# Patient Record
Sex: Male | Born: 1965 | Race: Black or African American | Hispanic: No | Marital: Married | Smoking: Former smoker
Health system: Southern US, Community
[De-identification: ages and names within clinical notes are randomized; demographics above are authoritative.]

## PROBLEM LIST (undated history)

## (undated) DIAGNOSIS — E785 Hyperlipidemia, unspecified: Secondary | ICD-10-CM

## (undated) DIAGNOSIS — I480 Paroxysmal atrial fibrillation: Secondary | ICD-10-CM

## (undated) DIAGNOSIS — I10 Essential (primary) hypertension: Secondary | ICD-10-CM

## (undated) DIAGNOSIS — G4733 Obstructive sleep apnea (adult) (pediatric): Secondary | ICD-10-CM

## (undated) DIAGNOSIS — E669 Obesity, unspecified: Secondary | ICD-10-CM

## (undated) DIAGNOSIS — N529 Male erectile dysfunction, unspecified: Secondary | ICD-10-CM

## (undated) DIAGNOSIS — E119 Type 2 diabetes mellitus without complications: Secondary | ICD-10-CM

## (undated) HISTORY — DX: Essential (primary) hypertension: I10

## (undated) HISTORY — DX: Type 2 diabetes mellitus without complications: E11.9

## (undated) HISTORY — PX: CARDIAC CATHETERIZATION: SHX172

## (undated) HISTORY — DX: Hyperlipidemia, unspecified: E78.5

## (undated) HISTORY — DX: Obstructive sleep apnea (adult) (pediatric): G47.33

## (undated) HISTORY — DX: Male erectile dysfunction, unspecified: N52.9

## (undated) HISTORY — PX: FINGER SURGERY: SHX640

## (undated) HISTORY — DX: Obesity, unspecified: E66.9

## (undated) HISTORY — DX: Paroxysmal atrial fibrillation: I48.0

---

## 1998-08-17 ENCOUNTER — Encounter: Admission: RE | Admit: 1998-08-17 | Discharge: 1998-11-15 | Payer: Self-pay

## 1999-06-20 ENCOUNTER — Inpatient Hospital Stay (HOSPITAL_COMMUNITY): Admission: EM | Admit: 1999-06-20 | Discharge: 1999-06-20 | Payer: Self-pay | Admitting: Emergency Medicine

## 1999-06-20 ENCOUNTER — Encounter: Payer: Self-pay | Admitting: Emergency Medicine

## 2004-08-13 ENCOUNTER — Ambulatory Visit: Payer: Self-pay | Admitting: Cardiology

## 2004-08-20 ENCOUNTER — Ambulatory Visit: Payer: Self-pay | Admitting: Cardiology

## 2004-09-02 ENCOUNTER — Ambulatory Visit: Payer: Self-pay | Admitting: Cardiology

## 2004-12-09 ENCOUNTER — Ambulatory Visit: Payer: Self-pay | Admitting: Cardiology

## 2005-04-15 ENCOUNTER — Ambulatory Visit: Payer: Self-pay | Admitting: Cardiology

## 2005-10-08 ENCOUNTER — Ambulatory Visit: Payer: Self-pay | Admitting: Cardiology

## 2005-10-15 ENCOUNTER — Ambulatory Visit: Payer: Self-pay | Admitting: Cardiology

## 2005-12-03 ENCOUNTER — Ambulatory Visit: Payer: Self-pay | Admitting: Cardiology

## 2006-06-01 ENCOUNTER — Ambulatory Visit: Payer: Self-pay | Admitting: Cardiology

## 2007-01-11 ENCOUNTER — Ambulatory Visit: Payer: Self-pay | Admitting: Cardiology

## 2007-01-12 ENCOUNTER — Ambulatory Visit: Payer: Self-pay | Admitting: Cardiology

## 2007-01-12 LAB — CONVERTED CEMR LAB
BUN: 19 mg/dL (ref 6–23)
CO2: 29 meq/L (ref 19–32)
Calcium: 9.1 mg/dL (ref 8.4–10.5)
Chloride: 109 meq/L (ref 96–112)
Creatinine, Ser: 0.8 mg/dL (ref 0.4–1.5)
Digitoxin Lvl: 0.3 ng/mL — ABNORMAL LOW (ref 0.8–2.0)
GFR calc Af Amer: 136 mL/min
GFR calc non Af Amer: 112 mL/min
Glucose, Bld: 104 mg/dL — ABNORMAL HIGH (ref 70–99)
Potassium: 4.1 meq/L (ref 3.5–5.1)
Sodium: 143 meq/L (ref 135–145)

## 2007-06-28 ENCOUNTER — Ambulatory Visit: Payer: Self-pay | Admitting: Cardiology

## 2008-02-23 ENCOUNTER — Ambulatory Visit: Payer: Self-pay | Admitting: Cardiology

## 2008-08-29 DIAGNOSIS — I4891 Unspecified atrial fibrillation: Secondary | ICD-10-CM

## 2008-08-29 DIAGNOSIS — E119 Type 2 diabetes mellitus without complications: Secondary | ICD-10-CM | POA: Insufficient documentation

## 2008-08-29 DIAGNOSIS — G4730 Sleep apnea, unspecified: Secondary | ICD-10-CM

## 2008-08-29 DIAGNOSIS — I10 Essential (primary) hypertension: Secondary | ICD-10-CM | POA: Insufficient documentation

## 2008-08-29 DIAGNOSIS — E785 Hyperlipidemia, unspecified: Secondary | ICD-10-CM

## 2008-08-29 DIAGNOSIS — I480 Paroxysmal atrial fibrillation: Secondary | ICD-10-CM | POA: Insufficient documentation

## 2008-08-29 DIAGNOSIS — E1169 Type 2 diabetes mellitus with other specified complication: Secondary | ICD-10-CM | POA: Insufficient documentation

## 2008-08-30 ENCOUNTER — Ambulatory Visit: Payer: Self-pay | Admitting: Cardiology

## 2008-08-30 ENCOUNTER — Encounter: Payer: Self-pay | Admitting: Cardiology

## 2009-03-21 ENCOUNTER — Ambulatory Visit: Payer: Self-pay | Admitting: Cardiology

## 2009-05-17 ENCOUNTER — Encounter (INDEPENDENT_AMBULATORY_CARE_PROVIDER_SITE_OTHER): Payer: Self-pay | Admitting: *Deleted

## 2009-07-30 ENCOUNTER — Ambulatory Visit: Payer: Self-pay | Admitting: Cardiovascular Disease

## 2009-07-30 ENCOUNTER — Telehealth: Payer: Self-pay | Admitting: Cardiology

## 2009-08-03 ENCOUNTER — Ambulatory Visit: Payer: Self-pay | Admitting: Cardiovascular Disease

## 2009-08-03 ENCOUNTER — Encounter: Payer: Self-pay | Admitting: Cardiology

## 2009-08-03 ENCOUNTER — Inpatient Hospital Stay (HOSPITAL_COMMUNITY): Admission: AD | Admit: 2009-08-03 | Discharge: 2009-08-09 | Payer: Self-pay | Admitting: Cardiology

## 2009-08-03 ENCOUNTER — Ambulatory Visit: Payer: Self-pay | Admitting: Cardiology

## 2009-08-06 ENCOUNTER — Encounter: Payer: Self-pay | Admitting: Cardiology

## 2009-08-10 ENCOUNTER — Ambulatory Visit: Payer: Self-pay | Admitting: Cardiovascular Disease

## 2009-08-10 LAB — CONVERTED CEMR LAB: POC INR: 3.2

## 2009-08-15 ENCOUNTER — Ambulatory Visit: Payer: Self-pay | Admitting: Internal Medicine

## 2009-08-15 LAB — CONVERTED CEMR LAB: POC INR: 3

## 2009-08-22 ENCOUNTER — Ambulatory Visit: Payer: Self-pay | Admitting: Cardiovascular Disease

## 2009-08-22 LAB — CONVERTED CEMR LAB: POC INR: 2.1

## 2009-09-05 ENCOUNTER — Ambulatory Visit: Payer: Self-pay | Admitting: Internal Medicine

## 2009-09-05 LAB — CONVERTED CEMR LAB: POC INR: 2.3

## 2009-09-13 ENCOUNTER — Ambulatory Visit: Payer: Self-pay | Admitting: Cardiology

## 2009-09-17 LAB — CONVERTED CEMR LAB
BUN: 25 mg/dL — ABNORMAL HIGH (ref 6–23)
CO2: 27 meq/L (ref 19–32)
Calcium: 9.4 mg/dL (ref 8.4–10.5)
Chloride: 105 meq/L (ref 96–112)
Creatinine, Ser: 1.1 mg/dL (ref 0.4–1.5)
GFR calc non Af Amer: 92.65 mL/min (ref >60)
Glucose, Bld: 98 mg/dL (ref 70–99)
Potassium: 4.2 meq/L (ref 3.5–5.1)
Sodium: 139 meq/L (ref 135–145)

## 2009-09-26 ENCOUNTER — Ambulatory Visit: Payer: Self-pay | Admitting: Cardiology

## 2009-09-26 LAB — CONVERTED CEMR LAB: POC INR: 2.3

## 2009-10-17 ENCOUNTER — Ambulatory Visit: Payer: Self-pay | Admitting: Internal Medicine

## 2009-10-17 DIAGNOSIS — R0602 Shortness of breath: Secondary | ICD-10-CM | POA: Insufficient documentation

## 2009-10-23 ENCOUNTER — Encounter: Payer: Self-pay | Admitting: Cardiovascular Disease

## 2009-10-25 ENCOUNTER — Telehealth (INDEPENDENT_AMBULATORY_CARE_PROVIDER_SITE_OTHER): Payer: Self-pay | Admitting: *Deleted

## 2009-10-29 ENCOUNTER — Ambulatory Visit: Payer: Self-pay | Admitting: Cardiology

## 2009-10-29 ENCOUNTER — Encounter (HOSPITAL_COMMUNITY): Admission: RE | Admit: 2009-10-29 | Discharge: 2009-12-25 | Payer: Self-pay | Admitting: Internal Medicine

## 2009-10-29 ENCOUNTER — Encounter: Payer: Self-pay | Admitting: Internal Medicine

## 2009-10-29 ENCOUNTER — Ambulatory Visit: Payer: Self-pay

## 2009-10-29 ENCOUNTER — Ambulatory Visit: Payer: Self-pay | Admitting: Internal Medicine

## 2009-10-29 LAB — CONVERTED CEMR LAB: POC INR: 2

## 2009-12-03 ENCOUNTER — Ambulatory Visit: Payer: Self-pay | Admitting: Internal Medicine

## 2009-12-03 LAB — CONVERTED CEMR LAB: POC INR: 1.6

## 2009-12-24 ENCOUNTER — Ambulatory Visit: Payer: Self-pay | Admitting: Cardiovascular Disease

## 2009-12-24 LAB — CONVERTED CEMR LAB: POC INR: 2.1

## 2010-01-11 ENCOUNTER — Ambulatory Visit: Payer: Self-pay | Admitting: Cardiology

## 2010-01-21 ENCOUNTER — Ambulatory Visit: Payer: Self-pay | Admitting: Cardiology

## 2010-01-21 LAB — CONVERTED CEMR LAB: POC INR: 1.4

## 2010-01-30 ENCOUNTER — Ambulatory Visit: Payer: Self-pay

## 2010-01-30 ENCOUNTER — Ambulatory Visit: Payer: Self-pay | Admitting: Internal Medicine

## 2010-02-04 ENCOUNTER — Ambulatory Visit: Payer: Self-pay | Admitting: Internal Medicine

## 2010-02-04 LAB — CONVERTED CEMR LAB: POC INR: 3.6

## 2010-02-25 ENCOUNTER — Ambulatory Visit: Payer: Self-pay | Admitting: Internal Medicine

## 2010-02-25 LAB — CONVERTED CEMR LAB: POC INR: 2.9

## 2010-03-25 ENCOUNTER — Ambulatory Visit: Payer: Self-pay | Admitting: Cardiology

## 2010-03-25 LAB — CONVERTED CEMR LAB: POC INR: 2.5

## 2010-03-26 ENCOUNTER — Telehealth (INDEPENDENT_AMBULATORY_CARE_PROVIDER_SITE_OTHER): Payer: Self-pay | Admitting: *Deleted

## 2010-03-27 ENCOUNTER — Encounter: Payer: Self-pay | Admitting: Cardiology

## 2010-04-09 ENCOUNTER — Encounter: Payer: Self-pay | Admitting: Internal Medicine

## 2010-04-09 ENCOUNTER — Ambulatory Visit: Payer: Self-pay | Admitting: Cardiology

## 2010-04-09 DIAGNOSIS — Z87448 Personal history of other diseases of urinary system: Secondary | ICD-10-CM | POA: Insufficient documentation

## 2010-04-22 ENCOUNTER — Ambulatory Visit: Payer: Self-pay | Admitting: Cardiology

## 2010-04-22 LAB — CONVERTED CEMR LAB
INR: 2.3
POC INR: 2.3

## 2010-05-03 ENCOUNTER — Ambulatory Visit: Payer: Self-pay | Admitting: Internal Medicine

## 2010-05-21 ENCOUNTER — Ambulatory Visit: Payer: Self-pay | Admitting: Cardiology

## 2010-05-22 LAB — CONVERTED CEMR LAB: POC INR: 1.5

## 2010-06-13 ENCOUNTER — Ambulatory Visit: Admission: RE | Admit: 2010-06-13 | Discharge: 2010-06-13 | Payer: Self-pay | Source: Home / Self Care

## 2010-06-13 LAB — CONVERTED CEMR LAB: POC INR: 3

## 2010-06-25 NOTE — Medication Information (Signed)
Summary: rov/tm  Anticoagulant Therapy  Managed by: Weston Brass, PharmD Referring MD: J.Allred/T.Riley Kill PCP: [facility] Supervising MD: Antoine Poche MD, Fayrene Fearing Indication 1: Atrial Fibrillation Lab Used: [facility] [place] Site: [address] INR POC 2.0 INR RANGE 2.0-3.0  Dietary changes: no    Health status changes: no    Bleeding/hemorrhagic complications: no    Recent/future hospitalizations: no    Any changes in medication regimen? no    Recent/future dental: no  Any missed doses?: no       Is patient compliant with meds? yes       Allergies: No Known Drug Allergies  Anticoagulation Management History:      The patient is taking warfarin and comes in today for a routine follow up visit.  Positive risk factors for bleeding include presence of serious comorbidities.  Negative risk factors for bleeding include an age less than 50 years old.  The bleeding index is 'intermediate risk'.  Positive CHADS2 values include History of HTN and History of Diabetes.  Negative CHADS2 values include Age > 31 years old.  Anticoagulation responsible provider: Antoine Poche MD, Fayrene Fearing.  INR POC: 2.0.  Cuvette Lot#: [id number].  Exp: 12/2010.    Anticoagulation Management Assessment/Plan:      The patient's current anticoagulation dose is Warfarin sodium 10 mg tabs: Take as directed by coumadin clinic..  The target INR is 2.0-3.0.  The next INR is due 11/28/2009.  Anticoagulation instructions were given to patient.  Results were reviewed/authorized by Weston Brass, PharmD.  He was notified by Weston Brass PharmD.         Prior Anticoagulation Instructions: INR 2.3 Continue 10mg  everyday except 7.5mg  on Wednesdays. Recheck in 4 weeks.l   Current Anticoagulation Instructions: INR 2.0  Continue same dose of 10mg  every day except 7.5mg  on Wednesday

## 2010-06-25 NOTE — Medication Information (Signed)
Summary: rov/ewj  Anticoagulant Therapy  Managed by: Geoffry Paradise, PharmD Referring MD: Hillis Range, MD PCP: [facility] Supervising MD: Riley Kill MD, Maisie Fus Indication 1: Atrial Fibrillation Lab Used: [facility] [place] Site: [address] INR POC 2.3 INR RANGE 2.0-3.0  Dietary changes: no    Health status changes: no    Bleeding/hemorrhagic complications: no    Recent/future hospitalizations: no    Any changes in medication regimen? no    Recent/future dental: no  Any missed doses?: no       Is patient compliant with meds? yes       Allergies: No Known Drug Allergies  Anticoagulation Management History:      Positive risk factors for bleeding include presence of serious comorbidities.  Negative risk factors for bleeding include an age less than 81 years old.  The bleeding index is 'intermediate risk'.  Positive CHADS2 values include History of HTN and History of Diabetes.  Negative CHADS2 values include Age > 31 years old.  Today's INR is 2.3.  Anticoagulation responsible provider: Riley Kill MD, Maisie Fus.  INR POC: 2.3.  Cuvette Lot#: [id number].  Exp: 04/2011.    Anticoagulation Management Assessment/Plan:      The patient's current anticoagulation dose is Warfarin sodium 10 mg tabs: Take as directed by coumadin clinic..  The target INR is 2.0-3.0.  The next INR is due 05/21/2010.  Anticoagulation instructions were given to patient.  Results were reviewed/authorized by Geoffry Paradise, PharmD.         Prior Anticoagulation Instructions: INR 2.5  Continue on same dosage 10mg  daily.  Recheck in 4 weeks.   Current Anticoagulation Instructions: INR: 2.3  Your INR is within goal today.  Please continue to take 10 mg (1 tablet) every day and return for another INR check in 4 weeks.

## 2010-06-25 NOTE — Medication Information (Signed)
Summary: rov/tm  Anticoagulant Therapy  Managed by: Bethena Midget, RN, BSN Referring MD: Hillis Range, MD PCP: [facility] Supervising MD: Tenny Craw MD, Gunnar Fusi Indication 1: Atrial Fibrillation Lab Used: [facility] [place] Site: [address] INR POC 3.6 INR RANGE 2.0-3.0  Dietary changes: no    Health status changes: no    Bleeding/hemorrhagic complications: no    Recent/future hospitalizations: no    Any changes in medication regimen? no    Recent/future dental: no  Any missed doses?: no       Is patient compliant with meds? yes      Comments: Pt did admitted that prior to  last visit he did miss some doses.  Allergies: No Known Drug Allergies  Anticoagulation Management History:      The patient is taking warfarin and comes in today for a routine follow up visit.  Positive risk factors for bleeding include presence of serious comorbidities.  Negative risk factors for bleeding include an age less than 35 years old.  The bleeding index is 'intermediate risk'.  Positive CHADS2 values include History of HTN and History of Diabetes.  Negative CHADS2 values include Age > 69 years old.  Anticoagulation responsible provider: Tenny Craw MD, Gunnar Fusi.  INR POC: 3.6.  Cuvette Lot#: [id number].  Exp: 03/2011.    Anticoagulation Management Assessment/Plan:      The patient's current anticoagulation dose is Warfarin sodium 10 mg tabs: Take as directed by coumadin clinic..  The target INR is 2.0-3.0.  The next INR is due 02/25/2010.  Anticoagulation instructions were given to patient.  Results were reviewed/authorized by Bethena Midget, RN, BSN.  He was notified by Harrel Carina, PharmD candidate.         Prior Anticoagulation Instructions: INR 1.4 Today take 1.5 pills then Change dose to 1pill everyday except 1.5 pills on Wednesdays. Recheck in 2 weeks.   Current Anticoagulation Instructions: INR 3.6  Today take 1/2 pill then change dose to 1 pill everyday. Recheck in 3 weeks.

## 2010-06-25 NOTE — Medication Information (Signed)
Summary: rov/ewj  Anticoagulant Therapy  Managed by: Cloyde Reams, RN, BSN Referring MD: Hillis Range, MD PCP: [facility] Supervising MD: Eden Emms MD, Theron Arista Indication 1: Atrial Fibrillation Lab Used: [facility] [place] Site: [address] INR POC 2.1 INR RANGE 2.0-3.0   Health status changes: no    Bleeding/hemorrhagic complications: no    Recent/future hospitalizations: no    Any changes in medication regimen? no    Recent/future dental: no  Any missed doses?: no       Is patient compliant with meds? yes       Allergies: No Known Drug Allergies  Anticoagulation Management History:      The patient is taking warfarin and comes in today for a routine follow up visit.  Positive risk factors for bleeding include presence of serious comorbidities.  Negative risk factors for bleeding include an age less than 51 years old.  The bleeding index is 'intermediate risk'.  Positive CHADS2 values include History of HTN and History of Diabetes.  Negative CHADS2 values include Age > 71 years old.  Anticoagulation responsible provider: Eden Emms MD, Theron Arista.  INR POC: 2.1.  Cuvette Lot#: [id number].  Exp: 02/2011.    Anticoagulation Management Assessment/Plan:      The patient's current anticoagulation dose is Warfarin sodium 10 mg tabs: Take as directed by coumadin clinic..  The target INR is 2.0-3.0.  The next INR is due 01/21/2010.  Anticoagulation instructions were given to patient.  Results were reviewed/authorized by Cloyde Reams, RN, BSN.  He was notified by Cloyde Reams RN.         Prior Anticoagulation Instructions: INR 2.0  Continue same dose of 10mg  every day except 7.5mg  on Wednesday  Current Anticoagulation Instructions: INR 2.1  Continue on same dosage 10mg  daily.  Recheck in 4 weeks.

## 2010-06-25 NOTE — Letter (Signed)
Summary: [employer] Certification   [employer] Certification   Imported By: Roderic Ovens 12/07/2009 12:41:37  _____________________________________________________________________  External Attachment:    Type:   Image     Comment:   External Document

## 2010-06-25 NOTE — Progress Notes (Signed)
Summary: Hematuria  Phone Note Call from Patient Call back at [facility] Phone [phone]   Summary of Call: Returned call concerning blood in urine.  Today pt has experience dark urine initially but now with what appears to be blood.  Pt has already taken his coumadin for the evening.  Denies any SOB, dizziness, or DOE.  Also denies any pain with urination.  Asked pt to call coumadin clinic in am for rechech INR/CBC, even though yesterday INR was therapuetic.  Pt agrees.  If patient begins to display any syptomas as listed above he will report to ER.   Initial call taken by: Robbi Garter NP-PA,  March 26, 2010 10:18 PM     Appended Document: Hematuria Called patient to check up on status.  He went to [facility] and had a UA done.  This revealed a UTI.  He got treated and it has resolved.  I recommended he get a followup UA in next few days with his primary to make sure there is not microcopic change.  Patient voiced understanding of principles. TS

## 2010-06-25 NOTE — Assessment & Plan Note (Signed)
Summary: PER CHECK OUT/SF   Visit Type:  Follow-up Referring Provider:  Dr Riley Kill Primary Provider:  [facility]   History of Present Illness: The patient presents today for routine electrophysiology followup. He reports doing very well since last being seen in our clinic. The patient denies symptoms of palpitations, chest pain, shortness of breath, orthopnea, PND, lower extremity edema, dizziness, presyncope, syncope, or neurologic sequela.  He feels that his exercise tolerance has improved.  The patient is tolerating medications without difficulties and is otherwise without complaint today.   Current Medications (verified): 1)  Multivitamins  Tabs (Multiple Vitamin) .... Take 1 Tablet By Mouth Once A Day 2)  Simvastatin 40 Mg Tabs (Simvastatin) .... Take One Tablet By Mouth Daily At Bedtime 3)  Aspirin Ec 325 Mg Tbec (Aspirin) .... Take One Tablet By Mouth Daily 4)  Metformin Hcl 500 Mg Xr24h-Tab (Metformin Hcl) .... Take Two Tablets Once Daily 5)  Metoprolol Succinate 100 Mg Xr24h-Tab (Metoprolol Succinate) .... Take One Tablet By Mouth Daily 6)  Flonase 50 Mcg/act Susp (Fluticasone Propionate) .... As Needed 7)  Viagra 50 Mg Tabs (Sildenafil Citrate) .... Take As Directed 8)  Digoxin 0.125 Mg Tabs (Digoxin) .... Take 1 Tablet By Mouth Once A Day 9)  Amlodipine Besylate 10 Mg Tabs (Amlodipine Besylate) .... Take 1 Tablet By Mouth Once A Day 10)  Warfarin Sodium 10 Mg Tabs (Warfarin Sodium) .... Take As Directed By Coumadin Clinic. 11)  Hydrochlorothiazide 12.5 Mg Caps (Hydrochlorothiazide) .... Take 1 Tablet By Mouth Once A Day 12)  Benazepril Hcl 20 Mg Tabs (Benazepril Hcl) .... Take 1 Tablet By Mouth Once A Day 13)  Flecainide Acetate 100 Mg Tabs (Flecainide Acetate) .... One By Mouth Bid  Allergies (verified): No Known Drug Allergies  Past History:  Past Medical History: HYPERCHOLESTEROLEMIA (ICD-272.0) SLEEP APNEA (ICD-780.57) HYPERTENSION, MODERATE (ICD-401.9) AODM  (ICD-250.00) ATRIAL FIBRILLATION, PAROXYSMAL  OBESITY, MORBID (ICD-278.01) Normal cath 2001 erectile dysfunction  Past Surgical History: Reviewed history from 08/29/2008 and no changes required. Left 1st. finger - 1989  Social History: Reviewed history from 10/17/2009 and no changes required. 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 Mouth:  Teeth, gums and palate normal. Oral mucosa normal. Neck:  Neck supple, no JVD. No masses, thyromegaly or abnormal cervical nodes. Lungs:  Clear bilaterally to auscultation and percussion. Heart:  Non-displaced PMI, chest non-tender; regular rate and rhythm, S1, S2 without murmurs, rubs or gallops. Carotid upstroke normal, no bruit. Normal abdominal aortic size, no bruits. Femorals normal pulses, no bruits. Pedals normal pulses. No edema, no varicosities. Abdomen:  Bowel sounds positive; abdomen soft and non-tender without masses, organomegaly, or hernias noted. No hepatosplenomegaly. Msk:  Back normal, normal gait. Muscle strength and tone normal. Pulses:  pulses normal in all 4 extremities Extremities:  No clubbing or cyanosis. Neurologic:  Alert and oriented x 3. Skin:  Intact without lesions or rashes. Cervical Nodes:  no significant adenopathy Psych:  Normal  affect.   EKG  Procedure date:  12/03/2009  Findings:      sinus rhythm 60 bpm, PR 186, QRS 94, Qt 410, otherwise normal ekg  Impression & Recommendations:  Problem # 1:  ATRIAL FIBRILLATION, PAROXYSMAL, CHRONIC (ICD-427.31) The patient has converted to  sinus rhythm with flecainide.  He is tolerating this medicine without difficulty.  His CHADS2 score  is 1.  We will continue coumadin at this time. We will obtain a GXT to evaluate for exercise induced arrhythmias.  Problem # 2:  HYPERTENSION, MODERATE (ICD-401.9) stable no changes today  Problem # 3:  OBESITY, MORBID (ICD-278.01) weight loss advised  Problem # 4:  SLEEP APNEA (ICD-780.57) compliance with CPAP advised  Other Orders: Treadmill (Treadmill)  Patient Instructions: 1)  Your physician recommends that you schedule a follow-up appointment in: treadmill with Dr Johney Frame in 6-8 weeks

## 2010-06-25 NOTE — Miscellaneous (Signed)
Summary: update med  Clinical Lists Changes  Medications: Added new medication of VIAGRA 50 MG TABS (SILDENAFIL CITRATE) Take as directed

## 2010-06-25 NOTE — Assessment & Plan Note (Signed)
Summary: afib/lwb   Visit Type:  Initial Consult Referring Provider:  Dr Riley Kill Primary Provider:  [facility]   History of Present Illness: Mr Daniel Grant is a pleasant 45 yo AAM with a h/o persistent atrial fibrillation and obesity who presents today for EP consultation.  He reports initially being diagnosed with atrial fibrillation1997 after presenting with palpitations and CP to [facility].  He was initiated on cardizem and digoxin and did well without further symptoms of afib until recently.  He reports that in early March 2011, he reports progressive symptoms of fatigue, palpitations, SOB, and decreased exercise tolerance.  He was admitted to [facility] and cardioverted to sinus rhythm.  He was not placed on an antiarrhythmic medicine.  Unfortunately, he returned to atrial fibrillation several weeks later.  He now presents for EP consultation regarding further therapies.  He is unaware of triggers/ precipitants for his afib.  He has sleep apnea and reports reasonable compliance with CPAP.  Current Medications (verified): 1)  Multivitamins  Tabs (Multiple Vitamin) .... Take 1 Tablet By Mouth Once A Day 2)  Simvastatin 40 Mg Tabs (Simvastatin) .... Take One Tablet By Mouth Daily At Bedtime 3)  Aspirin Ec 325 Mg Tbec (Aspirin) .... Take One Tablet By Mouth Daily 4)  Metformin Hcl 500 Mg Xr24h-Tab (Metformin Hcl) .... Take Two Tablets Once Daily 5)  Metoprolol Succinate 100 Mg Xr24h-Tab (Metoprolol Succinate) .... Take One Tablet By Mouth Daily 6)  Flonase 50 Mcg/act Susp (Fluticasone Propionate) .... As Needed 7)  Viagra 50 Mg Tabs (Sildenafil Citrate) .... Take As Directed 8)  Digoxin 0.125 Mg Tabs (Digoxin) .... Take 1 Tablet By Mouth Once A Day 9)  Amlodipine Besylate 10 Mg Tabs (Amlodipine Besylate) .... Take 1 Tablet By Mouth Once A Day 10)  Warfarin Sodium 10 Mg Tabs (Warfarin Sodium) .... Take As Directed By Coumadin Clinic. 11)  Hydrochlorothiazide 12.5 Mg Caps  (Hydrochlorothiazide) .... Take 1 Tablet By Mouth Once A Day 12)  Benazepril Hcl 20 Mg Tabs (Benazepril Hcl) .... Take 1 Tablet By Mouth Once A Day  Allergies (verified): No Known Drug Allergies  Past History:  Past Medical History: HYPERCHOLESTEROLEMIA (ICD-272.0) SLEEP APNEA (ICD-780.57) HYPERTENSION, MODERATE (ICD-401.9) AODM (ICD-250.00) ATRIAL FIBRILLATION, PAROXYSMAL, CHRONIC (ICD-427.31) OBESITY, MORBID (ICD-278.01) Normal cath 2001 erectile dysfunction  Past Surgical History: Reviewed history from 08/29/2008 and no changes required. Left 1st. finger - 1989  Family History: Reviewed history from 08/29/2008 and no changes required. His family history is remarkable for a grandmother who died with a myocardial infarctation and an uncle who died with an MI at age 15. His mother died at age 16 of a shotgun wound.. He does not have brothers and sisters.  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   Vital Signs:  Patient profile:   45 year old male Height:      70 inches Weight:      269 pounds BMI:     38.74 Pulse rate:   103 / minute BP sitting:   116 / 78  (left arm)  Vitals Entered By: Laurance Flatten CMA (Oct 17, 2009 2:57 PM)  Physical Exam  General:  obese, NAD Head:  normocephalic and atraumatic Eyes:  PERRLA/EOM intact; conjunctiva and lids normal. Nose:  no deformity, discharge, inflammation, or  lesions Mouth:  Teeth, gums and palate normal. Oral mucosa normal. Neck:  Neck supple, no JVD. No masses, thyromegaly or abnormal cervical nodes. Lungs:  Clear bilaterally to auscultation and percussion. Heart:  iRRR, no m/r/g Abdomen:  Bowel sounds positive; abdomen soft and non-tender without masses, organomegaly, or hernias noted. No hepatosplenomegaly. Msk:  Back normal, normal gait. Muscle strength and  tone normal. Pulses:  pulses normal in all 4 extremities Extremities:  No clubbing or cyanosis. Neurologic:  Alert and oriented x 3. Skin:  Intact without lesions or rashes. Cervical Nodes:  no significant adenopathy Psych:  Normal affect.   Echocardiogram  Procedure date:  08/06/2009  Findings:       Study Conclusions  Left ventricle: The cavity size was normal. There was mild  concentric hypertrophy. Systolic function was mildly reduced. The  estimated ejection fraction was 50%. - Aortic valve: There was no stenosis.  - Aorta: Normal caliber, minimal plaque.  - Mitral valve: Mild regurgitation.  - Left atrium: The atrium was mildly dilated. No evidence of    thrombus in the atrial cavity or appendage.  - Right ventricle: The cavity size was normal. Systolic function was  normal.  - Right atrium: The atrium was mildly dilated.   Impressions: May proceed with cardioversion.  Transesophageal echocardiography. 2D and color Doppler. Patient  status: Inpatient. Location: Endoscopy. Left ventricle: The cavity size was normal. There was mild  concentric hypertrophy. Systolic function was mildly reduced. The  estimated ejection fraction was 50%.  Aortic valve: Trileaflet. Doppler: There was no stenosis. No  regurgitation.  Aorta: Normal caliber, minimal plaque. Mitral valve:  Doppler: Mild regurgitation.  Left atrium: The atrium was mildly dilated. No evidence of thrombus  in the atrial cavity or appendage.  Right ventricle: The cavity size was normal. Systolic function was  normal.  Tricuspid valve:  Doppler: Mild regurgitation.  Right atrium: The atrium was mildly dilated.   Post procedure conclusions   Ascending Aorta:    - Normal caliber, minimal plaque.     Prepared and Electronically Authenticated by    Marca Ancona, MD        CXR  Procedure date:  08/03/2009  Findings:        Clinical Data: Atrial fibrillation, tachycardia and shortness of   breath.    CHEST - 2 VIEW     Comparison: None    Findings: Cardiomegaly and pulmonary vascular congestion   identified.   There is no evidence of focal airspace disease, pulmonary edema,   pleural effusion, or pneumothorax.   No acute bony abnormalities are identified.    IMPRESSION:   Cardiomegaly with pulmonary vascular congestion.    Read By:  Rosendo Gros,  M.D.   Released By:  Rosendo Gros,  M.D.     EKG  Procedure date:  10/17/2009  Findings:      afib,  V rate 103, nonspecific ST/T changes  Impression & Recommendations:  Problem # 1:  ATRIAL FIBRILLATION, PAROXYSMAL, CHRONIC (ICD-427.31) The patient has symptomatic persistent atrial fibrillation.  He has not tried an antiarrhythmic medicine.  Therapeutic strategies for afib including medicine and ablation were discussed in detail with the patient today. Presently, catheter ablation is considered to be a second line strategy for patients who have failed medical therpapy with antiarrhythmic medicines.  His INR has been consistently therapeutic.  We will therefore consider initiation of an antiarrhythmic medicine.  Given SOB and CAD risk factors, we will obtain a Lexiscan myoview.  If this is normal, we will start flecainide 100mg  two times a day.  If abnormal, we will consider hospitalization for initiation for sotalol.  We discussed pradaxa as an alternative to coumadin.  As he has done well with coumadin, we will not make the change at this point.  We will continue to consider pradaxa in the future.  Problem # 2:  SLEEP APNEA (ICD-780.57) CPAP encouraged  Problem # 3:  HYPERTENSION, MODERATE (ICD-401.9)  stable no changes  His updated medication list for this problem includes:    Aspirin Ec 325 Mg Tbec (Aspirin) .Marland Kitchen... Take one tablet by mouth daily    Metoprolol Succinate 100 Mg Xr24h-tab (Metoprolol succinate) .Marland Kitchen... Take one tablet by mouth daily    Amlodipine Besylate 10 Mg Tabs (Amlodipine besylate) .Marland Kitchen... Take 1 tablet by mouth once a day     Hydrochlorothiazide 12.5 Mg Caps (Hydrochlorothiazide) .Marland Kitchen... Take 1 tablet by mouth once a day    Benazepril Hcl 20 Mg Tabs (Benazepril hcl) .Marland Kitchen... Take 1 tablet by mouth once a day  Problem # 4:  OBESITY, MORBID (ICD-278.01) weight loss encouraged  Other Orders: Nuclear Stress Test (Nuc Stress Test)  Patient Instructions: 1)  Your physician recommends that you schedule a follow-up appointment in: 6 weeks with Dr Johney Frame 2)  Your physician has requested that you have an lexiscan myoview.  For further information please visit [email or web address]  Please follow instruction sheet, as given.  Prevention & Chronic Care Immunizations   Influenza vaccine: Not documented    Tetanus booster: Not documented    Pneumococcal vaccine: Not documented  Other Screening   PSA: Not documented   Smoking status: Not documented  Diabetes Mellitus   HgbA1C: Not documented    Eye exam: Not documented    Foot exam: Not documented   High risk foot: Not documented   Foot care education: Not documented    Urine microalbumin/creatinine ratio: Not documented  Lipids   Total Cholesterol: Not documented   LDL: Not documented   LDL Direct: Not documented   HDL: Not documented   Triglycerides: Not documented    SGOT (AST): Not documented   SGPT (ALT): Not documented   Alkaline phosphatase: Not documented   Total bilirubin: Not documented  Hypertension   Last Blood Pressure: 116 / 78  (10/17/2009)   Serum creatinine: 1.1  (09/13/2009)   Serum potassium 4.2  (09/13/2009)  Self-Management Support :    Diabetes self-management support: Not documented    Hypertension self-management support: Not documented    Lipid self-management support: Not documented

## 2010-06-25 NOTE — Medication Information (Signed)
Summary: ccn/ gd  Anticoagulant Therapy  Managed by: Cloyde Reams, RN, BSN Referring MD: Dr Riley Kill PCP: [facility] Supervising MD: Clifton James MD, Cristal Deer Indication 1: Atrial Fibrillation Lab Used: [facility] Site: [address] INR POC 3.2 INR RANGE 2.0-3.0  Dietary changes: no    Health status changes: no    Bleeding/hemorrhagic complications: no    Recent/future hospitalizations: no    Any changes in medication regimen? no    Recent/future dental: no  Any missed doses?: no       Is patient compliant with meds? yes      Comments: New patient education done.  Patient was educated on importance of medication compliance and consistancy of vitamin K in the diet.  Pt educated on bleeding risks assoc with coumadin and advised to monitor for s+s of bleeding.  Educated on drug interactions while on coumadin therapy, pt aware to call with all medication changes or new meds started.    Current Medications (verified): 1)  Multivitamins  Tabs (Multiple Vitamin) .... Take 1 Tablet By Mouth Once A Day 2)  Simvastatin 40 Mg Tabs (Simvastatin) .... Take One Tablet By Mouth Daily At Bedtime 3)  Aspirin Ec 325 Mg Tbec (Aspirin) .... Take One Tablet By Mouth Daily 4)  Metformin Hcl 500 Mg Xr24h-Tab (Metformin Hcl) .... Take Two Tablets Once Daily 5)  Metoprolol Succinate 100 Mg Xr24h-Tab (Metoprolol Succinate) .... Take One Tablet By Mouth Daily 6)  Flonase 50 Mcg/act Susp (Fluticasone Propionate) .... As Needed 7)  Viagra 50 Mg Tabs (Sildenafil Citrate) .... Take As Directed 8)  Digoxin 0.125 Mg Tabs (Digoxin) .... Take 1 Tablet By Mouth Once A Day 9)  Amlodipine Besylate 10 Mg Tabs (Amlodipine Besylate) .... Take 1 Tablet By Mouth Once A Day 10)  Warfarin Sodium 10 Mg Tabs (Warfarin Sodium) .... Take As Directed By Coumadin Clinic. 11)  Hydrochlorothiazide 12.5 Mg Caps (Hydrochlorothiazide) .... Take 1 Tablet By Mouth Once A Day 12)  Benazepril Hcl 20 Mg  Tabs (Benazepril Hcl) .... Take 1 Tablet By Mouth Once A Day  Allergies (verified): No Known Drug Allergies  Anticoagulation Management History:      The patient is taking warfarin and comes in today for a routine follow up visit.  Positive risk factors for bleeding include presence of serious comorbidities.  Negative risk factors for bleeding include an age less than 87 years old.  The bleeding index is 'intermediate risk'.  Positive CHADS2 values include History of HTN and History of Diabetes.  Negative CHADS2 values include Age > 2 years old.  Anticoagulation responsible provider: Clifton James MD, Cristal Deer.  INR POC: 3.2.  Cuvette Lot#: [id number].  Exp: 09/2010.    Anticoagulation Management Assessment/Plan:      The patient's current anticoagulation dose is Warfarin sodium 10 mg tabs: Take as directed by coumadin clinic..  The target INR is 2.0-3.0.  The next INR is due 08/15/2009.  Results were reviewed/authorized by Cloyde Reams, RN, BSN.  He was notified by Cloyde Reams RN.         Current Anticoagulation Instructions: INR 3.2  Take 5mg  (1/2 tablet) then resume same dosage 10mg  daily.  Recheck on Wednesday.

## 2010-06-25 NOTE — Assessment & Plan Note (Signed)
Summary: f13m   Visit Type:  3 months follow up Referring Provider:  Dr Riley Kill Primary Provider:  [facility]  CC:  Blood in urine about 2 weeks ago.  History of Present Illness: Daniel Grant is very pleasent 45 year old man with PMH of CAD, PAfib on coumadin and Flecainamide, HTN, NIDDM and sleep apnea is here today for a regular follow up.  He was seen by Dr Johney Frame and got a stress test to look for induced arrythmias on Flecinamide whoch was normal. He is in sinus rhythm today and has been compliant with his meds.  His BP is well controlled today.  His obesity: Has gained 7 pounds in last 2 months and has been actively trying to loose weight.  NIDDM is well controlled on metformin. Due for Hba1c today.  He is compliant with his CPAP and has been working day shifts these days and has been feeling much better recently.  Had a UTI recently and we will follow up with a urine analysis and check for hematuria.  Compalins of tingling sensation on the right side of his thigh.  Problems Prior to Update: 1)  Hematuria, Hx of  (ICD-V13.09) 2)  Shortness of Breath  (ICD-786.05) 3)  Hypercholesterolemia  (ICD-272.0) 4)  Sleep Apnea  (ICD-780.57) 5)  Hypertension, Moderate  (ICD-401.9) 6)  Aodm  (ICD-250.00) 7)  Atrial Fibrillation, Paroxysmal, Chronic  (ICD-427.31) 8)  Obesity, Morbid  (ICD-278.01)  Current Medications (verified): 1)  Multivitamins  Tabs (Multiple Vitamin) .... Take 1 Tablet By Mouth Once A Day 2)  Simvastatin 20 Mg Tabs (Simvastatin) .... Take One Tablet By Mouth Daily At Bedtime 3)  Aspirin 81 Mg Tbec (Aspirin) .... Take One Tablet By Mouth Daily 4)  Metformin Hcl 500 Mg Xr24h-Tab (Metformin Hcl) .... Take Two Tablets Once Daily 5)  Metoprolol Succinate 100 Mg Xr24h-Tab (Metoprolol Succinate) .... Take One Tablet By Mouth Daily 6)  Flonase 50 Mcg/act Susp (Fluticasone Propionate) .... As Needed 7)  Viagra 50 Mg Tabs (Sildenafil Citrate) .... Take As Directed 8)   Digoxin 0.125 Mg Tabs (Digoxin) .... Take 1 Tablet By Mouth Once A Day 9)  Amlodipine Besylate 10 Mg Tabs (Amlodipine Besylate) .... Take 1 Tablet By Mouth Once A Day 10)  Warfarin Sodium 10 Mg Tabs (Warfarin Sodium) .... Take As Directed By Coumadin Clinic. 11)  Hydrochlorothiazide 12.5 Mg Caps (Hydrochlorothiazide) .... Take 1 Tablet By Mouth Once A Day 12)  Benazepril Hcl 20 Mg Tabs (Benazepril Hcl) .... Take 1 Tablet By Mouth Once A Day 13)  Flecainide Acetate 100 Mg Tabs (Flecainide Acetate) .... One By Mouth Bid  Allergies (verified): No Known Drug Allergies  Family History: Reviewed history from 08/29/2008 and no changes required. His family history is remarkable for a grandmother who died with a myocardial infarctation and an uncle who died with an MI at age 22. His mother died at age 57 of a shotgun wound.. He does not have brothers and sisters.  Social History: Reviewed history from 10/17/2009 and no changes required. 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 [place] Alcohol Use - no Drug Use - no Tob- none  Review of Systems      See HPI  Vital Signs:  Patient profile:   45 year old male Height:      70 inches Weight:      273.50 pounds BMI:     39.39 Pulse rate:   56 / minute Pulse rhythm:   regular Resp:  18 per minute BP sitting:   120 / 82  (right arm) Cuff size:   large  Vitals Entered By: [name] (April 09, 2010 4:19 PM)  Physical Exam  Additional Exam:  Gen: AOx3, in no acute distress Eyes: PERRL, EOMI ENT:MMM, No erythema noted in posterior pharynx Neck: No JVD, No LAP Chest: CTAB with  good respiratory effort CVS: regular rhythmic rate, NO M/R/G, S1 S2 normal Abdo: soft,ND, BS+x4, Non tender and No hepatosplenomegaly EXT: No odema noted Neuro: Non focal, gait is normal Skin: no rashes noted.    EKG  Procedure date:  04/09/2010  Findings:      NSR, 56/min unchanged from last EKG Changes of early repolarization. No change  from tracing of August 19  Impression & Recommendations:  Problem # 1:  HYPERTENSION, MODERATE (ICD-401.9) Assessment Unchanged  At goal.  His updated medication list for this problem includes:    Aspirin 81 Mg Tbec (Aspirin) .Marland Kitchen... Take one tablet by mouth daily    Metoprolol Succinate 100 Mg Xr24h-tab (Metoprolol succinate) .Marland Kitchen... Take one tablet by mouth daily    Amlodipine Besylate 10 Mg Tabs (Amlodipine besylate) .Marland Kitchen... Take 1 tablet by mouth once a day    Hydrochlorothiazide 12.5 Mg Caps (Hydrochlorothiazide) .Marland Kitchen... Take 1 tablet by mouth once a day    Benazepril Hcl 20 Mg Tabs (Benazepril hcl) .Marland Kitchen... Take 1 tablet by mouth once a day  His updated medication list for this problem includes:    Aspirin 81 Mg Tbec (Aspirin) .Marland Kitchen... Take one tablet by mouth daily    Metoprolol Succinate 100 Mg Xr24h-tab (Metoprolol succinate) .Marland Kitchen... Take one tablet by mouth daily    Amlodipine Besylate 10 Mg Tabs (Amlodipine besylate) .Marland Kitchen... Take 1 tablet by mouth once a day    Hydrochlorothiazide 12.5 Mg Caps (Hydrochlorothiazide) .Marland Kitchen... Take 1 tablet by mouth once a day    Benazepril Hcl 20 Mg Tabs (Benazepril hcl) .Marland Kitchen... Take 1 tablet by mouth once a day  Problem # 2:  HEMATURIA, HX OF (ICD-V13.09) Assessment: Improved Treated for UTI at urgent care center. Will cehck repeat UA for microscopic hematuria and UTI.  Problem # 3:  AODM (ICD-250.00) Assessment: Unchanged  Will check HBa1c.  His updated medication list for this problem includes:    Aspirin 81 Mg Tbec (Aspirin) .Marland Kitchen... Take one tablet by mouth daily    Metformin Hcl 500 Mg Xr24h-tab (Metformin hcl) .Marland Kitchen... Take two tablets once daily    Benazepril Hcl 20 Mg Tabs (Benazepril hcl) .Marland Kitchen... Take 1 tablet by mouth once a day  His updated medication list for this problem includes:    Aspirin 81 Mg Tbec (Aspirin) .Marland Kitchen... Take one tablet by mouth daily    Metformin Hcl 500 Mg Xr24h-tab (Metformin hcl) .Marland Kitchen... Take two tablets once daily    Benazepril Hcl 20  Mg Tabs (Benazepril hcl) .Marland Kitchen... Take 1 tablet by mouth once a day  Problem # 4:  OBESITY, MORBID (ICD-278.01) Assessment: Deteriorated Encouraged to work on his weight.  Problem # 5:  ATRIAL FIBRILLATION, PAROXYSMAL, CHRONIC (ICD-427.31) Continue current meds and coumadin. rate controlled.  His updated medication list for this problem includes:    Aspirin 81 Mg Tbec (Aspirin) .Marland Kitchen... Take one tablet by mouth daily    Metoprolol Succinate 100 Mg Xr24h-tab (Metoprolol succinate) .Marland Kitchen... Take one tablet by mouth daily    Digoxin 0.125 Mg Tabs (Digoxin) .Marland Kitchen... Take 1 tablet by mouth once a day    Warfarin Sodium 10 Mg Tabs (Warfarin sodium) .Marland Kitchen... Take  as directed by coumadin clinic.    Flecainide Acetate 100 Mg Tabs (Flecainide acetate) ..... One by mouth bid  Patient Instructions: 1)  Your physician recommends that you schedule a follow-up appointment in: 6 months 2)  Your physician recommends that you return for lab work in:--go to [facility] for hemoglobinA1c--urinalysis for c and s Prescriptions: FLECAINIDE ACETATE 100 MG TABS (FLECAINIDE ACETATE) one by mouth bid  #60 Each x 10   Entered by:   Ledon Snare, RN   Authorized by:   Ronaldo Miyamoto, MD, [facility]   Signed by:   Ledon Snare, RN on 04/09/2010   Method used:   Electronically to        [facility] (retail)       [address]       [place], [place]  [address]       Ph: [phone]       Fax: [phone]   RxID:   [id number] BENAZEPRIL HCL 20 MG TABS (BENAZEPRIL HCL) Take 1 tablet by mouth once a day  #90 x 3   Entered by:   Ledon Snare, RN   Authorized by:   Ronaldo Miyamoto, MD, [facility]   Signed by:   Ledon Snare, RN on 04/09/2010   Method used:   Electronically to        [facility] (retail)       [address]       [place], [place]  [address]       Ph: [phone]       Fax: [phone]   RxID:   [id number] HYDROCHLOROTHIAZIDE 12.5 MG  CAPS (HYDROCHLOROTHIAZIDE) Take 1 tablet by mouth once a day  #90 x 3   Entered by:   Ledon Snare, RN   Authorized by:   Ronaldo Miyamoto, MD, [facility]   Signed by:   Ledon Snare, RN on 04/09/2010   Method used:   Electronically to        [facility] (retail)       [address]       [place], [place]  [address]       Ph: [phone]       Fax: [phone]   RxID:   [id number] AMLODIPINE BESYLATE 10 MG TABS (AMLODIPINE BESYLATE) Take 1 tablet by mouth once a day  #90 x 3   Entered by:   Ledon Snare, RN   Authorized by:   Ronaldo Miyamoto, MD, [facility]   Signed by:   Ledon Snare, RN on 04/09/2010   Method used:   Electronically to        [facility] (retail)       [address]       [place], [place]  [address]       Ph: [phone]       Fax: [phone]   RxID:   [id number] DIGOXIN 0.125 MG TABS (DIGOXIN) Take 1 tablet by mouth once a day  #90 x 3   Entered by:   Ledon Snare, RN   Authorized by:   Ronaldo Miyamoto, MD, [facility]   Signed by:   Ledon Snare, RN on 04/09/2010   Method used:   Electronically to        [facility] (retail)       [address]       [place], [place]  [address]       Ph: [phone]       Fax: [phone]   RxID:   [id number] METOPROLOL SUCCINATE 100 MG XR24H-TAB (METOPROLOL SUCCINATE) Take one tablet by mouth daily  #90 x 3   Entered by:   Ledon Snare, RN   Authorized by:   Ronaldo Miyamoto, MD, [facility]   Signed by:   Ledon Snare, RN on 04/09/2010   Method used:   Electronically to        [facility] (retail)       [address]       [place], [place]  [address]       Ph: [phone]       Fax: [phone]   RxID:   [id number] SIMVASTATIN 20 MG TABS (SIMVASTATIN) Take one tablet by mouth daily at bedtime  #90 x 3   Entered by:   Ledon Snare, RN   Authorized by:   Ronaldo Miyamoto, MD, [facility]   Signed by:   Ledon Snare, RN on 04/09/2010   Method used:   Electronically to        [facility] (retail)       [address]       [place], [place]  [address]       Ph: [phone]       Fax: [phone]   RxID:   [id number]  The pt went to [facility] after appt with Dr Riley Kill and had a Urinalysis rechecked.  No comment about bacteria.  The pt will call [facility] to see if he has bacteria in his 04/09/10 urine specimen.  03/27/10 HgbA1c 6.2%. [name], RN, BSN  April 11, 2010 4:32 PM

## 2010-06-25 NOTE — Assessment & Plan Note (Signed)
Summary: f/u afib   Visit Type:  Follow-up Referring Provider:  Dr Riley Kill Primary Provider:  [facility]  CC:  atrial fib.  History of Present Illness: Patient has been using sleep apnea machine, and his symptoms of fatigue are improved, and BP control better.  Still in atrial fibrillation, and saw Dr. Johney Frame and discussed possible options for atrial fib.  First line therapy is medical. Cath was normal in 2001.  Current Medications (verified): 1)  Multivitamins  Tabs (Multiple Vitamin) .... Take 1 Tablet By Mouth Once A Day 2)  Simvastatin 40 Mg Tabs (Simvastatin) .... Take One Tablet By Mouth Daily At Bedtime 3)  Aspirin Ec 325 Mg Tbec (Aspirin) .... Take One Tablet By Mouth Daily 4)  Metformin Hcl 500 Mg Xr24h-Tab (Metformin Hcl) .... Take Two Tablets Once Daily 5)  Metoprolol Succinate 100 Mg Xr24h-Tab (Metoprolol Succinate) .... Take One Tablet By Mouth Daily 6)  Flonase 50 Mcg/act Susp (Fluticasone Propionate) .... As Needed 7)  Viagra 50 Mg Tabs (Sildenafil Citrate) .... Take As Directed 8)  Digoxin 0.125 Mg Tabs (Digoxin) .... Take 1 Tablet By Mouth Once A Day 9)  Amlodipine Besylate 10 Mg Tabs (Amlodipine Besylate) .... Take 1 Tablet By Mouth Once A Day 10)  Warfarin Sodium 10 Mg Tabs (Warfarin Sodium) .... Take As Directed By Coumadin Clinic. 11)  Hydrochlorothiazide 12.5 Mg Caps (Hydrochlorothiazide) .... Take 1 Tablet By Mouth Once A Day 12)  Benazepril Hcl 20 Mg Tabs (Benazepril Hcl) .... Take 1 Tablet By Mouth Once A Day  Allergies (verified): No Known Drug Allergies  Vital Signs:  Patient profile:   45 year old male Height:      70 inches Weight:      269.75 pounds BMI:     38.85 Pulse rate:   93 / minute Pulse rhythm:   irregular Resp:     18 per minute BP sitting:   108 / 70  (left arm) Cuff size:   large  Vitals Entered By: [name] (October 29, 2009 3:20 PM)  Physical Exam  General:  Well developed, well nourished, in no acute distress. Head:   normocephalic and atraumatic Eyes:  PERRLA/EOM intact; conjunctiva and lids normal. Lungs:  Clear bilaterally to auscultation and percussion. Heart:  Non-displaced PMI, chest non-tender; regular rate and rhythm, S1, S2 without murmurs, rubs or gallops. Carotid upstroke normal, no bruit. Normal abdominal aortic size, no bruits.  Extremities:  No clubbing or cyanosis. Neurologic:  Alert and oriented x 3.   Cardiac Cath  Procedure date:  10/29/1999  Findings:      FINDINGS: 1. Left main trunk:  This is a large caliber vessel which is angiographically    normal.  2. Left anterior descending artery:  This is large vessel that provides two medium    caliber diagonal branches in the proximal mid section and a smaller third    marginal branch distally.  The LAD is angiographically normal.  3. Left circumflex artery:  This is a medium caliber vessel that contains two    marginal branches.  The left circumflex system is angiographically normal.  4. Right coronary artery:  The right coronary artery is dominant and is a large  caliber vessel that provides a posterior descending artery as well as two    posteroventricular branches.  The right coronary system is angiographically    normal.  5. Left ventricle:  Normal end-systolic and end-diastolic dimensions.  Overall    left ventricular function is well  reserved.  Ejection fraction is greater than    55%.  No mitral regurgitation.  LV pressure is 151/10, aortic 151/86, LV EDP 23.   Cardiac Cath  Procedure date:  10/29/2009  Findings:      afib, with controlled ventricular response.  Nonspecific T flattening.   Impression & Recommendations:  Problem # 1:  ATRIAL FIBRILLATION, PAROXYSMAL, CHRONIC (ICD-427.31) Currently on warfarin with CHADS 2 score.  Has failed cardioversion, and getting nuc study to assess for drug treatment.  Awaiting this.  See cath report from 2001.  See Dr. Johney Frame notes.  His updated medication list for this  problem includes:    Aspirin Ec 325 Mg Tbec (Aspirin) .Marland Kitchen... Take one tablet by mouth daily    Metoprolol Succinate 100 Mg Xr24h-tab (Metoprolol succinate) .Marland Kitchen... Take one tablet by mouth daily    Digoxin 0.125 Mg Tabs (Digoxin) .Marland Kitchen... Take 1 tablet by mouth once a day    Warfarin Sodium 10 Mg Tabs (Warfarin sodium) .Marland Kitchen... Take as directed by coumadin clinic.  Orders: EKG w/ Interpretation (93000)  Problem # 2:  SLEEP APNEA (ICD-780.57) CPAP has helped alot, and he is using regularly.  Also is getting more rest by not doing as much work.    Problem # 3:  HYPERTENSION, MODERATE (ICD-401.9) BP has been better controlled since starting on CPAP.   His updated medication list for this problem includes:    Aspirin Ec 325 Mg Tbec (Aspirin) .Marland Kitchen... Take one tablet by mouth daily    Metoprolol Succinate 100 Mg Xr24h-tab (Metoprolol succinate) .Marland Kitchen... Take one tablet by mouth daily    Amlodipine Besylate 10 Mg Tabs (Amlodipine besylate) .Marland Kitchen... Take 1 tablet by mouth once a day    Hydrochlorothiazide 12.5 Mg Caps (Hydrochlorothiazide) .Marland Kitchen... Take 1 tablet by mouth once a day    Benazepril Hcl 20 Mg Tabs (Benazepril hcl) .Marland Kitchen... Take 1 tablet by mouth once a day  Patient Instructions: 1)  Your physician recommends that you schedule a follow-up appointment in: 2 MONTHS 2)  Your physician recommends that you continue on your current medications as directed. Please refer to the Current Medication list given to you today.

## 2010-06-25 NOTE — Assessment & Plan Note (Signed)
Summary: sob and palpitations   Visit Type:  Follow-up  CC:  Sob and palpitations.  History of Present Illness: 45 year old male with Type II DM, HTN, and PAF called in because of fatigue, exertional dyspnea, and palpitations. He has been feeling bad for the past week. Denies chest pain, edema, orthopnea, or PND. He has a difficult lifestyle, works multiple jobs, and doesn't follow a prudent diet, also drinks lots of caffeine.  Current Medications (verified): 1)  Multivitamins  Tabs (Multiple Vitamin) .... Take 1 Tablet By Mouth Once A Day 2)  Benazepril Hcl 20 Mg Tabs (Benazepril Hcl) .... Take 1 Tablet By Mouth Once A Day 3)  Simvastatin 40 Mg Tabs (Simvastatin) .... Take One Tablet By Mouth Daily At Bedtime 4)  Aspirin Ec 325 Mg Tbec (Aspirin) .... Take One Tablet By Mouth Daily 5)  Metformin Hcl 500 Mg Xr24h-Tab (Metformin Hcl) .... Take Two Tablets Once Daily 6)  Metoprolol Succinate 50 Mg Xr24h-Tab (Metoprolol Succinate) .... Take One Tablet By Mouth Daily 7)  Flonase 50 Mcg/act Susp (Fluticasone Propionate) .... As Needed 8)  Viagra 50 Mg Tabs (Sildenafil Citrate) .... Take As Directed  Allergies (verified): No Known Drug Allergies  Past History:  Past medical history reviewed for relevance to current acute and chronic problems.  Past Medical History: Reviewed history from 08/29/2008 and no changes required. HYPERCHOLESTEROLEMIA (ICD-272.0) SLEEP APNEA (ICD-780.57) HYPERTENSION, MODERATE (ICD-401.9) AODM (ICD-250.00) ATRIAL FIBRILLATION, PAROXYSMAL, CHRONIC (ICD-427.31) OBESITY, MORBID (ICD-278.01)  Review of Systems       Negative except as per HPI   Vital Signs:  Patient profile:   45 year old male Height:      70 inches Weight:      289.50 pounds BMI:     41.69 Pulse rate:   140 / minute Pulse rhythm:   irregular Resp:     20 per minute BP sitting:   134 / 98  (left arm) Cuff size:   large  Vitals Entered By: [name] (July 30, 2009 4:06  PM)  Physical Exam  General:  Pt is alert and oriented, very pleasant, obese male, in no acute distress. HR down to 100 on my exam. HEENT: normal Neck: normal carotid upstrokes without bruits, JVP normal Lungs: CTA CV: tachy, irregularly irregular without murmur or gallop Abd: soft, NT, positive BS, no bruit, no organomegaly Ext: no clubbing, cyanosis, or edema. peripheral pulses 2+ and equal Skin: warm and dry without rash    EKG  Procedure date:  07/30/2009  Findings:      AF with RVR, nonspecific T wave abnormality, HR 140 bpm  Impression & Recommendations:  Problem # 1:  ATRIAL FIBRILLATION, PAROXYSMAL, CHRONIC (ICD-427.31) Will increase his Toprol to 100 mg daily and resume digoxin. Continue ASA for now, but might consider coumadin in future with diabetes and hypertension. I'm not sure about pt's level of compliance and whether he is a good coumadin candidate. Will defer to Dr Riley Kill.  Will bring the pt back this Friday for a nurse visit and EKG to be done by Lauren.  His updated medication list for this problem includes:    Aspirin Ec 325 Mg Tbec (Aspirin) .Marland Kitchen... Take one tablet by mouth daily    Metoprolol Succinate 100 Mg Xr24h-tab (Metoprolol succinate) .Marland Kitchen... Take one tablet by mouth daily    Digoxin 0.125 Mg Tabs (Digoxin) .Marland Kitchen... Take a half  tablet by mouth daily  Orders: EKG w/ Interpretation (93000)  Problem # 2:  HYPERTENSION, MODERATE (ICD-401.9)  As above, increase Metoprolol succ. for better rate-control.  His updated medication list for this problem includes:    Benazepril Hcl 20 Mg Tabs (Benazepril hcl) .Marland Kitchen... Take 1 tablet by mouth once a day    Aspirin Ec 325 Mg Tbec (Aspirin) .Marland Kitchen... Take one tablet by mouth daily    Metoprolol Succinate 100 Mg Xr24h-tab (Metoprolol succinate) .Marland Kitchen... Take one tablet by mouth daily  Orders: EKG w/ Interpretation (93000)  Patient Instructions: 1)  Your physician recommends that you schedule a follow-up appointment on  08/03/09 at 9:00 for an EKG with Lauren. 2)  Your physician has recommended you make the following change in your medication: INCREASE Metoprolol Succinate to 100mg  once a day, RESTART Digoxin 0.125mg  one-half tablet by mouth daily  Appended Document: sob and palpitations Reviewed.  Discussed.  TS

## 2010-06-25 NOTE — Medication Information (Signed)
Summary: Daniel Grant  Anticoagulant Therapy  Managed by: Bethena Midget, RN, BSN Referring MD: Hillis Range, MD PCP: [facility] Supervising MD: Riley Kill MD, Maisie Fus Indication 1: Atrial Fibrillation Lab Used: [facility] [place] Site: [address] INR POC 1.4 INR RANGE 2.0-3.0  Dietary changes: yes       Details: Ate less green leafy veggies  Health status changes: no    Bleeding/hemorrhagic complications: no    Recent/future hospitalizations: no    Any changes in medication regimen? yes       Details: Simvastatin decresed  to 20mg s and ASA to 81mg s at last OV on 01/11/10  Recent/future dental: no  Any missed doses?: no       Is patient compliant with meds? yes       Allergies: No Known Drug Allergies  Anticoagulation Management History:      The patient is taking warfarin and comes in today for a routine follow up visit.  Positive risk factors for bleeding include presence of serious comorbidities.  Negative risk factors for bleeding include an age less than 69 years old.  The bleeding index is 'intermediate risk'.  Positive CHADS2 values include History of HTN and History of Diabetes.  Negative CHADS2 values include Age > 50 years old.  Anticoagulation responsible provider: Riley Kill MD, Maisie Fus.  INR POC: 1.4.  Cuvette Lot#: [id number].  Exp: 02/2011.    Anticoagulation Management Assessment/Plan:      The patient's current anticoagulation dose is Warfarin sodium 10 mg tabs: Take as directed by coumadin clinic..  The target INR is 2.0-3.0.  The next INR is due 02/04/2010.  Anticoagulation instructions were given to patient.  Results were reviewed/authorized by Bethena Midget, RN, BSN.  He was notified by Bethena Midget, RN, BSN.         Prior Anticoagulation Instructions: INR 2.1  Continue on same dosage 10mg  daily.  Recheck in 4 weeks.    Current Anticoagulation Instructions: INR 1.4 Today take 1.5 pills then Change dose to 1pill everyday except 1.5 pills on  Wednesdays. Recheck in 2 weeks.

## 2010-06-25 NOTE — Medication Information (Signed)
Summary: rov/sp  Anticoagulant Therapy  Managed by: Cloyde Reams, RN, BSN Referring MD: Hillis Range, MD PCP: [facility] Supervising MD: Tenny Craw MD, Gunnar Fusi Indication 1: Atrial Fibrillation Lab Used: [facility] [place] Site: [address] INR POC 1.6 INR RANGE 2.0-3.0  Dietary changes: no    Health status changes: no    Bleeding/hemorrhagic complications: no    Recent/future hospitalizations: no    Any changes in medication regimen? no    Recent/future dental: no  Any missed doses?: no       Is patient compliant with meds? yes       Allergies: No Known Drug Allergies  Anticoagulation Management History:      The patient is taking warfarin and comes in today for a routine follow up visit.  Positive risk factors for bleeding include presence of serious comorbidities.  Negative risk factors for bleeding include an age less than 5 years old.  The bleeding index is 'intermediate risk'.  Positive CHADS2 values include History of HTN and History of Diabetes.  Negative CHADS2 values include Age > 19 years old.  Anticoagulation responsible provider: Tenny Craw MD, Gunnar Fusi.  INR POC: 1.6.  Cuvette Lot#: [id number].  Exp: 02/2011.    Anticoagulation Management Assessment/Plan:      The patient's current anticoagulation dose is Warfarin sodium 10 mg tabs: Take as directed by coumadin clinic..  The target INR is 2.0-3.0.  The next INR is due 12/24/2009.  Anticoagulation instructions were given to patient.  Results were reviewed/authorized by Cloyde Reams, RN, BSN.  He was notified by Cloyde Reams RN.         Prior Anticoagulation Instructions: INR 2.0  Continue same dose of 10mg  every day except 7.5mg  on Wednesday  Current Anticoagulation Instructions: INR 1.6  Take 1.5 tablets today, then start taking 1 tablet daily.  Recheck in 3 weeks.

## 2010-06-25 NOTE — Medication Information (Signed)
Summary: rov/ewj  Anticoagulant Therapy  Managed by: Bethena Midget, RN, BSN Referring MD: Dr Riley Kill PCP: [facility] Supervising MD: Jens Som MD, Arlys John Indication 1: Atrial Fibrillation Lab Used: [facility] [place] Site: [address] INR POC 2.3 INR RANGE 2.0-3.0  Dietary changes: no    Health status changes: no    Bleeding/hemorrhagic complications: no    Recent/future hospitalizations: no    Any changes in medication regimen? no    Recent/future dental: no  Any missed doses?: no       Is patient compliant with meds? yes       Allergies: No Known Drug Allergies  Anticoagulation Management History:      The patient is taking warfarin and comes in today for a routine follow up visit.  Positive risk factors for bleeding include presence of serious comorbidities.  Negative risk factors for bleeding include an age less than 67 years old.  The bleeding index is 'intermediate risk'.  Positive CHADS2 values include History of HTN and History of Diabetes.  Negative CHADS2 values include Age > 34 years old.  Anticoagulation responsible provider: Jens Som MD, Arlys John.  INR POC: 2.3.  Cuvette Lot#: [id number].  Exp: 10/2010.    Anticoagulation Management Assessment/Plan:      The patient's current anticoagulation dose is Warfarin sodium 10 mg tabs: Take as directed by coumadin clinic..  The target INR is 2.0-3.0.  The next INR is due 10/29/2009.  Anticoagulation instructions were given to patient.  Results were reviewed/authorized by Bethena Midget, RN, BSN.  He was notified by Bethena Midget, RN, BSN.         Prior Anticoagulation Instructions: INR 2.3  Continue on same dosage 10mg  daily except 7.5mg  on Wednesdays.  Recheck in 3 weeks.    Current Anticoagulation Instructions: INR 2.3 Continue 10mg  everyday except 7.5mg  on Wednesdays. Recheck in 4 weeks.l

## 2010-06-25 NOTE — Assessment & Plan Note (Signed)
Summary: eph   Visit Type:  Post-hospital Primary Provider:  [facility]  CC:  No complains since pt. was d/c from hospital.  History of Present Illness: Overall doing well.  Did not know he was back in atrial fibrillation.  He is tolerating, and his coumadin has stabilized.  Denies any chest pain.  Now wearing his mask at present.   Current Medications (verified): 1)  Multivitamins  Tabs (Multiple Vitamin) .... Take 1 Tablet By Mouth Once A Day 2)  Simvastatin 40 Mg Tabs (Simvastatin) .... Take One Tablet By Mouth Daily At Bedtime 3)  Aspirin Ec 325 Mg Tbec (Aspirin) .... Take One Tablet By Mouth Daily 4)  Metformin Hcl 500 Mg Xr24h-Tab (Metformin Hcl) .... Take Two Tablets Once Daily 5)  Metoprolol Succinate 100 Mg Xr24h-Tab (Metoprolol Succinate) .... Take One Tablet By Mouth Daily 6)  Flonase 50 Mcg/act Susp (Fluticasone Propionate) .... As Needed 7)  Viagra 50 Mg Tabs (Sildenafil Citrate) .... Take As Directed 8)  Digoxin 0.125 Mg Tabs (Digoxin) .... Take 1 Tablet By Mouth Once A Day 9)  Amlodipine Besylate 10 Mg Tabs (Amlodipine Besylate) .... Take 1 Tablet By Mouth Once A Day 10)  Warfarin Sodium 10 Mg Tabs (Warfarin Sodium) .... Take As Directed By Coumadin Clinic. 11)  Hydrochlorothiazide 12.5 Mg Caps (Hydrochlorothiazide) .... Take 1 Tablet By Mouth Once A Day 12)  Benazepril Hcl 20 Mg Tabs (Benazepril Hcl) .... Take 1 Tablet By Mouth Once A Day  Allergies (verified): No Known Drug Allergies  Vital Signs:  Patient profile:   45 year old male Height:      70 inches Weight:      273.75 pounds BMI:     39.42 Pulse rate:   98 / minute Pulse rhythm:   irregular Resp:     18 per minute BP sitting:   120 / 76  (left arm) Cuff size:   large  Vitals Entered By: [name] (September 13, 2009 11:26 AM)  Physical Exam  General:  Well developed, well nourished, in no acute distress. Head:  normocephalic and atraumatic Eyes:  PERRLA/EOM intact; conjunctiva and lids  normal. Lungs:  Clear bilaterally to auscultation and percussion. Heart:  irregularly, irregular rhythm.  Msk:  Back normal, normal gait. Muscle strength and tone normal. Extremities:  No clubbing or cyanosis. Neurologic:  Alert and oriented x 3.   TE Echocardiogram  Procedure date:  08/06/2009  Findings:       Study Conclusions    - Left ventricle: The cavity size was normal. There was mild     concentric hypertrophy. Systolic function was mildly reduced. The     estimated ejection fraction was 50%.   - Aortic valve: There was no stenosis.   - Aorta: Normal caliber, minimal plaque.   - Mitral valve: Mild regurgitation.   - Left atrium: The atrium was mildly dilated. No evidence of     thrombus in the atrial cavity or appendage.   - Right ventricle: The cavity size was normal. Systolic function was     normal.   - Right atrium: The atrium was mildly dilated.   Impressions:    - May proceed with cardioversion.  CXR  Procedure date:  08/03/2009  Findings:       Comparison: None    Findings: Cardiomegaly and pulmonary vascular congestion   identified.   There is no evidence of focal airspace disease, pulmonary edema,   pleural effusion, or pneumothorax.   No acute bony  abnormalities are identified.    IMPRESSION:   Cardiomegaly with pulmonary vascular congestion.   EKG  Procedure date:  09/13/2009  Findings:      atrial fib, moderate control of ventriuclar rate.   Impression & Recommendations:  Problem # 1:  ATRIAL FIBRILLATION, PAROXYSMAL, CHRONIC (ICD-427.31)  back in atrial fibrillation after cardioversion.  Guidelines would call for medical therpay, but he is young, with long duration of need given CHADS of 2.  Rate is under control, if not ideal.  Will continue current medications, and see back after seen by Dr. Johney Frame for management suggestions.   Has no ischemic symptoms, but no ischemic workup to date.  His updated medication list for this problem  includes:    Aspirin Ec 325 Mg Tbec (Aspirin) .Marland Kitchen... Take one tablet by mouth daily    Metoprolol Succinate 100 Mg Xr24h-tab (Metoprolol succinate) .Marland Kitchen... Take one tablet by mouth daily    Digoxin 0.125 Mg Tabs (Digoxin) .Marland Kitchen... Take 1 tablet by mouth once a day    Warfarin Sodium 10 Mg Tabs (Warfarin sodium) .Marland Kitchen... Take as directed by coumadin clinic.  Orders: EKG w/ Interpretation (93000) EP Referral (Cardiology EP Ref ) TLB-BMP (Basic Metabolic Panel-BMET) (80048-METABOL)  Problem # 2:  SLEEP APNEA (ICD-780.57) now wearing mask.  Importance emphasized .  Problem # 3:  HYPERTENSION, MODERATE (ICD-401.9)  better control at present.  His updated medication list for this problem includes:    Aspirin Ec 325 Mg Tbec (Aspirin) .Marland Kitchen... Take one tablet by mouth daily    Metoprolol Succinate 100 Mg Xr24h-tab (Metoprolol succinate) .Marland Kitchen... Take one tablet by mouth daily    Amlodipine Besylate 10 Mg Tabs (Amlodipine besylate) .Marland Kitchen... Take 1 tablet by mouth once a day    Hydrochlorothiazide 12.5 Mg Caps (Hydrochlorothiazide) .Marland Kitchen... Take 1 tablet by mouth once a day    Benazepril Hcl 20 Mg Tabs (Benazepril hcl) .Marland Kitchen... Take 1 tablet by mouth once a day  His updated medication list for this problem includes:    Aspirin Ec 325 Mg Tbec (Aspirin) .Marland Kitchen... Take one tablet by mouth daily    Metoprolol Succinate 100 Mg Xr24h-tab (Metoprolol succinate) .Marland Kitchen... Take one tablet by mouth daily    Amlodipine Besylate 10 Mg Tabs (Amlodipine besylate) .Marland Kitchen... Take 1 tablet by mouth once a day    Hydrochlorothiazide 12.5 Mg Caps (Hydrochlorothiazide) .Marland Kitchen... Take 1 tablet by mouth once a day    Benazepril Hcl 20 Mg Tabs (Benazepril hcl) .Marland Kitchen... Take 1 tablet by mouth once a day  Orders: EKG w/ Interpretation (93000) TLB-BMP (Basic Metabolic Panel-BMET) (80048-METABOL)  Patient Instructions: 1)  Your physician recommends that you schedule a follow-up appointment in: 6 WEEEKS 2)  Your physician recommends that you have lab work  today: BMP 3)  Your physician recommends that you continue on your current medications as directed. Please refer to the Current Medication list given to you today. 4)  You have been referred to Dr Johney Frame for Atrial Fibrillation.

## 2010-06-25 NOTE — Assessment & Plan Note (Signed)
Summary: afib with RVR   Visit Type:  Follow-up  CC:  SOB and symptoms have not changed since Monday.  History of Present Illness: 45 year old male with Type II DM, HTN, and PAF who was seen Monday, March 7th for atrial fib with RVR. His AV nodal blocking agents were increasede and he presented today for folow-up evaluation. He continues to complain of exertional dyspnea and palpitations. Feels ok at rest. Denies chest pain, edema, orthopnea, or PND. Has no other complaints.  Current Medications (verified): 1)  Multivitamins  Tabs (Multiple Vitamin) .... Take 1 Tablet By Mouth Once A Day 2)  Simvastatin 40 Mg Tabs (Simvastatin) .... Take One Tablet By Mouth Daily At Bedtime 3)  Aspirin Ec 325 Mg Tbec (Aspirin) .... Take One Tablet By Mouth Daily 4)  Metformin Hcl 500 Mg Xr24h-Tab (Metformin Hcl) .... Take Two Tablets Once Daily 5)  Metoprolol Succinate 100 Mg Xr24h-Tab (Metoprolol Succinate) .... Take One Tablet By Mouth Daily 6)  Flonase 50 Mcg/act Susp (Fluticasone Propionate) .... As Needed 7)  Viagra 50 Mg Tabs (Sildenafil Citrate) .... Take As Directed 8)  Digoxin 0.25 Mg Tabs (Digoxin) .... Take One-Half Tablet By Mouth Daily  Allergies: No Known Drug Allergies  Past History:  Past medical, surgical, family and social histories (including risk factors) reviewed, and no changes noted (except as noted below).  Past Medical History: Reviewed history from 08/29/2008 and no changes required. HYPERCHOLESTEROLEMIA (ICD-272.0) SLEEP APNEA (ICD-780.57) HYPERTENSION, MODERATE (ICD-401.9) AODM (ICD-250.00) ATRIAL FIBRILLATION, PAROXYSMAL, CHRONIC (ICD-427.31) OBESITY, MORBID (ICD-278.01)  Past Surgical History: Reviewed history from 08/29/2008 and no changes required. Left 1st. finger - 1989  Family History: Reviewed history from 08/29/2008 and no changes required. His family history is remarkable for a grandmother who died with a myocardial infarctation and an uncle who died  with an MI at age 52. His mother died at age 64 of a shotgun wound.. He does not have brothers and sisters.  Social History: Reviewed history from 08/29/2008 and no changes required. Pt. drives a truck at night Married  Alcohol Use - no Drug Use - no  Review of Systems       Negative except as per HPI   Vital Signs:  Patient profile:   45 year old male Weight:      289 pounds Pulse rate:   145 / minute Resp:     16 per minute BP sitting:   164 / 90  (left arm) Cuff size:   large  Vitals Entered By: [name], RN, BSN (August 03, 2009 9:54 AM)  Physical Exam  General:  Pt is alert and oriented, obese, African-American male, in no acute distress. HEENT: normal Neck: normal carotid upstrokes without bruits, JVP normal Lungs: CTA CV: tachy, irregularly irregular without murmur or gallop Abd: soft, NT, positive BS, no bruit, no organomegaly Ext: no clubbing, cyanosis, or edema. peripheral pulses 2+ and equal Skin: warm and dry without rash Neuro: CNII-XII intact, strength intact =    EKG  Procedure date:  08/03/2009  Findings:      Atrial fib with RVR, nonspecific ST-T changes, HR 145 bpm  Impression & Recommendations:  Problem # 1:  ATRIAL FIBRILLATION, PAROXYSMAL, CHRONIC (ICD-427.31) Pt continues to have a rapid ventricular rate, despite doubling of his beta-blocker and addition of digoxin. I think he will be very difficult to treat as an outpatient. I have recommended hospital admission for escalation of AV nodal blockade and planned TEE/Cardioversion on Monday after he is anticoagulated  with Heparin and coumadin over the weekend. His CHADs score is 2 and he should begin coumadin for long-term use.  His updated medication list for this problem includes:    Aspirin Ec 325 Mg Tbec (Aspirin) .Marland Kitchen... Take one tablet by mouth daily    Metoprolol Succinate 100 Mg Xr24h-tab (Metoprolol succinate) .Marland Kitchen... Take one tablet by mouth daily    Digoxin 0.25 Mg Tabs (Digoxin)  .Marland Kitchen... Take one-half tablet by mouth daily  Problem # 2:  HYPERTENSION, MODERATE (ICD-401.9) He has not been taking Benazepril, will resume. Will double Toprol to 100 mg two times a day, and continue digoxin.  His updated medication list for this problem includes:    Aspirin Ec 325 Mg Tbec (Aspirin) .Marland Kitchen... Take one tablet by mouth daily    Metoprolol Succinate 100 Mg Xr24h-tab (Metoprolol succinate) .Marland Kitchen... Take one tablet by mouth daily  Problem # 3:  SLEEP APNEA (ICD-780.57) He should take his CPAP with him to the hospital and continue same settings.

## 2010-06-25 NOTE — Medication Information (Signed)
Summary: rov/tm  Anticoagulant Therapy  Managed by: Cloyde Reams, RN, BSN Referring MD: Dr Riley Kill PCP: [facility] Supervising MD: Johney Frame MD, Fayrene Fearing Indication 1: Atrial Fibrillation Lab Used: [facility] [place] Site: [address] INR POC 2.3 INR RANGE 2.0-3.0  Dietary changes: no    Health status changes: no    Bleeding/hemorrhagic complications: no    Recent/future hospitalizations: no    Any changes in medication regimen? no    Recent/future dental: no  Any missed doses?: no       Is patient compliant with meds? yes       Allergies: No Known Drug Allergies  Anticoagulation Management History:      The patient is taking warfarin and comes in today for a routine follow up visit.  Positive risk factors for bleeding include presence of serious comorbidities.  Negative risk factors for bleeding include an age less than 65 years old.  The bleeding index is 'intermediate risk'.  Positive CHADS2 values include History of HTN and History of Diabetes.  Negative CHADS2 values include Age > 32 years old.  Anticoagulation responsible provider: Jlyn Bracamonte MD, Fayrene Fearing.  INR POC: 2.3.  Cuvette Lot#: [id number].  Exp: 09/2010.    Anticoagulation Management Assessment/Plan:      The patient's current anticoagulation dose is Warfarin sodium 10 mg tabs: Take as directed by coumadin clinic..  The target INR is 2.0-3.0.  The next INR is due 09/26/2009.  Anticoagulation instructions were given to patient.  Results were reviewed/authorized by Cloyde Reams, RN, BSN.  He was notified by Cloyde Reams RN.         Prior Anticoagulation Instructions: INR 2.1 Change dose to 3/4 pill on Wednesday and all other days take 1 pill. Recheck in 2 weeks.   Current Anticoagulation Instructions: INR 2.3  Continue on same dosage 10mg  daily except 7.5mg  on Wednesdays.  Recheck in 3 weeks.

## 2010-06-25 NOTE — Medication Information (Signed)
Summary: rov/tm  Anticoagulant Therapy  Managed by: Cloyde Reams, RN, BSN Referring MD: Hillis Range, MD PCP: [facility] Supervising MD: Shirlee Latch MD, Anallely Rosell Indication 1: Atrial Fibrillation Lab Used: [facility] [place] Site: [address] INR POC 2.5 INR RANGE 2.0-3.0  Dietary changes: no    Health status changes: no    Bleeding/hemorrhagic complications: no    Recent/future hospitalizations: no    Any changes in medication regimen? no    Recent/future dental: no  Any missed doses?: no       Is patient compliant with meds? yes       Allergies: No Known Drug Allergies  Anticoagulation Management History:      The patient is taking warfarin and comes in today for a routine follow up visit.  Positive risk factors for bleeding include presence of serious comorbidities.  Negative risk factors for bleeding include an age less than 48 years old.  The bleeding index is 'intermediate risk'.  Positive CHADS2 values include History of HTN and History of Diabetes.  Negative CHADS2 values include Age > 42 years old.  Anticoagulation responsible provider: Shirlee Latch MD, Nakiyah Beverley.  INR POC: 2.5.  Cuvette Lot#: [id number].  Exp: 04/2011.    Anticoagulation Management Assessment/Plan:      The patient's current anticoagulation dose is Warfarin sodium 10 mg tabs: Take as directed by coumadin clinic..  The target INR is 2.0-3.0.  The next INR is due 04/22/2010.  Anticoagulation instructions were given to patient.  Results were reviewed/authorized by Cloyde Reams, RN, BSN.  He was notified by Cloyde Reams RN.         Prior Anticoagulation Instructions: INR 2.9 Continue 10mg s everyday.  Recheck in 4 weeks.   Current Anticoagulation Instructions: INR 2.5  Continue on same dosage 10mg  daily.  Recheck in 4 weeks.

## 2010-06-25 NOTE — Assessment & Plan Note (Signed)
Summary: AFIB  Nurse Visit   Vital Signs:  Patient profile:   45 year old male Weight:      289 pounds Pulse rate:   145 / minute BP sitting:   164 / 90  (left arm) Cuff size:   large  Vitals Entered By: [name], RN, BSN (August 03, 2009 9:10 AM)  Impression & Recommendations:  Problem # 1:  ATRIAL FIBRILLATION, PAROXYSMAL, CHRONIC (ICD-427.31) EKG obtained---pt in AFIB with RVR, rate 145.  The pt c/o SOB.  I spoke with Dr Riley Kill by phone and he recommended that the pt be admitted.  I will have the pt see Dr Excell Seltzer in the office.   I reviewed the pt's medication list with him and he has not been taking Benazepril 20mg  daily. The pt also had Digoxin tablets at home and he has been taking one-half tablet daily.     CC:  shortness of breath and no change in symptoms since Monday.    Current Medications (verified): 1)  Multivitamins  Tabs (Multiple Vitamin) .... Take 1 Tablet By Mouth Once A Day 2)  Simvastatin 40 Mg Tabs (Simvastatin) .... Take One Tablet By Mouth Daily At Bedtime 3)  Aspirin Ec 325 Mg Tbec (Aspirin) .... Take One Tablet By Mouth Daily 4)  Metformin Hcl 500 Mg Xr24h-Tab (Metformin Hcl) .... Take Two Tablets Once Daily 5)  Metoprolol Succinate 100 Mg Xr24h-Tab (Metoprolol Succinate) .... Take One Tablet By Mouth Daily 6)  Flonase 50 Mcg/act Susp (Fluticasone Propionate) .... As Needed 7)  Viagra 50 Mg Tabs (Sildenafil Citrate) .... Take As Directed 8)  Digoxin 0.25 Mg Tabs (Digoxin) .... Take One-Half Tablet By Mouth Daily  Allergies: No Known Drug Allergies

## 2010-06-25 NOTE — Medication Information (Signed)
Summary: rov/tm  Anticoagulant Therapy  Managed by: Bethena Midget, RN, BSN Referring MD: Hillis Range, MD PCP: [facility] Supervising MD: Gala Romney MD, Klay Sobotka Indication 1: Atrial Fibrillation Lab Used: [facility] [place] Site: [address] INR POC 2.9 INR RANGE 2.0-3.0  Dietary changes: no    Health status changes: no    Bleeding/hemorrhagic complications: no    Recent/future hospitalizations: no    Any changes in medication regimen? no    Recent/future dental: no  Any missed doses?: no       Is patient compliant with meds? yes       Allergies: No Known Drug Allergies  Anticoagulation Management History:      The patient is taking warfarin and comes in today for a routine follow up visit.  Positive risk factors for bleeding include presence of serious comorbidities.  Negative risk factors for bleeding include an age less than 74 years old.  The bleeding index is 'intermediate risk'.  Positive CHADS2 values include History of HTN and History of Diabetes.  Negative CHADS2 values include Age > 54 years old.  Anticoagulation responsible provider: Hubbard Seldon MD, Reuel Boom.  INR POC: 2.9.  Cuvette Lot#: [id number].  Exp: 03/2011.    Anticoagulation Management Assessment/Plan:      The patient's current anticoagulation dose is Warfarin sodium 10 mg tabs: Take as directed by coumadin clinic..  The target INR is 2.0-3.0.  The next INR is due 03/25/2010.  Anticoagulation instructions were given to patient.  Results were reviewed/authorized by Bethena Midget, RN, BSN.  He was notified by Bethena Midget, RN, BSN.         Prior Anticoagulation Instructions: INR 3.6  Today take 1/2 pill then change dose to 1 pill everyday. Recheck in 3 weeks.   Current Anticoagulation Instructions: INR 2.9 Continue 10mg s everyday.  Recheck in 4 weeks.

## 2010-06-25 NOTE — Progress Notes (Signed)
Summary: Nuclear Pre-Procedure  Phone Note Outgoing Call   Call placed by: Milana Na, EMT-P,  October 25, 2009 2:45 PM Summary of Call: Reviewed information on [employer] Information Sheet (see scanned document for further details).  Spoke with patient's daughter.     Nuclear Med Background Indications for Stress Test: Evaluation for Ischemia  Indications Comments: To initiate antiarrhythmic Rx  History: Cardioversion, Echo, Heart Catheterization  History Comments: '01 Heart Cath Nl. 03/11 Cardioversion 03/11 ECHO EF 50% mild MR Chronic AFIB  Symptoms: Fatigue, Palpitations, SOB    Nuclear Pre-Procedure Cardiac Risk Factors: Hypertension, NIDDM Height (in): 70  Nuclear Med Study Referring MD:  J.Allred/T.Riley Kill

## 2010-06-25 NOTE — Medication Information (Signed)
Summary: rov/ewj  Anticoagulant Therapy  Managed by: Cloyde Reams, RN, BSN Referring MD: Dr Riley Kill PCP: [facility] Supervising MD: Johney Frame MD, Fayrene Fearing Indication 1: Atrial Fibrillation Lab Used: [facility] [place] Site: [address] INR POC 3.0 INR RANGE 2.0-3.0  Dietary changes: no    Health status changes: no    Bleeding/hemorrhagic complications: no    Recent/future hospitalizations: no    Any changes in medication regimen? no    Recent/future dental: no  Any missed doses?: no       Is patient compliant with meds? yes       Allergies (verified): No Known Drug Allergies  Anticoagulation Management History:      The patient is taking warfarin and comes in today for a routine follow up visit.  Positive risk factors for bleeding include presence of serious comorbidities.  Negative risk factors for bleeding include an age less than 73 years old.  The bleeding index is 'intermediate risk'.  Positive CHADS2 values include History of HTN and History of Diabetes.  Negative CHADS2 values include Age > 3 years old.  Anticoagulation responsible provider: Leaman Abe MD, Fayrene Fearing.  INR POC: 3.0.  Cuvette Lot#: [id number].  Exp: 09/2010.    Anticoagulation Management Assessment/Plan:      The patient's current anticoagulation dose is Warfarin sodium 10 mg tabs: Take as directed by coumadin clinic..  The target INR is 2.0-3.0.  The next INR is due 08/22/2009.  Results were reviewed/authorized by Cloyde Reams, RN, BSN.  He was notified by Cloyde Reams RN.         Prior Anticoagulation Instructions: INR 3.2  Take 5mg  (1/2 tablet) then resume same dosage 10mg  daily.  Recheck on Wednesday.    Current Anticoagulation Instructions: INR 3.0  Start taking 10mg  daily except 5mg  on Wednesdays.  Recheck in 1 week.

## 2010-06-25 NOTE — Assessment & Plan Note (Signed)
Summary: Daniel Grant  Medications Added BENAZEPRIL HCL 20 MG TABS (BENAZEPRIL HCL) Take 1 tablet by mouth once a day METOPROLOL SUCCINATE 50 MG XR24H-TAB (METOPROLOL SUCCINATE) Take one tablet by mouth daily METOPROLOL TARTRATE 25 MG TABS (METOPROLOL TARTRATE) Take 1 tablet by mouth once a day      Allergies Added: NKDA   Visit Type:  6 months follow up  CC:  No cardiac complains.  History of Present Illness: His BP has been high and his wife has been giving him more medications. Has been on a sleep apnea machine, but has not been using the machine.  Cannot get used to it.   Checks his sugars, but they have not been bad.    Anticoagulation Management History:      Positive risk factors for bleeding include presence of serious comorbidities.  Negative risk factors for bleeding include an age less than 73 years old.  The bleeding index is 'intermediate risk'.  Positive CHADS2 values include History of HTN and History of Diabetes.  Negative CHADS2 values include Age > 15 years old.    Current Medications (verified): 1)  Multivitamins  Tabs (Multiple Vitamin) .... Take 1 Tablet By Mouth Once A Day 2)  Benazepril Hcl 20 Mg Tabs (Benazepril Hcl) .... Take 1 Tablet By Mouth Once A Day 3)  Digoxin 0.125 Mg Tabs (Digoxin) .... Take A Half  Tablet By Mouth Daily 4)  Simvastatin 40 Mg Tabs (Simvastatin) .... Take One Tablet By Mouth Daily At Bedtime 5)  Aspirin Ec 325 Mg Tbec (Aspirin) .... Take One Tablet By Mouth Daily 6)  Metformin Hcl 500 Mg Xr24h-Tab (Metformin Hcl) .... Take Two Tablets Once Daily 7)  Metoprolol Tartrate 25 Mg Tabs (Metoprolol Tartrate) .... Take 1 Tablet By Mouth Once A Day 8)  Flonase 50 Mcg/act Susp (Fluticasone Propionate) .... As Needed  Allergies (verified): No Known Drug Allergies  Vital Signs:  Patient profile:   45 year old male Height:      70 inches Weight:      274.50 pounds BMI:     39.53 Pulse rate:   79 / minute Pulse rhythm:   regular Resp:     18 per  minute BP sitting:   144 / 98  (left arm) Cuff size:   large  Vitals Entered By: [name] (March 21, 2009 3:31 PM)  Physical Exam  General:  Well developed, well nourished, in no acute distress.  Obese Head:  normocephalic and atraumatic Neck:  Large. Lungs:  Clear bilaterally to auscultation and percussion. Heart:  PMI hard to feel.  Normal S1 and S2.  No murmur, rub.  S4 gallop. Extremities:  No clubbing or cyanosis.  No edema.   EKG  Procedure date:  03/21/2009  Findings:      NSR.  Nonspecific ST abnormality.  Impression & Recommendations:  Problem # 1:  HYPERTENSION, MODERATE (ICD-401.9) Not very good control, complicated by issues with sleep apnea, obesity, difficult sleep habits.  Increase metoprolol His updated medication list for this problem includes:    Benazepril Hcl 20 Mg Tabs (Benazepril hcl) .Marland Kitchen... Take 1 tablet by mouth once a day    Aspirin Ec 325 Mg Tbec (Aspirin) .Marland Kitchen... Take one tablet by mouth daily    Metoprolol Succinate 50 Mg Xr24h-tab (Metoprolol succinate) .Marland Kitchen... Take one tablet by mouth daily  Orders: EKG w/ Interpretation (93000)  Problem # 2:  ATRIAL FIBRILLATION, PAROXYSMAL, CHRONIC (ICD-427.31) Metoprolol increased for hypertension, will stop digoxin as likely not necessary for  rate control, and no impact on recurrence. The following medications were removed from the medication list:    Digoxin 0.125 Mg Tabs (Digoxin) .Marland Kitchen... Take a half  tablet by mouth daily His updated medication list for this problem includes:    Aspirin Ec 325 Mg Tbec (Aspirin) .Marland Kitchen... Take one tablet by mouth daily    Metoprolol Succinate 50 Mg Xr24h-tab (Metoprolol succinate) .Marland Kitchen... Take one tablet by mouth daily  Orders: EKG w/ Interpretation ([address])  Problem # 3:  SLEEP APNEA (ICD-780.57) reviewed with patient, and explained issues to him.  Importance of weight for overall health and other problems.    Anticoagulation Management Assessment/Plan:             Patient Instructions: 1)  Your physician wants you to follow-up in:  6 MONTHS.  You will receive a reminder letter in the mail two months in advance. If you don't receive a letter, please call our office to schedule the follow-up appointment. 2)  Your physician has recommended you make the following change in your medication: STOP Digoxin, STOP Metoprolol Tartrate, START Metoprolol Succinate 50mg  once a day  Prescriptions: METOPROLOL SUCCINATE 50 MG XR24H-TAB (METOPROLOL SUCCINATE) Take one tablet by mouth daily  #90 x 4   Entered by:   [name], RN, BSN   Authorized by:   Ronaldo Miyamoto, MD, [facility]   Signed by:   [name], RN, BSN on 03/21/2009   Method used:   Electronically to        [facility] (retail)       [address]       [place], [place]  [address]       Ph: [phone]       Fax: [phone]   RxID:   [id number] SIMVASTATIN 40 MG TABS (SIMVASTATIN) Take one tablet by mouth daily at bedtime  #90 x 4   Entered by:   [name], RN, BSN   Authorized by:   Ronaldo Miyamoto, MD, [facility]   Signed by:   [name], RN, BSN on 03/21/2009   Method used:   Electronically to        [facility] (retail)       [address]       [place], [place]  [address]       Ph: [phone]       Fax: [phone]   RxID:   [id number] BENAZEPRIL HCL 20 MG TABS (BENAZEPRIL HCL) Take 1 tablet by mouth once a day  #90 x 4   Entered by:   [name], RN, BSN   Authorized by:   Ronaldo Miyamoto, MD, [facility]   Signed by:   [name], RN, BSN on 03/21/2009   Method used:   Electronically to        [facility] (retail)       [address]       [place], [place]  [address]       Ph: [phone]       Fax: [phone]   RxID:   [id number]

## 2010-06-25 NOTE — Assessment & Plan Note (Signed)
Summary: f9m   Visit Type:  2 months follow up Referring Provider:  Dr Riley Kill Primary Provider:  [facility]  CC:  no complains.  History of Present Illness: Mr Daniel Grant is very pleasent 45 year old man with PMH of CAD, PAfib on coumadin and Flecainamide, HTN, NIDDM and sleep apnea is here today for a regular follow up.  He was recently seen by Dr Johney Frame and scheduled to get a stress test to look for induced arrythmias on Flecinamide. He is in sinus rhythm today and has been compliant with his meds.  His BP is well controlled today.  His obesity: Has lost 5 pounds in last 2 months and has been actively trying to loose weight.  NIDDM is well controlled on metformin. This is being followed by his PCP.  H eis compliant with his CPAP and has been working day shifts these days and has been feeling much better recently.  Problems Prior to Update: 1)  Shortness of Breath  (ICD-786.05) 2)  Hypercholesterolemia  (ICD-272.0) 3)  Sleep Apnea  (ICD-780.57) 4)  Hypertension, Moderate  (ICD-401.9) 5)  Aodm  (ICD-250.00) 6)  Atrial Fibrillation, Paroxysmal, Chronic  (ICD-427.31) 7)  Obesity, Morbid  (ICD-278.01)  Medications Prior to Update: 1)  Multivitamins  Tabs (Multiple Vitamin) .... Take 1 Tablet By Mouth Once A Day 2)  Simvastatin 40 Mg Tabs (Simvastatin) .... Take One Tablet By Mouth Daily At Bedtime 3)  Aspirin Ec 325 Mg Tbec (Aspirin) .... Take One Tablet By Mouth Daily 4)  Metformin Hcl 500 Mg Xr24h-Tab (Metformin Hcl) .... Take Two Tablets Once Daily 5)  Metoprolol Succinate 100 Mg Xr24h-Tab (Metoprolol Succinate) .... Take One Tablet By Mouth Daily 6)  Flonase 50 Mcg/act Susp (Fluticasone Propionate) .... As Needed 7)  Viagra 50 Mg Tabs (Sildenafil Citrate) .... Take As Directed 8)  Digoxin 0.125 Mg Tabs (Digoxin) .... Take 1 Tablet By Mouth Once A Day 9)  Amlodipine Besylate 10 Mg Tabs (Amlodipine Besylate) .... Take 1 Tablet By Mouth Once A Day 10)  Warfarin Sodium 10 Mg  Tabs (Warfarin Sodium) .... Take As Directed By Coumadin Clinic. 11)  Hydrochlorothiazide 12.5 Mg Caps (Hydrochlorothiazide) .... Take 1 Tablet By Mouth Once A Day 12)  Benazepril Hcl 20 Mg Tabs (Benazepril Hcl) .... Take 1 Tablet By Mouth Once A Day 13)  Flecainide Acetate 100 Mg Tabs (Flecainide Acetate) .... One By Mouth Bid  Current Medications (verified): 1)  Multivitamins  Tabs (Multiple Vitamin) .... Take 1 Tablet By Mouth Once A Day 2)  Simvastatin 40 Mg Tabs (Simvastatin) .... Take One Tablet By Mouth Daily At Bedtime 3)  Aspirin Ec 325 Mg Tbec (Aspirin) .... Take One Tablet By Mouth Daily 4)  Metformin Hcl 500 Mg Xr24h-Tab (Metformin Hcl) .... Take Two Tablets Once Daily 5)  Metoprolol Succinate 100 Mg Xr24h-Tab (Metoprolol Succinate) .... Take One Tablet By Mouth Daily 6)  Flonase 50 Mcg/act Susp (Fluticasone Propionate) .... As Needed 7)  Viagra 50 Mg Tabs (Sildenafil Citrate) .... Take As Directed 8)  Digoxin 0.125 Mg Tabs (Digoxin) .... Take 1 Tablet By Mouth Once A Day 9)  Amlodipine Besylate 10 Mg Tabs (Amlodipine Besylate) .... Take 1 Tablet By Mouth Once A Day 10)  Warfarin Sodium 10 Mg Tabs (Warfarin Sodium) .... Take As Directed By Coumadin Clinic. 11)  Hydrochlorothiazide 12.5 Mg Caps (Hydrochlorothiazide) .... Take 1 Tablet By Mouth Once A Day 12)  Benazepril Hcl 20 Mg Tabs (Benazepril Hcl) .... Take 1 Tablet By Mouth Once  A Day 13)  Flecainide Acetate 100 Mg Tabs (Flecainide Acetate) .... One By Mouth Bid  Allergies (verified): No Known Drug Allergies  Past History:  Past Medical History: Last updated: 12/03/2009 HYPERCHOLESTEROLEMIA (ICD-272.0) SLEEP APNEA (ICD-780.57) HYPERTENSION, MODERATE (ICD-401.9) AODM (ICD-250.00) ATRIAL FIBRILLATION, PAROXYSMAL  OBESITY, MORBID (ICD-278.01) Normal cath 2001 erectile dysfunction  Past Surgical History: Last updated: 08/29/2008 Left 1st. finger - 1989  Family History: Last updated: 08/29/2008 His family history  is remarkable for a grandmother who died with a myocardial infarctation and an uncle who died with an MI at age 49. His mother died at age 49 of a shotgun wound.. He does not have brothers and sisters.  Social History: Last updated: 10/17/2009 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 [place] Alcohol Use - no Drug Use - no Tob- none  Family History: Reviewed history from 08/29/2008 and no changes required. His family history is remarkable for a grandmother who died with a myocardial infarctation and an uncle who died with an MI at age 59. His mother died at age 26 of a shotgun wound.. He does not have brothers and sisters.  Social History: Reviewed history from 10/17/2009 and no changes required. 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 [place] Alcohol Use - no Drug Use - no Tob- none  Review of Systems      See HPI  Vital Signs:  Patient profile:   45 year old male Height:      70 inches Weight:      266.75 pounds BMI:     38.41 Pulse rate:   59 / minute Pulse rhythm:   regular Resp:     18 per minute BP sitting:   116 / 84  (left arm) Cuff size:   large  Vitals Entered By: [name] (January 11, 2010 4:14 PM)  Physical Exam  Additional Exam:  Gen: AOx3, in no acute distress Eyes: PERRL, EOMI ENT:MMM, No erythema noted in posterior pharynx Neck: No JVD, No LAP Chest: CTAB with  good respiratory effort CVS: regular rhythmic rate, NO M/R/G, S1 S2 normal Abdo: soft,ND, BS+x4, Non tender and No hepatosplenomegaly EXT: No odema noted Neuro: Non focal, gait is normal Skin: no rashes noted.    EKG  Procedure date:  01/11/2010  Findings:      Normal sinus rhythm with rate of 59/min.  Impression & Recommendations:  Problem # 1:  ATRIAL FIBRILLATION, PAROXYSMAL, CHRONIC (ICD-427.31) Assessment Improved  CHADS2 of 2 which gives a 4% risk of stroke per year. Already on Coumadin. rhythm control on Flecainamide. Follow up with Dr Johney Frame. is updated  medication list for this problem includes:    Aspirin Ec 325 Mg Tbec (Aspirin) .Marland Kitchen... Take one tablet by mouth daily    Metoprolol Succinate 100 Mg Xr24h-tab (Metoprolol succinate) .Marland Kitchen... Take one tablet by mouth daily    Digoxin 0.125 Mg Tabs (Digoxin) .Marland Kitchen... Take 1 tablet by mouth once a day    Warfarin Sodium 10 Mg Tabs (Warfarin sodium) .Marland Kitchen... Take as directed by coumadin clinic.    Flecainide Acetate 100 Mg Tabs (Flecainide acetate) ..... One by mouth bid  His updated medication list for this problem includes:    Aspirin 81 Mg Tbec (Aspirin) .Marland Kitchen... Take one tablet by mouth daily    Metoprolol Succinate 100 Mg Xr24h-tab (Metoprolol succinate) .Marland Kitchen... Take one tablet by mouth daily    Digoxin 0.125 Mg Tabs (Digoxin) .Marland Kitchen... Take 1 tablet by mouth once a day    Warfarin Sodium 10  Mg Tabs (Warfarin sodium) .Marland Kitchen... Take as directed by coumadin clinic.    Flecainide Acetate 100 Mg Tabs (Flecainide acetate) ..... One by mouth bid  Orders: EKG w/ Interpretation ([address])  Problem # 2:  HYPERTENSION, MODERATE (ICD-401.9) Assessment: Improved Well controlled. His updated medication list for this problem includes:    Aspirin 81 Mg Tbec (Aspirin) .Marland Kitchen... Take one tablet by mouth daily    Metoprolol Succinate 100 Mg Xr24h-tab (Metoprolol succinate) .Marland Kitchen... Take one tablet by mouth daily    Amlodipine Besylate 10 Mg Tabs (Amlodipine besylate) .Marland Kitchen... Take 1 tablet by mouth once a day    Hydrochlorothiazide 12.5 Mg Caps (Hydrochlorothiazide) .Marland Kitchen... Take 1 tablet by mouth once a day    Benazepril Hcl 20 Mg Tabs (Benazepril hcl) .Marland Kitchen... Take 1 tablet by mouth once a day  BP today: 116/84 Prior BP: 124/80 (12/03/2009)  Labs Reviewed: K+: 4.2 (09/13/2009) Creat: : 1.1 (09/13/2009)     Problem # 3:  OBESITY, MORBID (ICD-278.01) Assessment: Improved Lost 5 pounds since last seen. We discussed 20 mins about the ways to loose weight.  Problem # 4:  SLEEP APNEA (ICD-780.57) Assessment: Unchanged Compliant with his  CPAP machine at night.  Problem # 5:  AODM (ICD-250.00) Assessment: Comment Only manged by PCP. His updated medication list for this problem includes:    Aspirin 81 Mg Tbec (Aspirin) .Marland Kitchen... Take one tablet by mouth daily    Metformin Hcl 500 Mg Xr24h-tab (Metformin hcl) .Marland Kitchen... Take two tablets once daily    Benazepril Hcl 20 Mg Tabs (Benazepril hcl) .Marland Kitchen... Take 1 tablet by mouth once a day  His updated medication list for this problem includes:    Aspirin Ec 325 Mg Tbec (Aspirin) .Marland Kitchen... Take one tablet by mouth daily    Metformin Hcl 500 Mg Xr24h-tab (Metformin hcl) .Marland Kitchen... Take two tablets once daily    Benazepril Hcl 20 Mg Tabs (Benazepril hcl) .Marland Kitchen... Take 1 tablet by mouth once a day  Patient Instructions: 1)  Your physician recommends that you schedule a follow-up appointment in: 3 MONTHS 2)  Your physician has recommended you make the following change in your medication: DECREASE Aspirin 81mg  once a day, DECREASE Simvastatin 20mg  once a day Prescriptions: AMLODIPINE BESYLATE 10 MG TABS (AMLODIPINE BESYLATE) Take 1 tablet by mouth once a day  #90 x 3   Entered by:   [name], RN, BSN   Authorized by:   Ronaldo Miyamoto, MD, [facility]   Signed by:   [name], RN, BSN on 01/11/2010   Method used:   Electronically to        [facility] (retail)       [address]       [place], [place]  [address]       Ph: [phone]       Fax: [phone]   RxID:   [id number] SIMVASTATIN 20 MG TABS (SIMVASTATIN) Take one tablet by mouth daily at bedtime  #90 x 3   Entered by:   [name], RN, BSN   Authorized by:   Ronaldo Miyamoto, MD, [facility]   Signed by:   [name], RN, BSN on 01/11/2010   Method used:   Electronically to        [facility] (retail)       [address]       [place], [place]  [address]  Ph: [phone]       Fax: [phone]   RxID:   [id number] BENAZEPRIL HCL 20 MG TABS (BENAZEPRIL  HCL) Take 1 tablet by mouth once a day  #90 x 3   Entered by:   [name], RN, BSN   Authorized by:   Ronaldo Miyamoto, MD, [facility]   Signed by:   [name], RN, BSN on 01/11/2010   Method used:   Electronically to        [facility] (retail)       [address]       [place], [place]  [address]       Ph: [phone]       Fax: [phone]   RxID:   [id number] HYDROCHLOROTHIAZIDE 12.5 MG CAPS (HYDROCHLOROTHIAZIDE) Take 1 tablet by mouth once a day  #90 x 3   Entered by:   [name], RN, BSN   Authorized by:   Ronaldo Miyamoto, MD, [facility]   Signed by:   [name], RN, BSN on 01/11/2010   Method used:   Electronically to        [facility] (retail)       [address]       [place], [place]  [address]       Ph: [phone]       Fax: [phone]   RxID:   [id number] DIGOXIN 0.125 MG TABS (DIGOXIN) Take 1 tablet by mouth once a day  #90 x 3   Entered by:   [name], RN, BSN   Authorized by:   Ronaldo Miyamoto, MD, [facility]   Signed by:   [name], RN, BSN on 01/11/2010   Method used:   Electronically to        [facility] (retail)       [address]       [place], [place]  [address]       Ph: [phone]       Fax: [phone]   RxID:   [id number] METOPROLOL SUCCINATE 100 MG XR24H-TAB (METOPROLOL SUCCINATE) Take one tablet by mouth daily  #90 x 3   Entered by:   [name], RN, BSN   Authorized by:   Ronaldo Miyamoto, MD, [facility]   Signed by:   [name], RN, BSN on 01/11/2010   Method used:   Electronically to        [facility] (retail)       [address]       [place], [place]  [address]       Ph: [phone]       Fax: [phone]   RxID:   [id number]   Appended Document: f55m ECG:  SB, minor nonspecific ST/T abnormality.  No acute changes.   Patient was seen with Dr. Eben Burow and myself.  TS

## 2010-06-25 NOTE — Medication Information (Signed)
Summary: rov/ewj  Anticoagulant Therapy  Managed by: Bethena Midget, RN, BSN Referring MD: Dr Riley Kill PCP: [facility] Supervising MD: Eden Emms MD, Theron Arista Indication 1: Atrial Fibrillation Lab Used: [facility] [place] Site: [address] INR POC 2.1 INR RANGE 2.0-3.0  Dietary changes: no    Health status changes: no    Bleeding/hemorrhagic complications: no    Recent/future hospitalizations: no    Any changes in medication regimen? no    Recent/future dental: no  Any missed doses?: no       Is patient compliant with meds? yes      Comments: Pt. pulse is irregular during palpatation. He denies any compliants of SOB, chest pain, or dizziness. Sees Dr. Riley Kill on 09/13/09 will call if he experiences any problems.   Allergies: No Known Drug Allergies  Anticoagulation Management History:      The patient is taking warfarin and comes in today for a routine follow up visit.  Positive risk factors for bleeding include presence of serious comorbidities.  Negative risk factors for bleeding include an age less than 57 years old.  The bleeding index is 'intermediate risk'.  Positive CHADS2 values include History of HTN and History of Diabetes.  Negative CHADS2 values include Age > 36 years old.  Anticoagulation responsible provider: Eden Emms MD, Theron Arista.  INR POC: 2.1.  Cuvette Lot#: [id number].  Exp: 09/2010.    Anticoagulation Management Assessment/Plan:      The patient's current anticoagulation dose is Warfarin sodium 10 mg tabs: Take as directed by coumadin clinic..  The target INR is 2.0-3.0.  The next INR is due 09/05/2009.  Anticoagulation instructions were given to patient.  Results were reviewed/authorized by Bethena Midget, RN, BSN.  He was notified by Bethena Midget, RN, BSN.         Prior Anticoagulation Instructions: INR 3.0  Start taking 10mg  daily except 5mg  on Wednesdays.  Recheck in 1 week.    Current Anticoagulation Instructions: INR 2.1 Change dose to 3/4  pill on Wednesday and all other days take 1 pill. Recheck in 2 weeks.

## 2010-06-25 NOTE — Assessment & Plan Note (Signed)
Summary: Cardiology Nuclear Study  Nuclear Med Background Indications for Stress Test: Evaluation for Ischemia  Indications Comments: To initiate antiarrhythmic Rx  History: Asthma, Cardioversion, Echo, GXT, Heart Catheterization  History Comments:  ~15 yrs ago GXT:OK per patient: '01Cath:normal: 3/11Cardioversion; 3/11 Echo:EF=50%, mild MR; h/o chronic AFIB, OSA  Symptoms: Chest Tightness, DOE, Fatigue, Palpitations  Symptoms Comments: Last episode of CP:6 months   Nuclear Pre-Procedure Cardiac Risk Factors: History of Smoking, Hypertension, Lipids, NIDDM Caffeine/Decaff Intake: None NPO After: 8:00 PM Lungs: Minimal expiratory wheeze.  Albuterol inhaler used prior to infusion.  O2 Sat 96% on RA. IV 0.9% NS with Angio Cath: 20g     IV Site: (R) Hand IV Started by: Irean Hong RN Chest Size (in) 52     Height (in): 70 Weight (lb): 266 BMI: 38.30 Tech Comments: All a.m. medications taken.  Nuclear Med Study 1 or 2 day study:  1 day     Stress Test Type:  Eugenie Birks Reading MD:  Dietrich Pates, MD     Referring MD:  Hillis Range, MD Resting Radionuclide:  Technetium 69m Tetrofosmin     Resting Radionuclide Dose:  10.9 mCi  Stress Radionuclide:  Technetium 82m Tetrofosmin     Stress Radionuclide Dose:  33 mCi   Stress Protocol   Lexiscan: 0.4 mg   Stress Test Technologist:  Rea [employer] CMA-N     Nuclear Technologist:  Domenic Polite CNMT  Rest Procedure  Myocardial perfusion imaging was performed at rest 45 minutes following the intravenous administration of Myoview Technetium 64m Tetrofosmin.  Stress Procedure  The patient received IV Lexiscan 0.4 mg over 15-seconds.  Myoview injected at 30-seconds.  There were no significant changes with infusion.  Quantitative spect images were obtained after a 45 minute delay.  QPS Raw Data Images:  Soft tissue (diaphragm) underlies heart. Stress Images:  Thinning with decreased counts in the inferior wall (base, mid), inferoseptal  wall (distal) and apex.  Otherwise normal perfsuion. Rest Images:  No significant change from the stress images. Subtraction (SDS):  No evidence of ischemia. Transient Ischemic Dilatation:  1.13  (Normal <1.22)  Lung/Heart Ratio:  .29  (Normal <0.45)  Quantitative Gated Spect Images QGS cine images:  non-gated study   Overall Impression  Exercise Capacity: Lexiscan protocol. BP Response: Normal blood pressure response. Clinical Symptoms: No chest pain ECG Impression: No significant ST segment change suggestive of ischemia. Overall Impression Comments: Inferior, inferoseptal and apical thinning consistent with possible scar and/or soft tissue attenuation.  No evidence for ischemia.  Images were not gated due to HR irregularity.  Recommend echo to evaluate regional wall motion.  Appended Document: Cardiology Nuclear Study I have reviewed above and also 3/11 echo which reveals no evidence of inferior scar.  This is most likely diaphragmatic attenuation.  This is a low risk nuclear study.  Start flecainide 100mg  two times a day.  Have pt return to see me in 4 weeks (early July as previously scheduled)  Appended Document: Cardiology Nuclear Study spoke with daughter  aware test looks good will have pt call me back regarding medication

## 2010-06-25 NOTE — Assessment & Plan Note (Signed)
Summary: PER CHECK OUT/SAF   Visit Type:  Follow-up Referring Provider:  Dr Riley Kill Primary Provider:  [facility]   History of Present Illness: The patient presents today for routine electrophysiology followup. He reports doing very well since last being seen in our clinic. The patient denies symptoms of palpitations, chest pain, shortness of breath, orthopnea, PND, lower extremity edema, dizziness, presyncope, syncope, or neurologic sequela. The patient is tolerating medications without difficulties and is otherwise without complaint today.   Current Medications (verified): 1)  Multivitamins  Tabs (Multiple Vitamin) .... Take 1 Tablet By Mouth Once A Day 2)  Simvastatin 20 Mg Tabs (Simvastatin) .... Take One Tablet By Mouth Daily At Bedtime 3)  Aspirin 81 Mg Tbec (Aspirin) .... Take One Tablet By Mouth Daily 4)  Metformin Hcl 500 Mg Xr24h-Tab (Metformin Hcl) .... Take Two Tablets Once Daily 5)  Metoprolol Succinate 100 Mg Xr24h-Tab (Metoprolol Succinate) .... Take One Tablet By Mouth Daily 6)  Flonase 50 Mcg/act Susp (Fluticasone Propionate) .... As Needed 7)  Viagra 50 Mg Tabs (Sildenafil Citrate) .... Take As Directed 8)  Digoxin 0.125 Mg Tabs (Digoxin) .... Take 1 Tablet By Mouth Once A Day 9)  Amlodipine Besylate 10 Mg Tabs (Amlodipine Besylate) .... Take 1 Tablet By Mouth Once A Day 10)  Warfarin Sodium 10 Mg Tabs (Warfarin Sodium) .... Take As Directed By Coumadin Clinic. 11)  Hydrochlorothiazide 12.5 Mg Caps (Hydrochlorothiazide) .... Take 1 Tablet By Mouth Once A Day 12)  Benazepril Hcl 20 Mg Tabs (Benazepril Hcl) .... Take 1 Tablet By Mouth Once A Day 13)  Flecainide Acetate 100 Mg Tabs (Flecainide Acetate) .... One By Mouth Bid  Allergies (verified): No Known Drug Allergies  Past History:  Past Medical History: Reviewed history from 12/03/2009 and no changes required. HYPERCHOLESTEROLEMIA (ICD-272.0) SLEEP APNEA (ICD-780.57) HYPERTENSION, MODERATE (ICD-401.9) AODM  (ICD-250.00) ATRIAL FIBRILLATION, PAROXYSMAL  OBESITY, MORBID (ICD-278.01) Normal cath 2001 erectile dysfunction  Past Surgical History: Reviewed history from 08/29/2008 and no changes required. Left 1st. finger - 1989  Vital Signs:  Patient profile:   45 year old male Height:      70 inches Weight:      276 pounds BMI:     39.75 Pulse rate:   64 / minute BP sitting:   122 / 88  (left arm)  Vitals Entered By: Laurance Flatten CMA (May 03, 2010 3:59 PM)  Physical Exam  General:  overweight Head:  normocephalic and atraumatic Eyes:  PERRLA/EOM intact; conjunctiva and lids normal. Mouth:  Teeth, gums and palate normal. Oral mucosa normal. Neck:  supple Lungs:  Clear bilaterally to auscultation and percussion. Heart:  RRR, no m/r/g Abdomen:  Bowel sounds positive; abdomen soft and non-tender without masses, organomegaly, or hernias noted. No hepatosplenomegaly. Msk:  Back normal, normal gait. Muscle strength and tone normal. Extremities:  No clubbing or cyanosis. Neurologic:  Alert and oriented x 3.   Impression & Recommendations:  Problem # 1:  ATRIAL FIBRILLATION, PAROXYSMAL, CHRONIC (ICD-427.31) well controlled no changes today continue flecainide and coumadin  Problem # 2:  SLEEP APNEA (ICD-780.57) doing very well with CPAP  Problem # 3:  OBESITY, MORBID (ICD-278.01) regular exercise and weight loss reinforced today  Patient Instructions: 1)  Your physician recommends that you schedule a follow-up appointment with Dr Johney Frame as needed

## 2010-06-25 NOTE — Progress Notes (Signed)
Summary: sob, fluttering  Phone Note Call from Patient Call back at Home Phone [phone] Call back at [phone]   Caller: Patient Reason for Call: Talk to Nurse Complaint: Breathing Problems Details for Reason: Per pt calling. c/o sob, fluttering. pt has appt on 5/3, wants a sooner appt.  Initial call taken by: Lorne Skeens,  July 30, 2009 2:20 PM  Follow-up for Phone Call        I spoke with the pt and since Thursday the pt has noticed when he exerts himself (walking up steps) his heart rate gets fast and he is SOB.  The pt is having a fluttering in his chest.  The pt denies CP.  I scheduled this pt to see Dr Excell Seltzer today in the office.  Follow-up by: [name], RN, BSN,  July 30, 2009 2:40 PM

## 2010-06-27 NOTE — Medication Information (Signed)
Summary: rov/sp  Anticoagulant Therapy  Managed by: Weston Brass, PharmD Referring MD: Hillis Range, MD PCP: [facility] Supervising MD: Ladona Ridgel MD, Sharlot Gowda Indication 1: Atrial Fibrillation Lab Used: [facility] [place] Site: [address] INR POC 3.0 INR RANGE 2.0-3.0  Dietary changes: yes       Details: Eating more greens than normal   Health status changes: no    Bleeding/hemorrhagic complications: no    Recent/future hospitalizations: no    Any changes in medication regimen? no    Recent/future dental: no  Any missed doses?: no       Is patient compliant with meds? yes       Allergies: No Known Drug Allergies  Anticoagulation Management History:      The patient is taking warfarin and comes in today for a routine follow up visit.  Positive risk factors for bleeding include presence of serious comorbidities.  Negative risk factors for bleeding include an age less than 64 years old.  The bleeding index is 'intermediate risk'.  Positive CHADS2 values include History of HTN and History of Diabetes.  Negative CHADS2 values include Age > 58 years old.  His last INR was 2.3.  Anticoagulation responsible provider: Ladona Ridgel MD, Sharlot Gowda.  INR POC: 3.0.  Cuvette Lot#: [id number].  Exp: 05/2011.    Anticoagulation Management Assessment/Plan:      The patient's current anticoagulation dose is Warfarin sodium 10 mg tabs: Take as directed by coumadin clinic..  The target INR is 2.0-3.0.  The next INR is due 06/12/2010.  Anticoagulation instructions were given to patient.  Results were reviewed/authorized by Weston Brass, PharmD.  He was notified by Stephannie Peters, PharmD Candidate .         Prior Anticoagulation Instructions: INR 1.5  Take 1 1/2 tablets today and tomorrow then resume same dose of 1 tablet every day.  Recheck INR 3 weeks.   Current Anticoagulation Instructions: INR 3.0  Coumadin 10 mg tablets - Continue 1 tablet every day

## 2010-06-27 NOTE — Medication Information (Signed)
Summary: ROV  Anticoagulant Therapy  Managed by: Weston Brass, PharmD Referring MD: Hillis Range, MD PCP: [facility] Supervising MD: Riley Kill MD, Maisie Fus Indication 1: Atrial Fibrillation Lab Used: [facility] [place] Site: [address] INR POC 1.5 INR RANGE 2.0-3.0  Dietary changes: no    Health status changes: no    Bleeding/hemorrhagic complications: no    Recent/future hospitalizations: no    Any changes in medication regimen? no    Recent/future dental: no  Any missed doses?: yes     Details: missed 2 doses last week   Is patient compliant with meds? yes       Allergies: No Known Drug Allergies  Anticoagulation Management History:      The patient is taking warfarin and comes in today for a routine follow up visit.  Positive risk factors for bleeding include presence of serious comorbidities.  Negative risk factors for bleeding include an age less than 73 years old.  The bleeding index is 'intermediate risk'.  Positive CHADS2 values include History of HTN and History of Diabetes.  Negative CHADS2 values include Age > 72 years old.  His last INR was 2.3.  Anticoagulation responsible provider: Riley Kill MD, Maisie Fus.  INR POC: 1.5.  Cuvette Lot#: [id number].  Exp: 05/2011.    Anticoagulation Management Assessment/Plan:      The patient's current anticoagulation dose is Warfarin sodium 10 mg tabs: Take as directed by coumadin clinic..  The target INR is 2.0-3.0.  The next INR is due 06/12/2010.  Anticoagulation instructions were given to patient.  Results were reviewed/authorized by Weston Brass, PharmD.  He was notified by Weston Brass PharmD.         Prior Anticoagulation Instructions: INR: 2.3  Your INR is within goal today.  Please continue to take 10 mg (1 tablet) every day and return for another INR check in 4 weeks.   Current Anticoagulation Instructions: INR 1.5  Take 1 1/2 tablets today and tomorrow then resume same dose of 1 tablet every day.   Recheck INR 3 weeks.

## 2010-07-15 DIAGNOSIS — I4891 Unspecified atrial fibrillation: Secondary | ICD-10-CM

## 2010-07-17 ENCOUNTER — Encounter: Payer: Self-pay | Admitting: Cardiology

## 2010-07-17 ENCOUNTER — Encounter (INDEPENDENT_AMBULATORY_CARE_PROVIDER_SITE_OTHER): Payer: BC Managed Care – PPO

## 2010-07-17 ENCOUNTER — Ambulatory Visit (INDEPENDENT_AMBULATORY_CARE_PROVIDER_SITE_OTHER): Payer: BC Managed Care – PPO | Admitting: Cardiology

## 2010-07-17 DIAGNOSIS — I4891 Unspecified atrial fibrillation: Secondary | ICD-10-CM

## 2010-07-17 DIAGNOSIS — Z7901 Long term (current) use of anticoagulants: Secondary | ICD-10-CM

## 2010-07-17 DIAGNOSIS — I10 Essential (primary) hypertension: Secondary | ICD-10-CM

## 2010-07-17 LAB — CONVERTED CEMR LAB: POC INR: 2.8

## 2010-07-23 NOTE — Medication Information (Signed)
Summary: ccr/tmj  Anticoagulant Therapy  Managed by: Georgina Pillion, PharmD Referring MD: Hillis Range, MD PCP: [facility] Supervising MD: Shirlee Latch MD, Tianna Baus Indication 1: Atrial Fibrillation Lab Used: [facility] [place] Site: [address] INR POC 2.8 INR RANGE 2.0-3.0  Dietary changes: no    Health status changes: no    Bleeding/hemorrhagic complications: no    Recent/future hospitalizations: no    Any changes in medication regimen? no    Recent/future dental: no  Any missed doses?: no       Is patient compliant with meds? yes       Allergies: No Known Drug Allergies  Anticoagulation Management History:      Positive risk factors for bleeding include presence of serious comorbidities.  Negative risk factors for bleeding include an age less than 52 years old.  The bleeding index is 'intermediate risk'.  Positive CHADS2 values include History of HTN and History of Diabetes.  Negative CHADS2 values include Age > 57 years old.  His last INR was 2.3.  Anticoagulation responsible provider: Shirlee Latch MD, Niveah Boerner.  INR POC: 2.8.  Cuvette Lot#: [id number].  Exp: 05/2011.    Anticoagulation Management Assessment/Plan:      The patient's current anticoagulation dose is Warfarin sodium 10 mg tabs: Take as directed by coumadin clinic..  The target INR is 2.0-3.0.  The next INR is due 08/14/2010.  Anticoagulation instructions were given to patient.  Results were reviewed/authorized by Georgina Pillion, PharmD.  He was notified by Georgina Pillion PharmD.         Prior Anticoagulation Instructions: INR 3.0  Coumadin 10 mg tablets - Continue 1 tablet every day    Current Anticoagulation Instructions: Continue taking 1 tablet (10 mg) daily.  INR 2.8

## 2010-07-24 ENCOUNTER — Other Ambulatory Visit: Payer: Self-pay | Admitting: Cardiology

## 2010-07-24 ENCOUNTER — Other Ambulatory Visit (INDEPENDENT_AMBULATORY_CARE_PROVIDER_SITE_OTHER): Payer: BC Managed Care – PPO

## 2010-07-24 ENCOUNTER — Encounter: Payer: Self-pay | Admitting: Cardiology

## 2010-07-24 DIAGNOSIS — I10 Essential (primary) hypertension: Secondary | ICD-10-CM

## 2010-07-24 DIAGNOSIS — I4891 Unspecified atrial fibrillation: Secondary | ICD-10-CM

## 2010-07-24 LAB — BASIC METABOLIC PANEL
BUN: 20 mg/dL (ref 6–23)
CO2: 27 mEq/L (ref 19–32)
Calcium: 9.2 mg/dL (ref 8.4–10.5)
Chloride: 103 mEq/L (ref 96–112)
Creatinine, Ser: 0.8 mg/dL (ref 0.4–1.5)
GFR: 131.4 mL/min (ref >60.00)
Glucose, Bld: 104 mg/dL — ABNORMAL HIGH (ref 70–99)
Potassium: 4.3 mEq/L (ref 3.5–5.1)
Sodium: 139 mEq/L (ref 135–145)

## 2010-07-24 LAB — HEPATIC FUNCTION PANEL
ALT: 34 U/L (ref 0–53)
AST: 21 U/L (ref 0–37)
Albumin: 3.9 g/dL (ref 3.5–5.2)
Alkaline Phosphatase: 90 U/L (ref 39–117)
Bilirubin, Direct: 0.2 mg/dL (ref 0.0–0.3)
Total Bilirubin: 1 mg/dL (ref 0.3–1.2)
Total Protein: 6.7 g/dL (ref 6.0–8.3)

## 2010-07-24 LAB — LIPID PANEL
Cholesterol: 124 mg/dL (ref 0–200)
HDL: 30.7 mg/dL — ABNORMAL LOW (ref >39.00)
LDL Cholesterol: 71 mg/dL (ref 0–99)
Total CHOL/HDL Ratio: 4
Triglycerides: 111 mg/dL (ref 0.0–149.0)
VLDL: 22.2 mg/dL (ref 0.0–40.0)

## 2010-07-25 LAB — CONVERTED CEMR LAB: Digitoxin Lvl: 0.5 ng/mL — ABNORMAL LOW (ref 0.8–2.0)

## 2010-08-02 ENCOUNTER — Telehealth: Payer: Self-pay | Admitting: Cardiology

## 2010-08-06 NOTE — Assessment & Plan Note (Signed)
Summary: 2 month rov   Visit Type:  66mo follow up Referring Provider:  Dr Riley Kill Primary Provider:  [facility]  CC:  edema in ankles.  History of Present Illness: Daniel Grant is doing ok.  We continue to discuss his overall situation, and he shared with me some of his past life concerns as it relates to his kids.  He is doing well, with perhaps a small amount of lower extremity edema.  No chest pain at present.  He is on flecainide with anticoagulation due to higher CHADS score.  He is stable.  Current Medications (verified): 1)  Multivitamins  Tabs (Multiple Vitamin) .... Take 1 Tablet By Mouth Once A Day 2)  Simvastatin 20 Mg Tabs (Simvastatin) .... Take One Tablet By Mouth Daily At Bedtime 3)  Aspirin 81 Mg Tbec (Aspirin) .... Take One Tablet By Mouth Daily 4)  Metformin Hcl 500 Mg Xr24h-Tab (Metformin Hcl) .... Take Two Tablets Once Daily 5)  Metoprolol Succinate 100 Mg Xr24h-Tab (Metoprolol Succinate) .... Take One Tablet By Mouth Daily 6)  Flonase 50 Mcg/act Susp (Fluticasone Propionate) .... As Needed 7)  Viagra 50 Mg Tabs (Sildenafil Citrate) .... Take As Directed 8)  Digoxin 0.125 Mg Tabs (Digoxin) .... Take 1 Tablet By Mouth Once A Day 9)  Amlodipine Besylate 10 Mg Tabs (Amlodipine Besylate) .... Take 1 Tablet By Mouth Once A Day 10)  Warfarin Sodium 10 Mg Tabs (Warfarin Sodium) .... Take As Directed By Coumadin Clinic. 11)  Hydrochlorothiazide 12.5 Mg Caps (Hydrochlorothiazide) .... Take 1 Tablet By Mouth Once A Day 12)  Benazepril Hcl 20 Mg Tabs (Benazepril Hcl) .... Take 1 Tablet By Mouth Once A Day 13)  Flecainide Acetate 100 Mg Tabs (Flecainide Acetate) .... One By Mouth Bid  Allergies (verified): No Known Drug Allergies  Past History:  Past Medical History: Last updated: 12/03/2009 HYPERCHOLESTEROLEMIA (ICD-272.0) SLEEP APNEA (ICD-780.57) HYPERTENSION, MODERATE (ICD-401.9) AODM (ICD-250.00) ATRIAL FIBRILLATION, PAROXYSMAL  OBESITY, MORBID (ICD-278.01) Normal  cath 2001 erectile dysfunction  Vital Signs:  Patient profile:   45 year old male Height:      70 inches Weight:      281.50 pounds BMI:     40.54 Pulse rate:   62 / minute Resp:     18 per minute BP sitting:   126 / 80  (left arm) Cuff size:   large  Vitals Entered By: Celestia Khat, CMA (July 17, 2010 3:53 PM)  Physical Exam  General:  obese gentleman in no distress. Head:  normocephalic and atraumatic Neck:  Thick neck.  No carotid bruits. Lungs:  Clear bilaterally to auscultation and percussion. Heart:  PMI non displaced with normal S1 and S2.  No murmur or gallop.  Abdomen:  Bowel sounds positive; abdomen soft and non-tender without masses, organomegaly, or hernias noted. No hepatosplenomegaly. Pulses:  pulses normal in all 4 extremities Extremities:  No clubbing or cyanosis.  Trace edema Neurologic:  Alert and oriented x 3.   EKG  Procedure date:  07/17/2010  Findings:      NSR.  WNL.    Impression & Recommendations:  Problem # 1:  ATRIAL FIBRILLATION, PAROXYSMAL, CHRONIC (ICD-427.31) Remains on combo therapies with rate control, rhythm control, and anticoagulation for higher CHADS score  (HTN, DM) His updated medication list for this problem includes:    Aspirin 81 Mg Tbec (Aspirin) .Marland Kitchen... Take one tablet by mouth daily    Metoprolol Succinate 100 Mg Xr24h-tab (Metoprolol succinate) .Marland Kitchen... Take one tablet by mouth daily  Digoxin 0.125 Mg Tabs (Digoxin) .Marland Kitchen... Take 1 tablet by mouth once a day    Warfarin Sodium 10 Mg Tabs (Warfarin sodium) .Marland Kitchen... Take as directed by coumadin clinic.    Flecainide Acetate 100 Mg Tabs (Flecainide acetate) ..... One by mouth bid  Orders: EKG w/ Interpretation (93000)  Problem # 2:  HYPERTENSION, MODERATE (ICD-401.9) Controlled.  LE edema likely secondary to amlodipine.   His updated medication list for this problem includes:    Aspirin 81 Mg Tbec (Aspirin) .Marland Kitchen... Take one tablet by mouth daily    Metoprolol Succinate 100 Mg  Xr24h-tab (Metoprolol succinate) .Marland Kitchen... Take one tablet by mouth daily    Amlodipine Besylate 10 Mg Tabs (Amlodipine besylate) .Marland Kitchen... Take 1 tablet by mouth once a day    Hydrochlorothiazide 12.5 Mg Caps (Hydrochlorothiazide) .Marland Kitchen... Take 1 tablet by mouth once a day    Benazepril Hcl 20 Mg Tabs (Benazepril hcl) .Marland Kitchen... Take 1 tablet by mouth once a day  Orders: EKG w/ Interpretation (93000)  Problem # 3:  HYPERCHOLESTEROLEMIA (ICD-272.0) On low dose, but given other drugs will switch to pravastatin 40mg  and recheck in six weeks.   His updated medication list for this problem includes:    Simvastatin 20 Mg Tabs (Simvastatin) .Marland Kitchen... Take one tablet by mouth daily at bedtime  Orders: EKG w/ Interpretation (93000)  Problem # 4:  SLEEP APNEA (ICD-780.57) Wieght loss emphasized.  Importance of causative factors all across the board reviewed.    Patient Instructions: 1)  Your physician recommends that you schedule a follow-up appointment in: 4 MONTHS 2)  Your physician recommends that you return for a FASTING LIPID, LIVER, BMP and Digoxin---Nothing to eat or drink after midnight (427.31, 401.9)--do not take digoxin the morning of labwork 3)  Your physician recommends that you continue on your current medications as directed. Please refer to the Current Medication list given to you today. Prescriptions: VIAGRA 50 MG TABS (SILDENAFIL CITRATE) Take as directed  #10 Each x 5   Entered by:   [name], RN, BSN   Authorized by:   Ronaldo Miyamoto, MD, [facility]   Signed by:   [name], RN, BSN on 07/17/2010   Method used:   Electronically to        [facility] (retail)       [address]       [place], [place]  [address]       Ph: [phone]       Fax: [phone]   RxID:   [id number]

## 2010-08-13 NOTE — Progress Notes (Signed)
Summary: Lab results  Phone Note Outgoing Call   Summary of Call: Lauren  --can you call him and have him change at end of this prescription from simva 20 to prava 40.  Thx.  TS  Follow-up for Phone Call        Left message for pt to call back. [name], RN, BSN  August 05, 2010 4:00 PM  Pt cell [phone]).  Left message on cell phone for pt to call back about lab results (lipid, liver, bmp, digoxin)  and changing cholesterol medication.  The pt will need a repeat lipid and liver in 6 WEEKS. [name], RN, BSN  August 07, 2010 3:49 PM  Additional Follow-up for Phone Call Additional follow up Details #1::        Pt aware of lab results.  The pt will stop Simvastatin and start Pravastatin 40mg  at bedtime.  The pt will be due for repeat bloodwork on 09/24/2010.  Information mailed to the pt per his request.  Additional Follow-up by: [name], RN, BSN,  August 07, 2010 4:31 PM    New/Updated Medications: PRAVASTATIN SODIUM 40 MG TABS (PRAVASTATIN SODIUM) Take one tablet by mouth daily at bedtime Prescriptions: PRAVASTATIN SODIUM 40 MG TABS (PRAVASTATIN SODIUM) Take one tablet by mouth daily at bedtime  #30 x 11   Entered by:   [name], RN, BSN   Authorized by:   Ronaldo Miyamoto, MD, [facility]   Signed by:   [name], RN, BSN on 08/07/2010   Method used:   Print then Give to Patient   RxID:   [id number]

## 2010-08-14 ENCOUNTER — Ambulatory Visit (INDEPENDENT_AMBULATORY_CARE_PROVIDER_SITE_OTHER): Payer: 59 | Admitting: *Deleted

## 2010-08-14 DIAGNOSIS — I4891 Unspecified atrial fibrillation: Secondary | ICD-10-CM

## 2010-08-14 DIAGNOSIS — Z7901 Long term (current) use of anticoagulants: Secondary | ICD-10-CM

## 2010-08-14 LAB — POCT INR: INR: 1.2

## 2010-08-14 NOTE — Patient Instructions (Signed)
INR 1.2 Take 1 1/2 tablets today (Wednesday) and tomorrow (Thursday).  Then, resume taking 1 tablet every day. Recheck in 2 weeks.

## 2010-08-19 LAB — HEPARIN LEVEL (UNFRACTIONATED)
Heparin Unfractionated: 0.22 IU/mL — ABNORMAL LOW (ref 0.30–0.70)
Heparin Unfractionated: 0.26 IU/mL — ABNORMAL LOW (ref 0.30–0.70)
Heparin Unfractionated: 0.42 IU/mL (ref 0.30–0.70)
Heparin Unfractionated: 0.45 IU/mL (ref 0.30–0.70)
Heparin Unfractionated: 0.48 IU/mL (ref 0.30–0.70)
Heparin Unfractionated: 0.48 IU/mL (ref 0.30–0.70)
Heparin Unfractionated: 0.52 IU/mL (ref 0.30–0.70)

## 2010-08-19 LAB — CBC
HCT: 38.6 % — ABNORMAL LOW (ref 39.0–52.0)
HCT: 40 % (ref 39.0–52.0)
HCT: 40.1 % (ref 39.0–52.0)
HCT: 41.1 % (ref 39.0–52.0)
HCT: 41.6 % (ref 39.0–52.0)
HCT: 41.8 % (ref 39.0–52.0)
HCT: 44 % (ref 39.0–52.0)
Hemoglobin: 13.3 g/dL (ref 13.0–17.0)
Hemoglobin: 13.4 g/dL (ref 13.0–17.0)
Hemoglobin: 13.6 g/dL (ref 13.0–17.0)
Hemoglobin: 13.7 g/dL (ref 13.0–17.0)
Hemoglobin: 13.9 g/dL (ref 13.0–17.0)
Hemoglobin: 14 g/dL (ref 13.0–17.0)
Hemoglobin: 14.9 g/dL (ref 13.0–17.0)
MCHC: 33.2 g/dL (ref 30.0–36.0)
MCHC: 33.4 g/dL (ref 30.0–36.0)
MCHC: 33.5 g/dL (ref 30.0–36.0)
MCHC: 33.6 g/dL (ref 30.0–36.0)
MCHC: 33.9 g/dL (ref 30.0–36.0)
MCHC: 33.9 g/dL (ref 30.0–36.0)
MCHC: 34.4 g/dL (ref 30.0–36.0)
MCV: 86.2 fL (ref 78.0–100.0)
MCV: 86.2 fL (ref 78.0–100.0)
MCV: 86.6 fL (ref 78.0–100.0)
MCV: 86.8 fL (ref 78.0–100.0)
MCV: 87 fL (ref 78.0–100.0)
MCV: 87 fL (ref 78.0–100.0)
MCV: 87.2 fL (ref 78.0–100.0)
Platelets: 165 10*3/uL (ref 150–400)
Platelets: 168 10*3/uL (ref 150–400)
Platelets: 177 10*3/uL (ref 150–400)
Platelets: 181 10*3/uL (ref 150–400)
Platelets: 181 10*3/uL (ref 150–400)
Platelets: 187 10*3/uL (ref 150–400)
Platelets: 195 10*3/uL (ref 150–400)
RBC: 4.48 MIL/uL (ref 4.22–5.81)
RBC: 4.59 MIL/uL (ref 4.22–5.81)
RBC: 4.61 MIL/uL (ref 4.22–5.81)
RBC: 4.72 MIL/uL (ref 4.22–5.81)
RBC: 4.79 MIL/uL (ref 4.22–5.81)
RBC: 4.82 MIL/uL (ref 4.22–5.81)
RBC: 5.11 MIL/uL (ref 4.22–5.81)
RDW: 13.5 % (ref 11.5–15.5)
RDW: 13.5 % (ref 11.5–15.5)
RDW: 13.6 % (ref 11.5–15.5)
RDW: 13.6 % (ref 11.5–15.5)
RDW: 13.7 % (ref 11.5–15.5)
RDW: 14 % (ref 11.5–15.5)
RDW: 14 % (ref 11.5–15.5)
WBC: 10.2 10*3/uL (ref 4.0–10.5)
WBC: 10.4 10*3/uL (ref 4.0–10.5)
WBC: 10.5 10*3/uL (ref 4.0–10.5)
WBC: 10.9 10*3/uL — ABNORMAL HIGH (ref 4.0–10.5)
WBC: 9.3 10*3/uL (ref 4.0–10.5)
WBC: 9.4 10*3/uL (ref 4.0–10.5)
WBC: 9.5 10*3/uL (ref 4.0–10.5)

## 2010-08-19 LAB — COMPREHENSIVE METABOLIC PANEL
ALT: 55 U/L — ABNORMAL HIGH (ref 0–53)
AST: 36 U/L (ref 0–37)
Albumin: 3.6 g/dL (ref 3.5–5.2)
Alkaline Phosphatase: 85 U/L (ref 39–117)
BUN: 22 mg/dL (ref 6–23)
CO2: 23 mEq/L (ref 19–32)
Calcium: 8.7 mg/dL (ref 8.4–10.5)
Chloride: 108 mEq/L (ref 96–112)
Creatinine, Ser: 0.87 mg/dL (ref 0.4–1.5)
GFR calc Af Amer: >60 mL/min (ref >60)
GFR calc non Af Amer: >60 mL/min (ref >60)
Glucose, Bld: 98 mg/dL (ref 70–99)
Potassium: 4.3 mEq/L (ref 3.5–5.1)
Sodium: 136 mEq/L (ref 135–145)
Total Bilirubin: 0.7 mg/dL (ref 0.3–1.2)
Total Protein: 6.2 g/dL (ref 6.0–8.3)

## 2010-08-19 LAB — GLUCOSE, CAPILLARY
Glucose-Capillary: 100 mg/dL — ABNORMAL HIGH (ref 70–99)
Glucose-Capillary: 105 mg/dL — ABNORMAL HIGH (ref 70–99)
Glucose-Capillary: 112 mg/dL — ABNORMAL HIGH (ref 70–99)
Glucose-Capillary: 113 mg/dL — ABNORMAL HIGH (ref 70–99)
Glucose-Capillary: 113 mg/dL — ABNORMAL HIGH (ref 70–99)
Glucose-Capillary: 115 mg/dL — ABNORMAL HIGH (ref 70–99)
Glucose-Capillary: 115 mg/dL — ABNORMAL HIGH (ref 70–99)
Glucose-Capillary: 117 mg/dL — ABNORMAL HIGH (ref 70–99)
Glucose-Capillary: 121 mg/dL — ABNORMAL HIGH (ref 70–99)
Glucose-Capillary: 121 mg/dL — ABNORMAL HIGH (ref 70–99)
Glucose-Capillary: 122 mg/dL — ABNORMAL HIGH (ref 70–99)
Glucose-Capillary: 122 mg/dL — ABNORMAL HIGH (ref 70–99)
Glucose-Capillary: 123 mg/dL — ABNORMAL HIGH (ref 70–99)
Glucose-Capillary: 128 mg/dL — ABNORMAL HIGH (ref 70–99)
Glucose-Capillary: 131 mg/dL — ABNORMAL HIGH (ref 70–99)
Glucose-Capillary: 132 mg/dL — ABNORMAL HIGH (ref 70–99)
Glucose-Capillary: 136 mg/dL — ABNORMAL HIGH (ref 70–99)
Glucose-Capillary: 145 mg/dL — ABNORMAL HIGH (ref 70–99)
Glucose-Capillary: 145 mg/dL — ABNORMAL HIGH (ref 70–99)
Glucose-Capillary: 172 mg/dL — ABNORMAL HIGH (ref 70–99)
Glucose-Capillary: 175 mg/dL — ABNORMAL HIGH (ref 70–99)
Glucose-Capillary: 179 mg/dL — ABNORMAL HIGH (ref 70–99)
Glucose-Capillary: 97 mg/dL (ref 70–99)

## 2010-08-19 LAB — BASIC METABOLIC PANEL
BUN: 12 mg/dL (ref 6–23)
CO2: 27 mEq/L (ref 19–32)
Calcium: 9.2 mg/dL (ref 8.4–10.5)
Chloride: 105 mEq/L (ref 96–112)
Creatinine, Ser: 0.85 mg/dL (ref 0.4–1.5)
GFR calc Af Amer: >60 mL/min (ref >60)
GFR calc non Af Amer: >60 mL/min (ref >60)
Glucose, Bld: 105 mg/dL — ABNORMAL HIGH (ref 70–99)
Potassium: 3.9 mEq/L (ref 3.5–5.1)
Sodium: 138 mEq/L (ref 135–145)

## 2010-08-19 LAB — PROTIME-INR
INR: 1.01 (ref 0.00–1.49)
INR: 1.05 (ref 0.00–1.49)
INR: 1.11 (ref 0.00–1.49)
INR: 1.44 (ref 0.00–1.49)
INR: 1.89 — ABNORMAL HIGH (ref 0.00–1.49)
INR: 1.93 — ABNORMAL HIGH (ref 0.00–1.49)
INR: 1.96 — ABNORMAL HIGH (ref 0.00–1.49)
INR: 2.19 — ABNORMAL HIGH (ref 0.00–1.49)
Prothrombin Time: 13.2 seconds (ref 11.6–15.2)
Prothrombin Time: 13.6 seconds (ref 11.6–15.2)
Prothrombin Time: 14.2 seconds (ref 11.6–15.2)
Prothrombin Time: 17.4 seconds — ABNORMAL HIGH (ref 11.6–15.2)
Prothrombin Time: 21.5 seconds — ABNORMAL HIGH (ref 11.6–15.2)
Prothrombin Time: 21.9 seconds — ABNORMAL HIGH (ref 11.6–15.2)
Prothrombin Time: 22.2 seconds — ABNORMAL HIGH (ref 11.6–15.2)
Prothrombin Time: 24.2 seconds — ABNORMAL HIGH (ref 11.6–15.2)

## 2010-08-19 LAB — APTT: aPTT: 28 seconds (ref 24–37)

## 2010-08-19 LAB — TSH: TSH: 0.654 u[IU]/mL (ref 0.350–4.500)

## 2010-08-28 ENCOUNTER — Ambulatory Visit (INDEPENDENT_AMBULATORY_CARE_PROVIDER_SITE_OTHER): Payer: 59 | Admitting: *Deleted

## 2010-08-28 DIAGNOSIS — I4891 Unspecified atrial fibrillation: Secondary | ICD-10-CM

## 2010-08-28 DIAGNOSIS — Z7901 Long term (current) use of anticoagulants: Secondary | ICD-10-CM

## 2010-08-28 LAB — POCT INR: INR: 2.1

## 2010-09-24 ENCOUNTER — Other Ambulatory Visit (INDEPENDENT_AMBULATORY_CARE_PROVIDER_SITE_OTHER): Payer: 59 | Admitting: *Deleted

## 2010-09-24 ENCOUNTER — Ambulatory Visit (INDEPENDENT_AMBULATORY_CARE_PROVIDER_SITE_OTHER): Payer: 59 | Admitting: *Deleted

## 2010-09-24 DIAGNOSIS — I4891 Unspecified atrial fibrillation: Secondary | ICD-10-CM

## 2010-09-24 DIAGNOSIS — E7800 Pure hypercholesterolemia, unspecified: Secondary | ICD-10-CM

## 2010-09-24 LAB — HEPATIC FUNCTION PANEL
ALT: 26 U/L (ref 0–53)
AST: 20 U/L (ref 0–37)
Albumin: 3.7 g/dL (ref 3.5–5.2)
Alkaline Phosphatase: 75 U/L (ref 39–117)
Bilirubin, Direct: 0.1 mg/dL (ref 0.0–0.3)
Total Bilirubin: 0.8 mg/dL (ref 0.3–1.2)
Total Protein: 6.6 g/dL (ref 6.0–8.3)

## 2010-09-24 LAB — LIPID PANEL
Cholesterol: 124 mg/dL (ref 0–200)
HDL: 29 mg/dL — ABNORMAL LOW (ref >39.00)
LDL Cholesterol: 75 mg/dL (ref 0–99)
Total CHOL/HDL Ratio: 4
Triglycerides: 98 mg/dL (ref 0.0–149.0)
VLDL: 19.6 mg/dL (ref 0.0–40.0)

## 2010-09-24 LAB — POCT INR: INR: 1.5

## 2010-09-25 ENCOUNTER — Encounter: Payer: 59 | Admitting: *Deleted

## 2010-10-08 NOTE — Assessment & Plan Note (Signed)
[facility]                            CARDIOLOGY OFFICE NOTE   MATSON, WELCH                       MRN:          [id number]  DATE:06/28/2007                            DOB:          1965/09/17    HISTORY OF PRESENT ILLNESS:  Mr. Kelemen was in for follow-up.  In  general, he is relatively well.  He stopped smoking in 1986.  He has not  been working quite as hard because he is not in the summer months.  But  otherwise, he has gotten along reasonably well.  He does have difficulty  controlling his appetite.   MEDICATIONS:  1. Enteric-coated aspirin 81 mg daily.  2. Multivitamin daily.  3. Benazepril 20 mg daily.  4. Digoxin 0.25 mg daily.  5. Toprol XL 25 mg daily.  6. Actos 30 mg daily.  7. Simvastatin 40 mg daily.   PHYSICAL EXAMINATION:  GENERAL:  He is alert and oriented.  VITAL SIGNS:  Weight is 289 pounds which is up approximately 11 pounds,  blood pressure is 140/78, the pulse 76.  LUNG:  Fields are clear.  The PMI is nondisplaced.  CARDIAC:  Rhythm is regular.  The extremities reveal no edema.   PLAN:  EKG reveals normal sinus rhythm is entirely within normal limits.   IMPRESSION:  1. Hypertension, under moderate control.  2. Non-insulin dependent diabetes mellitus currently on TZD.  3. Morbid obesity.  4. History of paroxysmal atrial fibrillation on low-dose Lanoxin,      Toprol, and on aspirin.   PLAN:  1. Return to clinic in one year.  2. Continue to encourage weight loss.  3. Increase enteric coated aspirin 325 mg daily.  4. Once again, we reviewed metabolic syndrome implications.  5. Lipid and liver profile.     Arturo Morton. Riley Kill, MD, [facility]  Electronically Signed    TDS/MedQ  DD: 06/29/2007  DT: 06/29/2007  Job #: [id number]

## 2010-10-08 NOTE — Assessment & Plan Note (Signed)
[facility]                            CARDIOLOGY OFFICE NOTE   Daniel Grant, Daniel Grant                       MRN:          [id number]  DATE:01/11/2007                            DOB:          [birth year]    Daniel Grant is in for followup.  In general he continues to work a little  too hard.  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  His weight has continued to climb  intermittently.  He is tired today.   His weight is 278 pounds, blood pressure __________ is 160/96 and blood  pressure when checked by me was 140/90, the pulse is 74.  LUNGS:  Fields are clear.  CARDIAC:  Rhythm is regular.  EXTREMITIES:  Reveal no lower extremity edema.   IMPRESSION:  1. Hypertension.  2. Obesity.  3. Non-insulin dependent diabetes mellitus.  4. History of probable paroxysmal atrial fibrillation, controlled by      Lanoxin and low dose Toprol.  5. Hyperlipidemia.   PLAN:  1. Check dig level, we would like it to be subtherapeutic.  2. Encouraged exercise, activity and weight loss for better glucose      and blood pressure control.  3. Return to clinic in 6 months.   ADDENDUM:  EKG is normal, intervals are normal.     Arturo Morton. Riley Kill, MD, [facility]  Electronically Signed    TDS/MedQ  DD: 01/11/2007  DT: 01/12/2007  Job #: [id number]

## 2010-10-08 NOTE — Assessment & Plan Note (Signed)
[facility]                            CARDIOLOGY OFFICE NOTE   STORY, VANVRANKEN                       MRN:          [id number]  DATE:02/23/2008                            DOB:          September 02, 1965    Daniel Grant is in for followup.  In general, he has been stable.  He has not  been having any chest pain or shortness of breath.  He continues to work  just an incredible schedule.  He has not had any sleep today whatsoever.  He is going to go to bed and then go to work tonight.  After that, he  will do many yards tomorrow and on the weekend.  He is getting some  pressure from his wife to start to spend more time at home.  He has not  had any arrhythmia.  His blood pressure has been reasonably controlled.   CURRENT MEDICATIONS:  1. Multivitamin daily.  2. Benazepril 20 mg daily.  3. Digoxin 0.25 mg daily.  4. Simvastatin 40 mg daily.  5. Aspirin 325 mg daily.  6. Metformin 500 mg 2 tablets b.i.d.  7. Metoprolol tartrate 25 mg one-half tablet b.i.d.   PHYSICAL EXAMINATION:  GENERAL:  He is alert and oriented in no acute  distress.  VITAL SIGNS:  Blood pressure is 146/98, the pulse is 64.  LUNGS:  The lung fields are clear.  CARDIAC:  Rhythm is regular.  EXTREMITIES:  Reveal no edema.   His electrocardiogram demonstrates normal sinus rhythm essentially  within normal limits.  The PR interval is normal at 150 milliseconds.   IMPRESSION:  1. History of paroxysmal atrial fibrillation without recurrence.  2. Non-insulin-dependent diabetes mellitus.  3. Hypertension, moderate control.  4. Sleep apnea.  5. Hypercholesterolemia.   RECOMMENDATIONS:  1. Continue current medical regimen.  2. Return to clinic in 6 months' time.  3. Continued followup for General Medicine at [facility].    Arturo Morton. Riley Kill, MD, [facility]  Electronically Signed   TDS/MedQ  DD: 02/23/2008  DT: 02/24/2008  Job #: [id number]

## 2010-10-09 ENCOUNTER — Ambulatory Visit (INDEPENDENT_AMBULATORY_CARE_PROVIDER_SITE_OTHER): Payer: 59 | Admitting: Emergency Medicine

## 2010-10-09 DIAGNOSIS — I4891 Unspecified atrial fibrillation: Secondary | ICD-10-CM

## 2010-10-09 LAB — POCT INR: INR: 1.7

## 2010-10-11 NOTE — Assessment & Plan Note (Signed)
[facility]                            CARDIOLOGY OFFICE NOTE   Daniel, Grant                       MRN:          [id number]  DATE:03/07/2008                            DOB:          09-06-1965    Daniel Grant presents for followup.  He really has not had any sleep today.  He continues to have somewhat crazy hours.  He works nights and then he  works all day long.  He will sleeps some before he goes to work.  Nonetheless, he does understand that this is not an optimal lifestyle.  He does have some erectile dysfunction and he has asked about the use of  Viagra, I thought this would be a reasonable option for him.  However,  we did talk about a number of things including metabolic abnormalities.   MEDICATIONS:  1. Multivitamin daily.  2. Benazepril 20 mg daily.  3. Digoxin 0.25 mg daily.  4. Actos 30 mg daily.  5. Simvastatin 40 mg daily.  6. Aspirin 325 mg daily.  7. Metformin.  8. Hydrochloride 500 mg 2 tablets b.i.d.  9. Metoprolol 25 mg one-half tablet b.i.d.   PHYSICAL EXAMINATION:  GENERAL:  He is alert and oriented.  VITAL SIGNS:  Blood pressure is 146/98, pulse is 64.  LUNG:  Fields are clear to auscultation and percussion.  CARDIAC:  Rhythm is regular.   His weight continues to remain elevated.  He is down about 12 pounds and  I would encourage him to remain on medical therapy and to try to reduce  his weight.  Weight reduction would be the optimal situation for him in  the long run.     Arturo Morton. Riley Kill, MD, [facility]  Electronically Signed    TDS/MedQ  DD: 03/07/2008  DT: 03/07/2008  Job #: [id number]

## 2010-10-11 NOTE — Discharge Summary (Signed)
[place]  Patient:    Daniel Grant                        MRN: [id number] Adm. Date:  [id number] Disc. Date: [id number] Attending:  Alric Quan Dictator:   Leonides Cave, P.A. CC:         Arturo Morton. Riley Kill, M.D. [facility]             Dr. Earlene Plater, [facility]                           Discharge Summary  DATE OF BIRTH:  [birth year]  PRIMARY CARE PHYSICIAN:  Dr. Earlene Plater.  DISCHARGE DIAGNOSIS:  Noncardiac chest pain.  BRIEF HISTORY OF PRESENT ILLNESS:  The patient is a very pleasant 45 year old black male with history of paroxysmal atrial fibrillation with no recurrent arrhythmias 1997 since being treated with Cardizem and Lanoxin. Also has a history of hypertension and diabetes which was recently diagnosed. The patient reports to he emergency room on June 20, 1999 with a two-day history of "sharp, stabbing chest pain". The patient says the pain is associated with shortness of breath and generally happens shortly after eating. He denies radiation of pain, nausea, vomiting, or diaphoresis. The patient states the pain last 30 seconds and then completely resolves. The patient went to see his primary care physician Dr. Earlene Plater today, and it was felt that the patient should be further worked up in the emergency department. At the time he was seen in the emergency department, he was pain free; and EKG revealed normal sinus rhythm with nonspecific ST-T wave abnormalities. There were no acute changes seen.  HOSPITAL COURSE:  The patient was taken to the catheterization lab on the same ay of admission by Veneda Melter, M.D. Results of the cardiac catheterization is as follows:  left main normal, LAD with two diagonals, left circumflex with two obtuse marginal branches, both main arteries were normal, RCA was dominant and normal.  There was a PDA and two PP branches coming off the RCA. All vessels are free of  coronary disease and patients  ejection fraction was greater than 55%. The patient was sent home later on in the evening on June 20, 1999 on the same medications he came in on.  DISCHARGE MEDICATIONS: 1. Avandia 4 mg q.d. 2. Coated aspirin q.d. 3. Tiazac 360 mg q.d. 4. Lanoxin 0.125 mg q.d. 5. He was also given a prescription for Protonix 40 mg p.o. q.d. for probable GI    causes of chest pain.  INSTRUCTIONS:  The patient is instructed to undergo no heavy lifting, driving, r sexual activity for two days. He is told to adhere to a strict diabetic diet. He was counseled on the benefits of an exercise program and weight loss as well. He will not need to follow up with [facility] cardiology unless he has problems in the future. He can follow up with his primary care physician Dr. Earlene Plater. DD:  06/20/99 TD:  06/20/99 Job: [id number]

## 2010-10-11 NOTE — Cardiovascular Report (Signed)
[place]  Patient:    Daniel Grant                        MRN: [id number] Proc. Date: 06/20/99 Adm. Date:  [id number] Disc. Date: [id number] Attending:  Alric Quan CC:         Veneda Melter, M.D. [facility]             Meliton Rattan, M.D.             Arturo Morton. Riley Kill, M.D. [facility]                        Cardiac Catheterization  PROCEDURES PERFORMED: 1. Left heart catheterization. 2. Left ventriculogram. 3. Perclose suture closure, right femoral artery.  DIAGNOSES: 1. Normal coronary arteries by angiogram. 2. Normal left ventricular systolic function.  HISTORY:  [name] is a 45 year old black male with multiple cardiac risk factors, who presents with sharp substernal chest discomfort.  This pain was relieved with nitroglycerin.  The patient presents now for left heart catheterization.  TECHNIQUE:  After informed consent was obtained, the patient was brought to the  cardiac catheterization laboratory where both groins were sterilely prepped and  draped.  One percent Lidocaine was used to infiltrate the right groin and a 6-French sheath was placed in the right femoral artery using the modified Seldinger technique.  Six [place] JL-4 and JR-4 catheters were used to engage the left and  right coronary arteries.  Selective angiography was performed in the various projections using manual injections of contrast.  A 6-French pigtail catheter was then advanced into the left ventricle and a left ventriculogram was performed using power injections of contrast.  At the termination of the case, a Perclose suture closure device was deployed to the right femoral artery until adequate hemostasis was achieved.  The patient tolerated the procedure well and was transferred to he holding area in stable condition.  FINDINGS: 1. Left main trunk:  This is a large caliber vessel which is angiographically    normal.  2. Left anterior descending artery:   This is large vessel that provides two medium    caliber diagonal branches in the proximal mid section and a smaller third    marginal branch distally.  The LAD is angiographically normal.  3. Left circumflex artery:  This is a medium caliber vessel that contains two    marginal branches.  The left circumflex system is angiographically normal.  4. Right coronary artery:  The right coronary artery is dominant and is a large  caliber vessel that provides a posterior descending artery as well as two    posteroventricular branches.  The right coronary system is angiographically    normal.  5. Left ventricle:  Normal end-systolic and end-diastolic dimensions.  Overall    left ventricular function is well reserved.  Ejection fraction is greater than    55%.  No mitral regurgitation.  LV pressure is 151/10, aortic 151/86, LV EDP 23.  ASSESSMENT AND PLAN:  [name] is a 45 year old gentleman with noncardiac chest pain at this point.  Other causes of his pain will be investigated. DD:  06/20/99 TD:  06/22/99 Job: [id number]

## 2010-10-11 NOTE — Assessment & Plan Note (Signed)
[facility]                            CARDIOLOGY OFFICE NOTE   DERON, POOLE                       MRN:          [id number]  DATE:06/01/2006                            DOB:          09-Feb-1966    Mr. Yasui is in for followup.  From a clinical standpoint, he is stable.  He has not been having any ongoing chest pain.  He unfortunately  continues to work nights and his weight continues to rise.  He says he  eats at night to stay awake.   On his exam today, the weight is 282 pounds, blood pressure 141/80,  pulse is 69.  The lung fields are clear and the cardiac rhythm is  regular.  No significant murmur is appreciated.   The patient has hypertension and non-insulin-dependent diabetes.  His  symptoms have resolved.  We have discussed previously whether or not he  should wear an event monitor and he, in fact, did do this but did not  have any evidence of atrial fibrillation.  His paroxysmal atrial  fibrillation has not at least clinically ever recurred.  His biggest  issues at the present time are his current lifestyle of nighttime work,  as well as work throughout the day.  This incredible work schedule has  resulted in poor dietary habits which have resulted in a substantial  weight gain, and probably increasing problems with regard to glucose  intolerance.  His EKG currently is normal without evidence of underlying  atrial fibrillation.   At the present, he will remain on the current medical regimen.  We will  likely get a lipid and liver profile and add a digoxin level to his  laboratory studies.  I will see him back in followup in about 6 months.     Arturo Morton. Riley Kill, MD, [facility]  Electronically Signed    TDS/MedQ  DD: 06/01/2006  DT: 06/01/2006  Job #: [id number]

## 2010-10-11 NOTE — Assessment & Plan Note (Signed)
[facility]                              CARDIOLOGY OFFICE NOTE   JIHAD, BROWNLOW                       MRN:          [id number]  DATE:12/03/2005                            DOB:          09/01/1965    [name] is  in for a followup visit.  He continues to work a fairly  vigorous schedule.  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  He gets to bed around 4 or 5 in the  afternoon and is able to sleep until he goes to work at 11.  He has only had  one episode of brief palpitations.  He had been drinking a lot of soft  drinks at the time.  He has had no other symptoms what so ever.   PHYSICAL EXAMINATION:  VITAL SIGNS:  On examination today, blood pressure is  138/80, pulse 74.  CHEST:  The lung fields are clear.  HEART:  The cardiac rhythm is regular.   IMPRESSION:  1.  Hypertension.  2.  Non-insulin-dependent type 2 diabetes mellitus.  3.  Question paroxysmal atrial fibrillation.   DISPOSITION:  We have had an extensive discussion.  I suggested that he  consider perhaps not working quite so hard and getting a little bit more  rest.  The patient clearly has diabetes as well as hypertension.  He has  also recently been told that he has sleep apnea.  This would fit with his  weight.  With regard to potential options, at the present time we do not  have definitive evidence of recurrent atrial fibrillation.  If the symptoms  would continue, I have encouraged him to consider getting an event monitor.  If we would document this, then given the risk factors, he might be a  candidate for warfarin anticoagulation.                              Arturo Morton. Riley Kill, MD, [facility]    TDS/MedQ  DD:  12/03/2005  DT:  12/03/2005  Job #:  [id number]   cc:   Gabriel Earing, MD

## 2010-10-20 ENCOUNTER — Other Ambulatory Visit: Payer: Self-pay | Admitting: Cardiology

## 2010-10-23 ENCOUNTER — Ambulatory Visit (INDEPENDENT_AMBULATORY_CARE_PROVIDER_SITE_OTHER): Payer: 59 | Admitting: Emergency Medicine

## 2010-10-23 DIAGNOSIS — I4891 Unspecified atrial fibrillation: Secondary | ICD-10-CM

## 2010-10-23 LAB — POCT INR: INR: 1.9

## 2010-11-13 ENCOUNTER — Encounter: Payer: Self-pay | Admitting: Cardiology

## 2010-11-13 ENCOUNTER — Ambulatory Visit (INDEPENDENT_AMBULATORY_CARE_PROVIDER_SITE_OTHER): Payer: 59 | Admitting: *Deleted

## 2010-11-13 ENCOUNTER — Ambulatory Visit (INDEPENDENT_AMBULATORY_CARE_PROVIDER_SITE_OTHER): Payer: 59 | Admitting: Cardiology

## 2010-11-13 DIAGNOSIS — I10 Essential (primary) hypertension: Secondary | ICD-10-CM

## 2010-11-13 DIAGNOSIS — I4891 Unspecified atrial fibrillation: Secondary | ICD-10-CM

## 2010-11-13 DIAGNOSIS — E7800 Pure hypercholesterolemia, unspecified: Secondary | ICD-10-CM

## 2010-11-13 LAB — POCT INR: INR: 2.4

## 2010-11-13 NOTE — Patient Instructions (Signed)
Your physician wants you to follow-up in: 6 months with Dr. Riley Kill. You will receive a reminder letter in the mail two months in advance. If you don't receive a letter, please call our office to schedule the follow-up appointment. Your physician recommends that you return for lab work in: September --week of February 10, 2011.

## 2010-11-20 ENCOUNTER — Encounter: Payer: 59 | Admitting: Emergency Medicine

## 2010-11-27 NOTE — Progress Notes (Signed)
HPI:  He is doing ok.  We reviewed his situation.  He is not really been out of rhythm, and has been tolerating his medications.  Still working hard.   Weight issues again discussed.   Current Outpatient Prescriptions  Medication Sig Dispense Refill  . amLODipine (NORVASC) 10 MG tablet Take 10 mg by mouth daily.        Marland Kitchen aspirin 81 MG EC tablet Take 81 mg by mouth daily.        . benazepril (LOTENSIN) 20 MG tablet Take 20 mg by mouth daily.        . digoxin (LANOXIN) 0.125 MG tablet Take 125 mcg by mouth daily.        . flecainide (TAMBOCOR) 100 MG tablet Take 100 mg by mouth 2 (two) times daily.        . fluticasone (FLONASE) 50 MCG/ACT nasal spray 2 sprays by Nasal route daily.        . hydrochlorothiazide (HYDRODIURIL) 12.5 MG tablet Take 12.5 mg by mouth daily.        . metFORMIN (GLUMETZA) 500 MG (MOD) 24 hr tablet Take 1,000 mg by mouth daily with breakfast.        . metoprolol (TOPROL-XL) 100 MG 24 hr tablet Take 100 mg by mouth daily.        . Multiple Vitamin (MULTIVITAMIN) tablet Take 1 tablet by mouth daily.        . pravastatin (PRAVACHOL) 40 MG tablet Take 40 mg by mouth daily.        . sildenafil (VIAGRA) 50 MG tablet Take 50 mg by mouth daily as needed.        . warfarin (COUMADIN) 10 MG tablet TAKE AS DIRECTED BY  COUMADIN  CLINIC  40 tablet  3    No Known Allergies  Past Medical History  Diagnosis Date  . Pure hypercholesterolemia   . Unspecified sleep apnea   . Hypertension   . Type II or unspecified type diabetes mellitus without mention of complication, not stated as uncontrolled   . Paroxysmal atrial fibrillation   . Obesity   . Erectile dysfunction     Past Surgical History  Procedure Date  . Cardiac catheterization   . Finger surgery     left 1st finger    Family History  Problem Relation Age of Onset  . Heart attack Paternal Uncle   . Heart attack Maternal Grandmother     History   Social History  . Marital Status: Married    Spouse Name: N/A      Number of Children: N/A  . Years of Education: N/A   Occupational History  . Not on file.   Social History Main Topics  . Smoking status: Former Games developer  . Smokeless tobacco: Never Used   Comment: quit in 1995  . Alcohol Use: No  . Drug Use: No  . Sexually Active: Not on file   Other Topics Concern  . Not on file   Social History Narrative  . No narrative on file    ROS: Please see the HPI.  All other systems reviewed and negative.  PHYSICAL EXAM:  BP 108/72  Pulse 61  Ht 5\' 10"  (1.778 m)  Wt 270 lb (122.471 kg)  BMI 38.74 kg/m2  General: Well developed, well nourished, in no acute distress. Head:  Normocephalic and atraumatic. Neck: no JVD Lungs: Clear to auscultation and percussion. Heart: Normal S1 and S2.  No murmur, rubs or gallops.  Abdomen:  Normal bowel sounds; soft; non tender; no organomegaly Pulses: Pulses normal in all 4 extremities. Extremities: No clubbing or cyanosis. No edema. Neurologic: Alert and oriented x 3.  EKG:  NSR. Early repolarization.  ASSESSMENT AND PLAN:

## 2010-11-27 NOTE — Assessment & Plan Note (Signed)
Taking Flecainide at the present time.  Seems to be doing a good job of keeping him in rhythm.

## 2010-11-27 NOTE — Assessment & Plan Note (Signed)
Seems controlled at present.

## 2010-11-27 NOTE — Assessment & Plan Note (Signed)
Near target with low dose pravachol.

## 2010-12-04 ENCOUNTER — Ambulatory Visit (INDEPENDENT_AMBULATORY_CARE_PROVIDER_SITE_OTHER): Payer: 59 | Admitting: Emergency Medicine

## 2010-12-04 DIAGNOSIS — I4891 Unspecified atrial fibrillation: Secondary | ICD-10-CM

## 2010-12-04 LAB — POCT INR: INR: 2.7

## 2011-01-01 ENCOUNTER — Ambulatory Visit (INDEPENDENT_AMBULATORY_CARE_PROVIDER_SITE_OTHER): Payer: 59 | Admitting: Emergency Medicine

## 2011-01-01 DIAGNOSIS — I4891 Unspecified atrial fibrillation: Secondary | ICD-10-CM

## 2011-01-01 LAB — POCT INR: INR: 2.5

## 2011-01-01 MED ORDER — WARFARIN SODIUM 10 MG PO TABS: ORAL_TABLET | ORAL | Status: DC

## 2011-01-14 ENCOUNTER — Encounter: Payer: Self-pay | Admitting: Cardiology

## 2011-01-29 ENCOUNTER — Ambulatory Visit (INDEPENDENT_AMBULATORY_CARE_PROVIDER_SITE_OTHER): Payer: 59 | Admitting: Emergency Medicine

## 2011-01-29 DIAGNOSIS — I4891 Unspecified atrial fibrillation: Secondary | ICD-10-CM

## 2011-01-29 LAB — POCT INR: INR: 5.6

## 2011-02-12 ENCOUNTER — Ambulatory Visit (INDEPENDENT_AMBULATORY_CARE_PROVIDER_SITE_OTHER): Payer: 59 | Admitting: Emergency Medicine

## 2011-02-12 DIAGNOSIS — I4891 Unspecified atrial fibrillation: Secondary | ICD-10-CM

## 2011-02-12 LAB — POCT INR: INR: 2.8

## 2011-02-13 ENCOUNTER — Other Ambulatory Visit: Payer: 59 | Admitting: *Deleted

## 2011-03-12 ENCOUNTER — Ambulatory Visit (INDEPENDENT_AMBULATORY_CARE_PROVIDER_SITE_OTHER): Payer: 59 | Admitting: Emergency Medicine

## 2011-03-12 DIAGNOSIS — I4891 Unspecified atrial fibrillation: Secondary | ICD-10-CM

## 2011-03-12 LAB — POCT INR: INR: 2.7

## 2011-03-26 ENCOUNTER — Ambulatory Visit (INDEPENDENT_AMBULATORY_CARE_PROVIDER_SITE_OTHER): Payer: 59 | Admitting: Emergency Medicine

## 2011-03-26 DIAGNOSIS — I4891 Unspecified atrial fibrillation: Secondary | ICD-10-CM

## 2011-03-26 LAB — POCT INR: INR: 3.9

## 2011-04-09 ENCOUNTER — Ambulatory Visit (INDEPENDENT_AMBULATORY_CARE_PROVIDER_SITE_OTHER): Payer: 59 | Admitting: Emergency Medicine

## 2011-04-09 DIAGNOSIS — I4891 Unspecified atrial fibrillation: Secondary | ICD-10-CM

## 2011-04-09 LAB — POCT INR: INR: 3.7

## 2011-04-23 ENCOUNTER — Ambulatory Visit (INDEPENDENT_AMBULATORY_CARE_PROVIDER_SITE_OTHER): Payer: 59 | Admitting: Emergency Medicine

## 2011-04-23 DIAGNOSIS — I4891 Unspecified atrial fibrillation: Secondary | ICD-10-CM

## 2011-04-23 LAB — POCT INR: INR: 2.5

## 2011-04-25 ENCOUNTER — Other Ambulatory Visit: Payer: Self-pay | Admitting: Cardiology

## 2011-04-25 NOTE — Telephone Encounter (Signed)
.   Requested Prescriptions   Pending Prescriptions Disp Refills  . amLODipine (NORVASC) 10 MG tablet [Pharmacy Med Name: AMLODIPINE 10MG      TAB] 90 tablet 4    Sig: TAKE ONE TABLET BY MOUTH EVERY DAY

## 2011-05-01 ENCOUNTER — Encounter: Payer: Self-pay | Admitting: Cardiology

## 2011-05-01 ENCOUNTER — Ambulatory Visit (INDEPENDENT_AMBULATORY_CARE_PROVIDER_SITE_OTHER): Payer: 59 | Admitting: Cardiology

## 2011-05-01 VITALS — BP 118/66 | HR 66 | Ht 70.0 in | Wt 279.0 lb

## 2011-05-01 DIAGNOSIS — I4891 Unspecified atrial fibrillation: Secondary | ICD-10-CM

## 2011-05-01 DIAGNOSIS — I10 Essential (primary) hypertension: Secondary | ICD-10-CM

## 2011-05-01 DIAGNOSIS — E7800 Pure hypercholesterolemia, unspecified: Secondary | ICD-10-CM

## 2011-05-01 DIAGNOSIS — G4730 Sleep apnea, unspecified: Secondary | ICD-10-CM

## 2011-05-01 MED ORDER — AMLODIPINE BESYLATE 10 MG PO TABS: 10.0000 mg | ORAL_TABLET | Freq: Every day | ORAL | Status: DC

## 2011-05-01 MED ORDER — DIGOXIN 125 MCG PO TABS: 125.0000 ug | ORAL_TABLET | Freq: Every day | ORAL | Status: DC

## 2011-05-01 MED ORDER — PRAVASTATIN SODIUM 40 MG PO TABS: 40.0000 mg | ORAL_TABLET | Freq: Every day | ORAL | Status: DC

## 2011-05-01 MED ORDER — BENAZEPRIL HCL 20 MG PO TABS: 20.0000 mg | ORAL_TABLET | Freq: Every day | ORAL | Status: DC

## 2011-05-01 MED ORDER — HYDROCHLOROTHIAZIDE 12.5 MG PO TABS: 12.5000 mg | ORAL_TABLET | Freq: Every day | ORAL | Status: DC

## 2011-05-01 MED ORDER — METOPROLOL SUCCINATE ER 100 MG PO TB24: 100.0000 mg | ORAL_TABLET | Freq: Every day | ORAL | Status: DC

## 2011-05-01 MED ORDER — FLECAINIDE ACETATE 100 MG PO TABS: 100.0000 mg | ORAL_TABLET | Freq: Two times a day (BID) | ORAL | Status: DC

## 2011-05-01 NOTE — Patient Instructions (Signed)
Your physician wants you to follow-up in: 6 MONTHS.  You will receive a reminder letter in the mail two months in advance. If you don't receive a letter, please call our office to schedule the follow-up appointment.  Your physician recommends that you continue on your current medications as directed. Please refer to the Current Medication list given to you today.  

## 2011-05-02 NOTE — Progress Notes (Deleted)
Patient ID: Daniel L Kroeger, male   DOB: 12/04/1965, 45 y.o.   MRN: [id number]  

## 2011-05-02 NOTE — Progress Notes (Signed)
Patient ID: Daniel Grant, male   DOB: May 18, 1966, 45 y.o.   MRN: [id number]

## 2011-05-04 NOTE — Progress Notes (Signed)
HPI:  Daniel Grant is in for a follow up visit.  He is doing well.  We had a good discussion today about his risk factors.  He is tolerating CPAP, and his daytime fatigue has largely resolved.  He denies any ongoing chest pain and is tolerating his meds well.  I gave him an assignment today, namely to review the word BMI.  We talked about the implications of his BMI, and I gave him a corner of the progress note with the specific BMI on it.  He promised he would review it, and stay focused on it.    Current Outpatient Prescriptions  Medication Sig Dispense Refill  . amLODipine (NORVASC) 10 MG tablet Take 1 tablet (10 mg total) by mouth daily.  90 tablet  4  . aspirin 81 MG EC tablet Take 81 mg by mouth daily.        . benazepril (LOTENSIN) 20 MG tablet Take 1 tablet (20 mg total) by mouth daily.  90 tablet  3  . digoxin (LANOXIN) 0.125 MG tablet Take 1 tablet (125 mcg total) by mouth daily.  90 tablet  4  . flecainide (TAMBOCOR) 100 MG tablet Take 1 tablet (100 mg total) by mouth 2 (two) times daily.  180 tablet  3  . fluticasone (FLONASE) 50 MCG/ACT nasal spray 2 sprays by Nasal route daily.        . hydrochlorothiazide (HYDRODIURIL) 12.5 MG tablet Take 1 tablet (12.5 mg total) by mouth daily.  90 tablet  3  . metFORMIN (GLUMETZA) 500 MG (MOD) 24 hr tablet Take 1,000 mg by mouth daily with breakfast.        . metoprolol (TOPROL-XL) 100 MG 24 hr tablet Take 1 tablet (100 mg total) by mouth daily.  90 tablet  3  . Multiple Vitamin (MULTIVITAMIN) tablet Take 1 tablet by mouth daily.        . pravastatin (PRAVACHOL) 40 MG tablet Take 1 tablet (40 mg total) by mouth daily.  90 tablet  4  . sildenafil (VIAGRA) 50 MG tablet Take 50 mg by mouth daily as needed.        . warfarin (COUMADIN) 10 MG tablet Take as directed by Anticoagulation clinic   120 tablet  1    No Known Allergies  Past Medical History  Diagnosis Date  . Pure hypercholesterolemia   . Unspecified sleep apnea   . Hypertension   .  Type II or unspecified type diabetes mellitus without mention of complication, not stated as uncontrolled   . Paroxysmal atrial fibrillation   . Obesity   . Erectile dysfunction     Past Surgical History  Procedure Date  . Cardiac catheterization   . Finger surgery     left 1st finger    Family History  Problem Relation Age of Onset  . Heart attack Paternal Uncle   . Heart attack Maternal Grandmother     History   Social History  . Marital Status: Married    Spouse Name: N/A    Number of Children: N/A  . Years of Education: N/A   Occupational History  . Not on file.   Social History Main Topics  . Smoking status: Former Games developer  . Smokeless tobacco: Never Used   Comment: quit in 1995  . Alcohol Use: No  . Drug Use: No  . Sexually Active: Not on file   Other Topics Concern  . Not on file   Social History Narrative  . No narrative  on file    ROS: Please see the HPI.  All other systems reviewed and negative.  PHYSICAL EXAM:  BP 118/66  Pulse 66  Ht 5\' 10"  (1.778 m)  Wt 126.554 kg (279 lb)  BMI 40.03 kg/m2  General: Well developed, but obese gentleman, in no acute distress. Head:  Normocephalic and atraumatic. Neck: no JVD Lungs: Clear to auscultation and percussion.   Heart: Normal S1 and S2.  No murmur, rubs or gallops.  HS are distant Abdomen:  Normal bowel sounds; soft; non tender; no organomegaly Pulses: Pulses normal in all 4 extremities. Extremities: No clubbing or cyanosis. No edema. Neurologic: Alert and oriented x 3.  EKG:  NSR.  Repole changes.  Reviewed and compared to prior tracings.  QTc within normal.   ASSESSMENT AND PLAN:

## 2011-05-04 NOTE — Assessment & Plan Note (Signed)
Currently getting CPAP, and seems improved from a symptomatic standpoint.

## 2011-05-04 NOTE — Assessment & Plan Note (Addendum)
On medical therapy.  May have improved with CPAP.  Tolerates medications.   Continue for now. Will have him get dig level, BMET, and CBC.

## 2011-05-04 NOTE — Assessment & Plan Note (Signed)
Reasonable control.  This may be due to better control of his CPAP.  Hopefully, this will stay in the normal range.

## 2011-05-04 NOTE — Assessment & Plan Note (Signed)
We discussed his BMI.

## 2011-05-04 NOTE — Assessment & Plan Note (Signed)
LDL near target.  HDL is somewhat low.  Tolerating pravastatin.  Will recheck in 6 months follow up visit--one year.

## 2011-05-05 ENCOUNTER — Telehealth: Payer: Self-pay

## 2011-05-05 NOTE — Telephone Encounter (Signed)
Lauren Can you have him come in for a CBC, BMET, and dig level. Thanks. The dig level should be done in the afternoon, 8 hours after dose.

## 2011-05-07 ENCOUNTER — Telehealth: Payer: Self-pay | Admitting: *Deleted

## 2011-05-07 DIAGNOSIS — I4891 Unspecified atrial fibrillation: Secondary | ICD-10-CM

## 2011-05-07 DIAGNOSIS — R0602 Shortness of breath: Secondary | ICD-10-CM

## 2011-05-07 DIAGNOSIS — I10 Essential (primary) hypertension: Secondary | ICD-10-CM

## 2011-05-07 NOTE — Telephone Encounter (Signed)
Pt coming for labs 05-14-11

## 2011-05-07 NOTE — Telephone Encounter (Signed)
Spoke with pt wife, aware dr Riley Kill would like the pt to come to the office for blood work

## 2011-05-14 ENCOUNTER — Ambulatory Visit (INDEPENDENT_AMBULATORY_CARE_PROVIDER_SITE_OTHER): Payer: 59 | Admitting: *Deleted

## 2011-05-14 ENCOUNTER — Other Ambulatory Visit (INDEPENDENT_AMBULATORY_CARE_PROVIDER_SITE_OTHER): Payer: 59 | Admitting: *Deleted

## 2011-05-14 DIAGNOSIS — R0602 Shortness of breath: Secondary | ICD-10-CM

## 2011-05-14 DIAGNOSIS — I4891 Unspecified atrial fibrillation: Secondary | ICD-10-CM

## 2011-05-14 DIAGNOSIS — I10 Essential (primary) hypertension: Secondary | ICD-10-CM

## 2011-05-14 LAB — CBC WITH DIFFERENTIAL/PLATELET
Basophils Absolute: 0 10*3/uL (ref 0.0–0.1)
Basophils Relative: 0.3 % (ref 0.0–3.0)
Eosinophils Absolute: 0.4 10*3/uL (ref 0.0–0.7)
Eosinophils Relative: 2.9 % (ref 0.0–5.0)
HCT: 42.1 % (ref 39.0–52.0)
Hemoglobin: 14.6 g/dL (ref 13.0–17.0)
Lymphocytes Relative: 20.6 % (ref 12.0–46.0)
Lymphs Abs: 2.5 10*3/uL (ref 0.7–4.0)
MCHC: 34.6 g/dL (ref 30.0–36.0)
MCV: 84.8 fl (ref 78.0–100.0)
Monocytes Absolute: 0.9 10*3/uL (ref 0.1–1.0)
Monocytes Relative: 7.5 % (ref 3.0–12.0)
Neutro Abs: 8.3 10*3/uL — ABNORMAL HIGH (ref 1.4–7.7)
Neutrophils Relative %: 68.7 % (ref 43.0–77.0)
Platelets: 255 10*3/uL (ref 150.0–400.0)
RBC: 4.97 Mil/uL (ref 4.22–5.81)
RDW: 13.4 % (ref 11.5–14.6)
WBC: 12 10*3/uL — ABNORMAL HIGH (ref 4.5–10.5)

## 2011-05-14 LAB — BASIC METABOLIC PANEL
BUN: 20 mg/dL (ref 6–23)
CO2: 27 mEq/L (ref 19–32)
Calcium: 9.2 mg/dL (ref 8.4–10.5)
Chloride: 107 mEq/L (ref 96–112)
Creatinine, Ser: 1 mg/dL (ref 0.4–1.5)
GFR: 100.36 mL/min (ref >60.00)
Glucose, Bld: 144 mg/dL — ABNORMAL HIGH (ref 70–99)
Potassium: 4.1 mEq/L (ref 3.5–5.1)
Sodium: 142 mEq/L (ref 135–145)

## 2011-05-14 LAB — POCT INR: INR: 1.7

## 2011-05-15 LAB — DIGOXIN LEVEL: Digoxin Level: 0.4 ng/mL — ABNORMAL LOW (ref 0.8–2.0)

## 2011-05-21 ENCOUNTER — Encounter: Payer: 59 | Admitting: *Deleted

## 2011-06-04 ENCOUNTER — Ambulatory Visit (INDEPENDENT_AMBULATORY_CARE_PROVIDER_SITE_OTHER): Payer: 59 | Admitting: Emergency Medicine

## 2011-06-04 DIAGNOSIS — I4891 Unspecified atrial fibrillation: Secondary | ICD-10-CM

## 2011-06-04 LAB — POCT INR: INR: 2

## 2011-06-23 IMAGING — CR DG CHEST 2V
2 series · 2 of 2 positions shown · non-contrast
Comparison: None

CLINICAL DATA: Atrial fibrillation, tachycardia and shortness of
breath.

CHEST - 2 VIEW

[w chest pa]
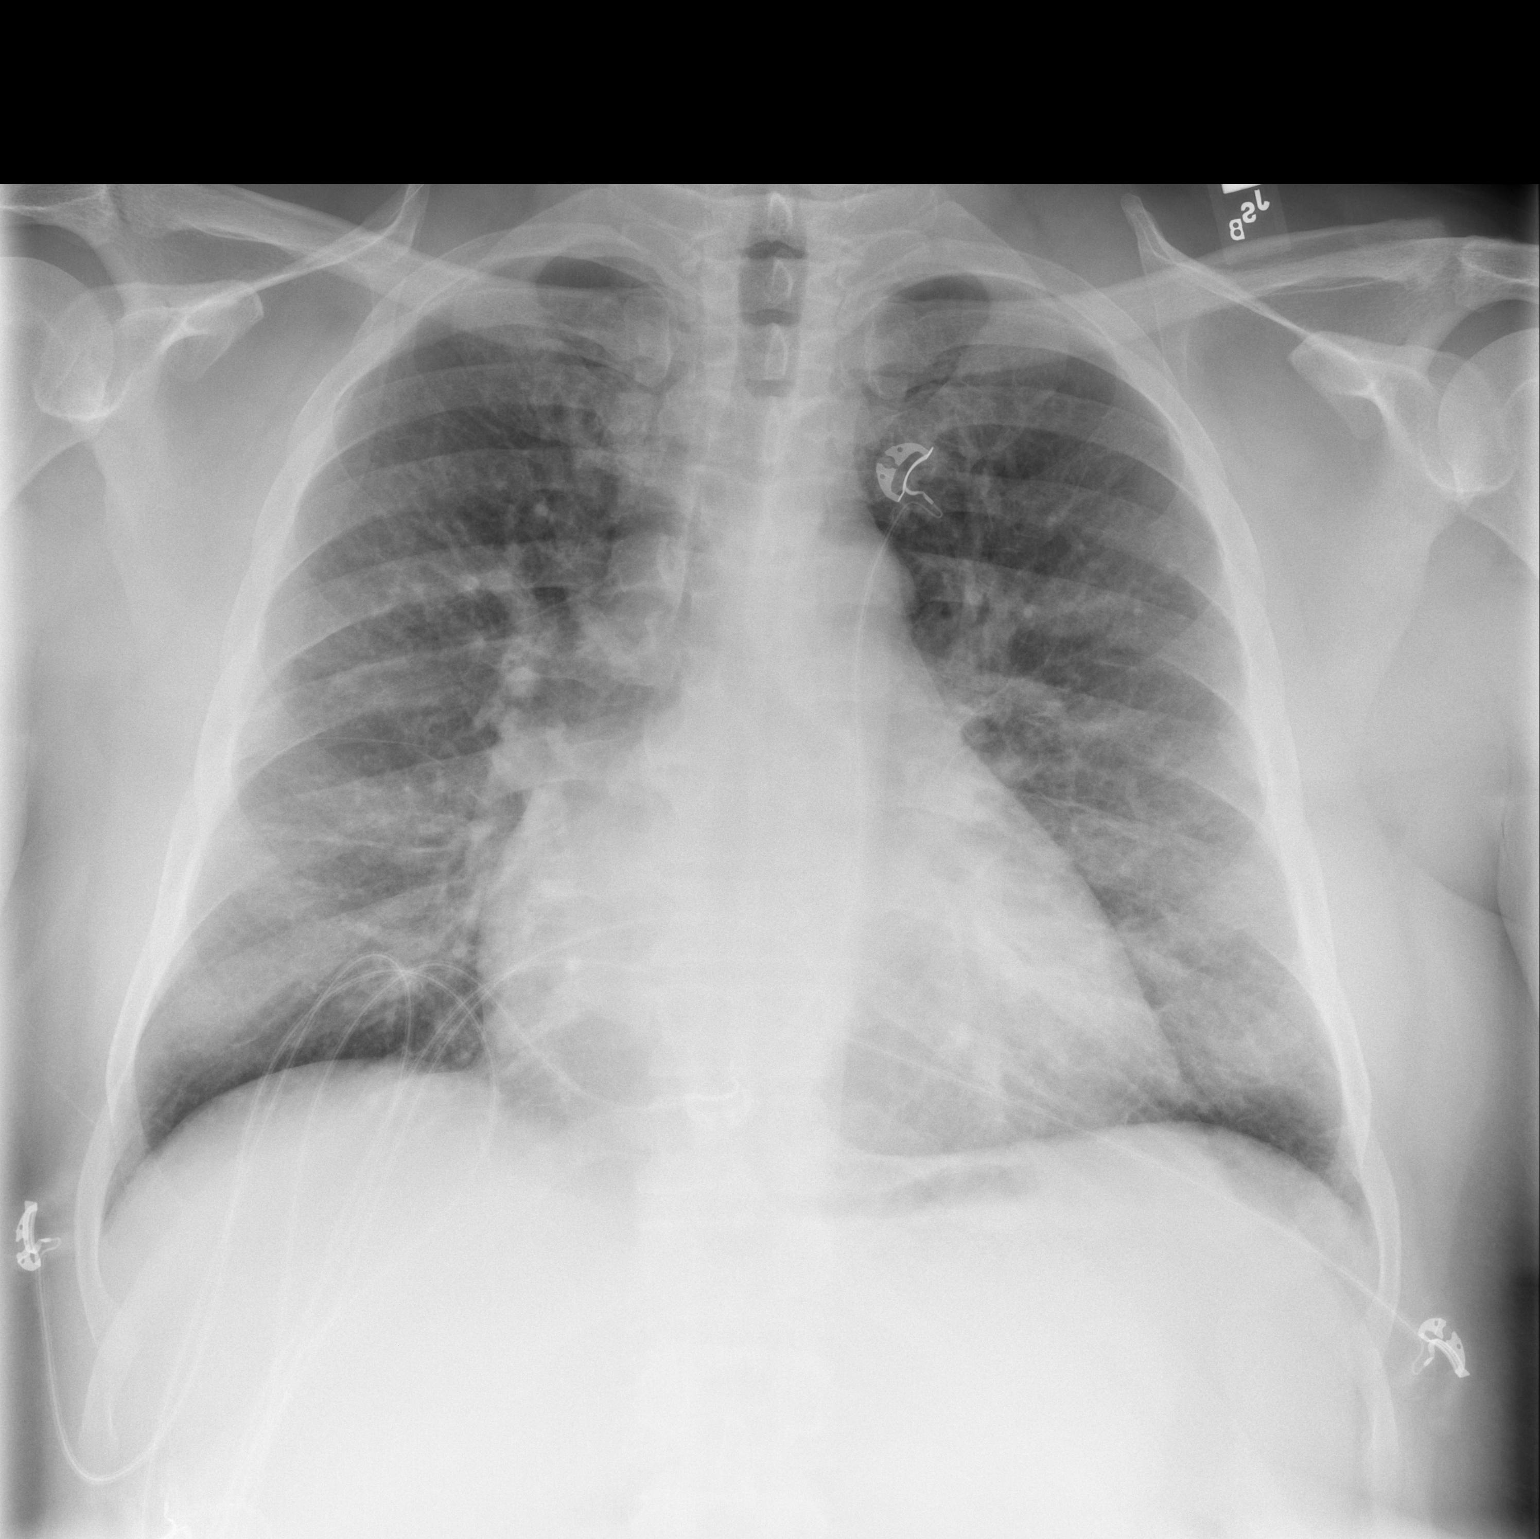

[w chest lat]
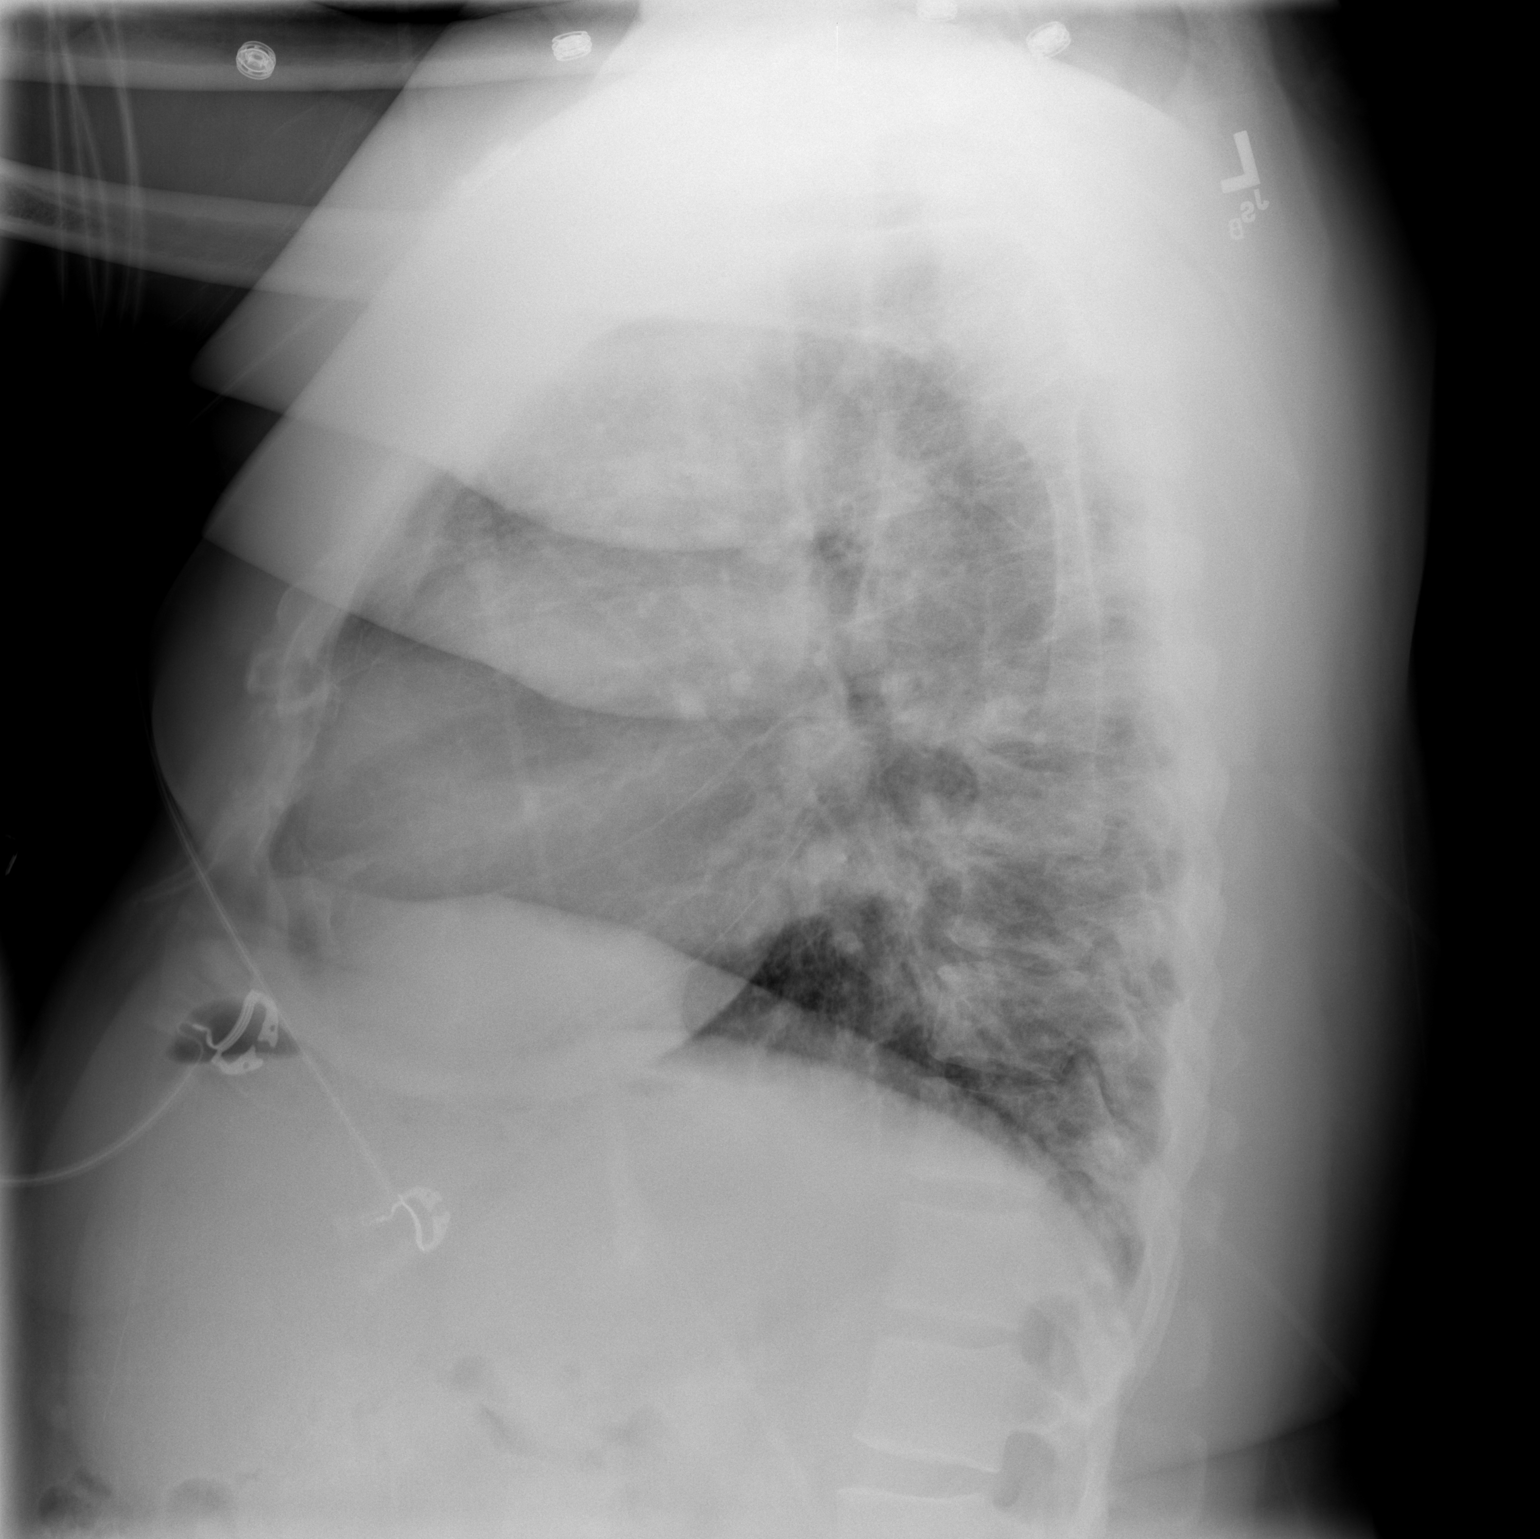

[2 of 2 positions shown; findings below may reference images not displayed]

FINDINGS: Cardiomegaly and pulmonary vascular congestion
identified.
There is no evidence of focal airspace disease, pulmonary edema,
pleural effusion, or pneumothorax.
No acute bony abnormalities are identified.
IMPRESSION: Cardiomegaly with pulmonary vascular congestion.

## 2011-06-30 ENCOUNTER — Ambulatory Visit (INDEPENDENT_AMBULATORY_CARE_PROVIDER_SITE_OTHER): Payer: 59 | Admitting: Internal Medicine

## 2011-06-30 ENCOUNTER — Encounter: Payer: Self-pay | Admitting: Internal Medicine

## 2011-06-30 VITALS — BP 128/80 | HR 66 | Temp 98.4°F | Ht 70.0 in | Wt 286.0 lb

## 2011-06-30 DIAGNOSIS — I10 Essential (primary) hypertension: Secondary | ICD-10-CM

## 2011-06-30 DIAGNOSIS — E7800 Pure hypercholesterolemia, unspecified: Secondary | ICD-10-CM

## 2011-06-30 DIAGNOSIS — I4891 Unspecified atrial fibrillation: Secondary | ICD-10-CM

## 2011-06-30 DIAGNOSIS — E119 Type 2 diabetes mellitus without complications: Secondary | ICD-10-CM

## 2011-06-30 DIAGNOSIS — Z7901 Long term (current) use of anticoagulants: Secondary | ICD-10-CM

## 2011-06-30 LAB — POCT INR: INR: 2.2

## 2011-06-30 NOTE — Assessment & Plan Note (Addendum)
Previously  followup by [facility]  Good medication compliance and apparently good control. Labs

## 2011-06-30 NOTE — Assessment & Plan Note (Signed)
Has his INR checked usually in [place].  Next inr due  in 2 days, request to check it today. Will do and forward results to the Coumadin clinic.

## 2011-06-30 NOTE — Patient Instructions (Addendum)
Same coumadin until you hear from the  coumadin clinic

## 2011-06-30 NOTE — Assessment & Plan Note (Signed)
Well controlled, last BMP normal. No change 

## 2011-06-30 NOTE — Assessment & Plan Note (Signed)
On Pravachol, last FLP last year, LDL less than 100. Recheck a cholesterol panel on next office visit

## 2011-06-30 NOTE — Progress Notes (Signed)
  Subjective:    Patient ID: Daniel Grant, male    DOB: [birth year], 46 y.o.   MRN: [id number]  HPI New patient , needs to get established. Previous PCP [facility] Atrial  fibrillation, closely followup by cardiology for many years. Hypertension, ambulatory BPs within normal, good medication compliance. 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   Cardiovascular: Negative for chest pain and leg swelling.  Gastrointestinal: Negative for abdominal pain and blood in stool.            Objective:   Physical Exam  Constitutional: He is oriented to person, place, and time. He appears well-developed. No distress.  HENT:  Head: Normocephalic and atraumatic.  Cardiovascular: Normal rate, regular rhythm and normal heart sounds.   No murmur heard. Pulmonary/Chest: Effort normal and breath sounds normal. No respiratory distress. He has no wheezes. He has no rales.  Musculoskeletal: He exhibits no edema.  Neurological: He is alert and oriented to person, place, and time.  Skin: Skin is warm and dry. He is not diaphoretic.          Assessment & Plan:  Pt has > 100 pages of old records from the last 10 years, I reviewed them  And will be sent to a paper chart for future reference. Some important information : I don't see a colonoscopy report or immunization  records Microalbumin has been consistently normal 03/27/2010 PSA 0.4 10/26/2010 creatinine 0.8, potassium 4.3, LFTs normal. Total cholesterol 122, triglycerides 121, HDL 37, LDL 61

## 2011-07-01 ENCOUNTER — Ambulatory Visit (INDEPENDENT_AMBULATORY_CARE_PROVIDER_SITE_OTHER): Payer: Self-pay | Admitting: Cardiology

## 2011-07-01 DIAGNOSIS — I4891 Unspecified atrial fibrillation: Secondary | ICD-10-CM

## 2011-07-01 DIAGNOSIS — R0989 Other specified symptoms and signs involving the circulatory and respiratory systems: Secondary | ICD-10-CM

## 2011-07-01 LAB — HEMOGLOBIN A1C: Hgb A1c MFr Bld: 6.9 % — ABNORMAL HIGH (ref 4.6–6.5)

## 2011-07-02 ENCOUNTER — Encounter: Payer: 59 | Admitting: Emergency Medicine

## 2011-07-02 MED ORDER — METFORMIN HCL ER (MOD) 500 MG PO TB24: 1000.0000 mg | ORAL_TABLET | Freq: Every day | ORAL | Status: DC

## 2011-07-03 ENCOUNTER — Encounter: Payer: Self-pay | Admitting: *Deleted

## 2011-07-30 ENCOUNTER — Ambulatory Visit (INDEPENDENT_AMBULATORY_CARE_PROVIDER_SITE_OTHER): Payer: 59

## 2011-07-30 DIAGNOSIS — I4891 Unspecified atrial fibrillation: Secondary | ICD-10-CM

## 2011-07-30 LAB — POCT INR: INR: 1.9

## 2011-08-02 ENCOUNTER — Other Ambulatory Visit: Payer: Self-pay | Admitting: Cardiology

## 2011-08-27 ENCOUNTER — Ambulatory Visit (INDEPENDENT_AMBULATORY_CARE_PROVIDER_SITE_OTHER): Payer: 59

## 2011-08-27 DIAGNOSIS — I4891 Unspecified atrial fibrillation: Secondary | ICD-10-CM

## 2011-08-27 LAB — POCT INR: INR: 1.6

## 2011-08-29 ENCOUNTER — Telehealth: Payer: Self-pay | Admitting: Internal Medicine

## 2011-08-29 NOTE — Telephone Encounter (Signed)
Yes Dr. Drue Novel will do his DOT cpe. We do not read sleep apnea machines. You can call pt & schedule a CPE. Thanks!

## 2011-08-29 NOTE — Telephone Encounter (Signed)
Patient has several questions 1-Can Dr. Drue Novel preform his DOT physical?  2-Can anyone here read the hours on his Sleep Apnea Machine?  3-If we do none of the above, can he get a Release form to have his DOT Physical & have his Sleep Apnea machine read?  Patients ph# [phone] FYI His DOT card expires 4.13, so he will need all of this done by mid to late next week.

## 2011-09-01 NOTE — Telephone Encounter (Signed)
He will need cardiology clearence for his DOT

## 2011-09-02 NOTE — Telephone Encounter (Signed)
Patient has CPE for 5.13. Wt paz Called patient back & advised on sleep apnea machine-smc

## 2011-09-03 ENCOUNTER — Ambulatory Visit (INDEPENDENT_AMBULATORY_CARE_PROVIDER_SITE_OTHER): Payer: 59 | Admitting: Internal Medicine

## 2011-09-03 VITALS — BP 130/82 | HR 67 | Temp 98.3°F | Wt 284.0 lb

## 2011-09-03 DIAGNOSIS — Z025 Encounter for examination for participation in sport: Secondary | ICD-10-CM

## 2011-09-03 DIAGNOSIS — Z0289 Encounter for other administrative examinations: Secondary | ICD-10-CM

## 2011-09-03 NOTE — Progress Notes (Signed)
  Subjective:    Patient ID: Daniel Grant, male    DOB: 08/27/1965, 46 y.o.   MRN: [id number]  HPI  Here for a DOT, did not bring a current form, will come back another day with appropiate paper workReview of Systems     Objective:   Physical Exam        Assessment & Plan:

## 2011-09-08 ENCOUNTER — Ambulatory Visit (INDEPENDENT_AMBULATORY_CARE_PROVIDER_SITE_OTHER): Payer: 59 | Admitting: Internal Medicine

## 2011-09-08 VITALS — BP 130/78 | HR 64 | Temp 97.8°F | Ht 70.0 in | Wt 283.0 lb

## 2011-09-08 DIAGNOSIS — I4891 Unspecified atrial fibrillation: Secondary | ICD-10-CM

## 2011-09-08 DIAGNOSIS — Z0000 Encounter for general adult medical examination without abnormal findings: Secondary | ICD-10-CM

## 2011-09-08 DIAGNOSIS — Z23 Encounter for immunization: Secondary | ICD-10-CM

## 2011-09-08 LAB — POCT URINALYSIS DIPSTICK
Bilirubin, UA: NEGATIVE
Blood, UA: NEGATIVE
Glucose, UA: NEGATIVE
Ketones, UA: NEGATIVE
Leukocytes, UA: NEGATIVE
Nitrite, UA: NEGATIVE
Protein, UA: NEGATIVE
Spec Grav, UA: 1.025
Urobilinogen, UA: 0.2
pH, UA: 6

## 2011-09-08 LAB — POCT INR: INR: 1.8

## 2011-09-08 MED ORDER — METFORMIN HCL ER (MOD) 500 MG PO TB24: 1000.0000 mg | ORAL_TABLET | Freq: Every day | ORAL | Status: DC

## 2011-09-08 NOTE — Assessment & Plan Note (Addendum)
lastTd ? Gave one today Never had a cscope  Diet-exercise discussed  Labs  DOT forms completed

## 2011-09-08 NOTE — Patient Instructions (Signed)
Come back fasting FLP, CBC, AST, ALT--- dx V70 Hemoglobin A1c--- dx  Diabetes Please be sure you have an appointment to see me in 3 months for a routine checkup

## 2011-09-08 NOTE — Progress Notes (Signed)
  Subjective:    Patient ID: Daniel Grant, male    DOB: [birth year], 46 y.o.   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   Cardiovascular: Negative for chest pain. Leg swelling: some LE edema with inactivity   Genitourinary: Negative for dysuria, hematuria and difficulty urinating.  Psychiatric/Behavioral:       No depression or anxiety       Objective:   Physical Exam General -- alert, well-developed, and overweight appearing.  Neck --no thyromegaly , normal carotid pulse Lungs -- normal respiratory effort, no intercostal retractions, no accessory muscle use, and normal breath sounds.   Heart-- normal rate, regular rhythm, no murmur, and no gallop.   Abdomen--soft, non-tender, no distention, no masses, no HSM, no guarding, and no rigidity.   Extremities-- no pretibial edema bilaterally Neurologic-- alert & oriented X3 and strength normal in all extremities. Psych-- Cognition and judgment appear intact. Alert and cooperative with normal attention span and concentration.  not anxious appearing and not depressed appearing.      Assessment & Plan:

## 2011-09-09 ENCOUNTER — Ambulatory Visit: Payer: Self-pay | Admitting: Cardiology

## 2011-09-09 ENCOUNTER — Encounter: Payer: Self-pay | Admitting: Internal Medicine

## 2011-09-09 ENCOUNTER — Telehealth: Payer: Self-pay | Admitting: Internal Medicine

## 2011-09-09 DIAGNOSIS — I4891 Unspecified atrial fibrillation: Secondary | ICD-10-CM

## 2011-09-09 MED ORDER — METFORMIN HCL ER 500 MG PO TB24: 1000.0000 mg | ORAL_TABLET | Freq: Every day | ORAL | Status: DC

## 2011-09-09 NOTE — Telephone Encounter (Signed)
Fax received from [facility] stating Generic Glucophage XR 500MG  prescription would be $10.00 Glumetza is [phone] Can we send for generic for this patient

## 2011-09-09 NOTE — Telephone Encounter (Signed)
Ok to change, same mg and sig

## 2011-09-09 NOTE — Telephone Encounter (Signed)
Ok to fill the glucophage xr 500mg ?

## 2011-09-09 NOTE — Telephone Encounter (Signed)
Refill done.  

## 2011-09-10 ENCOUNTER — Other Ambulatory Visit (INDEPENDENT_AMBULATORY_CARE_PROVIDER_SITE_OTHER): Payer: 59

## 2011-09-10 DIAGNOSIS — I10 Essential (primary) hypertension: Secondary | ICD-10-CM

## 2011-09-10 DIAGNOSIS — I4891 Unspecified atrial fibrillation: Secondary | ICD-10-CM

## 2011-09-10 DIAGNOSIS — Z0000 Encounter for general adult medical examination without abnormal findings: Secondary | ICD-10-CM

## 2011-09-10 LAB — CBC WITH DIFFERENTIAL/PLATELET
Basophils Absolute: 0 10*3/uL (ref 0.0–0.1)
Basophils Relative: 0.5 % (ref 0.0–3.0)
Eosinophils Absolute: 0.2 10*3/uL (ref 0.0–0.7)
Eosinophils Relative: 2.7 % (ref 0.0–5.0)
HCT: 40.7 % (ref 39.0–52.0)
Hemoglobin: 13.8 g/dL (ref 13.0–17.0)
Lymphocytes Relative: 24.7 % (ref 12.0–46.0)
Lymphs Abs: 2.2 10*3/uL (ref 0.7–4.0)
MCHC: 33.9 g/dL (ref 30.0–36.0)
MCV: 85.5 fl (ref 78.0–100.0)
Monocytes Absolute: 0.7 10*3/uL (ref 0.1–1.0)
Monocytes Relative: 8 % (ref 3.0–12.0)
Neutro Abs: 5.7 10*3/uL (ref 1.4–7.7)
Neutrophils Relative %: 64.1 % (ref 43.0–77.0)
Platelets: 237 10*3/uL (ref 150.0–400.0)
RBC: 4.76 Mil/uL (ref 4.22–5.81)
RDW: 13.7 % (ref 11.5–14.6)
WBC: 8.9 10*3/uL (ref 4.5–10.5)

## 2011-09-10 LAB — BASIC METABOLIC PANEL
BUN: 18 mg/dL (ref 6–23)
CO2: 26 mEq/L (ref 19–32)
Calcium: 9.1 mg/dL (ref 8.4–10.5)
Chloride: 105 mEq/L (ref 96–112)
Creatinine, Ser: 0.8 mg/dL (ref 0.4–1.5)
GFR: 136.59 mL/min (ref >60.00)
Glucose, Bld: 118 mg/dL — ABNORMAL HIGH (ref 70–99)
Potassium: 4.1 mEq/L (ref 3.5–5.1)
Sodium: 140 mEq/L (ref 135–145)

## 2011-09-10 LAB — HEMOGLOBIN A1C: Hgb A1c MFr Bld: 6.9 % — ABNORMAL HIGH (ref 4.6–6.5)

## 2011-09-10 LAB — LIPID PANEL
Cholesterol: 134 mg/dL (ref 0–200)
HDL: 35.5 mg/dL — ABNORMAL LOW (ref >39.00)
LDL Cholesterol: 73 mg/dL (ref 0–99)
Total CHOL/HDL Ratio: 4
Triglycerides: 126 mg/dL (ref 0.0–149.0)
VLDL: 25.2 mg/dL (ref 0.0–40.0)

## 2011-09-10 LAB — AST: AST: 24 U/L (ref 0–37)

## 2011-09-10 LAB — ALT: ALT: 34 U/L (ref 0–53)

## 2011-09-12 ENCOUNTER — Other Ambulatory Visit: Payer: Self-pay

## 2011-09-12 MED ORDER — WARFARIN SODIUM 10 MG PO TABS: ORAL_TABLET | ORAL | Status: DC

## 2011-09-15 ENCOUNTER — Encounter: Payer: Self-pay | Admitting: Internal Medicine

## 2011-09-24 ENCOUNTER — Ambulatory Visit (INDEPENDENT_AMBULATORY_CARE_PROVIDER_SITE_OTHER): Payer: 59

## 2011-09-24 DIAGNOSIS — I4891 Unspecified atrial fibrillation: Secondary | ICD-10-CM

## 2011-09-24 LAB — POCT INR: INR: 1.7

## 2011-09-26 ENCOUNTER — Encounter: Payer: 59 | Admitting: Internal Medicine

## 2011-10-08 ENCOUNTER — Ambulatory Visit (INDEPENDENT_AMBULATORY_CARE_PROVIDER_SITE_OTHER): Payer: 59

## 2011-10-08 DIAGNOSIS — I4891 Unspecified atrial fibrillation: Secondary | ICD-10-CM

## 2011-10-08 LAB — POCT INR: INR: 1.9

## 2011-11-03 ENCOUNTER — Ambulatory Visit (INDEPENDENT_AMBULATORY_CARE_PROVIDER_SITE_OTHER): Payer: 59 | Admitting: Cardiology

## 2011-11-03 ENCOUNTER — Encounter: Payer: Self-pay | Admitting: Cardiology

## 2011-11-03 ENCOUNTER — Ambulatory Visit (INDEPENDENT_AMBULATORY_CARE_PROVIDER_SITE_OTHER): Payer: 59

## 2011-11-03 VITALS — BP 120/72 | HR 56 | Ht 70.0 in | Wt 270.0 lb

## 2011-11-03 DIAGNOSIS — I4891 Unspecified atrial fibrillation: Secondary | ICD-10-CM

## 2011-11-03 DIAGNOSIS — I10 Essential (primary) hypertension: Secondary | ICD-10-CM

## 2011-11-03 DIAGNOSIS — E7800 Pure hypercholesterolemia, unspecified: Secondary | ICD-10-CM

## 2011-11-03 DIAGNOSIS — G4730 Sleep apnea, unspecified: Secondary | ICD-10-CM

## 2011-11-03 LAB — POCT INR: INR: 2.6

## 2011-11-03 NOTE — Patient Instructions (Signed)
Your physician wants you to follow-up in: 4 MONTHS WITH DR Fredrik Cove will receive a reminder letter in the mail two months in advance. If you don't receive a letter, please call our office to schedule the follow-up appointment.

## 2011-11-19 NOTE — Progress Notes (Signed)
HPI:  The patient is really doing quite well. He really works very hard. He puts a lot of hours. He's not having any chest pain, any significant shortness of breath, and he does understand kind of issues that he has. His major problem has been some inability to lose weight. He understands the importance of this very much. He is otherwise stable. He's not having a bleeding.  Current Outpatient Prescriptions  Medication Sig Dispense Refill  . amLODipine (NORVASC) 10 MG tablet Take 1 tablet (10 mg total) by mouth daily.  90 tablet  4  . aspirin 81 MG EC tablet Take 81 mg by mouth daily.        . benazepril (LOTENSIN) 20 MG tablet Take 1 tablet (20 mg total) by mouth daily.  90 tablet  3  . digoxin (LANOXIN) 0.125 MG tablet Take 1 tablet (125 mcg total) by mouth daily.  90 tablet  4  . flecainide (TAMBOCOR) 100 MG tablet Take 1 tablet (100 mg total) by mouth 2 (two) times daily.  180 tablet  3  . fluticasone (FLONASE) 50 MCG/ACT nasal spray 2 sprays by Nasal route daily.        . hydrochlorothiazide (HYDRODIURIL) 12.5 MG tablet Take 1 tablet (12.5 mg total) by mouth daily.  90 tablet  3  . metFORMIN (GLUCOPHAGE XR) 500 MG 24 hr tablet Take 2 tablets (1,000 mg total) by mouth daily with breakfast.  180 tablet  0  . metoprolol (TOPROL-XL) 100 MG 24 hr tablet Take 1 tablet (100 mg total) by mouth daily.  90 tablet  3  . Multiple Vitamin (MULTIVITAMIN) tablet Take 1 tablet by mouth daily.        . pravastatin (PRAVACHOL) 40 MG tablet Take 1 tablet (40 mg total) by mouth daily.  90 tablet  4  . VIAGRA 50 MG tablet TAKE ONE TABLET BY MOUTH AS DIRECTED  10 each  3  . warfarin (COUMADIN) 10 MG tablet Take as directed by Anticoagulation clinic  120 tablet  1    No Known Allergies  Past Medical History  Diagnosis Date  . Pure hypercholesterolemia   . Unspecified sleep apnea   . Hypertension   . Type II or unspecified type diabetes mellitus without mention of complication, not stated as uncontrolled     . Paroxysmal atrial fibrillation   . Obesity   . Erectile dysfunction     Past Surgical History  Procedure Date  . Cardiac catheterization   . Finger surgery     left 1st finger    Family History  Problem Relation Age of Onset  . Heart attack Paternal Uncle   . Heart attack Maternal Grandmother   . Heart disease Mother   . Hypertension Mother   . Diabetes Mother     History   Social History  . Marital Status: Married    Spouse Name: N/A    Number of Children: N/A  . Years of Education: N/A   Occupational History  . Not on file.   Social History Main Topics  . Smoking status: Former Games developer  . Smokeless tobacco: Never Used   Comment: quit in 1995  . Alcohol Use: No  . Drug Use: No  . Sexually Active: Not on file   Other Topics Concern  . Not on file   Social History Narrative  . No narrative on file    ROS: Please see the HPI.  All other systems reviewed and negative.  PHYSICAL EXAM:  BP 120/72  Pulse 56  Ht 5\' 10"  (1.778 m)  Wt 270 lb (122.471 kg)  BMI 38.74 kg/m2  General: Well developed, well nourished, in no acute distress. Head:  Normocephalic and atraumatic. Neck: no JVD Lungs: Clear to auscultation and percussion. Heart: Normal S1 and S2.  No murmur, rubs or gallops.  Abdomen:  Normal bowel sounds; soft; non tender; no organomegaly Pulses: Pulses normal in all 4 extremities. Extremities: No clubbing or cyanosis. No edema. Neurologic: Alert and oriented x 3.  EKG:  SB.  Early repolarization  (pt asymptomatic).  No acute changes.    ASSESSMENT AND PLAN:

## 2011-11-30 NOTE — Assessment & Plan Note (Signed)
Well-controlled at the present time. ?

## 2011-11-30 NOTE — Assessment & Plan Note (Signed)
LDL near target with normal transaminases.

## 2011-11-30 NOTE — Assessment & Plan Note (Signed)
Remains on anticoagulation therapy, and no evidence of bleeding.  Tolerates flecainide.

## 2011-11-30 NOTE — Assessment & Plan Note (Signed)
Using his CPAP machine.  

## 2011-12-10 ENCOUNTER — Ambulatory Visit (INDEPENDENT_AMBULATORY_CARE_PROVIDER_SITE_OTHER): Payer: 59 | Admitting: Internal Medicine

## 2011-12-10 ENCOUNTER — Encounter: Payer: Self-pay | Admitting: Internal Medicine

## 2011-12-10 ENCOUNTER — Ambulatory Visit: Payer: Self-pay | Admitting: Cardiovascular Disease

## 2011-12-10 VITALS — BP 118/72 | HR 65 | Temp 98.2°F | Wt 275.0 lb

## 2011-12-10 DIAGNOSIS — N529 Male erectile dysfunction, unspecified: Secondary | ICD-10-CM

## 2011-12-10 DIAGNOSIS — I4891 Unspecified atrial fibrillation: Secondary | ICD-10-CM

## 2011-12-10 DIAGNOSIS — E119 Type 2 diabetes mellitus without complications: Secondary | ICD-10-CM

## 2011-12-10 LAB — POCT INR: INR: 2

## 2011-12-10 MED ORDER — SILDENAFIL CITRATE 100 MG PO TABS: 100.0000 mg | ORAL_TABLET | Freq: Every day | ORAL | Status: DC | PRN

## 2011-12-10 MED ORDER — METFORMIN HCL ER 500 MG PO TB24: 1000.0000 mg | ORAL_TABLET | Freq: Every day | ORAL | Status: DC

## 2011-12-10 NOTE — Patient Instructions (Addendum)
Same Coumadin, recheck in 3 weeks. I'll let the Coumadin clinic know that you check your value  here today

## 2011-12-10 NOTE — Assessment & Plan Note (Addendum)
Continue with Coumadin 1 tablet daily except 1.5 tablets on Wednesdays and Saturdays. Recheck in 3 weeks because inr  is at the low end of the therapeutic level (2.0) Will notify coumadin clinic

## 2011-12-10 NOTE — Progress Notes (Signed)
  Subjective:    Patient ID: Daniel Grant, male    DOB: 1965-12-12, 46 y.o.   MRN: [id number]  HPI Routine office visit, doing well. Requests his INR to be checked today. Needs a refill on Viagra, see assessment and plan.  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 Denies chest pain, shortness of breath. No  nausea, vomiting, diarrhea. Saw recently cardiology, note reviewed, stable.     Objective:   Physical Exam  General -- alert, well-developed Lungs -- normal respiratory effort, no intercostal retractions, no accessory muscle use, and normal breath sounds.   Heart-- normal rate, regular rhythm, no murmur, and no gallop.   Psych-- Cognition and judgment appear intact. Alert and cooperative with normal attention span and concentration.  not anxious appearing and not depressed appearing.       Assessment & Plan:

## 2011-12-10 NOTE — Assessment & Plan Note (Signed)
Well-controlled, check a hemoglobin A1c. We discussed diet and exercise.

## 2011-12-10 NOTE — Assessment & Plan Note (Addendum)
Good tolerance of Viagra 50 mg, request to change prescription for 100 mg due to cost, we agreed he will take half tablet as needed

## 2011-12-11 ENCOUNTER — Encounter: Payer: Self-pay | Admitting: *Deleted

## 2011-12-11 LAB — HEMOGLOBIN A1C: Hgb A1c MFr Bld: 6.7 % — ABNORMAL HIGH (ref 4.6–6.5)

## 2012-01-01 ENCOUNTER — Encounter (INDEPENDENT_AMBULATORY_CARE_PROVIDER_SITE_OTHER): Payer: 59 | Admitting: Pharmacist

## 2012-01-01 DIAGNOSIS — I4891 Unspecified atrial fibrillation: Secondary | ICD-10-CM

## 2012-01-01 LAB — POCT INR: INR: 2.7

## 2012-01-08 LAB — HM DIABETES EYE EXAM

## 2012-02-11 ENCOUNTER — Ambulatory Visit (INDEPENDENT_AMBULATORY_CARE_PROVIDER_SITE_OTHER): Payer: 59

## 2012-02-11 DIAGNOSIS — I4891 Unspecified atrial fibrillation: Secondary | ICD-10-CM

## 2012-02-11 LAB — POCT INR: INR: 3.8

## 2012-03-03 ENCOUNTER — Encounter: Payer: Self-pay | Admitting: Cardiology

## 2012-03-03 ENCOUNTER — Ambulatory Visit (INDEPENDENT_AMBULATORY_CARE_PROVIDER_SITE_OTHER): Payer: 59 | Admitting: Cardiology

## 2012-03-03 ENCOUNTER — Ambulatory Visit (INDEPENDENT_AMBULATORY_CARE_PROVIDER_SITE_OTHER): Payer: 59 | Admitting: Pharmacist

## 2012-03-03 VITALS — BP 122/80 | HR 63 | Resp 18 | Ht 69.0 in | Wt 276.8 lb

## 2012-03-03 DIAGNOSIS — I10 Essential (primary) hypertension: Secondary | ICD-10-CM

## 2012-03-03 DIAGNOSIS — I4891 Unspecified atrial fibrillation: Secondary | ICD-10-CM

## 2012-03-03 DIAGNOSIS — E7800 Pure hypercholesterolemia, unspecified: Secondary | ICD-10-CM

## 2012-03-03 LAB — BASIC METABOLIC PANEL
BUN: 19 mg/dL (ref 6–23)
CO2: 28 mEq/L (ref 19–32)
Calcium: 9.1 mg/dL (ref 8.4–10.5)
Chloride: 103 mEq/L (ref 96–112)
Creatinine, Ser: 0.8 mg/dL (ref 0.4–1.5)
GFR: 136.32 mL/min (ref >60.00)
Glucose, Bld: 104 mg/dL — ABNORMAL HIGH (ref 70–99)
Potassium: 4 mEq/L (ref 3.5–5.1)
Sodium: 138 mEq/L (ref 135–145)

## 2012-03-03 LAB — POCT INR: INR: 5

## 2012-03-03 MED ORDER — METOPROLOL SUCCINATE ER 100 MG PO TB24: 100.0000 mg | ORAL_TABLET | Freq: Every day | ORAL | Status: DC

## 2012-03-03 MED ORDER — DIGOXIN 125 MCG PO TABS: 125.0000 ug | ORAL_TABLET | Freq: Every day | ORAL | Status: DC

## 2012-03-03 MED ORDER — PRAVASTATIN SODIUM 40 MG PO TABS: 40.0000 mg | ORAL_TABLET | Freq: Every day | ORAL | Status: DC

## 2012-03-03 MED ORDER — FLECAINIDE ACETATE 100 MG PO TABS: 100.0000 mg | ORAL_TABLET | Freq: Two times a day (BID) | ORAL | Status: DC

## 2012-03-03 MED ORDER — HYDROCHLOROTHIAZIDE 12.5 MG PO TABS: 12.5000 mg | ORAL_TABLET | Freq: Every day | ORAL | Status: DC

## 2012-03-03 MED ORDER — BENAZEPRIL HCL 20 MG PO TABS: 20.0000 mg | ORAL_TABLET | Freq: Every day | ORAL | Status: DC

## 2012-03-03 MED ORDER — AMLODIPINE BESYLATE 10 MG PO TABS: 10.0000 mg | ORAL_TABLET | Freq: Every day | ORAL | Status: DC

## 2012-03-03 NOTE — Progress Notes (Signed)
HPI:  The patient is doing extremely well. He's not having any major problems. We reviewed his weight today. Patient is on flecainide. He last underwent cardiac catheterization 2001 at which time he had no evidence of coronary artery disease. His plan to see Dr. Drue Novel and follow up for his diabetes. Otherwise he has no  new symptoms.  Current Outpatient Prescriptions  Medication Sig Dispense Refill  . amLODipine (NORVASC) 10 MG tablet Take 1 tablet (10 mg total) by mouth daily.  90 tablet  4  . aspirin 81 MG EC tablet Take 81 mg by mouth daily.        . benazepril (LOTENSIN) 20 MG tablet Take 1 tablet (20 mg total) by mouth daily.  90 tablet  3  . digoxin (LANOXIN) 0.125 MG tablet Take 1 tablet (125 mcg total) by mouth daily.  90 tablet  4  . flecainide (TAMBOCOR) 100 MG tablet Take 1 tablet (100 mg total) by mouth 2 (two) times daily.  180 tablet  3  . fluticasone (FLONASE) 50 MCG/ACT nasal spray 2 sprays by Nasal route daily.        . hydrochlorothiazide (HYDRODIURIL) 12.5 MG tablet Take 1 tablet (12.5 mg total) by mouth daily.  90 tablet  3  . metFORMIN (GLUCOPHAGE XR) 500 MG 24 hr tablet Take 2 tablets (1,000 mg total) by mouth daily with breakfast.  180 tablet  0  . metoprolol (TOPROL-XL) 100 MG 24 hr tablet Take 1 tablet (100 mg total) by mouth daily.  90 tablet  3  . Multiple Vitamin (MULTIVITAMIN) tablet Take 1 tablet by mouth daily.        . pravastatin (PRAVACHOL) 40 MG tablet Take 1 tablet (40 mg total) by mouth daily.  90 tablet  4  . warfarin (COUMADIN) 10 MG tablet Take as directed by Anticoagulation clinic  120 tablet  1    No Known Allergies  Past Medical History  Diagnosis Date  . Pure hypercholesterolemia   . Unspecified sleep apnea   . Hypertension   . Type II or unspecified type diabetes mellitus without mention of complication, not stated as uncontrolled   . Paroxysmal atrial fibrillation   . Obesity   . Erectile dysfunction     Past Surgical History  Procedure  Date  . Cardiac catheterization   . Finger surgery     left 1st finger    Family History  Problem Relation Age of Onset  . Heart attack Paternal Uncle   . Heart attack Maternal Grandmother   . Heart disease Mother   . Hypertension Mother   . Diabetes Mother     History   Social History  . Marital Status: Married    Spouse Name: N/A    Number of Children: N/A  . Years of Education: N/A   Occupational History  . Not on file.   Social History Main Topics  . Smoking status: Former Games developer  . Smokeless tobacco: Never Used   Comment: quit in 1995  . Alcohol Use: No  . Drug Use: No  . Sexually Active: Not on file   Other Topics Concern  . Not on file   Social History Narrative  . No narrative on file    ROS: Please see the HPI.  All other systems reviewed and negative.  PHYSICAL EXAM:  BP 122/80  Pulse 63  Resp 18  Ht 5\' 9"  (1.753 m)  Wt 276 lb 12.8 oz (125.556 kg)  BMI 40.88 kg/m2  SpO2  98%  General: Well developed, well nourished, in no acute distress. Head:  Normocephalic and atraumatic. Neck: no JVD Lungs: Clear to auscultation and percussion. Heart: Normal S1 and S2.  No murmur, rubs or gallops.  Abdomen:  Normal bowel sounds; soft; non tender; no organomegaly Pulses: Pulses normal in all 4 extremities. Extremities: No clubbing or cyanosis. No edema. Neurologic: Alert and oriented x 3.  EKG:  NSR.  WNL.  ASSESSMENT AND PLAN:

## 2012-03-03 NOTE — Patient Instructions (Addendum)
Your physician recommends that you have lab work today: BMP  Your physician recommends that you continue on your current medications as directed. Please refer to the Current Medication list given to you today.  Your physician recommends that you schedule a follow-up appointment in: MARCH 2014   

## 2012-03-17 ENCOUNTER — Ambulatory Visit (INDEPENDENT_AMBULATORY_CARE_PROVIDER_SITE_OTHER): Payer: 59 | Admitting: *Deleted

## 2012-03-17 DIAGNOSIS — I4891 Unspecified atrial fibrillation: Secondary | ICD-10-CM

## 2012-03-17 LAB — POCT INR: INR: 3.6

## 2012-03-20 NOTE — Assessment & Plan Note (Signed)
Near target.  

## 2012-03-20 NOTE — Assessment & Plan Note (Signed)
Nicely controlled.   

## 2012-03-20 NOTE — Assessment & Plan Note (Signed)
Ongoing issue 

## 2012-03-20 NOTE — Assessment & Plan Note (Signed)
Stable on flecainide.  Mechanism likely related to OSA, etc.  Multiple risk factors for thrombembolic complications.  Will favor stress test on next office visit.

## 2012-04-08 NOTE — Progress Notes (Signed)
Daniel Grant--can you look like only you can to see if he has had a sleep study. I could not find. Thx. TS  I have reviewed EPIC, Echart and Centricity and cannot locate a sleep study in the system.  I called the sleep lab at [facility] and they do not have any documentation of the pt having a sleep study. I spoke with the pt and he states that a sleep study was performed in 2011.  The pt does use a CPAP machine every night and this is managed by [facility].  I will make Dr Riley Kill aware of this information.

## 2012-04-14 ENCOUNTER — Ambulatory Visit: Payer: Self-pay | Admitting: Internal Medicine

## 2012-04-14 ENCOUNTER — Encounter: Payer: Self-pay | Admitting: Internal Medicine

## 2012-04-14 ENCOUNTER — Ambulatory Visit (INDEPENDENT_AMBULATORY_CARE_PROVIDER_SITE_OTHER): Payer: 59 | Admitting: Internal Medicine

## 2012-04-14 VITALS — BP 124/74 | HR 64 | Temp 97.8°F | Wt 276.0 lb

## 2012-04-14 DIAGNOSIS — E119 Type 2 diabetes mellitus without complications: Secondary | ICD-10-CM

## 2012-04-14 DIAGNOSIS — Z7901 Long term (current) use of anticoagulants: Secondary | ICD-10-CM

## 2012-04-14 DIAGNOSIS — I4891 Unspecified atrial fibrillation: Secondary | ICD-10-CM

## 2012-04-14 DIAGNOSIS — I10 Essential (primary) hypertension: Secondary | ICD-10-CM

## 2012-04-14 DIAGNOSIS — G4730 Sleep apnea, unspecified: Secondary | ICD-10-CM

## 2012-04-14 LAB — POCT INR: INR: 3.9

## 2012-04-14 NOTE — Assessment & Plan Note (Signed)
Patient reports a sleep study in 2011, on a CPAP managed by [facility]. No report found in the chart

## 2012-04-14 NOTE — Assessment & Plan Note (Signed)
Coumadin check today, sent to cardiology.

## 2012-04-14 NOTE — Assessment & Plan Note (Addendum)
Well-controlled, no change. Has some lower extremity edema, may be related to CCBs. As long as edema is not getting worse, we'll not change therapy

## 2012-04-14 NOTE — Progress Notes (Signed)
  Subjective:    Patient ID: Daniel Grant, male    DOB: Mar 13, 1966, 46 y.o.   MRN: [id number]  HPI Routine office visit Diabetes, good medication compliance, exercising more. CBGs checked about once a week, usually around 120. A-fibrillation, good medication compliance, on Coumadin. INR checked today. Hypertension, good medication compliance, last BMP normal, no ambulatory blood pressures. High cholesterol, good medication compliance.  Past Medical History  Diagnosis Date  . Pure hypercholesterolemia   . Unspecified sleep apnea   . Hypertension   . Type II or unspecified type diabetes mellitus without mention of complication, not stated as uncontrolled   . Paroxysmal atrial fibrillation   . Obesity   . Erectile dysfunction    Past Surgical History  Procedure Date  . Cardiac catheterization   . 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 Some pretibial edema, worse at the end of the day. Denies any orthostatic dizziness or lack of energy No nausea, vomiting or diarrhea    Objective:   Physical Exam General -- alert, well-developed, + central obesity. No apparent distress.  Lungs -- normal respiratory effort, no intercostal retractions, no accessory muscle use, and normal breath sounds.   Heart-- normal rate, regular rhythm, no murmur, and no gallop.   Extremities-- trace pretibial edema bilaterally  Neurologic-- alert & oriented X3 and strength normal in all extremities. Psych-- Cognition and judgment appear intact. Alert and cooperative with normal attention span  and concentration.  not anxious appearing and not depressed appearing.       Assessment & Plan:

## 2012-04-14 NOTE — Assessment & Plan Note (Addendum)
Pt is trying to exercise more, good compliance with metformin, check labs. if diabetes controlled, next visit is in 5 months

## 2012-04-15 LAB — ALT: ALT: 32 U/L (ref 0–53)

## 2012-04-15 LAB — AST: AST: 23 U/L (ref 0–37)

## 2012-04-15 LAB — TSH: TSH: 0.54 u[IU]/mL (ref 0.35–5.50)

## 2012-04-15 LAB — HEMOGLOBIN A1C: Hgb A1c MFr Bld: 6.6 % — ABNORMAL HIGH (ref 4.6–6.5)

## 2012-04-17 ENCOUNTER — Other Ambulatory Visit: Payer: Self-pay | Admitting: Internal Medicine

## 2012-04-19 ENCOUNTER — Encounter: Payer: Self-pay | Admitting: *Deleted

## 2012-04-19 NOTE — Telephone Encounter (Signed)
Refill done.  

## 2012-04-30 ENCOUNTER — Other Ambulatory Visit: Payer: Self-pay | Admitting: *Deleted

## 2012-04-30 MED ORDER — WARFARIN SODIUM 10 MG PO TABS: ORAL_TABLET | ORAL | Status: DC

## 2012-05-03 ENCOUNTER — Ambulatory Visit (INDEPENDENT_AMBULATORY_CARE_PROVIDER_SITE_OTHER): Payer: 59

## 2012-05-03 DIAGNOSIS — I4891 Unspecified atrial fibrillation: Secondary | ICD-10-CM

## 2012-05-03 LAB — POCT INR: INR: 4.4

## 2012-05-17 ENCOUNTER — Ambulatory Visit (INDEPENDENT_AMBULATORY_CARE_PROVIDER_SITE_OTHER): Payer: 59

## 2012-05-17 DIAGNOSIS — I4891 Unspecified atrial fibrillation: Secondary | ICD-10-CM

## 2012-05-17 LAB — POCT INR: INR: 3.7

## 2012-05-28 ENCOUNTER — Ambulatory Visit (INDEPENDENT_AMBULATORY_CARE_PROVIDER_SITE_OTHER): Payer: 59 | Admitting: Internal Medicine

## 2012-05-28 VITALS — BP 126/78 | HR 65 | Temp 98.0°F | Wt 278.0 lb

## 2012-05-28 DIAGNOSIS — M79609 Pain in unspecified limb: Secondary | ICD-10-CM

## 2012-05-28 DIAGNOSIS — M79603 Pain in arm, unspecified: Secondary | ICD-10-CM

## 2012-05-28 DIAGNOSIS — M25519 Pain in unspecified shoulder: Secondary | ICD-10-CM

## 2012-05-28 DIAGNOSIS — L723 Sebaceous cyst: Secondary | ICD-10-CM

## 2012-05-28 NOTE — Progress Notes (Signed)
  Subjective:    Patient ID: Daniel Grant, male    DOB: 1966/05/08, 47 y.o.   MRN: [id number]  HPI Approximately 2 months history of on and off pain in the left shoulder, mostly affected area is the deltoid, pain is quite noticeable whenever he gets up in his track and he pulls himself by the left arm. Occasionally pain at night particularly if he turns over the left side. Taking Tylenol. Also, he has a cyst at the left upper back and thinks the area is getting bigger.  Past Medical History  Diagnosis Date  . Hyperlipidemia   . OSA (obstructive sleep apnea)   . Hypertension   . DM II (diabetes mellitus, type II), controlled   . Paroxysmal atrial fibrillation   . Obesity   . Erectile dysfunction    Past Surgical History  Procedure Date  . Cardiac catheterization   . Finger surgery     left 1st finger    Review of Systems Denies associated shortness of breath, nausea or diaphoresis when he has shoulder pain. No substernal chest pain No upper extremity paresthesias No neck pain per se.     Objective:   Physical Exam  Musculoskeletal:       Arms:   General -- alert, well-developed,NAD.    Lungs -- normal respiratory effort, no intercostal retractions, no accessory muscle use, and normal breath sounds.   Heart-- normal rate, regular rhythm, no murmur, and no gallop.   Extremities-- no pretibial edema bilaterally; Shoulders symmetric, and no deformity, right shoulder full range of motion. Left shoulder full range of motion, some pain elicited with arm elevation and shoulder rotation. Neurologic-- alert & oriented X3;  DTRs and motor strength symmetric throughout. Alert and cooperative with normal attention span and concentration.  not anxious appearing and not depressed appearing.        Assessment & Plan:

## 2012-05-28 NOTE — Patient Instructions (Addendum)
Continue taking Tylenol as needed for pain Will refer you to the orthopedic doctor If your pain change in any   way or you have chest pain, let me know.

## 2012-05-30 ENCOUNTER — Encounter: Payer: Self-pay | Admitting: Internal Medicine

## 2012-05-30 DIAGNOSIS — M25519 Pain in unspecified shoulder: Secondary | ICD-10-CM | POA: Insufficient documentation

## 2012-05-30 DIAGNOSIS — L723 Sebaceous cyst: Secondary | ICD-10-CM | POA: Insufficient documentation

## 2012-05-30 NOTE — Assessment & Plan Note (Signed)
47 year old gentleman with multiple cardiovascular risk factors who presents with shoulder pain, EKG today without acute changes and not different from previous. Plan: Continue with Tylenol Refer to ortho, needs evening appointments

## 2012-05-30 NOTE — Assessment & Plan Note (Signed)
Increase in size? Exam seems to be stable, will reassess and return to the office once the shoulder pain is resolved.

## 2012-06-14 ENCOUNTER — Ambulatory Visit (INDEPENDENT_AMBULATORY_CARE_PROVIDER_SITE_OTHER): Payer: 59 | Admitting: Pharmacist

## 2012-06-14 DIAGNOSIS — I4891 Unspecified atrial fibrillation: Secondary | ICD-10-CM

## 2012-06-14 LAB — POCT INR: INR: 3.4

## 2012-07-05 ENCOUNTER — Ambulatory Visit (INDEPENDENT_AMBULATORY_CARE_PROVIDER_SITE_OTHER): Payer: 59 | Admitting: *Deleted

## 2012-07-05 DIAGNOSIS — I4891 Unspecified atrial fibrillation: Secondary | ICD-10-CM

## 2012-07-05 LAB — POCT INR: INR: 5.2

## 2012-07-12 ENCOUNTER — Ambulatory Visit (INDEPENDENT_AMBULATORY_CARE_PROVIDER_SITE_OTHER): Payer: 59 | Admitting: *Deleted

## 2012-07-12 DIAGNOSIS — I4891 Unspecified atrial fibrillation: Secondary | ICD-10-CM

## 2012-07-12 LAB — POCT INR: INR: 2.4

## 2012-07-23 ENCOUNTER — Telehealth: Payer: Self-pay | Admitting: *Deleted

## 2012-07-23 NOTE — Telephone Encounter (Signed)
Pt wants to R/S his appt from 3/3 to 3/11 with Dr Riley Kill explained to pt that we wanted to see him on 3/3 due to his liable INRs for safety but he states he wanted them the same day, thus appt R/S to 08/03/12.

## 2012-08-03 ENCOUNTER — Ambulatory Visit: Payer: 59 | Admitting: Internal Medicine

## 2012-08-03 ENCOUNTER — Ambulatory Visit (INDEPENDENT_AMBULATORY_CARE_PROVIDER_SITE_OTHER): Payer: 59 | Admitting: Cardiology

## 2012-08-03 ENCOUNTER — Encounter: Payer: Self-pay | Admitting: Cardiology

## 2012-08-03 ENCOUNTER — Ambulatory Visit (INDEPENDENT_AMBULATORY_CARE_PROVIDER_SITE_OTHER): Payer: 59 | Admitting: *Deleted

## 2012-08-03 VITALS — BP 108/70 | HR 61 | Ht 70.0 in | Wt 281.0 lb

## 2012-08-03 DIAGNOSIS — I10 Essential (primary) hypertension: Secondary | ICD-10-CM

## 2012-08-03 DIAGNOSIS — G4730 Sleep apnea, unspecified: Secondary | ICD-10-CM

## 2012-08-03 DIAGNOSIS — I4891 Unspecified atrial fibrillation: Secondary | ICD-10-CM

## 2012-08-03 DIAGNOSIS — E7800 Pure hypercholesterolemia, unspecified: Secondary | ICD-10-CM

## 2012-08-03 DIAGNOSIS — Z5181 Encounter for therapeutic drug level monitoring: Secondary | ICD-10-CM

## 2012-08-03 DIAGNOSIS — Z79899 Other long term (current) drug therapy: Secondary | ICD-10-CM

## 2012-08-03 LAB — POCT INR: INR: 3.1

## 2012-08-03 MED ORDER — AMLODIPINE BESYLATE 10 MG PO TABS: 10.0000 mg | ORAL_TABLET | Freq: Every day | ORAL | Status: DC

## 2012-08-03 MED ORDER — METOPROLOL SUCCINATE ER 100 MG PO TB24
100.0000 mg | ORAL_TABLET | Freq: Every day | ORAL | Status: DC
Start: 2012-08-03 — End: 2013-01-10

## 2012-08-03 MED ORDER — DIGOXIN 125 MCG PO TABS: 125.0000 ug | ORAL_TABLET | Freq: Every day | ORAL | Status: DC

## 2012-08-03 MED ORDER — FLECAINIDE ACETATE 100 MG PO TABS: 100.0000 mg | ORAL_TABLET | Freq: Two times a day (BID) | ORAL | Status: DC

## 2012-08-03 MED ORDER — HYDROCHLOROTHIAZIDE 12.5 MG PO TABS: 12.5000 mg | ORAL_TABLET | Freq: Every day | ORAL | Status: DC

## 2012-08-03 MED ORDER — PRAVASTATIN SODIUM 40 MG PO TABS: 40.0000 mg | ORAL_TABLET | Freq: Every day | ORAL | Status: DC

## 2012-08-03 MED ORDER — WARFARIN SODIUM 10 MG PO TABS: ORAL_TABLET | ORAL | Status: DC

## 2012-08-03 MED ORDER — BENAZEPRIL HCL 20 MG PO TABS: 20.0000 mg | ORAL_TABLET | Freq: Every day | ORAL | Status: DC

## 2012-08-03 NOTE — Patient Instructions (Addendum)
**Note De-Identified Matther Labell Obfuscation** Your physician recommends that you return for lab work in: Thursday of this week  Your physician wants you to follow-up in: 4 months. You will receive a reminder letter in the mail two months in advance. If you don't receive a letter, please call our office to schedule the follow-up appointment.

## 2012-08-04 NOTE — Progress Notes (Signed)
HPI:  He is doing well.  Uses his CPAP every night.  No major complaints today. Continues to get along well.  No new issues.    Current Outpatient Prescriptions  Medication Sig Dispense Refill  . amLODipine (NORVASC) 10 MG tablet Take 1 tablet (10 mg total) by mouth daily.  90 tablet  1  . aspirin 81 MG EC tablet Take 81 mg by mouth daily.        . benazepril (LOTENSIN) 20 MG tablet Take 1 tablet (20 mg total) by mouth daily.  90 tablet  1  . digoxin (LANOXIN) 0.125 MG tablet Take 1 tablet (125 mcg total) by mouth daily.  90 tablet  1  . flecainide (TAMBOCOR) 100 MG tablet Take 1 tablet (100 mg total) by mouth 2 (two) times daily.  180 tablet  1  . fluticasone (FLONASE) 50 MCG/ACT nasal spray 2 sprays by Nasal route daily.        . hydrochlorothiazide (HYDRODIURIL) 12.5 MG tablet Take 1 tablet (12.5 mg total) by mouth daily.  90 tablet  1  . metFORMIN (GLUCOPHAGE-XR) 500 MG 24 hr tablet TAKE TWO TABLETS BY MOUTH EVERY DAY WITH BREAKFAST  180 tablet  1  . metoprolol succinate (TOPROL-XL) 100 MG 24 hr tablet Take 1 tablet (100 mg total) by mouth daily.  90 tablet  1  . Multiple Vitamin (MULTIVITAMIN) tablet Take 1 tablet by mouth daily.        . pravastatin (PRAVACHOL) 40 MG tablet Take 1 tablet (40 mg total) by mouth daily.  90 tablet  1  . warfarin (COUMADIN) 10 MG tablet Take as directed by Anticoagulation clinic  120 tablet  0   No current facility-administered medications for this visit.    No Known Allergies  Past Medical History  Diagnosis Date  . Hyperlipidemia   . OSA (obstructive sleep apnea)   . Hypertension   . DM II (diabetes mellitus, type II), controlled   . Paroxysmal atrial fibrillation   . Obesity   . Erectile dysfunction     Past Surgical History  Procedure Laterality Date  . Cardiac catheterization    . Finger surgery      left 1st finger    Family History  Problem Relation Age of Onset  . Heart attack Paternal Uncle   . Heart attack Maternal  Grandmother   . Heart disease Mother   . Hypertension Mother   . Diabetes Mother     History   Social History  . Marital Status: Married    Spouse Name: N/A    Number of Children: N/A  . Years of Education: N/A   Occupational History  . Not on file.   Social History Main Topics  . Smoking status: Former Games developer  . Smokeless tobacco: Never Used     Comment: quit in 1995  . Alcohol Use: No  . Drug Use: No  . Sexually Active: Not on file   Other Topics Concern  . Not on file   Social History Narrative  . No narrative on file    ROS: Please see the HPI.  All other systems reviewed and negative.  PHYSICAL EXAM:  BP 108/70  Pulse 61  Ht 5\' 10"  (1.778 m)  Wt 281 lb (127.461 kg)  BMI 40.32 kg/m2  SpO2 96%  General: Well developed, well nourished, in no acute distress. Head:  Normocephalic and atraumatic. Neck: no JVD Lungs: Clear to auscultation and percussion. Heart: Normal S1 and S2.  No  murmur, rubs or gallops.  Pulses: Pulses normal in all 4 extremities. Extremities: No clubbing or cyanosis. No edema. Neurologic: Alert and oriented x 3.  EKG:  NSR.  Nonspecific ST abnormality.  QTc .   ASSESSMENT AND PLAN:

## 2012-08-05 ENCOUNTER — Other Ambulatory Visit (INDEPENDENT_AMBULATORY_CARE_PROVIDER_SITE_OTHER): Payer: 59

## 2012-08-05 ENCOUNTER — Ambulatory Visit: Payer: Self-pay | Admitting: Emergency Medicine

## 2012-08-05 VITALS — BP 138/90 | HR 67 | Temp 97.6°F | Resp 18 | Ht 69.0 in | Wt 275.0 lb

## 2012-08-05 DIAGNOSIS — E7800 Pure hypercholesterolemia, unspecified: Secondary | ICD-10-CM

## 2012-08-05 DIAGNOSIS — Z5181 Encounter for therapeutic drug level monitoring: Secondary | ICD-10-CM

## 2012-08-05 DIAGNOSIS — Z0289 Encounter for other administrative examinations: Secondary | ICD-10-CM

## 2012-08-05 DIAGNOSIS — Z79899 Other long term (current) drug therapy: Secondary | ICD-10-CM

## 2012-08-05 LAB — LIPID PANEL
Cholesterol: 132 mg/dL (ref 0–200)
HDL: 28.9 mg/dL — ABNORMAL LOW (ref >39.00)
LDL Cholesterol: 69 mg/dL (ref 0–99)
Total CHOL/HDL Ratio: 5
Triglycerides: 169 mg/dL — ABNORMAL HIGH (ref 0.0–149.0)
VLDL: 33.8 mg/dL (ref 0.0–40.0)

## 2012-08-05 LAB — BASIC METABOLIC PANEL
BUN: 17 mg/dL (ref 6–23)
CO2: 24 mEq/L (ref 19–32)
Calcium: 9.1 mg/dL (ref 8.4–10.5)
Chloride: 103 mEq/L (ref 96–112)
Creatinine, Ser: 0.8 mg/dL (ref 0.4–1.5)
GFR: 134.09 mL/min (ref >60.00)
Glucose, Bld: 142 mg/dL — ABNORMAL HIGH (ref 70–99)
Potassium: 4.1 mEq/L (ref 3.5–5.1)
Sodium: 137 mEq/L (ref 135–145)

## 2012-08-05 LAB — HEPATIC FUNCTION PANEL
ALT: 30 U/L (ref 0–53)
AST: 17 U/L (ref 0–37)
Albumin: 3.9 g/dL (ref 3.5–5.2)
Alkaline Phosphatase: 86 U/L (ref 39–117)
Bilirubin, Direct: 0.1 mg/dL (ref 0.0–0.3)
Total Bilirubin: 0.9 mg/dL (ref 0.3–1.2)
Total Protein: 7.1 g/dL (ref 6.0–8.3)

## 2012-08-05 NOTE — Progress Notes (Signed)
[facility] [address] [place] [address] [phone]  Date:  08/05/2012   Name:  Daniel Grant   DOB:  09-Nov-1965   MRN:  [id number]  PCP:  Willow Ora, MD    Chief Complaint: DOT physical   History of Present Illness:  Daniel Grant is a 47 y.o. very pleasant male patient who presents with the following:  DOT certification  Patient Active Problem List  Diagnosis  . AODM  . HYPERCHOLESTEROLEMIA  . OBESITY, MORBID  . HYPERTENSION, MODERATE  . ATRIAL FIBRILLATION, PAROXYSMAL, CHRONIC  . SLEEP APNEA  . HEMATURIA, HX OF  . General medical examination  . Erectile dysfunction  . Shoulder pain  . Sebaceous cyst    Past Medical History  Diagnosis Date  . Hyperlipidemia   . OSA (obstructive sleep apnea)   . Hypertension   . DM II (diabetes mellitus, type II), controlled   . Paroxysmal atrial fibrillation   . Obesity   . Erectile dysfunction     Past Surgical History  Procedure Laterality Date  . Cardiac catheterization    . Finger surgery      left 1st finger    History  Substance Use Topics  . Smoking status: Former Smoker    Quit date: 05/26/1994  . Smokeless tobacco: Never Used     Comment: quit in 1995  . Alcohol Use: No    Family History  Problem Relation Age of Onset  . Heart attack Paternal Uncle   . Heart attack Maternal Grandmother   . Heart disease Mother   . Hypertension Mother   . Diabetes Mother     No Known Allergies  Medication list has been reviewed and updated.  Current Outpatient Prescriptions on File Prior to Visit  Medication Sig Dispense Refill  . amLODipine (NORVASC) 10 MG tablet Take 1 tablet (10 mg total) by mouth daily.  90 tablet  1  . aspirin 81 MG EC tablet Take 81 mg by mouth daily.        . benazepril (LOTENSIN) 20 MG tablet Take 1 tablet (20 mg total) by mouth daily.  90 tablet  1  . flecainide (TAMBOCOR) 100 MG tablet Take 1 tablet (100 mg total) by mouth 2 (two) times daily.  180 tablet   1  . fluticasone (FLONASE) 50 MCG/ACT nasal spray 2 sprays by Nasal route daily.        . hydrochlorothiazide (HYDRODIURIL) 12.5 MG tablet Take 1 tablet (12.5 mg total) by mouth daily.  90 tablet  1  . metFORMIN (GLUCOPHAGE-XR) 500 MG 24 hr tablet TAKE TWO TABLETS BY MOUTH EVERY DAY WITH BREAKFAST  180 tablet  1  . metoprolol succinate (TOPROL-XL) 100 MG 24 hr tablet Take 1 tablet (100 mg total) by mouth daily.  90 tablet  1  . Multiple Vitamin (MULTIVITAMIN) tablet Take 1 tablet by mouth daily.        . pravastatin (PRAVACHOL) 40 MG tablet Take 1 tablet (40 mg total) by mouth daily.  90 tablet  1  . warfarin (COUMADIN) 10 MG tablet Take as directed by Anticoagulation clinic  120 tablet  0  . digoxin (LANOXIN) 0.125 MG tablet Take 1 tablet (125 mcg total) by mouth daily.  90 tablet  1   No current facility-administered medications on file prior to visit.    Review of Systems:  As per HPI, otherwise negative.    Physical Examination: Filed Vitals:   08/05/12 1603  BP:  138/90  Pulse: 67  Temp: 97.6 F (36.4 C)  Resp: 18   Filed Vitals:   08/05/12 1603  Height: 5\' 9"  (1.753 m)  Weight: 275 lb (124.739 kg)   Body mass index is 40.59 kg/(m^2). Ideal Body Weight: Weight in (lb) to have BMI = 25: 168.9  GEN: WDWN, NAD, Non-toxic, A & O x 3 HEENT: Atraumatic, Normocephalic. Neck supple. No masses, No LAD. Ears and Nose: No external deformity. CV: RRR, No M/G/R. No JVD. No thrill. No extra heart sounds. PULM: CTA B, no wheezes, crackles, rhonchi. No retractions. No resp. distress. No accessory muscle use. ABD: S, NT, ND, +BS. No rebound. No HSM. EXTR: No c/c/e NEURO Normal gait.  PSYCH: Normally interactive. Conversant. Not depressed or anxious appearing.  Calm demeanor.    Assessment and Plan: DOT Hypertension under control with no adverse effects of treatment Obstructive sleep apnea with documentation of daily wear  Carmelina Dane, MD

## 2012-08-06 ENCOUNTER — Ambulatory Visit: Payer: 59 | Admitting: Internal Medicine

## 2012-08-07 NOTE — Assessment & Plan Note (Signed)
Wears his CPAP regularly

## 2012-08-07 NOTE — Assessment & Plan Note (Signed)
Currently controlled.

## 2012-08-07 NOTE — Assessment & Plan Note (Signed)
Has history of atrial fib with multiple risk factors.  Remains on Flecainide.  Continue to monitor.

## 2012-08-11 LAB — FLECAINIDE LEVEL: Flecainide: 0.23 ug/mL (ref 0.20–1.00)

## 2012-08-12 ENCOUNTER — Encounter: Payer: Self-pay | Admitting: Emergency Medicine

## 2012-08-12 NOTE — Progress Notes (Signed)
LM both numbers

## 2012-09-01 ENCOUNTER — Ambulatory Visit (INDEPENDENT_AMBULATORY_CARE_PROVIDER_SITE_OTHER): Payer: 59 | Admitting: *Deleted

## 2012-09-01 DIAGNOSIS — I4891 Unspecified atrial fibrillation: Secondary | ICD-10-CM

## 2012-09-01 LAB — POCT INR: INR: 1.9

## 2012-09-03 ENCOUNTER — Encounter: Payer: 59 | Admitting: Internal Medicine

## 2012-09-15 ENCOUNTER — Other Ambulatory Visit (INDEPENDENT_AMBULATORY_CARE_PROVIDER_SITE_OTHER): Payer: 59

## 2012-09-15 ENCOUNTER — Encounter: Payer: Self-pay | Admitting: Internal Medicine

## 2012-09-15 ENCOUNTER — Telehealth: Payer: Self-pay | Admitting: Internal Medicine

## 2012-09-15 ENCOUNTER — Ambulatory Visit (INDEPENDENT_AMBULATORY_CARE_PROVIDER_SITE_OTHER): Payer: 59 | Admitting: Internal Medicine

## 2012-09-15 VITALS — BP 120/78 | HR 58 | Temp 97.6°F | Resp 16 | Ht 70.0 in | Wt 276.5 lb

## 2012-09-15 DIAGNOSIS — Z0000 Encounter for general adult medical examination without abnormal findings: Secondary | ICD-10-CM

## 2012-09-15 DIAGNOSIS — E7800 Pure hypercholesterolemia, unspecified: Secondary | ICD-10-CM

## 2012-09-15 DIAGNOSIS — I10 Essential (primary) hypertension: Secondary | ICD-10-CM

## 2012-09-15 DIAGNOSIS — IMO0001 Reserved for inherently not codable concepts without codable children: Secondary | ICD-10-CM

## 2012-09-15 DIAGNOSIS — Z23 Encounter for immunization: Secondary | ICD-10-CM

## 2012-09-15 DIAGNOSIS — I4891 Unspecified atrial fibrillation: Secondary | ICD-10-CM

## 2012-09-15 LAB — URINALYSIS, ROUTINE W REFLEX MICROSCOPIC
Bilirubin Urine: NEGATIVE
Ketones, ur: NEGATIVE
Leukocytes, UA: NEGATIVE
Nitrite: NEGATIVE
Specific Gravity, Urine: 1.025 (ref 1.000–1.030)
Total Protein, Urine: NEGATIVE
Urine Glucose: NEGATIVE
Urobilinogen, UA: 1 (ref 0.0–1.0)
pH: 6 (ref 5.0–8.0)

## 2012-09-15 LAB — COMPREHENSIVE METABOLIC PANEL
ALT: 29 U/L (ref 0–53)
AST: 20 U/L (ref 0–37)
Albumin: 3.9 g/dL (ref 3.5–5.2)
Alkaline Phosphatase: 96 U/L (ref 39–117)
BUN: 19 mg/dL (ref 6–23)
CO2: 26 mEq/L (ref 19–32)
Calcium: 9 mg/dL (ref 8.4–10.5)
Chloride: 104 mEq/L (ref 96–112)
Creatinine, Ser: 0.8 mg/dL (ref 0.4–1.5)
GFR: 132.1 mL/min (ref >60.00)
Glucose, Bld: 136 mg/dL — ABNORMAL HIGH (ref 70–99)
Potassium: 3.9 mEq/L (ref 3.5–5.1)
Sodium: 137 mEq/L (ref 135–145)
Total Bilirubin: 0.8 mg/dL (ref 0.3–1.2)
Total Protein: 6.8 g/dL (ref 6.0–8.3)

## 2012-09-15 LAB — MICROALBUMIN / CREATININE URINE RATIO
Creatinine,U: 116 mg/dL
Microalb Creat Ratio: 0.9 mg/g (ref 0.0–30.0)
Microalb, Ur: 1 mg/dL (ref 0.0–1.9)

## 2012-09-15 LAB — HEMOGLOBIN A1C: Hgb A1c MFr Bld: 7.1 % — ABNORMAL HIGH (ref 4.6–6.5)

## 2012-09-15 LAB — FECAL OCCULT BLOOD, GUAIAC: Fecal Occult Blood: NEGATIVE

## 2012-09-15 LAB — PSA: PSA: 0.37 ng/mL (ref 0.10–4.00)

## 2012-09-15 LAB — HM DIABETES FOOT EXAM

## 2012-09-15 LAB — TSH: TSH: 0.39 u[IU]/mL (ref 0.35–5.50)

## 2012-09-15 MED ORDER — METFORMIN HCL ER 500 MG PO TB24: ORAL_TABLET | ORAL | Status: DC

## 2012-09-15 NOTE — Progress Notes (Signed)
Subjective:    Patient ID: Daniel Grant, male    DOB: 1966/01/14, 47 y.o.   MRN: [id number]  Diabetes He presents for his follow-up diabetic visit. He has type 2 diabetes mellitus. There are no hypoglycemic associated symptoms. Pertinent negatives for hypoglycemia include no dizziness, headaches, pallor or tremors. There are no diabetic associated symptoms. Pertinent negatives for diabetes include no blurred vision, no chest pain, no fatigue, no foot paresthesias, no foot ulcerations, no polydipsia, no polyphagia, no polyuria, no visual change, no weakness and no weight loss. There are no hypoglycemic complications. Symptoms are stable. There are no diabetic complications. Current diabetic treatment includes oral agent (monotherapy). He is compliant with treatment most of the time. His weight is stable. He is following a generally unhealthy diet. When asked about meal planning, he reported none. He has not had a previous visit with a dietician. He never participates in exercise. There is no change in his home blood glucose trend. An ACE inhibitor/angiotensin II receptor blocker is being taken. He does not see a podiatrist.Eye exam is current.      Review of Systems  Constitutional: Negative.  Negative for fever, chills, weight loss, diaphoresis, activity change, appetite change, fatigue and unexpected weight change.  HENT: Negative.   Eyes: Negative.  Negative for blurred vision.  Respiratory: Positive for apnea. Negative for cough, choking, chest tightness, shortness of breath, wheezing and stridor.   Cardiovascular: Negative.  Negative for chest pain, palpitations and leg swelling.  Gastrointestinal: Negative.  Negative for nausea, vomiting, abdominal pain, diarrhea and constipation.  Endocrine: Negative.  Negative for polydipsia, polyphagia and polyuria.  Genitourinary: Negative.  Negative for dysuria, urgency, frequency, hematuria, flank pain, decreased urine volume and difficulty urinating.   Musculoskeletal: Negative.  Negative for back pain, joint swelling, arthralgias and gait problem.  Skin: Negative.  Negative for color change, pallor, rash and wound.  Allergic/Immunologic: Negative.   Neurological: Negative.  Negative for dizziness, tremors, weakness, light-headedness, numbness and headaches.  Hematological: Negative.  Negative for adenopathy. Does not bruise/bleed easily.  Psychiatric/Behavioral: Negative.        Objective:   Physical Exam  Vitals reviewed. Constitutional: He is oriented to person, place, and time. He appears well-developed and well-nourished. No distress.  HENT:  Head: Normocephalic and atraumatic.  Mouth/Throat: Oropharynx is clear and moist. No oropharyngeal exudate.  Eyes: Conjunctivae are normal. Right eye exhibits no discharge. Left eye exhibits no discharge. No scleral icterus.  Neck: Normal range of motion. Neck supple. No JVD present. No tracheal deviation present. No thyromegaly present.  Cardiovascular: Normal rate, regular rhythm, normal heart sounds and intact distal pulses.  Exam reveals no gallop and no friction rub.   No murmur heard. Pulmonary/Chest: Effort normal and breath sounds normal. No stridor. No respiratory distress. He has no wheezes. He has no rales. He exhibits no tenderness.  Abdominal: Soft. Bowel sounds are normal. He exhibits no distension and no mass. There is no tenderness. There is no rebound and no guarding. Hernia confirmed negative in the right inguinal area and confirmed negative in the left inguinal area.  Genitourinary: Testes normal and penis normal. Rectal exam shows external hemorrhoid and internal hemorrhoid. Rectal exam shows no fissure, no mass, no tenderness and anal tone normal. Guaiac negative stool. Prostate is enlarged (1+ smooth symm BPH). Prostate is not tender. Right testis shows no mass, no swelling and no tenderness. Right testis is descended. Left testis shows no mass, no swelling and no tenderness.  Left testis is descended.  Circumcised. No penile erythema or penile tenderness. No discharge found.  Musculoskeletal: Normal range of motion. He exhibits no edema and no tenderness.  Lymphadenopathy:    He has no cervical adenopathy.       Right: No inguinal adenopathy present.       Left: No inguinal adenopathy present.  Neurological: He is oriented to person, place, and time.  Skin: Skin is warm and dry. No rash noted. He is not diaphoretic. No erythema. No pallor.  Psychiatric: He has a normal mood and affect. His behavior is normal. Judgment and thought content normal.     Lab Results  Component Value Date   WBC 8.9 09/10/2011   HGB 13.8 09/10/2011   HCT 40.7 09/10/2011   PLT 237.0 09/10/2011   GLUCOSE 142* 08/05/2012   CHOL 132 08/05/2012   TRIG 169.0* 08/05/2012   HDL 28.90* 08/05/2012   LDLCALC 69 08/05/2012   ALT 30 08/05/2012   AST 17 08/05/2012   NA 137 08/05/2012   K 4.1 08/05/2012   CL 103 08/05/2012   CREATININE 0.8 08/05/2012   BUN 17 08/05/2012   CO2 24 08/05/2012   TSH 0.54 04/14/2012   INR 1.9 09/01/2012   HGBA1C 6.6* 04/14/2012       Assessment & Plan:

## 2012-09-15 NOTE — Patient Instructions (Signed)
Health Maintenance, Males A healthy lifestyle and preventative care can promote health and wellness.  Maintain regular health, dental, and eye exams.  Eat a healthy diet. Foods like vegetables, fruits, whole grains, low-fat dairy products, and lean protein foods contain the nutrients you need without too many calories. Decrease your intake of foods high in solid fats, added sugars, and salt. Get information about a proper diet from your caregiver, if necessary.  Regular physical exercise is one of the most important things you can do for your health. Most adults should get at least 150 minutes of moderate-intensity exercise (any activity that increases your heart rate and causes you to sweat) each week. In addition, most adults need muscle-strengthening exercises on 2 or more days a week.   Maintain a healthy weight. The body mass index (BMI) is a screening tool to identify possible weight problems. It provides an estimate of body fat based on height and weight. Your caregiver can help determine your BMI, and can help you achieve or maintain a healthy weight. For adults 20 years and older:  A BMI below 18.5 is considered underweight.  A BMI of 18.5 to 24.9 is normal.  A BMI of 25 to 29.9 is considered overweight.  A BMI of 30 and above is considered obese.  Maintain normal blood lipids and cholesterol by exercising and minimizing your intake of saturated fat. Eat a balanced diet with plenty of fruits and vegetables. Blood tests for lipids and cholesterol should begin at age 20 and be repeated every 5 years. If your lipid or cholesterol levels are high, you are over 50, or you are a high risk for heart disease, you may need your cholesterol levels checked more frequently.Ongoing high lipid and cholesterol levels should be treated with medicines, if diet and exercise are not effective.  If you smoke, find out from your caregiver how to quit. If you do not use tobacco, do not start.  If you  choose to drink alcohol, do not exceed 2 drinks per day. One drink is considered to be 12 ounces (355 mL) of beer, 5 ounces (148 mL) of wine, or 1.5 ounces (44 mL) of liquor.  Avoid use of street drugs. Do not share needles with anyone. Ask for help if you need support or instructions about stopping the use of drugs.  High blood pressure causes heart disease and increases the risk of stroke. Blood pressure should be checked at least every 1 to 2 years. Ongoing high blood pressure should be treated with medicines if weight loss and exercise are not effective.  If you are 45 to 49 years old, ask your caregiver if you should take aspirin to prevent heart disease.  Diabetes screening involves taking a blood sample to check your fasting blood sugar level. This should be done once every 3 years, after age 45, if you are within normal weight and without risk factors for diabetes. Testing should be considered at a younger age or be carried out more frequently if you are overweight and have at least 1 risk factor for diabetes.  Colorectal cancer can be detected and often prevented. Most routine colorectal cancer screening begins at the age of 50 and continues through age 75. However, your caregiver may recommend screening at an earlier age if you have risk factors for colon cancer. On a yearly basis, your caregiver may provide home test kits to check for hidden blood in the stool. Use of a small camera at the end of a tube,   to directly examine the colon (sigmoidoscopy or colonoscopy), can detect the earliest forms of colorectal cancer. Talk to your caregiver about this at age 50, when routine screening begins. Direct examination of the colon should be repeated every 5 to 10 years through age 75, unless early forms of pre-cancerous polyps or small growths are found.  Hepatitis C blood testing is recommended for all people born from 1945 through 1965 and any individual with known risks for hepatitis C.  Healthy  men should no longer receive prostate-specific antigen (PSA) blood tests as part of routine cancer screening. Consult with your caregiver about prostate cancer screening.  Testicular cancer screening is not recommended for adolescents or adult males who have no symptoms. Screening includes self-exam, caregiver exam, and other screening tests. Consult with your caregiver about any symptoms you have or any concerns you have about testicular cancer.  Practice safe sex. Use condoms and avoid high-risk sexual practices to reduce the spread of sexually transmitted infections (STIs).  Use sunscreen with a sun protection factor (SPF) of 30 or greater. Apply sunscreen liberally and repeatedly throughout the day. You should seek shade when your shadow is shorter than you. Protect yourself by wearing long sleeves, pants, a wide-brimmed hat, and sunglasses year round, whenever you are outdoors.  Notify your caregiver of new moles or changes in moles, especially if there is a change in shape or color. Also notify your caregiver if a mole is larger than the size of a pencil eraser.  A one-time screening for abdominal aortic aneurysm (AAA) and surgical repair of large AAAs by sound wave imaging (ultrasonography) is recommended for ages 65 to 75 years who are current or former smokers.  Stay current with your immunizations. Document Released: 11/08/2007 Document Revised: 08/04/2011 Document Reviewed: 10/07/2010 [employer] Patient Information 2013 [employer]. Diabetes, Type 2 Diabetes is a long-lasting (chronic) disease. In type 2 diabetes, the pancreas does not make enough insulin (a hormone), and the body does not respond normally to the insulin that is made. This type of diabetes was also previously called adult-onset diabetes. It usually occurs after the age of 40, but it can occur at any age.  CAUSES  Type 2 diabetes happens because the pancreasis not making enough insulin or your body has trouble using  the insulin that your pancreas does make properly. SYMPTOMS   Drinking more than usual.  Urinating more than usual.  Blurred vision.  Dry, itchy skin.  Frequent infections.  Feeling more tired than usual (fatigue). DIAGNOSIS The diagnosis of type 2 diabetes is usually made by one of the following tests:  Fasting blood glucose test. You will not eat for at least 8 hours and then take a blood test.  Random blood glucose test. Your blood glucose (sugar) is checked at any time of the day regardless of when you ate.  Oral glucose tolerance test (OGTT). Your blood glucose is measured after you have not eaten (fasted) and then after you drink a glucose containing beverage. TREATMENT   Healthy eating.  Exercise.  Medicine, if needed.  Monitoring blood glucose.  Seeing your caregiver regularly. HOME CARE INSTRUCTIONS   Check your blood glucose at least once a day. More frequent monitoring may be necessary, depending on your medicines and on how well your diabetes is controlled. Your caregiver will advise you.  Take your medicine as directed by your caregiver.  Do not smoke.  Make wise food choices. Ask your caregiver for information. Weight loss can improve your diabetes.    Learn about low blood glucose (hypoglycemia) and how to treat it.  Get your eyes checked regularly.  Have a yearly physical exam. Have your blood pressure checked and your blood and urine tested.  Wear a pendant or bracelet saying that you have diabetes.  Check your feet every night for cuts, sores, blisters, and redness. Let your caregiver know if you have any problems. SEEK MEDICAL CARE IF:   You have problems keeping your blood glucose in target range.  You have problems with your medicines.  You have symptoms of an illness that do not improve after 24 hours.  You have a sore or wound that is not healing.  You notice a change in vision or a new problem with your vision.  You have a  fever. MAKE SURE YOU:  Understand these instructions.  Will watch your condition.  Will get help right away if you are not doing well or get worse. Document Released: 05/12/2005 Document Revised: 08/04/2011 Document Reviewed: 10/28/2010 [employer] Patient Information 2013 [employer].  

## 2012-09-15 NOTE — Telephone Encounter (Signed)
Patient needs a 90 day refill on metformin.  Would like RX sent to [facility] on [address].  Please give patient a call once sent.

## 2012-09-15 NOTE — Telephone Encounter (Signed)
Pt notified//[email or web address] 

## 2012-09-16 ENCOUNTER — Encounter: Payer: Self-pay | Admitting: Internal Medicine

## 2012-09-16 LAB — DIGOXIN LEVEL: Digoxin Level: 0.5 ng/mL — ABNORMAL LOW (ref 0.8–2.0)

## 2012-09-16 NOTE — Assessment & Plan Note (Signed)
He has achieved his goal of LDL < 100 He is doing well on pravachol

## 2012-09-16 NOTE — Assessment & Plan Note (Signed)
His BP is well controlled Today I will check his lytes and renal function 

## 2012-09-16 NOTE — Assessment & Plan Note (Signed)
I will check his A1C and will make changes if needed

## 2012-09-16 NOTE — Assessment & Plan Note (Signed)
Exam done Vaccines were updated Labs ordered Pt ed material was given 

## 2012-09-16 NOTE — Assessment & Plan Note (Signed)
He has good rate and rhythm control today. 

## 2012-09-29 ENCOUNTER — Ambulatory Visit (INDEPENDENT_AMBULATORY_CARE_PROVIDER_SITE_OTHER): Payer: 59 | Admitting: General Practice

## 2012-09-29 DIAGNOSIS — I4891 Unspecified atrial fibrillation: Secondary | ICD-10-CM

## 2012-09-29 LAB — POCT INR: INR: 2

## 2012-10-27 ENCOUNTER — Ambulatory Visit (INDEPENDENT_AMBULATORY_CARE_PROVIDER_SITE_OTHER): Payer: 59 | Admitting: Family Medicine

## 2012-10-27 DIAGNOSIS — I4891 Unspecified atrial fibrillation: Secondary | ICD-10-CM

## 2012-10-27 LAB — POCT INR: INR: 2.9

## 2012-11-12 ENCOUNTER — Ambulatory Visit (INDEPENDENT_AMBULATORY_CARE_PROVIDER_SITE_OTHER): Payer: 59 | Admitting: Family

## 2012-11-12 ENCOUNTER — Ambulatory Visit: Payer: 59 | Admitting: Internal Medicine

## 2012-11-12 ENCOUNTER — Encounter: Payer: Self-pay | Admitting: Family

## 2012-11-12 VITALS — BP 112/80 | HR 66 | Wt 274.0 lb

## 2012-11-12 DIAGNOSIS — M25579 Pain in unspecified ankle and joints of unspecified foot: Secondary | ICD-10-CM

## 2012-11-12 DIAGNOSIS — I10 Essential (primary) hypertension: Secondary | ICD-10-CM

## 2012-11-12 DIAGNOSIS — M25572 Pain in left ankle and joints of left foot: Secondary | ICD-10-CM

## 2012-11-12 DIAGNOSIS — E119 Type 2 diabetes mellitus without complications: Secondary | ICD-10-CM

## 2012-11-12 DIAGNOSIS — I4891 Unspecified atrial fibrillation: Secondary | ICD-10-CM

## 2012-11-12 DIAGNOSIS — S93409A Sprain of unspecified ligament of unspecified ankle, initial encounter: Secondary | ICD-10-CM

## 2012-11-12 DIAGNOSIS — S93402A Sprain of unspecified ligament of left ankle, initial encounter: Secondary | ICD-10-CM

## 2012-11-12 LAB — POCT INR: INR: 2.4

## 2012-11-12 MED ORDER — TRAMADOL HCL 50 MG PO TABS: 50.0000 mg | ORAL_TABLET | Freq: Three times a day (TID) | ORAL | Status: DC | PRN

## 2012-11-12 NOTE — Patient Instructions (Addendum)
Continue 1/2 pill everyday except 1 pill on Sundays and Thursdays. Recheck in 4 weeks.  Anticoagulation Dose Instructions as of 11/12/2012     Glynis Smiles Tue Wed Thu Fri Sat   New Dose 10 mg 5 mg 5 mg 5 mg 10 mg 5 mg 5 mg    Description       Continue 1/2 pill everyday except 1 pill on Sundays and Thursdays. Recheck in 4 weeks.

## 2012-11-12 NOTE — Patient Instructions (Signed)

## 2012-11-12 NOTE — Progress Notes (Signed)
Subjective:    Patient ID: Daniel Grant, male    DOB: 1966/03/29, 47 y.o.   MRN: [id number]  HPI  47 year old African American male, nonsmoker, patient of Dr. Yetta Barre with a history of type 2 diabetes, obesity, hypertension hyperlipidemia, and atrial fibrillation is in today with complaints of left ankle pain and swelling after injuring it on alum or 2 days ago. Patient reports hitting the side of his ankle on the lawnmower and believes that it may be cause of the pain that he now has. Initially, he did not notice much pain or swelling. Although last day or so it has become more swollen and tender. He taken Tylenol that helps with the pain. He reports he elevated at night that decreases the swelling. Rates the pain a 5/10, worse with weightbearing.  Review of Systems  Constitutional: Negative.   Respiratory: Negative.   Cardiovascular: Negative.   Gastrointestinal: Negative.   Musculoskeletal: Positive for arthralgias.       Left ankle pain and swelling  Skin: Negative.   Neurological: Negative.   Hematological: Negative.   Psychiatric/Behavioral: Negative.    Past Medical History  Diagnosis Date  . Hyperlipidemia   . OSA (obstructive sleep apnea)   . Hypertension   . DM II (diabetes mellitus, type II), controlled   . Paroxysmal atrial fibrillation   . Obesity   . Erectile dysfunction     History   Social History  . Marital Status: Married    Spouse Name: N/A    Number of Children: N/A  . Years of Education: N/A   Occupational History  . Not on file.   Social History Main Topics  . Smoking status: Former Smoker    Quit date: 05/26/1994  . Smokeless tobacco: Never Used     Comment: quit in 1995  . Alcohol Use: 3.0 oz/week    5 Cans of beer per week  . Drug Use: No  . Sexually Active: Yes   Other Topics Concern  . Not on file   Social History Narrative  . No narrative on file    Past Surgical History  Procedure Laterality Date  . Cardiac catheterization     . Finger surgery      left 1st finger    Family History  Problem Relation Age of Onset  . Heart attack Paternal Uncle   . Heart attack Maternal Grandmother   . Heart disease Mother   . Hypertension Mother   . Diabetes Mother   . Alcohol abuse Father   . Cancer Neg Hx   . Early death Neg Hx   . Hyperlipidemia Neg Hx   . Kidney disease Neg Hx   . Stroke Neg Hx     No Known Allergies  Current Outpatient Prescriptions on File Prior to Visit  Medication Sig Dispense Refill  . amLODipine (NORVASC) 10 MG tablet Take 1 tablet (10 mg total) by mouth daily.  90 tablet  1  . aspirin 81 MG EC tablet Take 81 mg by mouth daily.        . benazepril (LOTENSIN) 20 MG tablet Take 1 tablet (20 mg total) by mouth daily.  90 tablet  1  . digoxin (LANOXIN) 0.125 MG tablet Take 1 tablet (125 mcg total) by mouth daily.  90 tablet  1  . flecainide (TAMBOCOR) 100 MG tablet Take 1 tablet (100 mg total) by mouth 2 (two) times daily.  180 tablet  1  . fluticasone (FLONASE) 50 MCG/ACT nasal spray 2  sprays by Nasal route daily.        . hydrochlorothiazide (HYDRODIURIL) 12.5 MG tablet Take 1 tablet (12.5 mg total) by mouth daily.  90 tablet  1  . metFORMIN (GLUCOPHAGE-XR) 500 MG 24 hr tablet TAKE TWO TABLETS BY MOUTH EVERY DAY WITH BREAKFAST  180 tablet  1  . metoprolol succinate (TOPROL-XL) 100 MG 24 hr tablet Take 1 tablet (100 mg total) by mouth daily.  90 tablet  1  . Multiple Vitamin (MULTIVITAMIN) tablet Take 1 tablet by mouth daily.        . pravastatin (PRAVACHOL) 40 MG tablet Take 1 tablet (40 mg total) by mouth daily.  90 tablet  1  . warfarin (COUMADIN) 10 MG tablet Take as directed by Anticoagulation clinic  120 tablet  0   No current facility-administered medications on file prior to visit.    BP 112/80  Pulse 66  Wt 274 lb (124.286 kg)  BMI 39.32 kg/m2  SpO2 98%chart    Objective:   Physical Exam  Constitutional: He is oriented to person, place, and time. He appears well-developed and  well-nourished.  HENT:  Right Ear: External ear normal.  Left Ear: External ear normal.  Nose: Nose normal.  Mouth/Throat: Oropharynx is clear and moist.  Neck: Normal range of motion. Neck supple. No thyromegaly present.  Cardiovascular: Normal rate, regular rhythm and normal heart sounds.   Pulmonary/Chest: Effort normal and breath sounds normal.  Abdominal: Soft. Bowel sounds are normal.  Neurological: He is alert and oriented to person, place, and time. He has normal reflexes.  Skin: Skin is warm and dry.  Psychiatric: He has a normal mood and affect.          Assessment & Plan:  Assessment:  1. Left ankle sprain 2. Atrial fibrillation 3. Obesity 4. Hypertension 5. Hyperlipidemia  Plan: Patient's left ankle and foot was wrapped with an Ace wrap today. Tramadol as needed for pain. INR done today and was normal. Recheck with Cindi in 6 weeks. Elevate the foot. Ice 15-20 minutes 3-4 times a day. Out of work today. Patient call the office if symptoms worsen or persist. Recheck as scheduled, and as needed.

## 2012-11-29 ENCOUNTER — Telehealth: Payer: Self-pay | Admitting: Cardiology

## 2012-11-29 NOTE — Telephone Encounter (Signed)
New Problem  PT is schedule to have a dental cleaning done pm Wednesday and the wife wants to know if it will be ok for him to have this done.

## 2012-11-29 NOTE — Telephone Encounter (Signed)
Left message for pt wife, okay for dental work with no antibiotics needed. Let the dentist know about taking coumadin in case of extra bleeding.

## 2012-12-01 ENCOUNTER — Encounter: Payer: Self-pay | Admitting: Cardiology

## 2012-12-13 ENCOUNTER — Ambulatory Visit: Payer: 59 | Admitting: Cardiology

## 2012-12-16 ENCOUNTER — Ambulatory Visit: Payer: 59 | Admitting: Cardiology

## 2012-12-22 ENCOUNTER — Ambulatory Visit (INDEPENDENT_AMBULATORY_CARE_PROVIDER_SITE_OTHER): Payer: 59 | Admitting: General Practice

## 2012-12-22 DIAGNOSIS — I4891 Unspecified atrial fibrillation: Secondary | ICD-10-CM

## 2012-12-22 LAB — POCT INR: INR: 2.9

## 2013-01-10 ENCOUNTER — Other Ambulatory Visit: Payer: Self-pay

## 2013-01-10 ENCOUNTER — Ambulatory Visit (INDEPENDENT_AMBULATORY_CARE_PROVIDER_SITE_OTHER): Payer: 59 | Admitting: Cardiology

## 2013-01-10 ENCOUNTER — Encounter: Payer: Self-pay | Admitting: Cardiology

## 2013-01-10 VITALS — BP 120/78 | HR 57 | Ht 70.0 in | Wt 276.8 lb

## 2013-01-10 DIAGNOSIS — E7800 Pure hypercholesterolemia, unspecified: Secondary | ICD-10-CM

## 2013-01-10 DIAGNOSIS — I10 Essential (primary) hypertension: Secondary | ICD-10-CM

## 2013-01-10 DIAGNOSIS — I4891 Unspecified atrial fibrillation: Secondary | ICD-10-CM

## 2013-01-10 MED ORDER — HYDROCHLOROTHIAZIDE 12.5 MG PO TABS: 12.5000 mg | ORAL_TABLET | Freq: Every day | ORAL | Status: DC

## 2013-01-10 MED ORDER — BENAZEPRIL HCL 20 MG PO TABS: 20.0000 mg | ORAL_TABLET | Freq: Every day | ORAL | Status: DC

## 2013-01-10 MED ORDER — METOPROLOL SUCCINATE ER 100 MG PO TB24: 100.0000 mg | ORAL_TABLET | Freq: Every day | ORAL | Status: DC

## 2013-01-10 MED ORDER — FLECAINIDE ACETATE 100 MG PO TABS: 100.0000 mg | ORAL_TABLET | Freq: Two times a day (BID) | ORAL | Status: DC

## 2013-01-10 MED ORDER — AMLODIPINE BESYLATE 10 MG PO TABS: 10.0000 mg | ORAL_TABLET | Freq: Every day | ORAL | Status: DC

## 2013-01-10 MED ORDER — PRAVASTATIN SODIUM 40 MG PO TABS: 40.0000 mg | ORAL_TABLET | Freq: Every day | ORAL | Status: DC

## 2013-01-10 NOTE — Assessment & Plan Note (Signed)
Blood pressure controlled.continue present medications. 

## 2013-01-10 NOTE — Assessment & Plan Note (Signed)
Patient remains in sinus rhythm. Continue beta blocker and flecainide. Embolic risk factors of hypertension and diabetes mellitus. Continue Coumadin. I have given him the names of the new oral anticoagulant agents. He will check with his insurance company and if covered we will consider changing. Discontinue digoxin. Discontinue aspirin.

## 2013-01-10 NOTE — Progress Notes (Signed)
HPI: 47 year old male previously followed by Dr. Riley Kill for followup of atrial fibrillation. Cardiac catheterization in January of 2001 showed normal LV function and normal coronary arteries.Transesophageal echocardiogram in March of 2011 showed an ejection fraction of 50%. There was mild biatrial enlargement and mild mitral regurgitation. Nuclear study in June of 2011 showed soft tissue attenuation and no ischemia. Patient has been treated with flecainide for atrial fibrillation. He did have an exercise treadmill in September of 2011 and showed no exercise induced ventricular tachycardia. Patient last seen in March of 2014. Since that time, the patient denies any dyspnea on exertion, orthopnea, PND, pedal edema, palpitations, syncope or chest pain.   Current Outpatient Prescriptions  Medication Sig Dispense Refill  . amLODipine (NORVASC) 10 MG tablet Take 1 tablet (10 mg total) by mouth daily.  90 tablet  1  . aspirin 81 MG EC tablet Take 81 mg by mouth daily.        . benazepril (LOTENSIN) 20 MG tablet Take 1 tablet (20 mg total) by mouth daily.  90 tablet  1  . digoxin (LANOXIN) 0.125 MG tablet Take 1 tablet (125 mcg total) by mouth daily.  90 tablet  1  . flecainide (TAMBOCOR) 100 MG tablet Take 1 tablet (100 mg total) by mouth 2 (two) times daily.  180 tablet  1  . fluticasone (FLONASE) 50 MCG/ACT nasal spray 2 sprays by Nasal route daily.        . hydrochlorothiazide (HYDRODIURIL) 12.5 MG tablet Take 1 tablet (12.5 mg total) by mouth daily.  90 tablet  1  . metFORMIN (GLUCOPHAGE-XR) 500 MG 24 hr tablet TAKE TWO TABLETS BY MOUTH EVERY DAY WITH BREAKFAST  180 tablet  1  . metoprolol succinate (TOPROL-XL) 100 MG 24 hr tablet Take 1 tablet (100 mg total) by mouth daily.  90 tablet  1  . Multiple Vitamin (MULTIVITAMIN) tablet Take 1 tablet by mouth daily.        . pravastatin (PRAVACHOL) 40 MG tablet Take 1 tablet (40 mg total) by mouth daily.  90 tablet  1  . sildenafil (VIAGRA) 50 MG tablet  Take 50 mg by mouth daily as needed for erectile dysfunction.      Marland Kitchen warfarin (COUMADIN) 10 MG tablet Take as directed by Anticoagulation clinic  120 tablet  0   No current facility-administered medications for this visit.     Past Medical History  Diagnosis Date  . Hyperlipidemia   . OSA (obstructive sleep apnea)   . Hypertension   . DM II (diabetes mellitus, type II), controlled   . Paroxysmal atrial fibrillation   . Obesity   . Erectile dysfunction     Past Surgical History  Procedure Laterality Date  . Cardiac catheterization    . Finger surgery      left 1st finger    History   Social History  . Marital Status: Married    Spouse Name: N/A    Number of Children: N/A  . Years of Education: N/A   Occupational History  . Not on file.   Social History Main Topics  . Smoking status: Former Smoker    Quit date: 05/26/1994  . Smokeless tobacco: Never Used     Comment: quit in 1995  . Alcohol Use: 3.0 oz/week    5 Cans of beer per week  . Drug Use: No  . Sexual Activity: Yes   Other Topics Concern  . Not on file   Social History Narrative  . No  narrative on file    ROS: no fevers or chills, productive cough, hemoptysis, dysphasia, odynophagia, melena, hematochezia, dysuria, hematuria, rash, seizure activity, orthopnea, PND, pedal edema, claudication. Remaining systems are negative.  Physical Exam: Well-developed obese in no acute distress.  Skin is warm and dry.  HEENT is normal.  Neck is supple.  Chest is clear to auscultation with normal expansion.  Cardiovascular exam is regular rate and rhythm.  Abdominal exam nontender or distended. No masses palpated. Extremities show no edema. neuro grossly intact  ECG sinus rhythm at a rate of 58. No ST changes.

## 2013-01-10 NOTE — Patient Instructions (Addendum)
Your physician wants you to follow-up in: 6 MONTHS WITH DR Jens Som You will receive a reminder letter in the mail two months in advance. If you don't receive a letter, please call our office to schedule the follow-up appointment.   STOP ASPIRIN  STOP DIGOXIN  XARELTO OR ELIQUIS TO REPLACE WARFARIN

## 2013-01-10 NOTE — Assessment & Plan Note (Signed)
Continue statin. 

## 2013-01-14 ENCOUNTER — Ambulatory Visit: Payer: 59 | Admitting: Internal Medicine

## 2013-02-02 ENCOUNTER — Ambulatory Visit (INDEPENDENT_AMBULATORY_CARE_PROVIDER_SITE_OTHER): Payer: 59

## 2013-02-02 DIAGNOSIS — I4891 Unspecified atrial fibrillation: Secondary | ICD-10-CM

## 2013-02-02 LAB — POCT INR: INR: 2.8

## 2013-02-07 ENCOUNTER — Encounter: Payer: Self-pay | Admitting: Internal Medicine

## 2013-02-07 ENCOUNTER — Ambulatory Visit (INDEPENDENT_AMBULATORY_CARE_PROVIDER_SITE_OTHER): Payer: BC Managed Care – PPO | Admitting: Internal Medicine

## 2013-02-07 ENCOUNTER — Other Ambulatory Visit (INDEPENDENT_AMBULATORY_CARE_PROVIDER_SITE_OTHER): Payer: BC Managed Care – PPO

## 2013-02-07 VITALS — BP 116/72 | HR 63 | Temp 98.3°F | Resp 16 | Wt 270.0 lb

## 2013-02-07 DIAGNOSIS — I10 Essential (primary) hypertension: Secondary | ICD-10-CM

## 2013-02-07 DIAGNOSIS — IMO0001 Reserved for inherently not codable concepts without codable children: Secondary | ICD-10-CM

## 2013-02-07 DIAGNOSIS — I4891 Unspecified atrial fibrillation: Secondary | ICD-10-CM

## 2013-02-07 DIAGNOSIS — E7800 Pure hypercholesterolemia, unspecified: Secondary | ICD-10-CM

## 2013-02-07 DIAGNOSIS — Z23 Encounter for immunization: Secondary | ICD-10-CM

## 2013-02-07 LAB — BASIC METABOLIC PANEL
BUN: 20 mg/dL (ref 6–23)
CO2: 28 mEq/L (ref 19–32)
Calcium: 9.1 mg/dL (ref 8.4–10.5)
Chloride: 106 mEq/L (ref 96–112)
Creatinine, Ser: 0.9 mg/dL (ref 0.4–1.5)
GFR: 116.61 mL/min (ref >60.00)
Glucose, Bld: 165 mg/dL — ABNORMAL HIGH (ref 70–99)
Potassium: 4.4 mEq/L (ref 3.5–5.1)
Sodium: 139 mEq/L (ref 135–145)

## 2013-02-07 LAB — HEMOGLOBIN A1C: Hgb A1c MFr Bld: 7 % — ABNORMAL HIGH (ref 4.6–6.5)

## 2013-02-07 MED ORDER — FLUTICASONE PROPIONATE 50 MCG/ACT NA SUSP: 2.0000 | Freq: Every day | NASAL | Status: DC

## 2013-02-07 MED ORDER — METFORMIN HCL ER 500 MG PO TB24: ORAL_TABLET | ORAL | Status: DC

## 2013-02-07 NOTE — Patient Instructions (Signed)
Type 2 Diabetes Mellitus, Adult Type 2 diabetes mellitus, often simply referred to as type 2 diabetes, is a long-lasting (chronic) disease. In type 2 diabetes, the pancreas does not make enough insulin (a hormone), the cells are less responsive to the insulin that is made (insulin resistance), or both. Normally, insulin moves sugars from food into the tissue cells. The tissue cells use the sugars for energy. The lack of insulin or the lack of normal response to insulin causes excess sugars to build up in the blood instead of going into the tissue cells. As a result, high blood sugar (hyperglycemia) develops. The effect of high sugar (glucose) levels can cause many complications. Type 2 diabetes was also previously called adult-onset diabetes but it can occur at any age.  RISK FACTORS  A person is predisposed to developing type 2 diabetes if someone in the family has the disease and also has one or more of the following primary risk factors:  Overweight.  An inactive lifestyle.  A history of consistently eating high-calorie foods. Maintaining a normal weight and regular physical activity can reduce the chance of developing type 2 diabetes. SYMPTOMS  A person with type 2 diabetes may not show symptoms initially. The symptoms of type 2 diabetes appear slowly. The symptoms include:  Increased thirst (polydipsia).  Increased urination (polyuria).  Increased urination during the night (nocturia).  Weight loss. This weight loss may be rapid.  Frequent, recurring infections.  Tiredness (fatigue).  Weakness.  Vision changes, such as blurred vision.  Fruity smell to your breath.  Abdominal pain.  Nausea or vomiting.  Cuts or bruises which are slow to heal.  Tingling or numbness in the hands or feet. DIAGNOSIS Type 2 diabetes is frequently not diagnosed until complications of diabetes are present. Type 2 diabetes is diagnosed when symptoms or complications are present and when blood  glucose levels are increased. Your blood glucose level may be checked by one or more of the following blood tests:  A fasting blood glucose test. You will not be allowed to eat for at least 8 hours before a blood sample is taken.  A random blood glucose test. Your blood glucose is checked at any time of the day regardless of when you ate.  A hemoglobin A1c blood glucose test. A hemoglobin A1c test provides information about blood glucose control over the previous 3 months.  An oral glucose tolerance test (OGTT). Your blood glucose is measured after you have not eaten (fasted) for 2 hours and then after you drink a glucose-containing beverage. TREATMENT   You may need to take insulin or diabetes medicine daily to keep blood glucose levels in the desired range.  You will need to match insulin dosing with exercise and healthy food choices. The treatment goal is to maintain the before meal blood sugar (preprandial glucose) level at 70 130 mg/dL. HOME CARE INSTRUCTIONS   Have your hemoglobin A1c level checked twice a year.  Perform daily blood glucose monitoring as directed by your caregiver.  Monitor urine ketones when you are ill and as directed by your caregiver.  Take your diabetes medicine or insulin as directed by your caregiver to maintain your blood glucose levels in the desired range.  Never run out of diabetes medicine or insulin. It is needed every day.  Adjust insulin based on your intake of carbohydrates. Carbohydrates can raise blood glucose levels but need to be included in your diet. Carbohydrates provide vitamins, minerals, and fiber which are an essential part of   a healthy diet. Carbohydrates are found in fruits, vegetables, whole grains, dairy products, legumes, and foods containing added sugars.    Eat healthy foods. Alternate 3 meals with 3 snacks.  Lose weight if overweight.  Carry a medical alert card or wear your medical alert jewelry.  Carry a 15 gram  carbohydrate snack with you at all times to treat low blood glucose (hypoglycemia). Some examples of 15 gram carbohydrate snacks include:  Glucose tablets, 3 or 4   Glucose gel, 15 gram tube  Raisins, 2 tablespoons (24 grams)  Jelly beans, 6  Animal crackers, 8  Regular pop, 4 ounces (120 mL)  Gummy treats, 9  Recognize hypoglycemia. Hypoglycemia occurs with blood glucose levels of 70 mg/dL and below. The risk for hypoglycemia increases when fasting or skipping meals, during or after intense exercise, and during sleep. Hypoglycemia symptoms can include:  Tremors or shakes.  Decreased ability to concentrate.  Sweating.  Increased heart rate.  Headache.  Dry mouth.  Hunger.  Irritability.  Anxiety.  Restless sleep.  Altered speech or coordination.  Confusion.  Treat hypoglycemia promptly. If you are alert and able to safely swallow, follow the 15:15 rule:  Take 15 20 grams of rapid-acting glucose or carbohydrate. Rapid-acting options include glucose gel, glucose tablets, or 4 ounces (120 mL) of fruit juice, regular soda, or low fat milk.  Check your blood glucose level 15 minutes after taking the glucose.  Take 15 20 grams more of glucose if the repeat blood glucose level is still 70 mg/dL or below.  Eat a meal or snack within 1 hour once blood glucose levels return to normal.    Be alert to polyuria and polydipsia which are early signs of hyperglycemia. An early awareness of hyperglycemia allows for prompt treatment. Treat hyperglycemia as directed by your caregiver.  Engage in at least 150 minutes of moderate-intensity physical activity a week, spread over at least 3 days of the week or as directed by your caregiver. In addition, you should engage in resistance exercise at least 2 times a week or as directed by your caregiver.  Adjust your medicine and food intake as needed if you start a new exercise or sport.  Follow your sick day plan at any time you  are unable to eat or drink as usual.  Avoid tobacco use.  Limit alcohol intake to no more than 1 drink per day for nonpregnant women and 2 drinks per day for men. You should drink alcohol only when you are also eating food. Talk with your caregiver whether alcohol is safe for you. Tell your caregiver if you drink alcohol several times a week.  Follow up with your caregiver regularly.  Schedule an eye exam soon after the diagnosis of type 2 diabetes and then annually.  Perform daily skin and foot care. Examine your skin and feet daily for cuts, bruises, redness, nail problems, bleeding, blisters, or sores. A foot exam by a caregiver should be done annually.  Brush your teeth and gums at least twice a day and floss at least once a day. Follow up with your dentist regularly.  Share your diabetes management plan with your workplace or school.  Stay up-to-date with immunizations.  Learn to manage stress.  Obtain ongoing diabetes education and support as needed.  Participate in, or seek rehabilitation as needed to maintain or improve independence and quality of life. Request a physical or occupational therapy referral if you are having foot or hand numbness or difficulties with grooming,   dressing, eating, or physical activity. SEEK MEDICAL CARE IF:   You are unable to eat food or drink fluids for more than 6 hours.  You have nausea and vomiting for more than 6 hours.  Your blood glucose level is over 240 mg/dL.  There is a change in mental status.  You develop an additional serious illness.  You have diarrhea for more than 6 hours.  You have been sick or have had a fever for a couple of days and are not getting better.  You have pain during any physical activity.  SEEK IMMEDIATE MEDICAL CARE IF:  You have difficulty breathing.  You have moderate to large ketone levels. MAKE SURE YOU:  Understand these instructions.  Will watch your condition.  Will get help right away if  you are not doing well or get worse. Document Released: 05/12/2005 Document Revised: 02/04/2012 Document Reviewed: 12/09/2011 [employer] Patient Information 2014 [employer].  

## 2013-02-07 NOTE — Progress Notes (Signed)
Subjective:    Patient ID: Daniel Grant, male    DOB: 09/12/1965, 46 y.o.   MRN: [id number]  Diabetes He presents for his follow-up diabetic visit. He has type 2 diabetes mellitus. His disease course has been stable. There are no hypoglycemic associated symptoms. Pertinent negatives for hypoglycemia include no dizziness, speech difficulty or tremors. Pertinent negatives for diabetes include no blurred vision, no chest pain, no fatigue, no foot paresthesias, no foot ulcerations, no polydipsia, no polyphagia, no polyuria, no visual change, no weakness and no weight loss. There are no hypoglycemic complications. There are no diabetic complications. Current diabetic treatment includes oral agent (monotherapy). He is compliant with treatment all of the time. His weight is stable. He is following a generally healthy diet. Meal planning includes avoidance of concentrated sweets. He participates in exercise intermittently. There is no change in his home blood glucose trend. An ACE inhibitor/angiotensin II receptor blocker is being taken. He does not see a podiatrist.Eye exam is current.      Review of Systems  Constitutional: Negative.  Negative for fever, chills, weight loss, diaphoresis, appetite change and fatigue.  HENT: Negative.   Eyes: Negative.  Negative for blurred vision.  Respiratory: Negative.  Negative for cough, chest tightness, shortness of breath, wheezing and stridor.   Cardiovascular: Negative.  Negative for chest pain, palpitations and leg swelling.  Gastrointestinal: Negative.  Negative for nausea, vomiting, abdominal pain, diarrhea and constipation.  Endocrine: Negative.  Negative for polydipsia, polyphagia and polyuria.  Genitourinary: Negative.   Musculoskeletal: Negative.  Negative for myalgias, back pain, joint swelling, arthralgias and gait problem.  Skin: Negative.   Allergic/Immunologic: Negative.   Neurological: Negative.  Negative for dizziness, tremors, speech  difficulty, weakness, light-headedness and numbness.  Hematological: Negative.  Negative for adenopathy. Does not bruise/bleed easily.  Psychiatric/Behavioral: Negative.        Objective:   Physical Exam  Vitals reviewed. Constitutional: He is oriented to person, place, and time. He appears well-developed and well-nourished. No distress.  HENT:  Head: Normocephalic and atraumatic.  Mouth/Throat: Oropharynx is clear and moist. No oropharyngeal exudate.  Eyes: Conjunctivae are normal. Right eye exhibits no discharge. Left eye exhibits no discharge. No scleral icterus.  Neck: Normal range of motion. Neck supple. No JVD present. No tracheal deviation present. No thyromegaly present.  Cardiovascular: Normal rate, regular rhythm, normal heart sounds and intact distal pulses.  Exam reveals no gallop and no friction rub.   No murmur heard. Pulmonary/Chest: Effort normal and breath sounds normal. No stridor. No respiratory distress. He has no wheezes. He has no rales. He exhibits no tenderness.  Abdominal: Soft. Bowel sounds are normal. He exhibits no distension and no mass. There is no tenderness. There is no rebound and no guarding.  Musculoskeletal: Normal range of motion. He exhibits no edema and no tenderness.  Lymphadenopathy:    He has no cervical adenopathy.  Neurological: He is oriented to person, place, and time.  Skin: Skin is warm and dry. No rash noted. He is not diaphoretic. No erythema. No pallor.  Psychiatric: He has a normal mood and affect. His behavior is normal. Judgment and thought content normal.     Lab Results  Component Value Date   WBC 8.9 09/10/2011   HGB 13.8 09/10/2011   HCT 40.7 09/10/2011   PLT 237.0 09/10/2011   GLUCOSE 136* 09/15/2012   CHOL 132 08/05/2012   TRIG 169.0* 08/05/2012   HDL 28.90* 08/05/2012   LDLCALC 69 08/05/2012   ALT 29  09/15/2012   AST 20 09/15/2012   NA 137 09/15/2012   K 3.9 09/15/2012   CL 104 09/15/2012   CREATININE 0.8 09/15/2012   BUN 19  09/15/2012   CO2 26 09/15/2012   TSH 0.39 09/15/2012   PSA 0.37 09/15/2012   INR 2.8 02/02/2013   HGBA1C 7.1* 09/15/2012   MICROALBUR 1.0 09/15/2012       Assessment & Plan:

## 2013-02-08 NOTE — Assessment & Plan Note (Signed)
His blood pressure is well controlled His lytes and renal function are normal

## 2013-02-08 NOTE — Assessment & Plan Note (Signed)
This is well-controlled.

## 2013-02-08 NOTE — Assessment & Plan Note (Signed)
He is doing well on pravachol

## 2013-02-08 NOTE — Assessment & Plan Note (Signed)
He has good rate and rhythm control 

## 2013-02-10 ENCOUNTER — Ambulatory Visit: Payer: 59 | Admitting: Cardiology

## 2013-02-20 ENCOUNTER — Other Ambulatory Visit: Payer: Self-pay | Admitting: Internal Medicine

## 2013-02-21 ENCOUNTER — Other Ambulatory Visit: Payer: Self-pay | Admitting: *Deleted

## 2013-02-21 DIAGNOSIS — N529 Male erectile dysfunction, unspecified: Secondary | ICD-10-CM

## 2013-02-21 MED ORDER — SILDENAFIL CITRATE 50 MG PO TABS: 50.0000 mg | ORAL_TABLET | Freq: Every day | ORAL | Status: DC | PRN

## 2013-02-21 NOTE — Telephone Encounter (Signed)
Med refilled.

## 2013-03-16 ENCOUNTER — Ambulatory Visit (INDEPENDENT_AMBULATORY_CARE_PROVIDER_SITE_OTHER): Payer: BC Managed Care – PPO | Admitting: General Practice

## 2013-03-16 DIAGNOSIS — I4891 Unspecified atrial fibrillation: Secondary | ICD-10-CM

## 2013-03-16 LAB — POCT INR: INR: 2.1

## 2013-04-27 ENCOUNTER — Ambulatory Visit (INDEPENDENT_AMBULATORY_CARE_PROVIDER_SITE_OTHER): Payer: BC Managed Care – PPO | Admitting: General Practice

## 2013-04-27 DIAGNOSIS — I4891 Unspecified atrial fibrillation: Secondary | ICD-10-CM

## 2013-04-27 LAB — POCT INR: INR: 1.9

## 2013-05-12 LAB — HM DIABETES EYE EXAM

## 2013-06-09 ENCOUNTER — Other Ambulatory Visit (INDEPENDENT_AMBULATORY_CARE_PROVIDER_SITE_OTHER): Payer: BC Managed Care – PPO

## 2013-06-09 ENCOUNTER — Encounter: Payer: Self-pay | Admitting: Internal Medicine

## 2013-06-09 ENCOUNTER — Ambulatory Visit (INDEPENDENT_AMBULATORY_CARE_PROVIDER_SITE_OTHER): Payer: BC Managed Care – PPO | Admitting: Internal Medicine

## 2013-06-09 VITALS — BP 110/74 | HR 65 | Temp 98.6°F | Resp 16 | Ht 70.0 in | Wt 286.0 lb

## 2013-06-09 DIAGNOSIS — IMO0001 Reserved for inherently not codable concepts without codable children: Secondary | ICD-10-CM

## 2013-06-09 DIAGNOSIS — I10 Essential (primary) hypertension: Secondary | ICD-10-CM

## 2013-06-09 DIAGNOSIS — E1165 Type 2 diabetes mellitus with hyperglycemia: Principal | ICD-10-CM

## 2013-06-09 LAB — BASIC METABOLIC PANEL
BUN: 19 mg/dL (ref 6–23)
CO2: 25 mEq/L (ref 19–32)
Calcium: 8.7 mg/dL (ref 8.4–10.5)
Chloride: 106 mEq/L (ref 96–112)
Creatinine, Ser: 0.9 mg/dL (ref 0.4–1.5)
GFR: 117.98 mL/min (ref >60.00)
Glucose, Bld: 93 mg/dL (ref 70–99)
Potassium: 3.9 mEq/L (ref 3.5–5.1)
Sodium: 138 mEq/L (ref 135–145)

## 2013-06-09 LAB — HEMOGLOBIN A1C: Hgb A1c MFr Bld: 7.1 % — ABNORMAL HIGH (ref 4.6–6.5)

## 2013-06-09 MED ORDER — METFORMIN HCL ER 500 MG PO TB24: ORAL_TABLET | ORAL | Status: DC

## 2013-06-09 NOTE — Patient Instructions (Signed)
Type 2 Diabetes Mellitus, Adult Type 2 diabetes mellitus, often simply referred to as type 2 diabetes, is a long-lasting (chronic) disease. In type 2 diabetes, the pancreas does not make enough insulin (a hormone), the cells are less responsive to the insulin that is made (insulin resistance), or both. Normally, insulin moves sugars from food into the tissue cells. The tissue cells use the sugars for energy. The lack of insulin or the lack of normal response to insulin causes excess sugars to build up in the blood instead of going into the tissue cells. As a result, high blood sugar (hyperglycemia) develops. The effect of high sugar (glucose) levels can cause many complications. Type 2 diabetes was also previously called adult-onset diabetes but it can occur at any age.  RISK FACTORS  A person is predisposed to developing type 2 diabetes if someone in the family has the disease and also has one or more of the following primary risk factors:  Overweight.  An inactive lifestyle.  A history of consistently eating high-calorie foods. Maintaining a normal weight and regular physical activity can reduce the chance of developing type 2 diabetes. SYMPTOMS  A person with type 2 diabetes may not show symptoms initially. The symptoms of type 2 diabetes appear slowly. The symptoms include:  Increased thirst (polydipsia).  Increased urination (polyuria).  Increased urination during the night (nocturia).  Weight loss. This weight loss may be rapid.  Frequent, recurring infections.  Tiredness (fatigue).  Weakness.  Vision changes, such as blurred vision.  Fruity smell to your breath.  Abdominal pain.  Nausea or vomiting.  Cuts or bruises which are slow to heal.  Tingling or numbness in the hands or feet. DIAGNOSIS Type 2 diabetes is frequently not diagnosed until complications of diabetes are present. Type 2 diabetes is diagnosed when symptoms or complications are present and when blood  glucose levels are increased. Your blood glucose level may be checked by one or more of the following blood tests:  A fasting blood glucose test. You will not be allowed to eat for at least 8 hours before a blood sample is taken.  A random blood glucose test. Your blood glucose is checked at any time of the day regardless of when you ate.  A hemoglobin A1c blood glucose test. A hemoglobin A1c test provides information about blood glucose control over the previous 3 months.  An oral glucose tolerance test (OGTT). Your blood glucose is measured after you have not eaten (fasted) for 2 hours and then after you drink a glucose-containing beverage. TREATMENT   You may need to take insulin or diabetes medicine daily to keep blood glucose levels in the desired range.  You will need to match insulin dosing with exercise and healthy food choices. The treatment goal is to maintain the before meal blood sugar (preprandial glucose) level at 70 130 mg/dL. HOME CARE INSTRUCTIONS   Have your hemoglobin A1c level checked twice a year.  Perform daily blood glucose monitoring as directed by your caregiver.  Monitor urine ketones when you are ill and as directed by your caregiver.  Take your diabetes medicine or insulin as directed by your caregiver to maintain your blood glucose levels in the desired range.  Never run out of diabetes medicine or insulin. It is needed every day.  Adjust insulin based on your intake of carbohydrates. Carbohydrates can raise blood glucose levels but need to be included in your diet. Carbohydrates provide vitamins, minerals, and fiber which are an essential part of   a healthy diet. Carbohydrates are found in fruits, vegetables, whole grains, dairy products, legumes, and foods containing added sugars.    Eat healthy foods. Alternate 3 meals with 3 snacks.  Lose weight if overweight.  Carry a medical alert card or wear your medical alert jewelry.  Carry a 15 gram  carbohydrate snack with you at all times to treat low blood glucose (hypoglycemia). Some examples of 15 gram carbohydrate snacks include:  Glucose tablets, 3 or 4   Glucose gel, 15 gram tube  Raisins, 2 tablespoons (24 grams)  Jelly beans, 6  Animal crackers, 8  Regular pop, 4 ounces (120 mL)  Gummy treats, 9  Recognize hypoglycemia. Hypoglycemia occurs with blood glucose levels of 70 mg/dL and below. The risk for hypoglycemia increases when fasting or skipping meals, during or after intense exercise, and during sleep. Hypoglycemia symptoms can include:  Tremors or shakes.  Decreased ability to concentrate.  Sweating.  Increased heart rate.  Headache.  Dry mouth.  Hunger.  Irritability.  Anxiety.  Restless sleep.  Altered speech or coordination.  Confusion.  Treat hypoglycemia promptly. If you are alert and able to safely swallow, follow the 15:15 rule:  Take 15 20 grams of rapid-acting glucose or carbohydrate. Rapid-acting options include glucose gel, glucose tablets, or 4 ounces (120 mL) of fruit juice, regular soda, or low fat milk.  Check your blood glucose level 15 minutes after taking the glucose.  Take 15 20 grams more of glucose if the repeat blood glucose level is still 70 mg/dL or below.  Eat a meal or snack within 1 hour once blood glucose levels return to normal.    Be alert to polyuria and polydipsia which are early signs of hyperglycemia. An early awareness of hyperglycemia allows for prompt treatment. Treat hyperglycemia as directed by your caregiver.  Engage in at least 150 minutes of moderate-intensity physical activity a week, spread over at least 3 days of the week or as directed by your caregiver. In addition, you should engage in resistance exercise at least 2 times a week or as directed by your caregiver.  Adjust your medicine and food intake as needed if you start a new exercise or sport.  Follow your sick day plan at any time you  are unable to eat or drink as usual.  Avoid tobacco use.  Limit alcohol intake to no more than 1 drink per day for nonpregnant women and 2 drinks per day for men. You should drink alcohol only when you are also eating food. Talk with your caregiver whether alcohol is safe for you. Tell your caregiver if you drink alcohol several times a week.  Follow up with your caregiver regularly.  Schedule an eye exam soon after the diagnosis of type 2 diabetes and then annually.  Perform daily skin and foot care. Examine your skin and feet daily for cuts, bruises, redness, nail problems, bleeding, blisters, or sores. A foot exam by a caregiver should be done annually.  Brush your teeth and gums at least twice a day and floss at least once a day. Follow up with your dentist regularly.  Share your diabetes management plan with your workplace or school.  Stay up-to-date with immunizations.  Learn to manage stress.  Obtain ongoing diabetes education and support as needed.  Participate in, or seek rehabilitation as needed to maintain or improve independence and quality of life. Request a physical or occupational therapy referral if you are having foot or hand numbness or difficulties with grooming,   dressing, eating, or physical activity. SEEK MEDICAL CARE IF:   You are unable to eat food or drink fluids for more than 6 hours.  You have nausea and vomiting for more than 6 hours.  Your blood glucose level is over 240 mg/dL.  There is a change in mental status.  You develop an additional serious illness.  You have diarrhea for more than 6 hours.  You have been sick or have had a fever for a couple of days and are not getting better.  You have pain during any physical activity.  SEEK IMMEDIATE MEDICAL CARE IF:  You have difficulty breathing.  You have moderate to large ketone levels. MAKE SURE YOU:  Understand these instructions.  Will watch your condition.  Will get help right away if  you are not doing well or get worse. Document Released: 05/12/2005 Document Revised: 02/04/2012 Document Reviewed: 12/09/2011 [employer] Patient Information 2014 [employer].  

## 2013-06-09 NOTE — Progress Notes (Signed)
Pre-visit discussion using our clinic review tool. No additional management support is needed unless otherwise documented below in the visit note.  

## 2013-06-09 NOTE — Progress Notes (Signed)
Subjective:    Patient ID: Daniel Grant, male    DOB: 13-Feb-1966, 48 y.o.   MRN: [id number]  Diabetes He presents for his follow-up diabetic visit. He has type 2 diabetes mellitus. His disease course has been stable. There are no hypoglycemic associated symptoms. Pertinent negatives for hypoglycemia include no dizziness. Pertinent negatives for diabetes include no blurred vision, no chest pain, no fatigue, no foot paresthesias, no foot ulcerations, no polydipsia, no polyphagia, no polyuria, no visual change, no weakness and no weight loss. There are no hypoglycemic complications. Symptoms are stable. Current diabetic treatment includes oral agent (monotherapy). He is compliant with treatment all of the time. His weight is stable. He is following a generally healthy diet. Meal planning includes avoidance of concentrated sweets. He has not had a previous visit with a dietician. He participates in exercise intermittently. There is no change in his home blood glucose trend. An ACE inhibitor/angiotensin II receptor blocker is being taken. He does not see a podiatrist.Eye exam is current.      Review of Systems  Constitutional: Negative.  Negative for fever, chills, weight loss, diaphoresis, appetite change and fatigue.  HENT: Negative.   Eyes: Negative.  Negative for blurred vision.  Respiratory: Negative.  Negative for cough, choking, chest tightness, shortness of breath, wheezing and stridor.   Cardiovascular: Negative.  Negative for chest pain, palpitations and leg swelling.  Gastrointestinal: Negative.  Negative for nausea, vomiting, abdominal pain, diarrhea, constipation and blood in stool.  Endocrine: Negative.  Negative for polydipsia, polyphagia and polyuria.  Genitourinary: Negative.   Musculoskeletal: Negative.   Skin: Negative.   Allergic/Immunologic: Negative.   Neurological: Negative.  Negative for dizziness and weakness.  Hematological: Negative.  Negative for adenopathy. Does not  bruise/bleed easily.  Psychiatric/Behavioral: Negative.        Objective:   Physical Exam  Vitals reviewed. Constitutional: He is oriented to person, place, and time. He appears well-developed and well-nourished. No distress.  HENT:  Head: Normocephalic and atraumatic.  Mouth/Throat: Oropharynx is clear and moist. No oropharyngeal exudate.  Eyes: Conjunctivae are normal. Right eye exhibits no discharge. Left eye exhibits no discharge. No scleral icterus.  Neck: Normal range of motion. Neck supple. No JVD present. No tracheal deviation present. No thyromegaly present.  Cardiovascular: Normal rate, regular rhythm, normal heart sounds and intact distal pulses.  Exam reveals no gallop and no friction rub.   No murmur heard. Pulmonary/Chest: Effort normal and breath sounds normal. No stridor. No respiratory distress. He has no wheezes. He has no rales. He exhibits no tenderness.  Abdominal: Soft. Bowel sounds are normal. He exhibits no distension and no mass. There is no tenderness. There is no rebound and no guarding.  Musculoskeletal: Normal range of motion. He exhibits no edema and no tenderness.  Lymphadenopathy:    He has no cervical adenopathy.  Neurological: He is oriented to person, place, and time.  Skin: Skin is warm and dry. No rash noted. He is not diaphoretic. No erythema. No pallor.  Psychiatric: He has a normal mood and affect. His behavior is normal. Judgment and thought content normal.    Lab Results  Component Value Date   WBC 8.9 09/10/2011   HGB 13.8 09/10/2011   HCT 40.7 09/10/2011   PLT 237.0 09/10/2011   GLUCOSE 165* 02/07/2013   CHOL 132 08/05/2012   TRIG 169.0* 08/05/2012   HDL 28.90* 08/05/2012   LDLCALC 69 08/05/2012   ALT 29 09/15/2012   AST 20 09/15/2012  NA 139 02/07/2013   K 4.4 02/07/2013   CL 106 02/07/2013   CREATININE 0.9 02/07/2013   BUN 20 02/07/2013   CO2 28 02/07/2013   TSH 0.39 09/15/2012   PSA 0.37 09/15/2012   INR 1.9 04/27/2013   HGBA1C 7.0*  02/07/2013   MICROALBUR 1.0 09/15/2012        Assessment & Plan:

## 2013-06-10 NOTE — Assessment & Plan Note (Signed)
His blood sugar is well controlled His renal function is stable

## 2013-06-10 NOTE — Assessment & Plan Note (Signed)
His BP is well controlled Lytes and renal function are stable 

## 2013-06-12 ENCOUNTER — Encounter: Payer: Self-pay | Admitting: Internal Medicine

## 2013-06-15 ENCOUNTER — Ambulatory Visit (INDEPENDENT_AMBULATORY_CARE_PROVIDER_SITE_OTHER): Payer: BC Managed Care – PPO

## 2013-06-15 DIAGNOSIS — I4891 Unspecified atrial fibrillation: Secondary | ICD-10-CM

## 2013-06-15 DIAGNOSIS — Z7901 Long term (current) use of anticoagulants: Secondary | ICD-10-CM

## 2013-06-15 LAB — POCT INR: INR: 2.3

## 2013-07-26 ENCOUNTER — Ambulatory Visit (INDEPENDENT_AMBULATORY_CARE_PROVIDER_SITE_OTHER): Payer: BC Managed Care – PPO | Admitting: *Deleted

## 2013-07-26 DIAGNOSIS — I4891 Unspecified atrial fibrillation: Secondary | ICD-10-CM

## 2013-07-26 DIAGNOSIS — Z7901 Long term (current) use of anticoagulants: Secondary | ICD-10-CM

## 2013-07-26 DIAGNOSIS — Z5181 Encounter for therapeutic drug level monitoring: Secondary | ICD-10-CM

## 2013-07-26 LAB — POCT INR: INR: 2.7

## 2013-09-08 ENCOUNTER — Ambulatory Visit (INDEPENDENT_AMBULATORY_CARE_PROVIDER_SITE_OTHER): Payer: BC Managed Care – PPO | Admitting: Cardiology

## 2013-09-08 ENCOUNTER — Ambulatory Visit (INDEPENDENT_AMBULATORY_CARE_PROVIDER_SITE_OTHER): Payer: BC Managed Care – PPO | Admitting: *Deleted

## 2013-09-08 ENCOUNTER — Encounter (INDEPENDENT_AMBULATORY_CARE_PROVIDER_SITE_OTHER): Payer: Self-pay

## 2013-09-08 ENCOUNTER — Encounter: Payer: Self-pay | Admitting: Cardiology

## 2013-09-08 ENCOUNTER — Encounter: Payer: Self-pay | Admitting: Physician Assistant

## 2013-09-08 VITALS — BP 139/75 | HR 62 | Ht 70.0 in | Wt 278.1 lb

## 2013-09-08 DIAGNOSIS — I4891 Unspecified atrial fibrillation: Secondary | ICD-10-CM

## 2013-09-08 DIAGNOSIS — Z7901 Long term (current) use of anticoagulants: Secondary | ICD-10-CM

## 2013-09-08 DIAGNOSIS — Z5181 Encounter for therapeutic drug level monitoring: Secondary | ICD-10-CM

## 2013-09-08 LAB — POCT INR: INR: 1.8

## 2013-09-08 MED ORDER — WARFARIN SODIUM 10 MG PO TABS: ORAL_TABLET | ORAL | Status: DC

## 2013-09-08 MED ORDER — METOPROLOL SUCCINATE ER 100 MG PO TB24: 100.0000 mg | ORAL_TABLET | Freq: Every day | ORAL | Status: DC

## 2013-09-08 MED ORDER — AMLODIPINE BESYLATE 10 MG PO TABS: 10.0000 mg | ORAL_TABLET | Freq: Every day | ORAL | Status: DC

## 2013-09-08 MED ORDER — HYDROCHLOROTHIAZIDE 12.5 MG PO TABS: 12.5000 mg | ORAL_TABLET | Freq: Every day | ORAL | Status: DC

## 2013-09-08 MED ORDER — PRAVASTATIN SODIUM 40 MG PO TABS: 40.0000 mg | ORAL_TABLET | Freq: Every day | ORAL | Status: DC

## 2013-09-08 MED ORDER — BENAZEPRIL HCL 20 MG PO TABS: 20.0000 mg | ORAL_TABLET | Freq: Every day | ORAL | Status: DC

## 2013-09-08 MED ORDER — FLECAINIDE ACETATE 100 MG PO TABS: 100.0000 mg | ORAL_TABLET | Freq: Two times a day (BID) | ORAL | Status: DC

## 2013-09-08 NOTE — Assessment & Plan Note (Signed)
Patient recently turned 48. I have referred to gastroenterology for screening colonoscopy.

## 2013-09-08 NOTE — Patient Instructions (Signed)
Your physician wants you to follow-up in: 6 MONTHS WITH DR Jens SomRENSHAW You will receive a reminder letter in the mail two months in advance. If you don't receive a letter, please call our office to schedule the follow-up appointment.   Your physician recommends that you return for lab work TOMORROW- DO NOT EAT PRIOR TO LAB WORK  REFERRAL TO DR Stan HeadARL GESSNER FOR ROUTINE COLONOSCOPY

## 2013-09-08 NOTE — Assessment & Plan Note (Signed)
Continue statin. Check lipids and liver. 

## 2013-09-08 NOTE — Assessment & Plan Note (Signed)
Patient remains in sinus rhythm.Continue flecainide, beta blocker and Coumadin. Check hemoglobin.

## 2013-09-08 NOTE — Progress Notes (Signed)
HPI: FU atrial fibrillation. Cardiac catheterization in January of 2001 showed normal LV function and normal coronary arteries.Transesophageal echocardiogram in March of 2011 showed an ejection fraction of 50%. There was mild biatrial enlargement and mild mitral regurgitation. Nuclear study in June of 2011 showed soft tissue attenuation and no ischemia. Patient has been treated with flecainide for atrial fibrillation. He did have an exercise treadmill in September of 2011 and showed no exercise induced ventricular tachycardia. Patient last seen in August of 2014. Since that time, the patient denies any dyspnea on exertion, orthopnea, PND, pedal edema, palpitations, syncope or chest pain.   Current Outpatient Prescriptions  Medication Sig Dispense Refill  . amLODipine (NORVASC) 10 MG tablet Take 1 tablet (10 mg total) by mouth daily.  90 tablet  4  . benazepril (LOTENSIN) 20 MG tablet Take 1 tablet (20 mg total) by mouth daily.  90 tablet  4  . flecainide (TAMBOCOR) 100 MG tablet Take 1 tablet (100 mg total) by mouth 2 (two) times daily.  180 tablet  4  . fluticasone (FLONASE) 50 MCG/ACT nasal spray Place 2 sprays into the nose daily.  16 g  5  . hydrochlorothiazide (HYDRODIURIL) 12.5 MG tablet Take 1 tablet (12.5 mg total) by mouth daily.  90 tablet  4  . metFORMIN (GLUCOPHAGE-XR) 500 MG 24 hr tablet TAKE TWO TABLETS BY MOUTH EVERY DAY WITH BREAKFAST  180 tablet  2  . metoprolol succinate (TOPROL-XL) 100 MG 24 hr tablet Take 1 tablet (100 mg total) by mouth daily.  90 tablet  4  . Multiple Vitamin (MULTIVITAMIN) tablet Take 1 tablet by mouth daily.        . pravastatin (PRAVACHOL) 40 MG tablet Take 1 tablet (40 mg total) by mouth daily.  90 tablet  4  . sildenafil (VIAGRA) 50 MG tablet Take 1 tablet (50 mg total) by mouth daily as needed for erectile dysfunction.  10 tablet  11  . warfarin (COUMADIN) 10 MG tablet Take as directed by Anticoagulation clinic  120 tablet  0   No current  facility-administered medications for this visit.     Past Medical History  Diagnosis Date  . Hyperlipidemia   . OSA (obstructive sleep apnea)   . Hypertension   . DM II (diabetes mellitus, type II), controlled   . Paroxysmal atrial fibrillation   . Obesity   . Erectile dysfunction     Past Surgical History  Procedure Laterality Date  . Cardiac catheterization    . Finger surgery      left 1st finger    History   Social History  . Marital Status: Married    Spouse Name: N/A    Number of Children: N/A  . Years of Education: N/A   Occupational History  . Not on file.   Social History Main Topics  . Smoking status: Former Smoker    Quit date: 05/26/1994  . Smokeless tobacco: Never Used     Comment: quit in 1995  . Alcohol Use: 3.0 oz/week    5 Cans of beer per week  . Drug Use: No  . Sexual Activity: Yes   Other Topics Concern  . Not on file   Social History Narrative  . No narrative on file    ROS: no fevers or chills, productive cough, hemoptysis, dysphasia, odynophagia, melena, hematochezia, dysuria, hematuria, rash, seizure activity, orthopnea, PND, pedal edema, claudication. Remaining systems are negative.  Physical Exam: Well-developed well-nourished in no acute distress.  Skin is warm and dry.  HEENT is normal.  Neck is supple.  Chest is clear to auscultation with normal expansion.  Cardiovascular exam is regular rate and rhythm.  Abdominal exam nontender or distended. No masses palpated. Extremities show no edema. neuro grossly intact  ECG Sinus rhythm at a rate of 62. Probable early repolarization abnormality.

## 2013-09-08 NOTE — Assessment & Plan Note (Signed)
Blood pressure controlled. Continue present medications. Check potassium and renal function. 

## 2013-09-09 ENCOUNTER — Other Ambulatory Visit (INDEPENDENT_AMBULATORY_CARE_PROVIDER_SITE_OTHER): Payer: BC Managed Care – PPO

## 2013-09-09 DIAGNOSIS — I4891 Unspecified atrial fibrillation: Secondary | ICD-10-CM

## 2013-09-09 LAB — LIPID PANEL
Cholesterol: 116 mg/dL (ref 0–200)
HDL: 30.8 mg/dL — ABNORMAL LOW (ref >39.00)
LDL Cholesterol: 56 mg/dL (ref 0–99)
Total CHOL/HDL Ratio: 4
Triglycerides: 144 mg/dL (ref 0.0–149.0)
VLDL: 28.8 mg/dL (ref 0.0–40.0)

## 2013-09-09 LAB — CBC WITH DIFFERENTIAL/PLATELET
Basophils Absolute: 0 10*3/uL (ref 0.0–0.1)
Basophils Relative: 0.4 % (ref 0.0–3.0)
Eosinophils Absolute: 0.2 10*3/uL (ref 0.0–0.7)
Eosinophils Relative: 2 % (ref 0.0–5.0)
HCT: 40.9 % (ref 39.0–52.0)
Hemoglobin: 14 g/dL (ref 13.0–17.0)
Lymphocytes Relative: 22.1 % (ref 12.0–46.0)
Lymphs Abs: 2 10*3/uL (ref 0.7–4.0)
MCHC: 34.1 g/dL (ref 30.0–36.0)
MCV: 84.7 fl (ref 78.0–100.0)
Monocytes Absolute: 0.6 10*3/uL (ref 0.1–1.0)
Monocytes Relative: 7 % (ref 3.0–12.0)
Neutro Abs: 6.2 10*3/uL (ref 1.4–7.7)
Neutrophils Relative %: 68.5 % (ref 43.0–77.0)
Platelets: 227 10*3/uL (ref 150.0–400.0)
RBC: 4.83 Mil/uL (ref 4.22–5.81)
RDW: 13.5 % (ref 11.5–14.6)
WBC: 9 10*3/uL (ref 4.5–10.5)

## 2013-09-09 LAB — BASIC METABOLIC PANEL
BUN: 15 mg/dL (ref 6–23)
CO2: 24 mEq/L (ref 19–32)
Calcium: 9.1 mg/dL (ref 8.4–10.5)
Chloride: 106 mEq/L (ref 96–112)
Creatinine, Ser: 0.8 mg/dL (ref 0.4–1.5)
GFR: 129.69 mL/min (ref >60.00)
Glucose, Bld: 118 mg/dL — ABNORMAL HIGH (ref 70–99)
Potassium: 4 mEq/L (ref 3.5–5.1)
Sodium: 138 mEq/L (ref 135–145)

## 2013-09-09 LAB — HEPATIC FUNCTION PANEL
ALT: 25 U/L (ref 0–53)
AST: 17 U/L (ref 0–37)
Albumin: 3.7 g/dL (ref 3.5–5.2)
Alkaline Phosphatase: 80 U/L (ref 39–117)
Bilirubin, Direct: 0 mg/dL (ref 0.0–0.3)
Total Bilirubin: 0.9 mg/dL (ref 0.3–1.2)
Total Protein: 6.7 g/dL (ref 6.0–8.3)

## 2013-09-15 ENCOUNTER — Ambulatory Visit: Payer: BC Managed Care – PPO | Admitting: Physician Assistant

## 2013-09-27 ENCOUNTER — Encounter: Payer: Self-pay | Admitting: *Deleted

## 2013-10-19 ENCOUNTER — Ambulatory Visit (INDEPENDENT_AMBULATORY_CARE_PROVIDER_SITE_OTHER): Payer: BC Managed Care – PPO

## 2013-10-19 DIAGNOSIS — I4891 Unspecified atrial fibrillation: Secondary | ICD-10-CM

## 2013-10-19 DIAGNOSIS — Z5181 Encounter for therapeutic drug level monitoring: Secondary | ICD-10-CM

## 2013-10-19 DIAGNOSIS — Z7901 Long term (current) use of anticoagulants: Secondary | ICD-10-CM

## 2013-10-19 LAB — POCT INR: INR: 3.9

## 2013-11-16 ENCOUNTER — Ambulatory Visit (INDEPENDENT_AMBULATORY_CARE_PROVIDER_SITE_OTHER): Payer: BC Managed Care – PPO

## 2013-11-16 DIAGNOSIS — Z5181 Encounter for therapeutic drug level monitoring: Secondary | ICD-10-CM

## 2013-11-16 DIAGNOSIS — I4891 Unspecified atrial fibrillation: Secondary | ICD-10-CM

## 2013-11-16 DIAGNOSIS — Z7901 Long term (current) use of anticoagulants: Secondary | ICD-10-CM

## 2013-11-16 LAB — POCT INR: INR: 3.9

## 2013-12-14 ENCOUNTER — Ambulatory Visit (INDEPENDENT_AMBULATORY_CARE_PROVIDER_SITE_OTHER): Payer: BC Managed Care – PPO

## 2013-12-14 DIAGNOSIS — Z5181 Encounter for therapeutic drug level monitoring: Secondary | ICD-10-CM

## 2013-12-14 DIAGNOSIS — I4891 Unspecified atrial fibrillation: Secondary | ICD-10-CM

## 2013-12-14 DIAGNOSIS — Z7901 Long term (current) use of anticoagulants: Secondary | ICD-10-CM

## 2013-12-14 LAB — POCT INR: INR: 2.1

## 2014-01-25 ENCOUNTER — Ambulatory Visit (INDEPENDENT_AMBULATORY_CARE_PROVIDER_SITE_OTHER): Payer: BC Managed Care – PPO | Admitting: *Deleted

## 2014-01-25 DIAGNOSIS — I4891 Unspecified atrial fibrillation: Secondary | ICD-10-CM

## 2014-01-25 DIAGNOSIS — Z7901 Long term (current) use of anticoagulants: Secondary | ICD-10-CM

## 2014-01-25 DIAGNOSIS — Z5181 Encounter for therapeutic drug level monitoring: Secondary | ICD-10-CM

## 2014-01-25 LAB — POCT INR: INR: 3.2

## 2014-03-10 ENCOUNTER — Ambulatory Visit: Payer: BC Managed Care – PPO | Admitting: Cardiology

## 2014-03-12 NOTE — Progress Notes (Signed)
HPI: FU atrial fibrillation. Cardiac catheterization in January of 2001 showed normal LV function and normal coronary arteries. Transesophageal echocardiogram in March of 2011 showed an ejection fraction of 50%. There was mild biatrial enlargement and mild mitral regurgitation. Nuclear study in June of 2011 showed soft tissue attenuation and no ischemia. Patient has been treated with flecainide for atrial fibrillation. He did have an exercise treadmill in September of 2011 and showed no exercise induced ventricular tachycardia. Since last seen, He denies dyspnea, chest pain or syncope. He did develop recurrent palpitations on his way to the office today.   Current Outpatient Prescriptions  Medication Sig Dispense Refill  . amLODipine (NORVASC) 10 MG tablet Take 1 tablet (10 mg total) by mouth daily.  90 tablet  4  . benazepril (LOTENSIN) 20 MG tablet Take 1 tablet (20 mg total) by mouth daily.  90 tablet  4  . flecainide (TAMBOCOR) 100 MG tablet Take 1 tablet (100 mg total) by mouth 2 (two) times daily.  180 tablet  4  . fluticasone (FLONASE) 50 MCG/ACT nasal spray Place 2 sprays into the nose daily.  16 g  5  . hydrochlorothiazide (HYDRODIURIL) 12.5 MG tablet Take 1 tablet (12.5 mg total) by mouth daily.  90 tablet  4  . metFORMIN (GLUCOPHAGE-XR) 500 MG 24 hr tablet TAKE TWO TABLETS BY MOUTH EVERY DAY WITH BREAKFAST  180 tablet  2  . metoprolol succinate (TOPROL-XL) 100 MG 24 hr tablet Take 1 tablet (100 mg total) by mouth daily.  90 tablet  4  . Multiple Vitamin (MULTIVITAMIN) tablet Take 1 tablet by mouth daily.        . pravastatin (PRAVACHOL) 40 MG tablet Take 1 tablet (40 mg total) by mouth daily.  90 tablet  4  . sildenafil (VIAGRA) 50 MG tablet Take 1 tablet (50 mg total) by mouth daily as needed for erectile dysfunction.  10 tablet  11  . warfarin (COUMADIN) 10 MG tablet Take as directed by Anticoagulation clinic  90 tablet  1   No current facility-administered medications for  this visit.     Past Medical History  Diagnosis Date  . Hyperlipidemia   . OSA (obstructive sleep apnea)   . Hypertension   . DM II (diabetes mellitus, type II), controlled   . Paroxysmal atrial fibrillation   . Obesity   . Erectile dysfunction     Past Surgical History  Procedure Laterality Date  . Cardiac catheterization    . Finger surgery      left 1st finger    History   Social History  . Marital Status: Married    Spouse Name: N/A    Number of Children: N/A  . Years of Education: N/A   Occupational History  . Not on file.   Social History Main Topics  . Smoking status: Former Smoker    Quit date: 05/26/1994  . Smokeless tobacco: Never Used     Comment: quit in 1995  . Alcohol Use: 3.0 oz/week    5 Cans of beer per week  . Drug Use: No  . Sexual Activity: Yes   Other Topics Concern  . Not on file   Social History Narrative  . No narrative on file    ROS: no fevers or chills, productive cough, hemoptysis, dysphasia, odynophagia, melena, hematochezia, dysuria, hematuria, rash, seizure activity, orthopnea, PND, pedal edema, claudication. Remaining systems are negative.  Physical Exam: Well-developed well-nourished in no acute distress.  Skin is warm  and dry.  HEENT is normal.  Neck is supple.  Chest is clear to auscultation with normal expansion.  Cardiovascular exam is tachycardic and irregular Abdominal exam nontender or distended. No masses palpated. Extremities show no edema. neuro grossly intact  ECG Atrial fibrillation at a rate of 120. Nonspecific ST changes.

## 2014-03-13 ENCOUNTER — Encounter: Payer: Self-pay | Admitting: Cardiology

## 2014-03-13 ENCOUNTER — Encounter: Payer: Self-pay | Admitting: *Deleted

## 2014-03-13 ENCOUNTER — Ambulatory Visit (INDEPENDENT_AMBULATORY_CARE_PROVIDER_SITE_OTHER): Payer: BC Managed Care – PPO | Admitting: Pharmacist Clinician (PhC)/ Clinical Pharmacy Specialist

## 2014-03-13 ENCOUNTER — Ambulatory Visit (INDEPENDENT_AMBULATORY_CARE_PROVIDER_SITE_OTHER): Payer: BC Managed Care – PPO | Admitting: Cardiology

## 2014-03-13 VITALS — BP 128/78 | HR 120 | Ht 70.0 in | Wt 279.3 lb

## 2014-03-13 DIAGNOSIS — I480 Paroxysmal atrial fibrillation: Secondary | ICD-10-CM

## 2014-03-13 DIAGNOSIS — I4891 Unspecified atrial fibrillation: Secondary | ICD-10-CM

## 2014-03-13 DIAGNOSIS — Z5181 Encounter for therapeutic drug level monitoring: Secondary | ICD-10-CM

## 2014-03-13 DIAGNOSIS — I4819 Other persistent atrial fibrillation: Secondary | ICD-10-CM | POA: Insufficient documentation

## 2014-03-13 DIAGNOSIS — N529 Male erectile dysfunction, unspecified: Secondary | ICD-10-CM

## 2014-03-13 DIAGNOSIS — Z7901 Long term (current) use of anticoagulants: Secondary | ICD-10-CM

## 2014-03-13 LAB — POCT INR: INR: 1.8

## 2014-03-13 LAB — TSH: TSH: 0.563 u[IU]/mL (ref 0.350–4.500)

## 2014-03-13 MED ORDER — WARFARIN SODIUM 10 MG PO TABS: ORAL_TABLET | ORAL | Status: DC

## 2014-03-13 MED ORDER — HYDROCHLOROTHIAZIDE 12.5 MG PO TABS: 12.5000 mg | ORAL_TABLET | Freq: Every day | ORAL | Status: DC

## 2014-03-13 MED ORDER — BENAZEPRIL HCL 20 MG PO TABS: 20.0000 mg | ORAL_TABLET | Freq: Every day | ORAL | Status: DC

## 2014-03-13 MED ORDER — METOPROLOL SUCCINATE ER 50 MG PO TB24: 150.0000 mg | ORAL_TABLET | Freq: Every day | ORAL | Status: DC

## 2014-03-13 MED ORDER — SILDENAFIL CITRATE 50 MG PO TABS: 50.0000 mg | ORAL_TABLET | Freq: Every day | ORAL | Status: DC | PRN

## 2014-03-13 MED ORDER — AMLODIPINE BESYLATE 5 MG PO TABS: 5.0000 mg | ORAL_TABLET | Freq: Every day | ORAL | Status: DC

## 2014-03-13 MED ORDER — FLECAINIDE ACETATE 100 MG PO TABS: 100.0000 mg | ORAL_TABLET | Freq: Two times a day (BID) | ORAL | Status: DC

## 2014-03-13 MED ORDER — PRAVASTATIN SODIUM 40 MG PO TABS: 40.0000 mg | ORAL_TABLET | Freq: Every day | ORAL | Status: DC

## 2014-03-13 NOTE — Assessment & Plan Note (Signed)
Patient requests evaluation for screening colonoscopy given his 48 years of age. I will arrange.

## 2014-03-13 NOTE — Patient Instructions (Signed)
Your physician wants you to follow-up in: 6 MONTHS WITH DR Jens SomRENSHAW You will receive a reminder letter in the mail two months in advance. If you don't receive a letter, please call our office to schedule the follow-up appointment.   Your physician recommends that you HAVE LAB WORK TODAY  Your physician has requested that you have an echocardiogram. Echocardiography is a painless test that uses sound waves to create images of your heart. It provides your doctor with information about the size and shape of your heart and how well your heart's chambers and valves are working. This procedure takes approximately one hour. There are no restrictions for this procedure.   INCREASE METOPROLOL TO 150 MG ONCE DAILY= 3 OF THE 50 MG TABLETS ONCE DAILY  DECREASE AMLODIPINE TO 5 MG ONCE DAILY  REFERRAL TO GI FOR SCREENING COLONOSCOPY

## 2014-03-13 NOTE — Assessment & Plan Note (Signed)
Since we are increasing Toprol we will decrease Norvasc to 5 mg daily. Follow blood pressure and adjust regimen as needed.

## 2014-03-13 NOTE — Assessment & Plan Note (Signed)
Continue statin. 

## 2014-03-13 NOTE — Assessment & Plan Note (Addendum)
Patient developed recurrent atrial fibrillation on the way to the office today.His rate is elevated. Increase Toprol to 150 mg daily. Continue flecainide and Coumadin. He typically convert on his own. I have instructed him to call us in the next 24 hours if he does not convert. If his symptoms become more frequent in the future we could consider a different antiarrhythmic. Recheck TSH and echocardiogram.

## 2014-03-14 ENCOUNTER — Encounter: Payer: Self-pay | Admitting: Internal Medicine

## 2014-03-15 ENCOUNTER — Other Ambulatory Visit (INDEPENDENT_AMBULATORY_CARE_PROVIDER_SITE_OTHER): Payer: BC Managed Care – PPO

## 2014-03-15 ENCOUNTER — Encounter: Payer: Self-pay | Admitting: Internal Medicine

## 2014-03-15 ENCOUNTER — Ambulatory Visit (INDEPENDENT_AMBULATORY_CARE_PROVIDER_SITE_OTHER): Payer: BC Managed Care – PPO | Admitting: Internal Medicine

## 2014-03-15 VITALS — BP 118/72 | HR 62 | Temp 98.0°F | Resp 16 | Wt 276.0 lb

## 2014-03-15 DIAGNOSIS — G4730 Sleep apnea, unspecified: Secondary | ICD-10-CM

## 2014-03-15 DIAGNOSIS — I10 Essential (primary) hypertension: Secondary | ICD-10-CM

## 2014-03-15 DIAGNOSIS — IMO0002 Reserved for concepts with insufficient information to code with codable children: Secondary | ICD-10-CM

## 2014-03-15 DIAGNOSIS — E785 Hyperlipidemia, unspecified: Secondary | ICD-10-CM

## 2014-03-15 DIAGNOSIS — E119 Type 2 diabetes mellitus without complications: Secondary | ICD-10-CM

## 2014-03-15 DIAGNOSIS — E1165 Type 2 diabetes mellitus with hyperglycemia: Secondary | ICD-10-CM

## 2014-03-15 LAB — CBC WITH DIFFERENTIAL/PLATELET
Basophils Absolute: 0.1 10*3/uL (ref 0.0–0.1)
Basophils Relative: 0.5 % (ref 0.0–3.0)
Eosinophils Absolute: 0.3 10*3/uL (ref 0.0–0.7)
Eosinophils Relative: 2.5 % (ref 0.0–5.0)
HCT: 44.5 % (ref 39.0–52.0)
Hemoglobin: 14.9 g/dL (ref 13.0–17.0)
Lymphocytes Relative: 28.1 % (ref 12.0–46.0)
Lymphs Abs: 3.3 10*3/uL (ref 0.7–4.0)
MCHC: 33.4 g/dL (ref 30.0–36.0)
MCV: 84.5 fl (ref 78.0–100.0)
Monocytes Absolute: 1 10*3/uL (ref 0.1–1.0)
Monocytes Relative: 8.4 % (ref 3.0–12.0)
Neutro Abs: 7.1 10*3/uL (ref 1.4–7.7)
Neutrophils Relative %: 60.5 % (ref 43.0–77.0)
Platelets: 252 10*3/uL (ref 150.0–400.0)
RBC: 5.27 Mil/uL (ref 4.22–5.81)
RDW: 13.7 % (ref 11.5–15.5)
WBC: 11.7 10*3/uL — ABNORMAL HIGH (ref 4.0–10.5)

## 2014-03-15 LAB — HEMOGLOBIN A1C: Hgb A1c MFr Bld: 6.6 % — ABNORMAL HIGH (ref 4.6–6.5)

## 2014-03-15 MED ORDER — METFORMIN HCL ER 500 MG PO TB24: ORAL_TABLET | ORAL | Status: DC

## 2014-03-15 NOTE — Patient Instructions (Signed)

## 2014-03-15 NOTE — Progress Notes (Signed)
Subjective:    Patient ID: Daniel Grant, male    DOB: [birth year], 48 y.o.   MRN: [id number]  Diabetes He presents for his follow-up diabetic visit. He has type 2 diabetes mellitus. His disease course has been stable. There are no hypoglycemic associated symptoms. Pertinent negatives for hypoglycemia include no dizziness, headaches or tremors. There are no diabetic associated symptoms. Pertinent negatives for diabetes include no blurred vision, no chest pain, no fatigue, no foot paresthesias, no foot ulcerations, no polydipsia, no polyphagia, no polyuria, no visual change, no weakness and no weight loss. There are no hypoglycemic complications. Symptoms are stable. There are no diabetic complications. Current diabetic treatment includes oral agent (monotherapy). He is compliant with treatment most of the time. His weight is stable. He is following a generally healthy diet. Meal planning includes avoidance of concentrated sweets. He has not had a previous visit with a dietician. He participates in exercise intermittently. There is no change in his home blood glucose trend. An ACE inhibitor/angiotensin II receptor blocker is being taken. He does not see a podiatrist.Eye exam is not current.      Review of Systems  Constitutional: Negative.  Negative for fever, chills, weight loss, diaphoresis, appetite change and fatigue.  HENT: Negative.   Eyes: Negative.  Negative for blurred vision.  Respiratory: Negative.  Negative for apnea, cough, choking, chest tightness, shortness of breath, wheezing and stridor.   Cardiovascular: Negative.  Negative for chest pain and leg swelling.  Gastrointestinal: Negative.  Negative for nausea, vomiting, abdominal pain, diarrhea, constipation and blood in stool.  Endocrine: Negative.  Negative for polydipsia, polyphagia and polyuria.  Genitourinary: Negative.   Musculoskeletal: Negative.  Negative for arthralgias, back pain, joint swelling, myalgias and neck  stiffness.  Skin: Negative.  Negative for rash.  Allergic/Immunologic: Negative.   Neurological: Negative.  Negative for dizziness, tremors, weakness, light-headedness and headaches.  Hematological: Negative.  Negative for adenopathy. Does not bruise/bleed easily.  Psychiatric/Behavioral: Negative.        Objective:   Physical Exam  Vitals reviewed. Constitutional: He is oriented to person, place, and time. He appears well-developed and well-nourished. No distress.  HENT:  Head: Normocephalic and atraumatic.  Mouth/Throat: Oropharynx is clear and moist. No oropharyngeal exudate.  Eyes: Conjunctivae are normal. Right eye exhibits no discharge. Left eye exhibits no discharge. No scleral icterus.  Neck: Normal range of motion. Neck supple. No JVD present. No tracheal deviation present. No thyromegaly present.  Cardiovascular: Normal rate, regular rhythm, normal heart sounds and intact distal pulses.  Exam reveals no gallop and no friction rub.   No murmur heard. Pulmonary/Chest: Effort normal and breath sounds normal. No stridor. No respiratory distress. He has no wheezes. He has no rales. He exhibits no tenderness.  Abdominal: Soft. Bowel sounds are normal. He exhibits no distension and no mass. There is no tenderness. There is no rebound and no guarding.  Musculoskeletal: Normal range of motion. He exhibits no edema and no tenderness.  Lymphadenopathy:    He has no cervical adenopathy.  Neurological: He is oriented to person, place, and time.  Skin: Skin is warm and dry. No rash noted. He is not diaphoretic. No erythema. No pallor.     Lab Results  Component Value Date   WBC 9.0 09/09/2013   HGB 14.0 09/09/2013   HCT 40.9 09/09/2013   PLT 227.0 09/09/2013   GLUCOSE 118* 09/09/2013   CHOL 116 09/09/2013   TRIG 144.0 09/09/2013   HDL 30.80* 09/09/2013  LDLCALC 56 09/09/2013   ALT 25 09/09/2013   AST 17 09/09/2013   NA 138 09/09/2013   K 4.0 09/09/2013   CL 106 09/09/2013   CREATININE  0.8 09/09/2013   BUN 15 09/09/2013   CO2 24 09/09/2013   TSH 0.563 03/13/2014   PSA 0.37 09/15/2012   INR 1.8 03/13/2014   HGBA1C 7.1* 06/09/2013   MICROALBUR 1.0 09/15/2012       Assessment & Plan:

## 2014-03-15 NOTE — Progress Notes (Signed)
Pre visit review using our clinic review tool, if applicable. No additional management support is needed unless otherwise documented below in the visit note. 

## 2014-03-16 ENCOUNTER — Telehealth: Payer: Self-pay | Admitting: Internal Medicine

## 2014-03-16 ENCOUNTER — Encounter: Payer: Self-pay | Admitting: Internal Medicine

## 2014-03-16 LAB — COMPREHENSIVE METABOLIC PANEL
ALT: 28 U/L (ref 0–53)
AST: 22 U/L (ref 0–37)
Albumin: 3.5 g/dL (ref 3.5–5.2)
Alkaline Phosphatase: 89 U/L (ref 39–117)
BUN: 23 mg/dL (ref 6–23)
CO2: 27 mEq/L (ref 19–32)
Calcium: 8.9 mg/dL (ref 8.4–10.5)
Chloride: 105 mEq/L (ref 96–112)
Creatinine, Ser: 1 mg/dL (ref 0.4–1.5)
GFR: 99.19 mL/min (ref >60.00)
Glucose, Bld: 133 mg/dL — ABNORMAL HIGH (ref 70–99)
Potassium: 4 mEq/L (ref 3.5–5.1)
Sodium: 137 mEq/L (ref 135–145)
Total Bilirubin: 0.7 mg/dL (ref 0.2–1.2)
Total Protein: 7.2 g/dL (ref 6.0–8.3)

## 2014-03-16 LAB — LIPID PANEL
Cholesterol: 135 mg/dL (ref 0–200)
HDL: 32 mg/dL — ABNORMAL LOW (ref >39.00)
LDL Cholesterol: 66 mg/dL (ref 0–99)
NonHDL: 103
Total CHOL/HDL Ratio: 4
Triglycerides: 187 mg/dL — ABNORMAL HIGH (ref 0.0–149.0)
VLDL: 37.4 mg/dL (ref 0.0–40.0)

## 2014-03-16 LAB — TSH: TSH: 0.59 u[IU]/mL (ref 0.35–4.50)

## 2014-03-16 NOTE — Assessment & Plan Note (Signed)
He has achieved his LDL goal 

## 2014-03-16 NOTE — Assessment & Plan Note (Signed)
His BP is well controlled Lytes and renal function are stable 

## 2014-03-16 NOTE — Assessment & Plan Note (Signed)
His blood sugars are well controlled Renal function is stable 

## 2014-03-16 NOTE — Telephone Encounter (Signed)
emmi emailed °

## 2014-03-22 ENCOUNTER — Telehealth: Payer: Self-pay | Admitting: *Deleted

## 2014-03-22 NOTE — Telephone Encounter (Signed)
DC coumadin 5 days prior to procedure and resume day of procedure. Brian Crenshaw  

## 2014-03-22 NOTE — Telephone Encounter (Signed)
  03/22/2014   RE: Daniel Grant DOB: April 17, 1966 MRN: [id number]   Dear Dr Jens Somrenshaw,    We have scheduled the above patient for an Colonoscopy. Our records show that he is on anticoagulation therapy.   Please advise as to how long the patient may come off his therapy of Coumadin prior to the procedure, which is scheduled for 05-05-2014.  Please route the completed form to Landry DykeKelly S., CMA   Sincerely,    Ok AnisKelly Hulon Ferron

## 2014-03-30 ENCOUNTER — Ambulatory Visit (HOSPITAL_COMMUNITY)
Admission: RE | Admit: 2014-03-30 | Discharge: 2014-03-30 | Disposition: A | Discharge status: Home / Self Care | Payer: BC Managed Care – PPO | Source: Ambulatory Visit | Attending: Cardiology | Admitting: Cardiology

## 2014-03-30 ENCOUNTER — Ambulatory Visit (HOSPITAL_COMMUNITY): Payer: BC Managed Care – PPO

## 2014-03-30 DIAGNOSIS — E785 Hyperlipidemia, unspecified: Secondary | ICD-10-CM | POA: Insufficient documentation

## 2014-03-30 DIAGNOSIS — I4891 Unspecified atrial fibrillation: Secondary | ICD-10-CM

## 2014-03-30 DIAGNOSIS — E119 Type 2 diabetes mellitus without complications: Secondary | ICD-10-CM | POA: Insufficient documentation

## 2014-03-30 DIAGNOSIS — N529 Male erectile dysfunction, unspecified: Secondary | ICD-10-CM

## 2014-03-30 DIAGNOSIS — I480 Paroxysmal atrial fibrillation: Secondary | ICD-10-CM

## 2014-03-30 DIAGNOSIS — I517 Cardiomegaly: Secondary | ICD-10-CM

## 2014-03-30 NOTE — Progress Notes (Signed)
2D Echo Performed 03/30/2014    Sukanya Goldblatt, RCS  

## 2014-03-31 ENCOUNTER — Encounter: Payer: Self-pay | Admitting: Nurse Practitioner

## 2014-03-31 ENCOUNTER — Telehealth: Payer: Self-pay | Admitting: *Deleted

## 2014-03-31 ENCOUNTER — Ambulatory Visit (INDEPENDENT_AMBULATORY_CARE_PROVIDER_SITE_OTHER): Payer: BC Managed Care – PPO | Admitting: Nurse Practitioner

## 2014-03-31 VITALS — BP 110/80 | HR 80 | Ht 71.0 in | Wt 279.4 lb

## 2014-03-31 DIAGNOSIS — Z1211 Encounter for screening for malignant neoplasm of colon: Secondary | ICD-10-CM

## 2014-03-31 MED ORDER — MOVIPREP 100 G PO SOLR: 1.0000 | Freq: Once | ORAL | Status: DC

## 2014-03-31 NOTE — Telephone Encounter (Signed)
03/31/2014   RE: Daniel FusiRodney L Grant DOB: 02-27-1966 MRN: [id number]   Dear Dr. Jens Somrenshaw,    We have scheduled the above patient for an Colonoscopy. Our records show that he is on anticoagulation therapy.   Please advise as to how long the patient may come off his therapy of Coumadin prior to the procedure, which is scheduled for 05-05-2014.  Please route the completed form to Landry DykeKelly S., CMA  Sincerely,    [name]

## 2014-03-31 NOTE — Telephone Encounter (Signed)
Patient called back I advised patient to hold Coumadin 5 days prior to procedure Patient verbalized understanding

## 2014-03-31 NOTE — Telephone Encounter (Signed)
Charna Elizabethheryl Johnson D Pugh, LPN at 16/1/096011/10/2013 11:56 AM     Status: Signed       Expand All Collapse All   Received call back from patient Dr.Crenshaw advised ok to hold coumadin 5 days prior to procedure and resume day of.Message sent to [facility] LL[facility] Ruairi Stutsman CMA.

## 2014-03-31 NOTE — Telephone Encounter (Signed)
Patient called no answer.LMTC. 

## 2014-03-31 NOTE — Telephone Encounter (Signed)
Received call back from patient Dr.Crenshaw advised ok to hold coumadin 5 days prior to procedure and resume day of.Message sent to [facility] Smith CMA.

## 2014-03-31 NOTE — Patient Instructions (Signed)
You have been scheduled for a colonoscopy. Please follow written instructions given to you at your visit today.  Please pick up your prep kit at the pharmacy within the next 1-3 days. If you use inhalers (even only as needed), please bring them with you on the day of your procedure. Your physician has requested that you go to www.startemmi.com and enter the access code given to you at your visit today. This web site gives a general overview about your procedure. However, you should still follow specific instructions given to you by our office regarding your preparation for the procedure.  You will be contaced by our office prior to your procedure for directions on holding your Coumadin/Warfarin.  If you do not hear from our office 1 week prior to your scheduled procedure, please call [phone] to discuss. 

## 2014-03-31 NOTE — Telephone Encounter (Signed)
DC coumadin 5 days prior to procedure and resume day of Daniel Grant  

## 2014-03-31 NOTE — Telephone Encounter (Signed)
Left message with male that answered the phone for patient to call the office back

## 2014-04-03 ENCOUNTER — Ambulatory Visit (HOSPITAL_COMMUNITY): Payer: BC Managed Care – PPO

## 2014-04-03 ENCOUNTER — Encounter: Payer: Self-pay | Admitting: Nurse Practitioner

## 2014-04-03 DIAGNOSIS — Z1211 Encounter for screening for malignant neoplasm of colon: Secondary | ICD-10-CM | POA: Insufficient documentation

## 2014-04-03 NOTE — Progress Notes (Addendum)
HPI :   Patient is a 48 year old male who is scheduled for a screening colonoscopy with Dr. Hilarie Fredrickson in December. Because patient is on anticoagulant he needed to be seen preprocedure. Patient has a history of hypertension and atrial fibrillation. He is on multiple antihypertensive agents. He takes Coumadin as well. Patient has no gastrointestinal complaints. Specifically, he has no bowel changes, blood in stool, abdominal pain, nausea or unexpected weight loss. No Ty Ty of colon cancer  Patient has a history of obstructive sleep apnea, he requires a CPAP at home. He will still lose weight, inquires if it would help hypertension and diabetes.  Past Medical History  Diagnosis Date  . Hyperlipidemia   . OSA (obstructive sleep apnea)   . Hypertension   . DM II (diabetes mellitus, type II), controlled   . Paroxysmal atrial fibrillation   . Obesity   . Erectile dysfunction     Family History  Problem Relation Age of Onset  . Heart attack Paternal Uncle   . Heart attack Maternal Grandmother   . Heart disease Mother   . Hypertension Mother   . Diabetes Mother   . Alcohol abuse Father   . Cancer Neg Hx   . Early death Neg Hx   . Hyperlipidemia Neg Hx   . Kidney disease Neg Hx   . Stroke Neg Hx    History  Substance Use Topics  . Smoking status: Former Smoker    Quit date: 05/26/1994  . Smokeless tobacco: Never Used     Comment: quit in 1995  . Alcohol Use: 3.0 oz/week    5 Cans of beer per week   Current Outpatient Prescriptions  Medication Sig Dispense Refill  . amLODipine (NORVASC) 5 MG tablet Take 1 tablet (5 mg total) by mouth daily. 90 tablet 4  . benazepril (LOTENSIN) 20 MG tablet Take 1 tablet (20 mg total) by mouth daily. 90 tablet 4  . flecainide (TAMBOCOR) 100 MG tablet Take 1 tablet (100 mg total) by mouth 2 (two) times daily. 180 tablet 4  . fluticasone (FLONASE) 50 MCG/ACT nasal spray Place 2 sprays into the nose daily. 16 g 5  . hydrochlorothiazide (HYDRODIURIL)  12.5 MG tablet Take 1 tablet (12.5 mg total) by mouth daily. 90 tablet 4  . metFORMIN (GLUCOPHAGE-XR) 500 MG 24 hr tablet TAKE TWO TABLETS BY MOUTH EVERY DAY WITH BREAKFAST 180 tablet 1  . metoprolol succinate (TOPROL-XL) 50 MG 24 hr tablet Take 3 tablets (150 mg total) by mouth daily. 270 tablet 3  . Multiple Vitamin (MULTIVITAMIN) tablet Take 1 tablet by mouth daily.      . pravastatin (PRAVACHOL) 40 MG tablet Take 1 tablet (40 mg total) by mouth daily. 90 tablet 4  . sildenafil (VIAGRA) 50 MG tablet Take 1 tablet (50 mg total) by mouth daily as needed for erectile dysfunction. 10 tablet 11  . warfarin (COUMADIN) 10 MG tablet Take as directed by Anticoagulation clinic 90 tablet 1  . MOVIPREP 100 G SOLR Take 1 kit (200 g total) by mouth once. 1 kit 0   No current facility-administered medications for this visit.   No Known Allergies   Review of Systems: All systems reviewed and negative except where noted in HPI.    No results found.  Physical Exam: BP 110/80 mmHg  Pulse 80  Ht $R'5\' 11"'Yn$  (1.803 m)  Wt 279 lb 6.4 oz (126.735 kg)  BMI 38.99 kg/m2 Constitutional: Pleasant, obese black male in no acute distress. HEENT: Normocephalic and  atraumatic. Conjunctivae are normal. No scleral icterus. Neck supple.  Cardiovascular: Normal rate, regular rhythm.  Pulmonary/chest: Effort normal and breath sounds normal. No wheezing, rales or rhonchi. Abdominal: Soft, nondistended, nontender. Bowel sounds active throughout. There are no masses palpable. No hepatomegaly. Extremities: no edema Lymphadenopathy: No cervical adenopathy noted. Neurological: Alert and oriented to person place and time. Skin: Skin is warm and dry. No rashes noted. Psychiatric: Normal mood and affect. Behavior is normal.   ASSESSMENT AND PLAN:  43. 48 year old male for colon cancer screening. The risks, benefits, and alternatives to colonoscopy with possible biopsy and possible polypectomy were discussed with the patient  and he consents to proceed.   2. Hypertension / diabetes / atrial fibrillation, patient is on chronic Coumadin. EF 55-60% on echo a few days ago. Regarding coumadin, we have already received clearance from cardiology to hold coumadin prior to colonoscopy  Addendum: Reviewed and agree with initial management. Jerene Bears, MD

## 2014-04-07 ENCOUNTER — Encounter: Payer: Self-pay | Admitting: Internal Medicine

## 2014-04-12 ENCOUNTER — Ambulatory Visit (INDEPENDENT_AMBULATORY_CARE_PROVIDER_SITE_OTHER): Payer: BC Managed Care – PPO

## 2014-04-12 DIAGNOSIS — I4891 Unspecified atrial fibrillation: Secondary | ICD-10-CM

## 2014-04-12 DIAGNOSIS — Z7901 Long term (current) use of anticoagulants: Secondary | ICD-10-CM

## 2014-04-12 LAB — POCT INR: INR: 2.1

## 2014-04-24 ENCOUNTER — Encounter: Payer: BC Managed Care – PPO | Admitting: Internal Medicine

## 2014-05-05 ENCOUNTER — Ambulatory Visit (AMBULATORY_SURGERY_CENTER): Payer: BC Managed Care – PPO | Admitting: Internal Medicine

## 2014-05-05 ENCOUNTER — Encounter: Payer: Self-pay | Admitting: Internal Medicine

## 2014-05-05 VITALS — BP 106/70 | HR 65 | Temp 97.5°F | Resp 17 | Ht 71.0 in | Wt 279.0 lb

## 2014-05-05 DIAGNOSIS — Z1211 Encounter for screening for malignant neoplasm of colon: Secondary | ICD-10-CM

## 2014-05-05 LAB — GLUCOSE, CAPILLARY
Glucose-Capillary: 85 mg/dL (ref 70–99)
Glucose-Capillary: 96 mg/dL (ref 70–99)

## 2014-05-05 MED ORDER — SODIUM CHLORIDE 0.9 % IV SOLN: 500.0000 mL | INTRAVENOUS | Status: DC

## 2014-05-05 MED ORDER — DEXTROSE 5 % IV SOLN: INTRAVENOUS | Status: DC

## 2014-05-05 NOTE — Progress Notes (Signed)
Pt stable to RR HOB elevated

## 2014-05-05 NOTE — Op Note (Signed)
[facility] [address] [place], [id number]   COLONOSCOPY PROCEDURE REPORT  PATIENT: Daniel FusiLeath, [name]  MR#: [id number] BIRTHDATE: 11-26-1965 , 48  yrs. old GENDER: male ENDOSCOPIST: Beverley FiedlerJay M Nakaiya Beddow, MD REFERRED XB:JYNWGNBY:Thomas Karsten RoL Jones, M.D. PROCEDURE DATE:  05/05/2014 PROCEDURE:   Colonoscopy, screening First Screening Colonoscopy - Avg.  risk and is 50 yrs.  old or older Yes.  Prior Negative Screening - Now for repeat screening. N/A  History of Adenoma - Now for follow-up colonoscopy & has been > or = to 3 yrs.  N/A  Polyps Removed Today? Yes. ASA CLASS:   Class III INDICATIONS:average risk for colon cancer and first colonoscopy. MEDICATIONS: Propofol 230 mg IV, lidocaine 40 mg IV and Monitored anesthesia care  DESCRIPTION OF PROCEDURE:   After the risks benefits and alternatives of the procedure were thoroughly explained, informed consent was obtained.  The digital rectal exam revealed no abnormalities of the rectum.   The LB [id number]  endoscope was introduced through the anus and advanced to the cecum, which was identified by both the appendix and ileocecal valve. No adverse events experienced.   The quality of the prep was good, using MoviPrep  The instrument was then slowly withdrawn as the colon was fully examined.      COLON FINDINGS: A normal appearing cecum, ileocecal valve, and appendiceal orifice were identified.  The ascending, transverse, descending, sigmoid colon, and rectum appeared unremarkable. Retroflexed views revealed no abnormalities. The time to cecum=2 minutes 00 seconds.  Withdrawal time=10 minutes 19 seconds.  The scope was withdrawn and the procedure completed.  COMPLICATIONS: There were no immediate complications.  ENDOSCOPIC IMPRESSION: Normal colonoscopy  RECOMMENDATIONS: You should continue to follow colorectal cancer screening guidelines for "routine risk" patients with a repeat colonoscopy in 10 years. There is no need  for FOBT (stool) testing for at least 5 years.  eSigned:  Beverley FiedlerJay M Toshio Slusher, MD 05/05/2014 3:28 PM   cc: The Patient and Etta Grandchildhomas L Jones, MD

## 2014-05-05 NOTE — Patient Instructions (Signed)
YOU HAD AN ENDOSCOPIC PROCEDURE TODAY AT THE [facility]: Refer to the procedure report that was given to you for any specific questions about what was found during the examination.  If the procedure report does not answer your questions, please call your gastroenterologist to clarify.  If you requested that your care partner not be given the details of your procedure findings, then the procedure report has been included in a sealed envelope for you to review at your convenience later.  YOU SHOULD EXPECT: Some feelings of bloating in the abdomen. Passage of more gas than usual.  Walking can help get rid of the air that was put into your GI tract during the procedure and reduce the bloating. If you had a lower endoscopy (such as a colonoscopy or flexible sigmoidoscopy) you may notice spotting of blood in your stool or on the toilet paper. If you underwent a bowel prep for your procedure, then you may not have a normal bowel movement for a few days.  DIET: Your first meal following the procedure should be a light meal and then it is ok to progress to your normal diet.  A half-sandwich or bowl of soup is an example of a good first meal.  Heavy or fried foods are harder to digest and may make you feel nauseous or bloated.  Likewise meals heavy in dairy and vegetables can cause extra gas to form and this can also increase the bloating.  Drink plenty of fluids but you should avoid alcoholic beverages for 24 hours.  ACTIVITY: Your care partner should take you home directly after the procedure.  You should plan to take it easy, moving slowly for the rest of the day.  You can resume normal activity the day after the procedure however you should NOT DRIVE or use heavy machinery for 24 hours (because of the sedation medicines used during the test).    SYMPTOMS TO REPORT IMMEDIATELY: A gastroenterologist can be reached at any hour.  During normal business hours, 8:30 AM to 5:00 PM Monday through Friday,  call [phone].  After hours and on weekends, please call the GI answering service at [phone] who will take a message and have the physician on call contact you.   Following lower endoscopy (colonoscopy or flexible sigmoidoscopy):  Excessive amounts of blood in the stool  Significant tenderness or worsening of abdominal pains  Swelling of the abdomen that is new, acute  Fever of 100F or higher    FOLLOW UP: If any biopsies were taken you will be contacted by phone or by letter within the next 1-3 weeks.  Call your gastroenterologist if you have not heard about the biopsies in 3 weeks.  Our staff will call the home number listed on your records the next business day following your procedure to check on you and address any questions or concerns that you may have at that time regarding the information given to you following your procedure. This is a courtesy call and so if there is no answer at the home number and we have not heard from you through the emergency physician on call, we will assume that you have returned to your regular daily activities without incident.  SIGNATURES/CONFIDENTIALITY: You and/or your care partner have signed paperwork which will be entered into your electronic medical record.  These signatures attest to the fact that that the information above on your After Visit Summary has been reviewed and is understood.  Full responsibility of the confidentiality   of this discharge information lies with you and/or your care-partner.  Normal colonoscopy-Repeat in 10 years-2025. 

## 2014-05-08 ENCOUNTER — Telehealth: Payer: Self-pay

## 2014-05-08 NOTE — Telephone Encounter (Signed)
  Follow up Call-  Call back number 05/05/2014  Post procedure Call Back phone  # [phone]  Permission to leave phone message Yes     Patient questions:  Do you have a fever, pain , or abdominal swelling? No. Pain Score  0 *  Have you tolerated food without any problems? Yes.    Have you been able to return to your normal activities? Yes.    Do you have any questions about your discharge instructions: Diet   No. Medications  No. Follow up visit  No.  Do you have questions or concerns about your Care? No.  Actions: * If pain score is 4 or above: No action needed, pain <4.

## 2014-05-15 ENCOUNTER — Ambulatory Visit (INDEPENDENT_AMBULATORY_CARE_PROVIDER_SITE_OTHER): Payer: BC Managed Care – PPO | Admitting: *Deleted

## 2014-05-15 DIAGNOSIS — Z7901 Long term (current) use of anticoagulants: Secondary | ICD-10-CM

## 2014-05-15 DIAGNOSIS — I4891 Unspecified atrial fibrillation: Secondary | ICD-10-CM

## 2014-05-15 LAB — POCT INR: INR: 1.9

## 2014-05-16 LAB — HM DIABETES EYE EXAM

## 2014-05-31 ENCOUNTER — Ambulatory Visit: Payer: BC Managed Care – PPO

## 2014-06-14 ENCOUNTER — Ambulatory Visit (INDEPENDENT_AMBULATORY_CARE_PROVIDER_SITE_OTHER): Payer: BLUE CROSS/BLUE SHIELD | Admitting: *Deleted

## 2014-06-14 DIAGNOSIS — I4891 Unspecified atrial fibrillation: Secondary | ICD-10-CM

## 2014-06-14 DIAGNOSIS — Z7901 Long term (current) use of anticoagulants: Secondary | ICD-10-CM

## 2014-06-14 LAB — POCT INR: INR: 2.7

## 2014-07-26 ENCOUNTER — Ambulatory Visit (INDEPENDENT_AMBULATORY_CARE_PROVIDER_SITE_OTHER): Payer: BLUE CROSS/BLUE SHIELD | Admitting: Pharmacist

## 2014-07-26 DIAGNOSIS — I4891 Unspecified atrial fibrillation: Secondary | ICD-10-CM

## 2014-07-26 LAB — POCT INR: INR: 1.6

## 2014-08-16 ENCOUNTER — Ambulatory Visit (INDEPENDENT_AMBULATORY_CARE_PROVIDER_SITE_OTHER): Payer: BLUE CROSS/BLUE SHIELD | Admitting: Pharmacist

## 2014-08-16 DIAGNOSIS — Z7901 Long term (current) use of anticoagulants: Secondary | ICD-10-CM | POA: Diagnosis not present

## 2014-08-16 DIAGNOSIS — I4891 Unspecified atrial fibrillation: Secondary | ICD-10-CM | POA: Diagnosis not present

## 2014-08-16 LAB — POCT INR: INR: 2.4

## 2014-09-15 ENCOUNTER — Ambulatory Visit: Payer: BC Managed Care – PPO | Admitting: Internal Medicine

## 2014-09-18 ENCOUNTER — Encounter: Payer: Self-pay | Admitting: Internal Medicine

## 2014-09-18 ENCOUNTER — Ambulatory Visit (INDEPENDENT_AMBULATORY_CARE_PROVIDER_SITE_OTHER): Payer: BLUE CROSS/BLUE SHIELD | Admitting: Internal Medicine

## 2014-09-18 ENCOUNTER — Other Ambulatory Visit (INDEPENDENT_AMBULATORY_CARE_PROVIDER_SITE_OTHER): Payer: BLUE CROSS/BLUE SHIELD

## 2014-09-18 VITALS — BP 98/64 | HR 77 | Temp 98.5°F | Resp 16 | Wt 277.0 lb

## 2014-09-18 DIAGNOSIS — E119 Type 2 diabetes mellitus without complications: Secondary | ICD-10-CM

## 2014-09-18 DIAGNOSIS — E785 Hyperlipidemia, unspecified: Secondary | ICD-10-CM

## 2014-09-18 DIAGNOSIS — I480 Paroxysmal atrial fibrillation: Secondary | ICD-10-CM

## 2014-09-18 DIAGNOSIS — E1165 Type 2 diabetes mellitus with hyperglycemia: Secondary | ICD-10-CM

## 2014-09-18 DIAGNOSIS — I481 Persistent atrial fibrillation: Secondary | ICD-10-CM

## 2014-09-18 DIAGNOSIS — I10 Essential (primary) hypertension: Secondary | ICD-10-CM

## 2014-09-18 DIAGNOSIS — IMO0002 Reserved for concepts with insufficient information to code with codable children: Secondary | ICD-10-CM

## 2014-09-18 DIAGNOSIS — N529 Male erectile dysfunction, unspecified: Secondary | ICD-10-CM

## 2014-09-18 DIAGNOSIS — I4819 Other persistent atrial fibrillation: Secondary | ICD-10-CM

## 2014-09-18 LAB — CBC WITH DIFFERENTIAL/PLATELET
Basophils Absolute: 0.1 10*3/uL (ref 0.0–0.1)
Basophils Relative: 0.4 % (ref 0.0–3.0)
Eosinophils Absolute: 0.3 10*3/uL (ref 0.0–0.7)
Eosinophils Relative: 2.6 % (ref 0.0–5.0)
HCT: 45 % (ref 39.0–52.0)
Hemoglobin: 15.2 g/dL (ref 13.0–17.0)
Lymphocytes Relative: 23.8 % (ref 12.0–46.0)
Lymphs Abs: 2.9 10*3/uL (ref 0.7–4.0)
MCHC: 33.8 g/dL (ref 30.0–36.0)
MCV: 83.6 fl (ref 78.0–100.0)
Monocytes Absolute: 0.8 10*3/uL (ref 0.1–1.0)
Monocytes Relative: 6.5 % (ref 3.0–12.0)
Neutro Abs: 8.1 10*3/uL — ABNORMAL HIGH (ref 1.4–7.7)
Neutrophils Relative %: 66.7 % (ref 43.0–77.0)
Platelets: 254 10*3/uL (ref 150.0–400.0)
RBC: 5.39 Mil/uL (ref 4.22–5.81)
RDW: 13.1 % (ref 11.5–15.5)
WBC: 12.2 10*3/uL — ABNORMAL HIGH (ref 4.0–10.5)

## 2014-09-18 LAB — COMPREHENSIVE METABOLIC PANEL
ALT: 23 U/L (ref 0–53)
AST: 17 U/L (ref 0–37)
Albumin: 4.2 g/dL (ref 3.5–5.2)
Alkaline Phosphatase: 105 U/L (ref 39–117)
BUN: 25 mg/dL — ABNORMAL HIGH (ref 6–23)
CO2: 24 mEq/L (ref 19–32)
Calcium: 9.3 mg/dL (ref 8.4–10.5)
Chloride: 105 mEq/L (ref 96–112)
Creatinine, Ser: 1.17 mg/dL (ref 0.40–1.50)
GFR: 84.5 mL/min (ref >60.00)
Glucose, Bld: 108 mg/dL — ABNORMAL HIGH (ref 70–99)
Potassium: 4 mEq/L (ref 3.5–5.1)
Sodium: 137 mEq/L (ref 135–145)
Total Bilirubin: 0.5 mg/dL (ref 0.2–1.2)
Total Protein: 7.1 g/dL (ref 6.0–8.3)

## 2014-09-18 LAB — LIPID PANEL
Cholesterol: 142 mg/dL (ref 0–200)
HDL: 31.7 mg/dL — ABNORMAL LOW (ref >39.00)
NonHDL: 110.3
Total CHOL/HDL Ratio: 4
Triglycerides: 255 mg/dL — ABNORMAL HIGH (ref 0.0–149.0)
VLDL: 51 mg/dL — ABNORMAL HIGH (ref 0.0–40.0)

## 2014-09-18 LAB — HEMOGLOBIN A1C: Hgb A1c MFr Bld: 7.1 % — ABNORMAL HIGH (ref 4.6–6.5)

## 2014-09-18 LAB — LDL CHOLESTEROL, DIRECT: Direct LDL: 80 mg/dL

## 2014-09-18 MED ORDER — METFORMIN HCL ER 500 MG PO TB24: ORAL_TABLET | ORAL | Status: DC

## 2014-09-18 MED ORDER — SILDENAFIL CITRATE 50 MG PO TABS: 50.0000 mg | ORAL_TABLET | Freq: Every day | ORAL | Status: DC | PRN

## 2014-09-18 MED ORDER — FLUTICASONE PROPIONATE 50 MCG/ACT NA SUSP: 2.0000 | Freq: Every day | NASAL | Status: DC

## 2014-09-18 NOTE — Progress Notes (Signed)
Subjective:    Patient ID: Daniel Grant, male    DOB: 03-03-1966, 49 y.o.   MRN: [id number]  Diabetes He presents for his follow-up diabetic visit. He has type 2 diabetes mellitus. His disease course has been stable. There are no hypoglycemic associated symptoms. Pertinent negatives for hypoglycemia include no dizziness. Pertinent negatives for diabetes include no blurred vision, no chest pain, no fatigue, no foot paresthesias, no foot ulcerations, no polydipsia, no polyphagia, no polyuria, no visual change, no weakness and no weight loss. There are no hypoglycemic complications. Diabetic complications include heart disease. Current diabetic treatment includes oral agent (monotherapy). He is compliant with treatment all of the time. He is following a generally healthy diet. Meal planning includes avoidance of concentrated sweets. He participates in exercise intermittently. There is no change in his home blood glucose trend. An ACE inhibitor/angiotensin II receptor blocker is being taken. He does not see a podiatrist.Eye exam is not current.      Review of Systems  Constitutional: Negative.  Negative for fever, chills, weight loss, diaphoresis, appetite change and fatigue.  HENT: Negative.   Eyes: Negative.  Negative for blurred vision.  Respiratory: Positive for apnea. Negative for cough, choking, chest tightness, shortness of breath and stridor.   Cardiovascular: Negative.  Negative for chest pain, palpitations and leg swelling.  Gastrointestinal: Negative.  Negative for nausea, vomiting, abdominal pain, diarrhea and constipation.  Endocrine: Negative.  Negative for polydipsia, polyphagia and polyuria.  Genitourinary: Negative.   Musculoskeletal: Negative.  Negative for myalgias, back pain, joint swelling and arthralgias.  Skin: Negative.   Allergic/Immunologic: Negative.   Neurological: Negative.  Negative for dizziness, syncope, weakness, light-headedness and numbness.  Hematological:  Negative.  Negative for adenopathy. Does not bruise/bleed easily.  Psychiatric/Behavioral: Negative.        Objective:   Physical Exam  Constitutional: He is oriented to person, place, and time. He appears well-developed and well-nourished. No distress.  HENT:  Head: Normocephalic and atraumatic.  Mouth/Throat: Oropharynx is clear and moist. No oropharyngeal exudate.  Eyes: Conjunctivae are normal. Right eye exhibits no discharge. Left eye exhibits no discharge. No scleral icterus.  Neck: Normal range of motion. Neck supple. No JVD present. No tracheal deviation present. No thyromegaly present.  Cardiovascular: Normal rate, normal heart sounds and intact distal pulses.  An irregularly irregular rhythm present. Exam reveals no gallop and no friction rub.   No murmur heard. Pulmonary/Chest: Effort normal and breath sounds normal. No stridor. No respiratory distress. He has no wheezes. He has no rales. He exhibits no tenderness.  Abdominal: Soft. Bowel sounds are normal. He exhibits no distension and no mass. There is no tenderness. There is no rebound and no guarding.  Musculoskeletal: Normal range of motion. He exhibits no edema or tenderness.  Lymphadenopathy:    He has no cervical adenopathy.  Neurological: He is oriented to person, place, and time.  Skin: Skin is warm and dry. No rash noted. He is not diaphoretic. No erythema. No pallor.  Psychiatric: He has a normal mood and affect. His behavior is normal. Judgment and thought content normal.  Vitals reviewed.    Lab Results  Component Value Date   WBC 11.7* 03/15/2014   HGB 14.9 03/15/2014   HCT 44.5 03/15/2014   PLT 252.0 03/15/2014   GLUCOSE 133* 03/15/2014   CHOL 135 03/15/2014   TRIG 187.0* 03/15/2014   HDL 32.00* 03/15/2014   LDLCALC 66 03/15/2014   ALT 28 03/15/2014   AST 22 03/15/2014  NA 137 03/15/2014   K 4.0 03/15/2014   CL 105 03/15/2014   CREATININE 1.0 03/15/2014   BUN 23 03/15/2014   CO2 27 03/15/2014     TSH 0.59 03/15/2014   PSA 0.37 09/15/2012   INR 2.4 08/16/2014   HGBA1C 6.6* 03/15/2014   MICROALBUR 1.0 09/15/2012       Assessment & Plan:

## 2014-09-18 NOTE — Patient Instructions (Signed)

## 2014-09-18 NOTE — Progress Notes (Signed)
Pre visit review using our clinic review tool, if applicable. No additional management support is needed unless otherwise documented below in the visit note. 

## 2014-09-19 ENCOUNTER — Encounter: Payer: Self-pay | Admitting: Internal Medicine

## 2014-09-19 LAB — TSH: TSH: 0.88 u[IU]/mL (ref 0.35–4.50)

## 2014-09-19 NOTE — Assessment & Plan Note (Signed)
His blood sugars have been well controlled to date Will recheck his A1C today and will monitor his renal function

## 2014-09-19 NOTE — Assessment & Plan Note (Signed)
His BP is very well controlled - he feels well on the current regimen Will monitor his lytes and renal function

## 2014-09-26 ENCOUNTER — Ambulatory Visit (INDEPENDENT_AMBULATORY_CARE_PROVIDER_SITE_OTHER): Payer: Self-pay | Admitting: *Deleted

## 2014-09-26 DIAGNOSIS — Z7901 Long term (current) use of anticoagulants: Secondary | ICD-10-CM

## 2014-09-26 DIAGNOSIS — I4891 Unspecified atrial fibrillation: Secondary | ICD-10-CM

## 2014-09-26 LAB — POCT INR: INR: 2.3

## 2014-11-07 ENCOUNTER — Ambulatory Visit (INDEPENDENT_AMBULATORY_CARE_PROVIDER_SITE_OTHER): Payer: 59

## 2014-11-07 DIAGNOSIS — I4891 Unspecified atrial fibrillation: Secondary | ICD-10-CM

## 2014-11-07 DIAGNOSIS — Z7901 Long term (current) use of anticoagulants: Secondary | ICD-10-CM

## 2014-11-07 LAB — POCT INR: INR: 2.8

## 2014-12-04 ENCOUNTER — Other Ambulatory Visit: Payer: Self-pay | Admitting: Cardiology

## 2014-12-04 DIAGNOSIS — N529 Male erectile dysfunction, unspecified: Secondary | ICD-10-CM

## 2014-12-04 DIAGNOSIS — I480 Paroxysmal atrial fibrillation: Secondary | ICD-10-CM

## 2014-12-04 MED ORDER — METOPROLOL SUCCINATE ER 50 MG PO TB24: 150.0000 mg | ORAL_TABLET | Freq: Every day | ORAL | Status: DC

## 2014-12-04 MED ORDER — AMLODIPINE BESYLATE 5 MG PO TABS: 5.0000 mg | ORAL_TABLET | Freq: Every day | ORAL | Status: DC

## 2014-12-04 MED ORDER — FLECAINIDE ACETATE 100 MG PO TABS: 100.0000 mg | ORAL_TABLET | Freq: Two times a day (BID) | ORAL | Status: DC

## 2014-12-04 MED ORDER — WARFARIN SODIUM 10 MG PO TABS
ORAL_TABLET | ORAL | Status: DC
Start: 2014-12-04 — End: 2015-07-26

## 2014-12-04 MED ORDER — BENAZEPRIL HCL 20 MG PO TABS: 20.0000 mg | ORAL_TABLET | Freq: Every day | ORAL | Status: DC

## 2014-12-04 MED ORDER — HYDROCHLOROTHIAZIDE 12.5 MG PO TABS: 12.5000 mg | ORAL_TABLET | Freq: Every day | ORAL | Status: DC

## 2014-12-04 MED ORDER — PRAVASTATIN SODIUM 40 MG PO TABS: 40.0000 mg | ORAL_TABLET | Freq: Every day | ORAL | Status: DC

## 2014-12-04 NOTE — Telephone Encounter (Signed)
[facility] He needs 90 days supply and refills.

## 2014-12-04 NOTE — Telephone Encounter (Signed)
90 day rx sent to [facility]

## 2014-12-19 ENCOUNTER — Ambulatory Visit (INDEPENDENT_AMBULATORY_CARE_PROVIDER_SITE_OTHER): Payer: 59 | Admitting: *Deleted

## 2014-12-19 DIAGNOSIS — Z7901 Long term (current) use of anticoagulants: Secondary | ICD-10-CM | POA: Diagnosis not present

## 2014-12-19 DIAGNOSIS — I4891 Unspecified atrial fibrillation: Secondary | ICD-10-CM | POA: Diagnosis not present

## 2014-12-19 LAB — POCT INR: INR: 2.7

## 2015-01-27 ENCOUNTER — Other Ambulatory Visit: Payer: Self-pay | Admitting: Cardiology

## 2015-01-30 ENCOUNTER — Ambulatory Visit (INDEPENDENT_AMBULATORY_CARE_PROVIDER_SITE_OTHER): Payer: 59 | Admitting: *Deleted

## 2015-01-30 DIAGNOSIS — Z7901 Long term (current) use of anticoagulants: Secondary | ICD-10-CM

## 2015-01-30 DIAGNOSIS — I4891 Unspecified atrial fibrillation: Secondary | ICD-10-CM

## 2015-01-30 LAB — POCT INR: INR: 4.3

## 2015-02-13 ENCOUNTER — Ambulatory Visit (INDEPENDENT_AMBULATORY_CARE_PROVIDER_SITE_OTHER): Payer: 59

## 2015-02-13 DIAGNOSIS — Z7901 Long term (current) use of anticoagulants: Secondary | ICD-10-CM | POA: Diagnosis not present

## 2015-02-13 DIAGNOSIS — I4891 Unspecified atrial fibrillation: Secondary | ICD-10-CM

## 2015-02-13 LAB — POCT INR: INR: 2.8

## 2015-03-12 ENCOUNTER — Ambulatory Visit (INDEPENDENT_AMBULATORY_CARE_PROVIDER_SITE_OTHER): Payer: 59 | Admitting: *Deleted

## 2015-03-12 DIAGNOSIS — I4891 Unspecified atrial fibrillation: Secondary | ICD-10-CM

## 2015-03-12 DIAGNOSIS — Z7901 Long term (current) use of anticoagulants: Secondary | ICD-10-CM

## 2015-03-12 LAB — POCT INR: INR: 3.1

## 2015-04-23 ENCOUNTER — Ambulatory Visit (INDEPENDENT_AMBULATORY_CARE_PROVIDER_SITE_OTHER): Payer: 59 | Admitting: Pharmacist

## 2015-04-23 DIAGNOSIS — I4891 Unspecified atrial fibrillation: Secondary | ICD-10-CM

## 2015-04-23 DIAGNOSIS — Z7901 Long term (current) use of anticoagulants: Secondary | ICD-10-CM | POA: Diagnosis not present

## 2015-04-23 LAB — POCT INR: INR: 2.8

## 2015-04-26 NOTE — Progress Notes (Signed)
      HPI: FU atrial fibrillation. Cardiac catheterization in January of 2001 showed normal LV function and normal coronary arteries. Nuclear study in June of 2011 showed soft tissue attenuation and no ischemia. Patient has been treated with flecainide for atrial fibrillation. He did have an exercise treadmill in September of 2011 and showed no exercise induced ventricular tachycardia. Last echocardiogram November 2015 showed normal LV function, trace mitral and tricuspid regurgitation. Since last seen,   Current Outpatient Prescriptions  Medication Sig Dispense Refill  . amLODipine (NORVASC) 5 MG tablet Take 1 tablet by mouth  daily 90 tablet 0  . benazepril (LOTENSIN) 20 MG tablet Take 1 tablet by mouth  daily 90 tablet 0  . flecainide (TAMBOCOR) 100 MG tablet Take 1 tablet by mouth two  times daily 180 tablet 0  . fluticasone (FLONASE) 50 MCG/ACT nasal spray Place 2 sprays into both nostrils daily. 16 g 5  . hydrochlorothiazide (HYDRODIURIL) 12.5 MG tablet Take 1 tablet by mouth  daily 90 tablet 0  . metFORMIN (GLUCOPHAGE-XR) 500 MG 24 hr tablet TAKE TWO TABLETS BY MOUTH EVERY DAY WITH BREAKFAST 180 tablet 1  . metoprolol succinate (TOPROL-XL) 50 MG 24 hr tablet Take 3 tablets by mouth  daily 270 tablet 0  . Multiple Vitamin (MULTIVITAMIN) tablet Take 1 tablet by mouth daily.      . pravastatin (PRAVACHOL) 40 MG tablet Take 1 tablet by mouth  daily 90 tablet 0  . sildenafil (VIAGRA) 50 MG tablet Take 1 tablet (50 mg total) by mouth daily as needed for erectile dysfunction. 10 tablet 11  . warfarin (COUMADIN) 10 MG tablet Take as directed by Anticoagulation clinic 90 tablet 0   No current facility-administered medications for this visit.     Past Medical History  Diagnosis Date  . Hyperlipidemia   . OSA (obstructive sleep apnea)   . Hypertension   . DM II (diabetes mellitus, type II), controlled   . Paroxysmal atrial fibrillation   . Obesity   . Erectile dysfunction     Past  Surgical History  Procedure Laterality Date  . Cardiac catheterization    . Finger surgery      left 1st finger    Social History   Social History  . Marital Status: Married    Spouse Name: N/A  . Number of Children: N/A  . Years of Education: N/A   Occupational History  . Not on file.   Social History Main Topics  . Smoking status: Former Smoker    Quit date: 05/26/1994  . Smokeless tobacco: Never Used     Comment: quit in 1995  . Alcohol Use: 3.0 oz/week    5 Cans of beer per week  . Drug Use: No  . Sexual Activity: Yes   Other Topics Concern  . Not on file   Social History Narrative    ROS: no fevers or chills, productive cough, hemoptysis, dysphasia, odynophagia, melena, hematochezia, dysuria, hematuria, rash, seizure activity, orthopnea, PND, pedal edema, claudication. Remaining systems are negative.  Physical Exam: Well-developed well-nourished in no acute distress.  Skin is warm and dry.  HEENT is normal.  Neck is supple.  Chest is clear to auscultation with normal expansion.  Cardiovascular exam is regular rate and rhythm.  Abdominal exam nontender or distended. No masses palpated. Extremities show no edema. neuro grossly intact  ECG     This encounter was created in error - please disregard.

## 2015-04-30 ENCOUNTER — Ambulatory Visit (INDEPENDENT_AMBULATORY_CARE_PROVIDER_SITE_OTHER): Payer: 59 | Admitting: Cardiology

## 2015-04-30 ENCOUNTER — Encounter: Payer: Self-pay | Admitting: Cardiology

## 2015-04-30 ENCOUNTER — Encounter: Payer: 59 | Admitting: Cardiology

## 2015-04-30 VITALS — BP 110/60 | HR 71 | Ht 70.0 in | Wt 285.7 lb

## 2015-04-30 DIAGNOSIS — I481 Persistent atrial fibrillation: Secondary | ICD-10-CM | POA: Diagnosis not present

## 2015-04-30 DIAGNOSIS — E785 Hyperlipidemia, unspecified: Secondary | ICD-10-CM | POA: Diagnosis not present

## 2015-04-30 DIAGNOSIS — I10 Essential (primary) hypertension: Secondary | ICD-10-CM | POA: Diagnosis not present

## 2015-04-30 DIAGNOSIS — I4819 Other persistent atrial fibrillation: Secondary | ICD-10-CM

## 2015-04-30 DIAGNOSIS — I480 Paroxysmal atrial fibrillation: Secondary | ICD-10-CM | POA: Diagnosis not present

## 2015-04-30 NOTE — Assessment & Plan Note (Signed)
Blood pressure controlled. Continue present medications. 

## 2015-04-30 NOTE — Assessment & Plan Note (Signed)
Continue statin. 

## 2015-04-30 NOTE — Assessment & Plan Note (Signed)
Patient remains in sinus rhythm. Continue flecainide, Coumadin and beta blockade. I have given the patient the names of DOACs. He will contact his insurance company and if covered we will change from Coumadin.

## 2015-04-30 NOTE — Patient Instructions (Signed)
Your physician wants you to follow-up in: ONE YEAR WITH DR CRENSHAW You will receive a reminder letter in the mail two months in advance. If you don't receive a letter, please call our office to schedule the follow-up appointment.   If you need a refill on your cardiac medications before your next appointment, please call your pharmacy.  

## 2015-04-30 NOTE — Progress Notes (Signed)
HPI: FU atrial fibrillation. Cardiac catheterization in January of 2001 showed normal LV function and normal coronary arteries. Nuclear study in June of 2011 showed soft tissue attenuation and no ischemia. Patient has been treated with flecainide for atrial fibrillation. He did have an exercise treadmill in September of 2011 and showed no exercise induced ventricular tachycardia. Last echocardiogram November 2015 showed normal LV function, trace mitral and tricuspid regurgitation. Since last seen, the patient has dyspnea with more extreme activities but not with routine activities. It is relieved with rest. It is not associated with chest pain. There is no orthopnea, PND or pedal edema. There is no syncope or palpitations. There is no exertional chest pain.   Current Outpatient Prescriptions  Medication Sig Dispense Refill  . amLODipine (NORVASC) 5 MG tablet Take 1 tablet by mouth  daily 90 tablet 0  . benazepril (LOTENSIN) 20 MG tablet Take 1 tablet by mouth  daily 90 tablet 0  . flecainide (TAMBOCOR) 100 MG tablet Take 1 tablet by mouth two  times daily 180 tablet 0  . fluticasone (FLONASE) 50 MCG/ACT nasal spray Place 2 sprays into both nostrils daily. 16 g 5  . hydrochlorothiazide (HYDRODIURIL) 12.5 MG tablet Take 1 tablet by mouth  daily 90 tablet 0  . metFORMIN (GLUCOPHAGE-XR) 500 MG 24 hr tablet TAKE TWO TABLETS BY MOUTH EVERY DAY WITH BREAKFAST 180 tablet 1  . metoprolol succinate (TOPROL-XL) 50 MG 24 hr tablet Take 3 tablets by mouth  daily 270 tablet 0  . Multiple Vitamin (MULTIVITAMIN) tablet Take 1 tablet by mouth daily.      . pravastatin (PRAVACHOL) 40 MG tablet Take 1 tablet by mouth  daily 90 tablet 0  . sildenafil (VIAGRA) 50 MG tablet Take 1 tablet (50 mg total) by mouth daily as needed for erectile dysfunction. 10 tablet 11  . warfarin (COUMADIN) 10 MG tablet Take as directed by Anticoagulation clinic 90 tablet 0   No current facility-administered medications for this  visit.     Past Medical History  Diagnosis Date  . Hyperlipidemia   . OSA (obstructive sleep apnea)   . Hypertension   . DM II (diabetes mellitus, type II), controlled (HCC)   . Paroxysmal atrial fibrillation (HCC)   . Obesity   . Erectile dysfunction     Past Surgical History  Procedure Laterality Date  . Cardiac catheterization    . Finger surgery      left 1st finger    Social History   Social History  . Marital Status: Married    Spouse Name: N/A  . Number of Children: N/A  . Years of Education: N/A   Occupational History  . Not on file.   Social History Main Topics  . Smoking status: Former Smoker    Quit date: 05/26/1994  . Smokeless tobacco: Never Used     Comment: quit in 1995  . Alcohol Use: 3.0 oz/week    5 Cans of beer per week  . Drug Use: No  . Sexual Activity: Yes   Other Topics Concern  . Not on file   Social History Narrative    ROS: no fevers or chills, productive cough, hemoptysis, dysphasia, odynophagia, melena, hematochezia, dysuria, hematuria, rash, seizure activity, orthopnea, PND, pedal edema, claudication. Remaining systems are negative.  Physical Exam: Well-developed well-nourished in no acute distress.  Skin is warm and dry.  HEENT is normal.  Neck is supple.  Chest is clear to auscultation with normal expansion.  Cardiovascular  exam is regular rate and rhythm.  Abdominal exam nontender or distended. No masses palpated. Extremities show no edema. neuro grossly intact  ECG Sinus rhythm at a rate of 71. Early repolarization abnormality.

## 2015-05-01 ENCOUNTER — Other Ambulatory Visit: Payer: Self-pay | Admitting: Cardiology

## 2015-05-01 NOTE — Telephone Encounter (Signed)
Rx request sent to pharmacy.  

## 2015-05-26 ENCOUNTER — Other Ambulatory Visit: Payer: Self-pay | Admitting: Internal Medicine

## 2015-05-29 LAB — HM DIABETES EYE EXAM

## 2015-05-30 LAB — HM DIABETES EYE EXAM

## 2015-05-31 ENCOUNTER — Telehealth: Payer: Self-pay | Admitting: Internal Medicine

## 2015-05-31 ENCOUNTER — Other Ambulatory Visit: Payer: Self-pay | Admitting: Internal Medicine

## 2015-05-31 NOTE — Telephone Encounter (Signed)
I have pt scheduled to see you at 12:30 on Wed 1/11 thanks

## 2015-05-31 NOTE — Telephone Encounter (Signed)
His appointment is at 1 with Dr. Yetta BarreJones.  He is scheduled the day before with coumadin [address]  If I schedule with you, will pt need to [facility] the [facility] [address]

## 2015-05-31 NOTE — Telephone Encounter (Signed)
Pt [facility]in [facility] [address] He is coming in to see Dr. Yetta BarreJones on Wednesday and pts wife is wondering if he could have his coumadin done here instead on the same day Please advise

## 2015-06-01 ENCOUNTER — Other Ambulatory Visit: Payer: Self-pay

## 2015-06-01 ENCOUNTER — Telehealth: Payer: Self-pay

## 2015-06-01 DIAGNOSIS — I480 Paroxysmal atrial fibrillation: Secondary | ICD-10-CM

## 2015-06-01 DIAGNOSIS — N529 Male erectile dysfunction, unspecified: Secondary | ICD-10-CM

## 2015-06-01 MED ORDER — SILDENAFIL CITRATE 50 MG PO TABS: 50.0000 mg | ORAL_TABLET | Freq: Every day | ORAL | Status: DC | PRN

## 2015-06-01 NOTE — Telephone Encounter (Signed)
rf rq from [facility] for Viagra.

## 2015-06-01 NOTE — Telephone Encounter (Signed)
Done

## 2015-06-06 ENCOUNTER — Ambulatory Visit (INDEPENDENT_AMBULATORY_CARE_PROVIDER_SITE_OTHER): Payer: 59 | Admitting: General Practice

## 2015-06-06 ENCOUNTER — Other Ambulatory Visit: Payer: Self-pay | Admitting: Internal Medicine

## 2015-06-06 ENCOUNTER — Encounter: Payer: Self-pay | Admitting: Internal Medicine

## 2015-06-06 ENCOUNTER — Ambulatory Visit (INDEPENDENT_AMBULATORY_CARE_PROVIDER_SITE_OTHER): Payer: 59 | Admitting: Internal Medicine

## 2015-06-06 ENCOUNTER — Other Ambulatory Visit (INDEPENDENT_AMBULATORY_CARE_PROVIDER_SITE_OTHER): Payer: 59

## 2015-06-06 VITALS — BP 110/70 | HR 61 | Temp 98.2°F | Resp 16 | Ht 70.0 in | Wt 281.0 lb

## 2015-06-06 DIAGNOSIS — Z23 Encounter for immunization: Secondary | ICD-10-CM | POA: Diagnosis not present

## 2015-06-06 DIAGNOSIS — E785 Hyperlipidemia, unspecified: Secondary | ICD-10-CM

## 2015-06-06 DIAGNOSIS — I10 Essential (primary) hypertension: Secondary | ICD-10-CM | POA: Diagnosis not present

## 2015-06-06 DIAGNOSIS — E119 Type 2 diabetes mellitus without complications: Secondary | ICD-10-CM

## 2015-06-06 DIAGNOSIS — E781 Pure hyperglyceridemia: Secondary | ICD-10-CM | POA: Diagnosis not present

## 2015-06-06 DIAGNOSIS — N522 Drug-induced erectile dysfunction: Secondary | ICD-10-CM

## 2015-06-06 LAB — TSH: TSH: 0.94 u[IU]/mL (ref 0.35–4.50)

## 2015-06-06 LAB — URINALYSIS, ROUTINE W REFLEX MICROSCOPIC
Bilirubin Urine: NEGATIVE
Hgb urine dipstick: NEGATIVE
Ketones, ur: NEGATIVE
Leukocytes, UA: NEGATIVE
Nitrite: NEGATIVE
RBC / HPF: NONE SEEN (ref 0–?)
Specific Gravity, Urine: >=1.03 — AB (ref 1.000–1.030)
Total Protein, Urine: NEGATIVE
Urine Glucose: >=1000 — AB
Urobilinogen, UA: 0.2 (ref 0.0–1.0)
pH: 5.5 (ref 5.0–8.0)

## 2015-06-06 LAB — CBC WITH DIFFERENTIAL/PLATELET
Basophils Absolute: 0.1 10*3/uL (ref 0.0–0.1)
Basophils Relative: 1 % (ref 0.0–3.0)
Eosinophils Absolute: 0.2 10*3/uL (ref 0.0–0.7)
Eosinophils Relative: 1.6 % (ref 0.0–5.0)
HCT: 49.1 % (ref 39.0–52.0)
Hemoglobin: 16.5 g/dL (ref 13.0–17.0)
Lymphocytes Relative: 26.7 % (ref 12.0–46.0)
Lymphs Abs: 3.7 10*3/uL (ref 0.7–4.0)
MCHC: 33.5 g/dL (ref 30.0–36.0)
MCV: 84.5 fl (ref 78.0–100.0)
Monocytes Absolute: 1 10*3/uL (ref 0.1–1.0)
Monocytes Relative: 7.2 % (ref 3.0–12.0)
Neutro Abs: 8.8 10*3/uL — ABNORMAL HIGH (ref 1.4–7.7)
Neutrophils Relative %: 63.5 % (ref 43.0–77.0)
Platelets: 286 10*3/uL (ref 150.0–400.0)
RBC: 5.81 Mil/uL (ref 4.22–5.81)
RDW: 12.9 % (ref 11.5–15.5)
WBC: 13.9 10*3/uL — ABNORMAL HIGH (ref 4.0–10.5)

## 2015-06-06 LAB — MICROALBUMIN / CREATININE URINE RATIO
Creatinine,U: 188.3 mg/dL
Microalb Creat Ratio: 4.1 mg/g (ref 0.0–30.0)
Microalb, Ur: 7.8 mg/dL — ABNORMAL HIGH (ref 0.0–1.9)

## 2015-06-06 LAB — LIPID PANEL
Cholesterol: 159 mg/dL (ref 0–200)
HDL: 38.1 mg/dL — ABNORMAL LOW (ref >39.00)
NonHDL: 121.09
Total CHOL/HDL Ratio: 4
Triglycerides: 296 mg/dL — ABNORMAL HIGH (ref 0.0–149.0)
VLDL: 59.2 mg/dL — ABNORMAL HIGH (ref 0.0–40.0)

## 2015-06-06 LAB — COMPREHENSIVE METABOLIC PANEL
ALT: 27 U/L (ref 0–53)
AST: 17 U/L (ref 0–37)
Albumin: 4.4 g/dL (ref 3.5–5.2)
Alkaline Phosphatase: 101 U/L (ref 39–117)
BUN: 25 mg/dL — ABNORMAL HIGH (ref 6–23)
CO2: 26 mEq/L (ref 19–32)
Calcium: 10.2 mg/dL (ref 8.4–10.5)
Chloride: 102 mEq/L (ref 96–112)
Creatinine, Ser: 1 mg/dL (ref 0.40–1.50)
GFR: 101 mL/min (ref >60.00)
Glucose, Bld: 200 mg/dL — ABNORMAL HIGH (ref 70–99)
Potassium: 4.3 mEq/L (ref 3.5–5.1)
Sodium: 138 mEq/L (ref 135–145)
Total Bilirubin: 0.6 mg/dL (ref 0.2–1.2)
Total Protein: 7.1 g/dL (ref 6.0–8.3)

## 2015-06-06 LAB — LDL CHOLESTEROL, DIRECT: Direct LDL: 85 mg/dL

## 2015-06-06 LAB — POCT INR: INR: 1.9

## 2015-06-06 LAB — HEMOGLOBIN A1C: Hgb A1c MFr Bld: 8.6 % — ABNORMAL HIGH (ref 4.6–6.5)

## 2015-06-06 MED ORDER — METFORMIN HCL ER (MOD) 1000 MG PO TB24: 1000.0000 mg | ORAL_TABLET | Freq: Two times a day (BID) | ORAL | Status: DC

## 2015-06-06 MED ORDER — METFORMIN HCL ER 500 MG PO TB24: ORAL_TABLET | ORAL | Status: DC

## 2015-06-06 MED ORDER — ICOSAPENT ETHYL 1 G PO CAPS: 2.0000 | ORAL_CAPSULE | Freq: Two times a day (BID) | ORAL | Status: DC

## 2015-06-06 MED ORDER — ALOGLIPTIN-METFORMIN HCL 12.5-1000 MG PO TABS: 1.0000 | ORAL_TABLET | Freq: Two times a day (BID) | ORAL | Status: DC

## 2015-06-06 MED ORDER — SILDENAFIL CITRATE 100 MG PO TABS: 100.0000 mg | ORAL_TABLET | Freq: Every day | ORAL | Status: DC | PRN

## 2015-06-06 NOTE — Progress Notes (Signed)
Subjective:  Patient ID: Daniel Grant, male    DOB: April 29, 1966  Age: 50 y.o. MRN: [id number]  CC: Hyperlipidemia; Hypertension; and Diabetes   HPI [name] presents for follow-up on diabetes, hypertension and hyperlipidemia. He complains of mild polydipsia. He offers no other complaints.  Outpatient Prescriptions Prior to Visit  Medication Sig Dispense Refill  . amLODipine (NORVASC) 5 MG tablet Take 1 tablet by mouth  daily 90 tablet 3  . benazepril (LOTENSIN) 20 MG tablet Take 1 tablet by mouth  daily 90 tablet 3  . flecainide (TAMBOCOR) 100 MG tablet Take 1 tablet by mouth two  times daily 180 tablet 3  . fluticasone (FLONASE) 50 MCG/ACT nasal spray Place 2 sprays into both nostrils daily. 16 g 5  . hydrochlorothiazide (HYDRODIURIL) 12.5 MG tablet Take 1 tablet by mouth  daily 90 tablet 3  . metoprolol succinate (TOPROL-XL) 50 MG 24 hr tablet Take 3 tablets by mouth  daily 270 tablet 3  . Multiple Vitamin (MULTIVITAMIN) tablet Take 1 tablet by mouth daily.      . pravastatin (PRAVACHOL) 40 MG tablet Take 1 tablet by mouth  daily 90 tablet 3  . warfarin (COUMADIN) 10 MG tablet Take as directed by Anticoagulation clinic 90 tablet 0  . metFORMIN (GLUCOPHAGE-XR) 500 MG 24 hr tablet TAKE TWO TABLETS BY MOUTH EVERY DAY WITH BREAKFAST 180 tablet 1  . sildenafil (VIAGRA) 100 MG tablet Take 1 tablet (100 mg total) by mouth daily as needed for erectile dysfunction. 6 tablet 3  . sildenafil (VIAGRA) 50 MG tablet Take 1 tablet (50 mg total) by mouth daily as needed for erectile dysfunction. 10 tablet 11   No facility-administered medications prior to visit.    ROS Review of Systems  Constitutional: Negative.  Negative for fever, chills, diaphoresis, appetite change and fatigue.  HENT: Negative.  Negative for trouble swallowing.   Eyes: Negative.   Respiratory: Negative.  Negative for cough, choking, chest tightness, shortness of breath and stridor.   Cardiovascular: Negative.   Negative for chest pain, palpitations and leg swelling.  Gastrointestinal: Negative.  Negative for nausea, vomiting, abdominal pain, diarrhea and constipation.  Endocrine: Positive for polydipsia. Negative for cold intolerance, heat intolerance, polyphagia and polyuria.  Genitourinary: Negative.  Negative for difficulty urinating.  Musculoskeletal: Negative.  Negative for myalgias, back pain, joint swelling and arthralgias.  Skin: Negative.  Negative for color change and rash.  Allergic/Immunologic: Negative.   Neurological: Negative.  Negative for dizziness, tremors, weakness, light-headedness and numbness.  Hematological: Negative.  Negative for adenopathy. Does not bruise/bleed easily.  Psychiatric/Behavioral: Negative.     Objective:  BP 110/70 mmHg  Pulse 61  Temp(Src) 98.2 F (36.8 C) (Oral)  Resp 16  Ht 5\' 10"  (1.778 m)  Wt 281 lb (127.461 kg)  BMI 40.32 kg/m2  SpO2 97%  BP Readings from Last 3 Encounters:  06/06/15 110/70  04/30/15 110/60  09/18/14 98/64    Wt Readings from Last 3 Encounters:  06/06/15 281 lb (127.461 kg)  04/30/15 285 lb 11.2 oz (129.593 kg)  09/18/14 277 lb (125.646 kg)    Physical Exam  Constitutional: He is oriented to person, place, and time. No distress.  HENT:  Head: Normocephalic and atraumatic.  Mouth/Throat: Oropharynx is clear and moist. No oropharyngeal exudate.  Eyes: Conjunctivae are normal. Right eye exhibits no discharge. Left eye exhibits no discharge. No scleral icterus.  Neck: Normal range of motion. Neck supple. No JVD present. No tracheal deviation present. No thyromegaly  present.  Cardiovascular: Normal rate, normal heart sounds and intact distal pulses.  An irregularly irregular rhythm present. Exam reveals no gallop and no friction rub.   No murmur heard. Pulmonary/Chest: Effort normal and breath sounds normal. No stridor. No respiratory distress. He has no wheezes. He has no rales. He exhibits no tenderness.  Abdominal:  Soft. Bowel sounds are normal. He exhibits no distension and no mass. There is no tenderness. There is no rebound and no guarding.  Musculoskeletal: Normal range of motion. He exhibits no edema or tenderness.  Lymphadenopathy:    He has no cervical adenopathy.  Neurological: He is oriented to person, place, and time.  Skin: Skin is warm and dry. No rash noted. He is not diaphoretic. No erythema. No pallor.  Vitals reviewed.   Lab Results  Component Value Date   WBC 13.9* 06/06/2015   HGB 16.5 06/06/2015   HCT 49.1 06/06/2015   PLT 286.0 06/06/2015   GLUCOSE 200* 06/06/2015   CHOL 159 06/06/2015   TRIG 296.0* 06/06/2015   HDL 38.10* 06/06/2015   LDLDIRECT 85.0 06/06/2015   LDLCALC 66 03/15/2014   ALT 27 06/06/2015   AST 17 06/06/2015   NA 138 06/06/2015   K 4.3 06/06/2015   CL 102 06/06/2015   CREATININE 1.00 06/06/2015   BUN 25* 06/06/2015   CO2 26 06/06/2015   TSH 0.94 06/06/2015   PSA 0.37 09/15/2012   INR 1.9 06/06/2015   HGBA1C 8.6* 06/06/2015   MICROALBUR 7.8* 06/06/2015    No results found.  Assessment & Plan:   Thereasa DistanceRodney was seen today for hyperlipidemia, hypertension and diabetes.  Diagnoses and all orders for this visit:  Essential hypertension- his blood pressure is well-controlled, lites and renal function are stable. -     Comprehensive metabolic panel; Future -     CBC with Differential/Platelet; Future -     Urinalysis, Routine w reflex microscopic (not [facility]); Future  Controlled type 2 diabetes mellitus without complication, without long-term current use of insulin (HCC)- his blood sugars are not well-controlled, his A1c is up to 8.6%. I will increase his therapy by switching him to [facility] instead of just plain metformin, will also increase the dose of metformin. -     Comprehensive metabolic panel; Future -     CBC with Differential/Platelet; Future -     Hemoglobin A1c; Future -     Microalbumin / creatinine urine ratio; Future  Hyperlipidemia  with target LDL less than 100- he is achieved his LDL goal and is doing well on the statin. -     Lipid panel; Future -     Comprehensive metabolic panel; Future -     CBC with Differential/Platelet; Future -     TSH; Future  Hypertriglyceridemia, essential -     Icosapent Ethyl 1 g CAPS; Take 2 capsules by mouth 2 (two) times daily. The patient must receive Vascepa - must be on EPA since DHA products raise the LDL-cholesterol  Encounter for immunization  Drug-induced erectile dysfunction -     sildenafil (VIAGRA) 100 MG tablet; Take 1 tablet (100 mg total) by mouth daily as needed for erectile dysfunction.  Other orders -     Flu Vaccine QUAD 36+ mos IM   I have discontinued Mr. Patty SermonsLeath's metFORMIN and metFORMIN. I am also having him start on Icosapent Ethyl and Alogliptin-Metformin HCl. Additionally, I am having him maintain his multivitamin, fluticasone, warfarin, pravastatin, hydrochlorothiazide, benazepril, metoprolol succinate, flecainide, amLODipine, and sildenafil.  Meds  ordered this encounter  Medications  . Icosapent Ethyl 1 g CAPS    Sig: Take 2 capsules by mouth 2 (two) times daily. The patient must receive Vascepa - must be on EPA since DHA products raise the LDL-cholesterol    Dispense:  120 capsule    Refill:  11  . sildenafil (VIAGRA) 100 MG tablet    Sig: Take 1 tablet (100 mg total) by mouth daily as needed for erectile dysfunction.    Dispense:  30 tablet    Refill:  3  . DISCONTD: metFORMIN (GLUCOPHAGE-XR) 500 MG 24 hr tablet    Sig: TAKE TWO TABLETS BY MOUTH EVERY DAY WITH BREAKFAST    Dispense:  180 tablet    Refill:  1  . Alogliptin-Metformin HCl (KAZANO) 12.09-998 MG TABS    Sig: Take 1 tablet by mouth 2 (two) times daily.    Dispense:  60 tablet    Refill:  11     Follow-up: Return in about 4 months (around 10/04/2015).  Sanda Linger, MD

## 2015-06-06 NOTE — Progress Notes (Signed)
I have reviewed and agree with the plan. 

## 2015-06-06 NOTE — Progress Notes (Signed)
Pre visit review using our clinic review tool, if applicable. No additional management support is needed unless otherwise documented below in the visit note. 

## 2015-06-06 NOTE — Patient Instructions (Signed)

## 2015-06-12 ENCOUNTER — Other Ambulatory Visit: Payer: Self-pay | Admitting: Internal Medicine

## 2015-06-12 ENCOUNTER — Encounter: Payer: Self-pay | Admitting: Internal Medicine

## 2015-06-12 DIAGNOSIS — E119 Type 2 diabetes mellitus without complications: Secondary | ICD-10-CM

## 2015-06-12 MED ORDER — METFORMIN HCL 500 MG PO TABS: 1000.0000 mg | ORAL_TABLET | Freq: Two times a day (BID) | ORAL | Status: DC

## 2015-06-18 ENCOUNTER — Encounter: Payer: Self-pay | Admitting: Internal Medicine

## 2015-07-03 ENCOUNTER — Ambulatory Visit (INDEPENDENT_AMBULATORY_CARE_PROVIDER_SITE_OTHER): Payer: 59 | Admitting: *Deleted

## 2015-07-03 DIAGNOSIS — I4891 Unspecified atrial fibrillation: Secondary | ICD-10-CM | POA: Diagnosis not present

## 2015-07-03 DIAGNOSIS — Z7901 Long term (current) use of anticoagulants: Secondary | ICD-10-CM | POA: Diagnosis not present

## 2015-07-03 LAB — POCT INR: INR: 1.9

## 2015-07-19 ENCOUNTER — Encounter: Payer: Self-pay | Admitting: Pulmonary Disease

## 2015-07-26 ENCOUNTER — Other Ambulatory Visit: Payer: Self-pay | Admitting: Cardiology

## 2015-07-30 ENCOUNTER — Ambulatory Visit (INDEPENDENT_AMBULATORY_CARE_PROVIDER_SITE_OTHER): Payer: 59

## 2015-07-30 DIAGNOSIS — I4891 Unspecified atrial fibrillation: Secondary | ICD-10-CM

## 2015-07-30 DIAGNOSIS — Z7901 Long term (current) use of anticoagulants: Secondary | ICD-10-CM

## 2015-07-30 LAB — POCT INR: INR: 2.3

## 2015-09-04 ENCOUNTER — Ambulatory Visit (INDEPENDENT_AMBULATORY_CARE_PROVIDER_SITE_OTHER): Payer: 59 | Admitting: Surgery

## 2015-09-04 DIAGNOSIS — Z7901 Long term (current) use of anticoagulants: Secondary | ICD-10-CM | POA: Diagnosis not present

## 2015-09-04 DIAGNOSIS — I4891 Unspecified atrial fibrillation: Secondary | ICD-10-CM

## 2015-09-04 LAB — POCT INR: INR: 2.2

## 2015-09-24 ENCOUNTER — Telehealth: Payer: Self-pay

## 2015-09-24 ENCOUNTER — Encounter: Payer: Self-pay | Admitting: Family

## 2015-09-24 ENCOUNTER — Ambulatory Visit (INDEPENDENT_AMBULATORY_CARE_PROVIDER_SITE_OTHER): Payer: 59 | Admitting: Family

## 2015-09-24 VITALS — BP 140/96 | HR 79 | Temp 98.8°F | Ht 70.0 in | Wt 270.0 lb

## 2015-09-24 DIAGNOSIS — E119 Type 2 diabetes mellitus without complications: Secondary | ICD-10-CM

## 2015-09-24 DIAGNOSIS — J309 Allergic rhinitis, unspecified: Secondary | ICD-10-CM

## 2015-09-24 MED ORDER — FLUTICASONE PROPIONATE 50 MCG/ACT NA SUSP: 2.0000 | Freq: Every day | NASAL | Status: AC

## 2015-09-24 MED ORDER — ALBUTEROL SULFATE HFA 108 (90 BASE) MCG/ACT IN AERS: 2.0000 | INHALATION_SPRAY | Freq: Four times a day (QID) | RESPIRATORY_TRACT | Status: DC | PRN

## 2015-09-24 MED ORDER — LORATADINE 10 MG PO TABS: 10.0000 mg | ORAL_TABLET | Freq: Every day | ORAL | Status: DC

## 2015-09-24 NOTE — Patient Instructions (Signed)
Suspect seasonal allergies.   Increase intake of clear fluids. Congestion is best treated by hydration, when mucus is wetter, it is thinner, less sticky, and easier to expel from the body, either through coughing up drainage, or by blowing your nose.   Get plenty of rest.   Use saline nasal drops and blow your nose frequently. Run a humidifier at night and elevate the head of the bed. Vicks Vapor rub will help with congestion and cough. Steam showers and sinus massage for congestion.   Use Acetaminophen or Ibuprofen as needed for fever or pain. Avoid second hand smoke. Even the smallest exposure will worsen symptoms.   Over the counter medications you can try include Delsym for cough, a decongestant for congestion, and Mucinex or Robitussin as an expectorant. Be sure to just get the plain Mucinex or Robitussin that just has one medication (Guaifenesen). We don't recommend the combination products. Note, be sure to drink two glasses of water with each dose of Mucinex as the medication will not work well without adequate hydration.   You can also try a teaspoon of honey to see if this will help reduce cough. Throat lozenges can sometimes be beneficial as well.    This illness will typically last 7 - 10 days.   Please follow up with our clinic if you develop a fever greater than 101 F, symptoms worsen, or do not resolve in the next week.    

## 2015-09-24 NOTE — Progress Notes (Signed)
Pre visit review using our clinic review tool, if applicable. No additional management support is needed unless otherwise documented below in the visit note. 

## 2015-09-24 NOTE — Progress Notes (Signed)
Subjective:    Patient ID: Daniel Grant, male    DOB: 1965/11/24, 50 y.o.   MRN: [id number]   Daniel Grant is a 50 y.o. male who presents today for an acute visit.    HPI Comments: Patient here for evaluation chills, sore throat, wheezing, and 'little bit of coughing' 3 days ago. Patient was cutting grass on saturday afternoon and thinks he may be allergic to the pollen. Works in Aeronautical [employer]. Describes throat as dry and a little sore. Sleeps with Cipap and makes dry mouth worse.     Past Medical History  Diagnosis Date  . Hyperlipidemia   . OSA (obstructive sleep apnea)   . Hypertension   . DM II (diabetes mellitus, type II), controlled (HCC)   . Paroxysmal atrial fibrillation (HCC)   . Obesity   . Erectile dysfunction    Review of patient's allergies indicates no known allergies. Current Outpatient Prescriptions on File Prior to Visit  Medication Sig Dispense Refill  . amLODipine (NORVASC) 5 MG tablet Take 1 tablet by mouth  daily 90 tablet 3  . benazepril (LOTENSIN) 20 MG tablet Take 1 tablet by mouth  daily 90 tablet 3  . flecainide (TAMBOCOR) 100 MG tablet Take 1 tablet by mouth two  times daily 180 tablet 3  . fluticasone (FLONASE) 50 MCG/ACT nasal spray Place 2 sprays into both nostrils daily. 16 g 5  . hydrochlorothiazide (HYDRODIURIL) 12.5 MG tablet Take 1 tablet by mouth  daily 90 tablet 3  . Icosapent Ethyl 1 g CAPS Take 2 capsules by mouth 2 (two) times daily. The patient must receive Vascepa - must be on EPA since DHA products raise the LDL-cholesterol 120 capsule 11  . metFORMIN (GLUCOPHAGE) 500 MG tablet Take 2 tablets (1,000 mg total) by mouth 2 (two) times daily with a meal. 360 tablet 1  . metoprolol succinate (TOPROL-XL) 50 MG 24 hr tablet Take 3 tablets by mouth  daily 270 tablet 3  . Multiple Vitamin (MULTIVITAMIN) tablet Take 1 tablet by mouth daily.      . pravastatin (PRAVACHOL) 40 MG tablet Take 1 tablet by mouth  daily 90 tablet 3  . sildenafil  (VIAGRA) 100 MG tablet Take 1 tablet (100 mg total) by mouth daily as needed for erectile dysfunction. 30 tablet 3  . warfarin (COUMADIN) 10 MG tablet Take 1/2 to 1 tablet by mouth daily as directed by anticoagulation clinic 90 tablet 1   No current facility-administered medications on file prior to visit.    Social History  Substance Use Topics  . Smoking status: Former Smoker    Quit date: 05/26/1994  . Smokeless tobacco: Never Used     Comment: quit in 1995  . Alcohol Use: 3.0 oz/week    5 Cans of beer per week    Review of Systems  Constitutional: Positive for chills. Negative for fever.  HENT: Positive for congestion and sore throat. Negative for ear pain, rhinorrhea and sinus pressure.   Respiratory: Positive for cough. Negative for shortness of breath and wheezing.   Cardiovascular: Negative for chest pain and palpitations.  Gastrointestinal: Negative for nausea, vomiting and diarrhea.  Musculoskeletal: Negative for myalgias.  Neurological: Negative for headaches.      Objective:    BP 140/96 mmHg  Pulse 79  Temp(Src) 98.8 F (37.1 C) (Oral)  Ht  (1.778 m)  Wt 270 lb (122.471 kg)  BMI 38.74 kg/m2  SpO2 97%   Physical Exam  Constitutional: Vital signs are  normal. He appears well-developed and well-nourished.  HENT:  Head: Normocephalic and atraumatic.  Right Ear: Hearing, tympanic membrane, external ear and ear canal normal. No drainage, swelling or tenderness. Tympanic membrane is not injected, not erythematous and not bulging. No middle ear effusion. No decreased hearing is noted.  Left Ear: Hearing, tympanic membrane, external ear and ear canal normal. No drainage, swelling or tenderness. Tympanic membrane is not injected, not erythematous and not bulging.  No middle ear effusion. No decreased hearing is noted.  Nose: Rhinorrhea present. Right sinus exhibits no maxillary sinus tenderness and no frontal sinus tenderness. Left sinus exhibits no maxillary sinus  tenderness and no frontal sinus tenderness.  Mouth/Throat: Uvula is midline, oropharynx is clear and moist and mucous membranes are normal. No oropharyngeal exudate, posterior oropharyngeal edema, posterior oropharyngeal erythema or tonsillar abscesses.  Eyes: Conjunctivae are normal.  Cardiovascular: Regular rhythm and normal heart sounds.   Pulmonary/Chest: Effort normal and breath sounds normal. No respiratory distress. He has no wheezes. He has no rhonchi. He has no rales.  Lymphadenopathy:       Head (right side): No submental, no submandibular, no tonsillar, no preauricular, no posterior auricular and no occipital adenopathy present.       Head (left side): No submental, no submandibular, no tonsillar, no preauricular, no posterior auricular and no occipital adenopathy present.    He has no cervical adenopathy.  Neurological: He is alert.  Skin: Skin is warm and dry.  Psychiatric: He has a normal mood and affect. His speech is normal and behavior is normal.  Vitals reviewed.      Assessment & Plan:   1. Allergic rhinitis, unspecified allergic rhinitis type Suspect viral and allergic etiology. Patient is afebrile in no acute respiratory distress. Patient both agreed on approach to treat seasonal allergies.   - fluticasone (FLONASE) 50 MCG/ACT nasal spray; Place 2 sprays into both nostrils daily.  Dispense: 16 g; Refill: 5 - albuterol (PROVENTIL HFA) 108 (90 Base) MCG/ACT inhaler; Inhale 2 puffs into the lungs every 6 (six) hours as needed for wheezing or shortness of breath.  Dispense: 1 Inhaler; Refill: 1 - loratadine (CLARITIN) 10 MG tablet; Take 1 tablet (10 mg total) by mouth daily.  Dispense: 30 tablet; Refill: 2   I am having [name] maintain his multivitamin, fluticasone, pravastatin, hydrochlorothiazide, benazepril, metoprolol succinate, flecainide, amLODipine, Icosapent Ethyl, sildenafil, metFORMIN, and warfarin.   No orders of the defined types were placed in this  encounter.     Start medications as prescribed and explained to patient on After Visit Summary ( AVS). Risks, benefits, and alternatives of the medications and treatment plan prescribed today were discussed, and patient expressed understanding.   Education regarding symptom management and diagnosis given to patient.   Follow-up:Plan follow-up as discussed or as needed if any worsening symptoms or change in condition. No Follow-up on file.   Continue to follow with Sanda Lingerhomas Jones, MD for routine health maintenance.   Daniel Grant and I agreed with plan.   Rennie PlowmanMargaret Arnett, FNP

## 2015-09-25 NOTE — Telephone Encounter (Signed)
Willing to accept in 2018. If wants to be seen sooner at this office- I think Dr [place], Cory, and [facility] are accepting patients I believe.

## 2015-09-25 NOTE — Telephone Encounter (Signed)
Pt is a pt at the [facility] [facility], pt of Dr Yetta BarreJones. Pt wants to know if you will accept him as a transfer pt?   However, due to age restrictions, no appointments until late 2018. Pt wants to know if you will make an exception.   Advised pt you do not usually accept transfer pts. But would ask.

## 2015-09-26 ENCOUNTER — Ambulatory Visit: Payer: 59 | Admitting: Internal Medicine

## 2015-10-01 ENCOUNTER — Telehealth: Payer: Self-pay | Admitting: Family Medicine

## 2015-10-01 NOTE — Telephone Encounter (Signed)
Ok to schedule.

## 2015-10-01 NOTE — Telephone Encounter (Signed)
LM for call back to schedule appt. °

## 2015-10-01 NOTE — Telephone Encounter (Signed)
Pt would like to trans care to Dr Tabori, Please advise ok to schedule. °

## 2015-10-03 ENCOUNTER — Ambulatory Visit: Payer: 59 | Admitting: Internal Medicine

## 2015-10-04 ENCOUNTER — Ambulatory Visit (INDEPENDENT_AMBULATORY_CARE_PROVIDER_SITE_OTHER): Payer: 59 | Admitting: Family Medicine

## 2015-10-04 ENCOUNTER — Encounter: Payer: Self-pay | Admitting: Family Medicine

## 2015-10-04 VITALS — BP 123/83 | HR 65 | Temp 98.0°F | Resp 16 | Ht 70.0 in | Wt 272.1 lb

## 2015-10-04 DIAGNOSIS — I4819 Other persistent atrial fibrillation: Secondary | ICD-10-CM

## 2015-10-04 DIAGNOSIS — E785 Hyperlipidemia, unspecified: Secondary | ICD-10-CM | POA: Diagnosis not present

## 2015-10-04 DIAGNOSIS — I10 Essential (primary) hypertension: Secondary | ICD-10-CM

## 2015-10-04 DIAGNOSIS — I481 Persistent atrial fibrillation: Secondary | ICD-10-CM

## 2015-10-04 DIAGNOSIS — E119 Type 2 diabetes mellitus without complications: Secondary | ICD-10-CM | POA: Diagnosis not present

## 2015-10-04 LAB — HEPATIC FUNCTION PANEL
ALT: 23 U/L (ref 9–46)
AST: 19 U/L (ref 10–35)
Albumin: 4.2 g/dL (ref 3.6–5.1)
Alkaline Phosphatase: 82 U/L (ref 40–115)
Bilirubin, Direct: 0.1 mg/dL (ref <=0.2)
Indirect Bilirubin: 0.3 mg/dL (ref 0.2–1.2)
Total Bilirubin: 0.4 mg/dL (ref 0.2–1.2)
Total Protein: 6.7 g/dL (ref 6.1–8.1)

## 2015-10-04 LAB — BASIC METABOLIC PANEL
BUN: 20 mg/dL (ref 7–25)
CO2: 25 mmol/L (ref 20–31)
Calcium: 9.2 mg/dL (ref 8.6–10.3)
Chloride: 104 mmol/L (ref 98–110)
Creat: 0.94 mg/dL (ref 0.70–1.33)
Glucose, Bld: 81 mg/dL (ref 65–99)
Potassium: 4.3 mmol/L (ref 3.5–5.3)
Sodium: 140 mmol/L (ref 135–146)

## 2015-10-04 LAB — CBC WITH DIFFERENTIAL/PLATELET
Basophils Absolute: 0 cells/uL (ref 0–200)
Basophils Relative: 0 %
Eosinophils Absolute: 366 cells/uL (ref 15–500)
Eosinophils Relative: 3 %
HCT: 42.3 % (ref 38.5–50.0)
Hemoglobin: 14.6 g/dL (ref 13.2–17.1)
Lymphocytes Relative: 30 %
Lymphs Abs: 3660 cells/uL (ref 850–3900)
MCH: 28.3 pg (ref 27.0–33.0)
MCHC: 34.5 g/dL (ref 32.0–36.0)
MCV: 82 fL (ref 80.0–100.0)
MPV: 10 fL (ref 7.5–12.5)
Monocytes Absolute: 732 cells/uL (ref 200–950)
Monocytes Relative: 6 %
Neutro Abs: 7442 cells/uL (ref 1500–7800)
Neutrophils Relative %: 61 %
Platelets: 278 10*3/uL (ref 140–400)
RBC: 5.16 MIL/uL (ref 4.20–5.80)
RDW: 14 % (ref 11.0–15.0)
WBC: 12.2 10*3/uL — ABNORMAL HIGH (ref 3.8–10.8)

## 2015-10-04 LAB — LIPID PANEL
Cholesterol: 120 mg/dL — ABNORMAL LOW (ref 125–200)
HDL: 28 mg/dL — ABNORMAL LOW (ref >=40)
LDL Cholesterol: 65 mg/dL (ref <130)
Total CHOL/HDL Ratio: 4.3 Ratio (ref <=5.0)
Triglycerides: 134 mg/dL (ref <150)
VLDL: 27 mg/dL (ref <30)

## 2015-10-04 LAB — HEMOGLOBIN A1C
Hgb A1c MFr Bld: 6.7 % — ABNORMAL HIGH (ref <5.7)
Mean Plasma Glucose: 146 mg/dL

## 2015-10-04 LAB — TSH: TSH: 0.76 mIU/L (ref 0.40–4.50)

## 2015-10-04 NOTE — Progress Notes (Signed)
   Subjective:    Patient ID: Daniel Grant, male    DOB: 1966-02-23, 50 y.o.   MRN: [id number]  HPI New to establish.  Previous MD- Sanda Lingerhomas Jones  HTN- chronic problem, well controlled on Benazepril, Norvasc, HCTZ, metoprolol.  No CP, SOB, HAs, visual changes, edema.  Hyperlipidemia- chronic problem, on Pravastatin and fish oil daily.  Denies abd pain, N/V.  DM- chronic problem, on Metformin.  On ACE for renal protection.  UTD on foot exam, eye exam.  Pt has a [employer] membership.  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  Denies symptomatic lows.  No numbness/tingling of hands/feet  Persistent Afib- chronic problem, on Coumadin.  Following w/ Dr Jens Somrenshaw.  On Flecainide, Metoprolol for rate control.  Denies palpitations.   Review of Systems For ROS see HPI     Objective:   Physical Exam  Constitutional: He is oriented to person, place, and time. He appears well-developed and well-nourished. No distress.  obese  HENT:  Head: Normocephalic and atraumatic.  Eyes: Conjunctivae and EOM are normal. Pupils are equal, round, and reactive to light.  Neck: Normal range of motion. Neck supple. No thyromegaly present.  Cardiovascular: Normal rate, regular rhythm, normal heart sounds and intact distal pulses.   No murmur heard. Pulmonary/Chest: Effort normal and breath sounds normal. No respiratory distress.  Abdominal: Soft. Bowel sounds are normal. He exhibits no distension.  Musculoskeletal: He exhibits no edema.  Lymphadenopathy:    He has no cervical adenopathy.  Neurological: He is alert and oriented to person, place, and time. No cranial nerve deficit.  Skin: Skin is warm and dry.  Psychiatric: He has a normal mood and affect. His behavior is normal.  Vitals reviewed.         Assessment & Plan:

## 2015-10-04 NOTE — Patient Instructions (Signed)
Follow up in 3-4 months to recheck diabetes We'll notify you of your lab results and make any changes if needed Continue to work on healthy diet and regular exercise- you can do it! Try and use smaller plates to trick your brain into thinking you're full (b/c you ate it all!) Drink water at night, suck on candy, chew gum- just don't snack on junk!!! Call with any questions or concerns Welcome!  We're glad to have you!!!

## 2015-10-04 NOTE — Progress Notes (Signed)
Pre visit review using our clinic review tool, if applicable. No additional management support is needed unless otherwise documented below in the visit note. 

## 2015-10-04 NOTE — Telephone Encounter (Signed)
Pt has established with Dr Beverely Lowabori

## 2015-10-08 NOTE — Assessment & Plan Note (Signed)
Chronic problem.  Well controlled on Amlodipine, Benazepril, HCTZ, metoprolol.  Asymptomatic.  Check labs.  No anticipated med changes.

## 2015-10-08 NOTE — Assessment & Plan Note (Signed)
Chronic problem.  Following w/ Dr Jens Somrenshaw.  On Flecainide and Metoprolol.  On Coumadin- followed by coumadin clinic.  Asymptomatic at this time.  Will follow along.

## 2015-10-08 NOTE — Assessment & Plan Note (Signed)
Chronic problem.  Tolerating statin w/o difficulty.  Not taking fish oil as directed.  Stressed need for healthy diet and regular exercise.  Check labs.  Adjust meds prn

## 2015-10-08 NOTE — Assessment & Plan Note (Signed)
Chronic problem.  On Metformin.  On ACE for renal protection, UTD on foot exam, eye exam.  Stressed need for healthy diet and regular exercise.  Check labs.  Adjust meds prn

## 2015-10-16 ENCOUNTER — Ambulatory Visit (INDEPENDENT_AMBULATORY_CARE_PROVIDER_SITE_OTHER): Payer: 59 | Admitting: Pharmacist

## 2015-10-16 DIAGNOSIS — Z7901 Long term (current) use of anticoagulants: Secondary | ICD-10-CM

## 2015-10-16 DIAGNOSIS — I4891 Unspecified atrial fibrillation: Secondary | ICD-10-CM | POA: Diagnosis not present

## 2015-10-16 LAB — POCT INR: INR: 2.6

## 2015-11-28 ENCOUNTER — Ambulatory Visit (INDEPENDENT_AMBULATORY_CARE_PROVIDER_SITE_OTHER): Payer: 59 | Admitting: Pharmacist

## 2015-11-28 DIAGNOSIS — Z7901 Long term (current) use of anticoagulants: Secondary | ICD-10-CM

## 2015-11-28 DIAGNOSIS — I4891 Unspecified atrial fibrillation: Secondary | ICD-10-CM

## 2015-11-28 LAB — POCT INR: INR: 2.3

## 2015-12-27 ENCOUNTER — Encounter: Payer: Self-pay | Admitting: Family Medicine

## 2015-12-27 ENCOUNTER — Ambulatory Visit (INDEPENDENT_AMBULATORY_CARE_PROVIDER_SITE_OTHER): Payer: 59 | Admitting: Family Medicine

## 2015-12-27 VITALS — BP 121/82 | HR 61 | Temp 98.1°F | Resp 16 | Ht 70.0 in | Wt 270.2 lb

## 2015-12-27 DIAGNOSIS — E119 Type 2 diabetes mellitus without complications: Secondary | ICD-10-CM | POA: Diagnosis not present

## 2015-12-27 NOTE — Progress Notes (Signed)
Pre visit review using our clinic review tool, if applicable. No additional management support is needed unless otherwise documented below in the visit note. 

## 2015-12-27 NOTE — Patient Instructions (Signed)
Schedule your complete physical in 3-4 months Go to [facility] and get your labs done next week- no appt needed! Continue to work on healthy diet and regular exercise- you can do it! Call with any questions or concerns Enjoy the rest of your summer!!!

## 2015-12-27 NOTE — Progress Notes (Signed)
   Subjective:    Patient ID: Daniel Grant, male    DOB: [birth year], 50 y.o.   MRN: [id number]  HPI DM- chronic problem, on Metformin 500mg - 2 tabs in the AM, 1 tab QPM.  On ACE for renal protection.  UTD on foot exam, eye exam.  Denies symptomatic lows.  Home CBGs running 120s.  No numbness/tingling of hands/feet.  Denies CP, SOB, HAs, visual changes, edema.   Review of Systems For ROS see HPI     Objective:   Physical Exam  Constitutional: He is oriented to person, place, and time. He appears well-developed and well-nourished. No distress.  obese  HENT:  Head: Normocephalic and atraumatic.  Eyes: Conjunctivae and EOM are normal. Pupils are equal, round, and reactive to light.  Neck: Normal range of motion. Neck supple. No thyromegaly present.  Cardiovascular: Normal rate, regular rhythm, normal heart sounds and intact distal pulses.   No murmur heard. Pulmonary/Chest: Effort normal and breath sounds normal. No respiratory distress.  Abdominal: Soft. Bowel sounds are normal. He exhibits no distension.  Musculoskeletal: He exhibits no edema.  Lymphadenopathy:    He has no cervical adenopathy.  Neurological: He is alert and oriented to person, place, and time. No cranial nerve deficit.  Skin: Skin is warm and dry.  Psychiatric: He has a normal mood and affect. His behavior is normal.  Vitals reviewed.         Assessment & Plan:

## 2015-12-27 NOTE — Assessment & Plan Note (Signed)
Pt's home CBGs are well controlled.  Currently asymptomatic.  UTD on foot exam, eye exam.  On ARB for renal protection.  Stressed need for low carb diet and regular exercise.  Pt to get labs next week at [facility].  Adjust meds prn.

## 2016-01-01 ENCOUNTER — Other Ambulatory Visit (INDEPENDENT_AMBULATORY_CARE_PROVIDER_SITE_OTHER): Payer: 59

## 2016-01-01 DIAGNOSIS — E119 Type 2 diabetes mellitus without complications: Secondary | ICD-10-CM | POA: Diagnosis not present

## 2016-01-01 LAB — BASIC METABOLIC PANEL
BUN: 21 mg/dL (ref 6–23)
CO2: 28 mEq/L (ref 19–32)
Calcium: 9.7 mg/dL (ref 8.4–10.5)
Chloride: 102 mEq/L (ref 96–112)
Creatinine, Ser: 0.86 mg/dL (ref 0.40–1.50)
GFR: 119.93 mL/min (ref >60.00)
Glucose, Bld: 108 mg/dL — ABNORMAL HIGH (ref 70–99)
Potassium: 4.2 mEq/L (ref 3.5–5.1)
Sodium: 139 mEq/L (ref 135–145)

## 2016-01-01 LAB — HEMOGLOBIN A1C: Hgb A1c MFr Bld: 6.5 % (ref 4.6–6.5)

## 2016-01-08 ENCOUNTER — Ambulatory Visit (INDEPENDENT_AMBULATORY_CARE_PROVIDER_SITE_OTHER): Payer: 59

## 2016-01-08 DIAGNOSIS — I4891 Unspecified atrial fibrillation: Secondary | ICD-10-CM

## 2016-01-08 DIAGNOSIS — Z7901 Long term (current) use of anticoagulants: Secondary | ICD-10-CM | POA: Diagnosis not present

## 2016-01-08 LAB — POCT INR: INR: 2.9

## 2016-02-04 ENCOUNTER — Ambulatory Visit: Payer: Self-pay | Admitting: Family Medicine

## 2016-02-19 ENCOUNTER — Ambulatory Visit (INDEPENDENT_AMBULATORY_CARE_PROVIDER_SITE_OTHER): Payer: 59

## 2016-02-19 DIAGNOSIS — Z7901 Long term (current) use of anticoagulants: Secondary | ICD-10-CM | POA: Diagnosis not present

## 2016-02-19 DIAGNOSIS — I4891 Unspecified atrial fibrillation: Secondary | ICD-10-CM

## 2016-02-19 LAB — POCT INR: INR: 2

## 2016-03-04 ENCOUNTER — Other Ambulatory Visit: Payer: Self-pay | Admitting: Internal Medicine

## 2016-03-04 ENCOUNTER — Encounter: Payer: Self-pay | Admitting: Family Medicine

## 2016-03-04 DIAGNOSIS — E119 Type 2 diabetes mellitus without complications: Secondary | ICD-10-CM

## 2016-03-04 MED ORDER — METFORMIN HCL 500 MG PO TABS: 1000.0000 mg | ORAL_TABLET | Freq: Two times a day (BID) | ORAL | 1 refills | Status: DC

## 2016-04-01 ENCOUNTER — Ambulatory Visit (INDEPENDENT_AMBULATORY_CARE_PROVIDER_SITE_OTHER): Payer: 59

## 2016-04-01 DIAGNOSIS — I4891 Unspecified atrial fibrillation: Secondary | ICD-10-CM | POA: Diagnosis not present

## 2016-04-01 DIAGNOSIS — Z7901 Long term (current) use of anticoagulants: Secondary | ICD-10-CM | POA: Diagnosis not present

## 2016-04-01 LAB — POCT INR: INR: 2.4

## 2016-04-02 ENCOUNTER — Encounter: Payer: Self-pay | Admitting: Family Medicine

## 2016-04-02 ENCOUNTER — Ambulatory Visit (INDEPENDENT_AMBULATORY_CARE_PROVIDER_SITE_OTHER): Payer: 59 | Admitting: Family Medicine

## 2016-04-02 VITALS — BP 122/82 | HR 114 | Temp 98.1°F | Resp 16 | Ht 70.0 in | Wt 278.0 lb

## 2016-04-02 DIAGNOSIS — E119 Type 2 diabetes mellitus without complications: Secondary | ICD-10-CM | POA: Diagnosis not present

## 2016-04-02 DIAGNOSIS — Z0000 Encounter for general adult medical examination without abnormal findings: Secondary | ICD-10-CM | POA: Diagnosis not present

## 2016-04-02 DIAGNOSIS — M7989 Other specified soft tissue disorders: Secondary | ICD-10-CM

## 2016-04-02 DIAGNOSIS — R7989 Other specified abnormal findings of blood chemistry: Secondary | ICD-10-CM | POA: Diagnosis not present

## 2016-04-02 DIAGNOSIS — M799 Soft tissue disorder, unspecified: Secondary | ICD-10-CM | POA: Diagnosis not present

## 2016-04-02 DIAGNOSIS — G4733 Obstructive sleep apnea (adult) (pediatric): Secondary | ICD-10-CM | POA: Diagnosis not present

## 2016-04-02 LAB — HEPATIC FUNCTION PANEL
ALT: 23 U/L (ref 0–53)
AST: 14 U/L (ref 0–37)
Albumin: 4.1 g/dL (ref 3.5–5.2)
Alkaline Phosphatase: 86 U/L (ref 39–117)
Bilirubin, Direct: 0.1 mg/dL (ref 0.0–0.3)
Total Bilirubin: 0.6 mg/dL (ref 0.2–1.2)
Total Protein: 6.4 g/dL (ref 6.0–8.3)

## 2016-04-02 LAB — BASIC METABOLIC PANEL
BUN: 26 mg/dL — ABNORMAL HIGH (ref 6–23)
CO2: 27 mEq/L (ref 19–32)
Calcium: 9.4 mg/dL (ref 8.4–10.5)
Chloride: 104 mEq/L (ref 96–112)
Creatinine, Ser: 0.97 mg/dL (ref 0.40–1.50)
GFR: 104.27 mL/min (ref >60.00)
Glucose, Bld: 148 mg/dL — ABNORMAL HIGH (ref 70–99)
Potassium: 4.2 mEq/L (ref 3.5–5.1)
Sodium: 138 mEq/L (ref 135–145)

## 2016-04-02 LAB — CBC WITH DIFFERENTIAL/PLATELET
Basophils Absolute: 0.1 10*3/uL (ref 0.0–0.1)
Basophils Relative: 0.5 % (ref 0.0–3.0)
Eosinophils Absolute: 0.3 10*3/uL (ref 0.0–0.7)
Eosinophils Relative: 2.5 % (ref 0.0–5.0)
HCT: 43.6 % (ref 39.0–52.0)
Hemoglobin: 14.8 g/dL (ref 13.0–17.0)
Lymphocytes Relative: 30.8 % (ref 12.0–46.0)
Lymphs Abs: 3.6 10*3/uL (ref 0.7–4.0)
MCHC: 33.8 g/dL (ref 30.0–36.0)
MCV: 84.2 fl (ref 78.0–100.0)
Monocytes Absolute: 0.7 10*3/uL (ref 0.1–1.0)
Monocytes Relative: 6.4 % (ref 3.0–12.0)
Neutro Abs: 6.9 10*3/uL (ref 1.4–7.7)
Neutrophils Relative %: 59.8 % (ref 43.0–77.0)
Platelets: 235 10*3/uL (ref 150.0–400.0)
RBC: 5.18 Mil/uL (ref 4.22–5.81)
RDW: 13.5 % (ref 11.5–15.5)
WBC: 11.6 10*3/uL — ABNORMAL HIGH (ref 4.0–10.5)

## 2016-04-02 LAB — PSA: PSA: 0.48 ng/mL (ref 0.10–4.00)

## 2016-04-02 LAB — LIPID PANEL
Cholesterol: 134 mg/dL (ref 0–200)
HDL: 37.1 mg/dL — ABNORMAL LOW (ref >39.00)
NonHDL: 96.92
Total CHOL/HDL Ratio: 4
Triglycerides: 203 mg/dL — ABNORMAL HIGH (ref 0.0–149.0)
VLDL: 40.6 mg/dL — ABNORMAL HIGH (ref 0.0–40.0)

## 2016-04-02 LAB — TSH: TSH: 0.67 u[IU]/mL (ref 0.35–4.50)

## 2016-04-02 LAB — LDL CHOLESTEROL, DIRECT: Direct LDL: 82 mg/dL

## 2016-04-02 LAB — HEMOGLOBIN A1C: Hgb A1c MFr Bld: 6.5 % (ref 4.6–6.5)

## 2016-04-02 NOTE — Progress Notes (Signed)
Pre visit review using our clinic review tool, if applicable. No additional management support is needed unless otherwise documented below in the visit note. 

## 2016-04-02 NOTE — Assessment & Plan Note (Signed)
Refer to pulm for updated sleep study in order to get new equipment.  Pt expressed understanding and is in agreement w/ plan.

## 2016-04-02 NOTE — Assessment & Plan Note (Signed)
Chronic problem.  UTD on foot exam, eye exam.  On ACE for renal protection.  Stressed need for healthy diet and regular exercise.  Check labs.  Adjust meds prn

## 2016-04-02 NOTE — Patient Instructions (Signed)
Follow up in 3-4 months to recheck diabetes We'll notify you of your lab results and make any changes if needed Continue to work on healthy diet and regular exercise- you can do it! You are due for your eye exam in January- please schedule at your convenience Call with any questions or concerns Happy Holidays!!!

## 2016-04-02 NOTE — Progress Notes (Signed)
   Subjective:    Patient ID: Daniel FusiRodney L Mcfayden, male    DOB: Jan 07, 1966, 50 y.o.   MRN: [id number]  HPI CPE- UTD on colonoscopy, Tdap.  Due for flu shot.  Needs a new sleep study for new CPAP machine.  Pt has gained 8 lbs since last visit   Review of Systems Patient reports no vision/hearing changes, anorexia, fever ,adenopathy, persistant/recurrent hoarseness, swallowing issues, chest pain, palpitations, edema, persistant/recurrent cough, hemoptysis, dyspnea (rest,exertional, paroxysmal nocturnal), gastrointestinal  bleeding (melena, rectal bleeding), abdominal pain, excessive heart burn, GU symptoms (dysuria, hematuria, voiding/incontinence issues) syncope, focal weakness, memory loss, numbness & tingling, skin/hair/nail changes, depression, anxiety, abnormal bruising/bleeding, musculoskeletal symptoms/signs.     Objective:   Physical Exam General Appearance:    Alert, cooperative, no distress, appears stated age, obese  Head:    Normocephalic, without obvious abnormality, atraumatic  Eyes:    PERRL, conjunctiva/corneas clear, EOM's intact, fundi    benign, both eyes       Ears:    Normal TM's and external ear canals, both ears  Nose:   Nares normal, septum midline, mucosa normal, no drainage   or sinus tenderness  Throat:   Lips, mucosa, and tongue normal; teeth and gums normal  Neck:   Supple, symmetrical, trachea midline, no adenopathy;       thyroid:  No enlargement/tenderness/nodules  Back:     Symmetric, no curvature, ROM normal, no CVA tenderness.  2" x 1.5" soft tissue mass over L scapula  Lungs:     Clear to auscultation bilaterally, respirations unlabored  Chest wall:    No tenderness or deformity  Heart:    Regular rate and rhythm, S1 and S2 normal, no murmur, rub   or gallop  Abdomen:     Soft, non-tender, bowel sounds active all four quadrants,    no masses, no organomegaly  Genitalia:    Deferred at pt's request  Rectal:    Extremities:   Extremities normal, atraumatic,  no cyanosis or edema  Pulses:   2+ and symmetric all extremities  Skin:   Skin color, texture, turgor normal, no rashes or lesions  Lymph nodes:   Cervical, supraclavicular, and axillary nodes normal  Neurologic:   CNII-XII intact. Normal strength, sensation and reflexes      throughout          Assessment & Plan:

## 2016-04-02 NOTE — Assessment & Plan Note (Signed)
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  UTD on colonoscopy, Tdap.  Flu shot given today.  Check labs.  Anticipatory guidance provided.

## 2016-04-28 ENCOUNTER — Other Ambulatory Visit: Payer: Self-pay | Admitting: Cardiology

## 2016-04-28 NOTE — Telephone Encounter (Signed)
Rx(s) sent to pharmacy electronically.  

## 2016-05-13 ENCOUNTER — Ambulatory Visit (INDEPENDENT_AMBULATORY_CARE_PROVIDER_SITE_OTHER): Payer: 59 | Admitting: *Deleted

## 2016-05-13 DIAGNOSIS — I4891 Unspecified atrial fibrillation: Secondary | ICD-10-CM

## 2016-05-13 DIAGNOSIS — Z7901 Long term (current) use of anticoagulants: Secondary | ICD-10-CM

## 2016-05-13 LAB — POCT INR: INR: 2.8

## 2016-05-20 NOTE — Progress Notes (Deleted)
HPI: FU atrial fibrillation. Cardiac catheterization in January of 2001 showed normal LV function and normal coronary arteries. Nuclear study in June of 2011 showed soft tissue attenuation and no ischemia. Patient has been treated with flecainide for atrial fibrillation. He did have an exercise treadmill in September of 2011 and showed no exercise induced ventricular tachycardia. Last echocardiogram November 2015 showed normal LV function, trace mitral and tricuspid regurgitation. Since last seen,   Current Outpatient Prescriptions  Medication Sig Dispense Refill  . amLODipine (NORVASC) 5 MG tablet TAKE 1 TABLET BY MOUTH  DAILY 90 tablet 0  . benazepril (LOTENSIN) 20 MG tablet TAKE 1 TABLET BY MOUTH  DAILY 90 tablet 0  . flecainide (TAMBOCOR) 100 MG tablet TAKE 1 TABLET BY MOUTH TWO  TIMES DAILY 180 tablet 0  . fluticasone (FLONASE) 50 MCG/ACT nasal spray Place 2 sprays into both nostrils daily. (Patient taking differently: Place 2 sprays into both nostrils daily as needed. ) 16 g 5  . hydrochlorothiazide (HYDRODIURIL) 12.5 MG tablet TAKE 1 TABLET BY MOUTH  DAILY 90 tablet 0  . metFORMIN (GLUCOPHAGE) 500 MG tablet Take 2 tablets (1,000 mg total) by mouth 2 (two) times daily with a meal. 360 tablet 1  . metoprolol succinate (TOPROL-XL) 50 MG 24 hr tablet TAKE 3 TABLETS BY MOUTH  DAILY 270 tablet 0  . Multiple Vitamin (MULTIVITAMIN) tablet Take 1 tablet by mouth daily.      . Omega-3 Fatty Acids (FISH OIL) 1200 MG CAPS Take 2 capsules by mouth 2 (two) times daily.    . pravastatin (PRAVACHOL) 40 MG tablet TAKE 1 TABLET BY MOUTH  DAILY 90 tablet 0  . sildenafil (VIAGRA) 100 MG tablet Take 1 tablet (100 mg total) by mouth daily as needed for erectile dysfunction. 30 tablet 3  . warfarin (COUMADIN) 10 MG tablet Take 1/2 to 1 tablet by mouth daily as directed by anticoagulation clinic 90 tablet 1   No current facility-administered medications for this visit.      Past Medical History:    Diagnosis Date  . DM II (diabetes mellitus, type II), controlled (HCC)   . Erectile dysfunction   . Hyperlipidemia   . Hypertension   . Obesity   . OSA (obstructive sleep apnea)   . Paroxysmal atrial fibrillation [facility](HCC)     Past Surgical History:  Procedure Laterality Date  . CARDIAC CATHETERIZATION    . FINGER SURGERY     left 1st finger    Social History   Social History  . Marital status: Married    Spouse name: N/A  . Number of children: N/A  . Years of education: N/A   Occupational History  . Not on file.   Social History Main Topics  . Smoking status: Former Smoker    Quit date: 05/26/1994  . Smokeless tobacco: Never Used     Comment: quit in 1995  . Alcohol use 3.0 oz/week    5 Cans of beer per week  . Drug use: No  . Sexual activity: Yes   Other Topics Concern  . Not on file   Social History Narrative  . No narrative on file    Family History  Problem Relation Age of Onset  . Heart attack Paternal Uncle   . Heart attack Maternal Grandmother   . Heart disease Mother   . Hypertension Mother   . Diabetes Mother   . Alcohol abuse Father   . Cancer Neg Hx   . Early  death Neg Hx   . Hyperlipidemia Neg Hx   . Kidney disease Neg Hx   . Stroke Neg Hx     ROS: no fevers or chills, productive cough, hemoptysis, dysphasia, odynophagia, melena, hematochezia, dysuria, hematuria, rash, seizure activity, orthopnea, PND, pedal edema, claudication. Remaining systems are negative.  Physical Exam: Well-developed well-nourished in no acute distress.  Skin is warm and dry.  HEENT is normal.  Neck is supple.  Chest is clear to auscultation with normal expansion.  Cardiovascular exam is regular rate and rhythm.  Abdominal exam nontender or distended. No masses palpated. Extremities show no edema. neuro grossly intact  ECG

## 2016-05-29 ENCOUNTER — Ambulatory Visit: Payer: 59 | Admitting: Cardiology

## 2016-06-03 ENCOUNTER — Other Ambulatory Visit: Payer: Self-pay | Admitting: Cardiology

## 2016-06-16 ENCOUNTER — Ambulatory Visit (INDEPENDENT_AMBULATORY_CARE_PROVIDER_SITE_OTHER): Payer: 59 | Admitting: Pulmonary Disease

## 2016-06-16 ENCOUNTER — Encounter: Payer: Self-pay | Admitting: Pulmonary Disease

## 2016-06-16 VITALS — BP 126/84 | HR 62 | Ht 70.0 in | Wt 284.4 lb

## 2016-06-16 DIAGNOSIS — G4733 Obstructive sleep apnea (adult) (pediatric): Secondary | ICD-10-CM | POA: Insufficient documentation

## 2016-06-16 NOTE — Patient Instructions (Signed)
  It was a pleasure taking care of you today!  You are diagnosed with Obstructive Sleep Apnea or OSA.   We will order you an autoCPAP  Machine (5 - 15 cm water)   Please call the office if you do NOT receive your machine in the next 1-2 weeks.   Please make sure you use your CPAP device everytime you sleep.  We will monitor the usage of your machine per your insurance requirement.  Your insurance company may take the machine from you if you are not using it regularly.   Please clean the mask, tubings, filter, water reservoir with soapy water every week.  Please use distilled water for the water reservoir.   Please call the office or your machine provider (DME company) if you are having issues with the device.   Return to clinic in 6-8 weeks with Dr. Christene Slatese Dios or NP.

## 2016-06-16 NOTE — Assessment & Plan Note (Addendum)
Pt  was diagnosed with OSA in 2004.   He was very symptomatic with snoring, witnessed apneas, gasping, choking.  Had unrefreshed sleep.  Hypersomnia affected his functionality.  He had a lab study which showed severe OSA with AHI 30.  He was placed on cpap 8 cm water.  Sx are better. Improved. Felt benefit of cpap.   He is on his current machine. He got his current machine in 2010.  Machine is not functional at times. It is not delivering enough pressure, it is noisy.  Not reliable. His machine has lost its luster.  Humidifier is not working well.  He is starting to be more symptomatic.  Hypersomnia affects his fxnality.  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  Recent download showed excelent compliance but machine can not do AHI.   Pt has been compliant with cpap but machine has lost its luster and it is malfunctioning at times.   Plan :  We extensively discussed the diagnosis, pathophysiology, and treatment options for Obstructive Sleep Apnea (OSA).   We discussed treatment options for OSA including CPAP, BiPaP, as well as surgical options and oral devices.   We will start patient on autocpap 5-15 cm water. He is currently on 8 cm water and no AHI can be doen from machine.  No sleep study unless insurance wants it.  If ever, will need a split night study.  He may actually need a bipap. Plan for a 2 week and 4 week DL.  Needs DOT physical in 06/2016.   Patient was instructed to call the office if he/she has not received the PAP device in 1-2 weeks.  Patient was instructed to have mask, tubings, filter, reservoir cleaned at least once a week with soapy water.  Patient was instructed to call the office if he/she is having issues with the PAP device.    I advised patient to obtain sufficient amount of sleep --  7 to 8 hours at least in a 24 hr period.  Patient was advised to follow good sleep hygiene.  Patient was advised NOT to engage in activities  requiring concentration and/or vigilance if he/she is and  sleepy.  Patient is NOT to drive if he/she is sleepy.

## 2016-06-16 NOTE — Assessment & Plan Note (Signed)
Weight reduction 

## 2016-06-16 NOTE — Progress Notes (Signed)
Subjective:    Patient ID: Daniel Grant, male    DOB: [birth year], 51 y.o.   MRN: [id number]  HPI   This is the case of Daniel Grant, 51 y.o. Male, who was referred by Dr. Neena Rhymes  in consultation regarding OSA.   As you very well know, patient has a 8 PY smoking history, quit in his 30s, not been diagnosed with asthma or copd.  He was diagnosed with OSA in 2004.   He was very symptomatic with snoring, witnessed apneas, gasping, choking.  Had unrefreshed sleep.  Hypersomnia affected his functionality.  He had a lab study which showed severe OSA with AHI 30.  He was placed on cpap 8 cm water.  Sx are better. Improved. Felt benefit of cpap.   He is on his current machine. He got his current machine in 2010.  Machine is not functional at times. It is not delivering enough pressure, it is noisy.  Not reliable. His machine has lost its luster.  Humidifier is not working well.  He is starting to be more symptomatic.  Hypersomnia affects his fxnality.  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   ESS 11.      Review of Systems  Constitutional: Negative.  Negative for fever and unexpected weight change.  HENT: Negative.  Negative for congestion, dental problem, ear pain, nosebleeds, postnasal drip, rhinorrhea, sinus pressure, sneezing, sore throat and trouble swallowing.   Eyes: Negative.  Negative for redness and itching.  Respiratory: Negative for cough, chest tightness, shortness of breath and wheezing.   Cardiovascular: Positive for leg swelling. Negative for palpitations.  Gastrointestinal: Negative.  Negative for nausea and vomiting.  Endocrine: Negative.   Genitourinary: Negative.  Negative for dysuria.  Musculoskeletal: Negative.  Negative for joint swelling.  Skin: Negative.  Negative for rash.  Allergic/Immunologic: Negative.   Neurological: Negative.  Negative for headaches.  Hematological: Bruises/bleeds easily.    Psychiatric/Behavioral: Negative.  Negative for dysphoric mood. The patient is not nervous/anxious.    Past Medical History:  Diagnosis Date  . DM II (diabetes mellitus, type II), controlled (HCC)   . Erectile dysfunction   . Hyperlipidemia   . Hypertension   . Obesity   . OSA (obstructive sleep apnea)   . Paroxysmal atrial fibrillation (HCC)    (-) CA, DVT  Family History  Problem Relation Age of Onset  . Heart attack Paternal Uncle   . Heart attack Maternal Grandmother   . Heart disease Mother   . Hypertension Mother   . Diabetes Mother   . Alcohol abuse Father   . Cancer Neg Hx   . Early death Neg Hx   . Hyperlipidemia Neg Hx   . Kidney disease Neg Hx   . Stroke Neg Hx      Past Surgical History:  Procedure Laterality Date  . CARDIAC CATHETERIZATION    . FINGER SURGERY     left 1st finger    Social History   Social History  . Marital status: Married    Spouse name: N/A  . Number of children: N/A  . Years of education: N/A   Occupational History  . Not on file.   Social History Main Topics  . Smoking status: Former Smoker    Quit date: 05/26/1994  . Smokeless tobacco: Never Used     Comment: quit in 1995  . Alcohol use 3.0 oz/week    5 Cans of  beer per week  . Drug use: No  . Sexual activity: Yes   Other Topics Concern  . Not on file   Social History Narrative  . No narrative on file     No Known Allergies   Outpatient Medications Prior to Visit  Medication Sig Dispense Refill  . amLODipine (NORVASC) 5 MG tablet TAKE 1 TABLET BY MOUTH  DAILY 90 tablet 0  . benazepril (LOTENSIN) 20 MG tablet TAKE 1 TABLET BY MOUTH  DAILY 90 tablet 0  . flecainide (TAMBOCOR) 100 MG tablet TAKE 1 TABLET BY MOUTH TWO  TIMES DAILY 180 tablet 0  . fluticasone (FLONASE) 50 MCG/ACT nasal spray Place 2 sprays into both nostrils daily. (Patient taking differently: Place 2 sprays into both nostrils daily as needed. ) 16 g 5  . hydrochlorothiazide (HYDRODIURIL) 12.5 MG  tablet TAKE 1 TABLET BY MOUTH  DAILY 90 tablet 0  . metFORMIN (GLUCOPHAGE) 500 MG tablet Take 2 tablets (1,000 mg total) by mouth 2 (two) times daily with a meal. 360 tablet 1  . metoprolol succinate (TOPROL-XL) 50 MG 24 hr tablet TAKE 3 TABLETS BY MOUTH  DAILY 270 tablet 0  . Multiple Vitamin (MULTIVITAMIN) tablet Take 1 tablet by mouth daily.      . Omega-3 Fatty Acids (FISH OIL) 1200 MG CAPS Take 2 capsules by mouth 2 (two) times daily.    . pravastatin (PRAVACHOL) 40 MG tablet TAKE 1 TABLET BY MOUTH  DAILY 90 tablet 0  . sildenafil (VIAGRA) 100 MG tablet Take 1 tablet (100 mg total) by mouth daily as needed for erectile dysfunction. 30 tablet 3  . warfarin (COUMADIN) 10 MG tablet TAKE 1/2 TO 1 TABLET BY  MOUTH DAILY AS DIRECTED BY  ANTICOAGULATION CLINIC 90 tablet 1   No facility-administered medications prior to visit.    No orders of the defined types were placed in this encounter.       Objective:   Physical Exam  Vitals:  Vitals:   06/16/16 1142  BP: 126/84  Pulse: 62  SpO2: 99%  Weight: 129 kg (284 lb 6.4 oz)  Height: 5\' 10"  (1.778 m)    Constitutional/General:  Pleasant, well-nourished, well-developed, not in any distress,  Comfortably seating.  Well kempt  Body mass index is 40.81 kg/m. Wt Readings from Last 3 Encounters:  06/16/16 129 kg (284 lb 6.4 oz)  04/02/16 126.1 kg (278 lb)  12/27/15 122.6 kg (270 lb 4 oz)    Neck circumference:   HEENT: Pupils equal and reactive to light and accommodation. Anicteric sclerae. Normal nasal mucosa.   No oral  lesions,  mouth clear,  oropharynx clear, no postnasal drip. (-) Oral thrush. No dental caries.  Airway - Mallampati class IV. Crowded airway.   Neck: No masses. Midline trachea. No JVD, (-) LAD. (-) bruits appreciated.  Respiratory/Chest: Grossly normal chest. (-) deformity. (-) Accessory muscle use.  Symmetric expansion. (-) Tenderness on palpation.  Resonant on percussion.  Diminished BS on both lower lung  zones. (-) wheezing, crackles, rhonchi (-) egophony  Cardiovascular: Regular rate and  rhythm, heart sounds normal, no murmur or gallops, no peripheral edema  Gastrointestinal:  Normal bowel sounds. Soft, non-tender. No hepatosplenomegaly.  (-) masses.   Musculoskeletal:  Normal muscle tone. Normal gait.   Extremities: Grossly normal. (-) clubbing, cyanosis.  (-) edema  Skin: (-) rash,lesions seen.   Neurological/Psychiatric : alert, oriented to time, place, person. Normal mood and affect          Assessment &  Plan:  OSA (obstructive sleep apnea)  Pt  was diagnosed with OSA in 2004.   He was very symptomatic with snoring, witnessed apneas, gasping, choking.  Had unrefreshed sleep.  Hypersomnia affected his functionality.  He had a lab study which showed severe OSA with AHI 30.  He was placed on cpap 8 cm water.  Sx are better. Improved. Felt benefit of cpap.   He is on his current machine. He got his current machine in 2010.  Machine is not functional at times. It is not delivering enough pressure, it is noisy.  Not reliable. His machine has lost its luster.  Humidifier is not working well.  He is starting to be more symptomatic.  Hypersomnia affects his fxnality.  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  Recent download showed excelent compliance but machine can not do AHI.   Pt has been compliant with cpap but machine has lost its luster and it is malfunctioning at times.   Plan :  We extensively discussed the diagnosis, pathophysiology, and treatment options for Obstructive Sleep Apnea (OSA).   We discussed treatment options for OSA including CPAP, BiPaP, as well as surgical options and oral devices.   We will start patient on autocpap 5-15 cm water. He is currently on 8 cm water and no AHI can be doen from machine.  No sleep study unless insurance wants it.  If ever, will need a split night study.  He may actually need a  bipap. Plan for a 2 week and 4 week DL.  Needs DOT physical in 06/2016.   Patient was instructed to call the office if he/she has not received the PAP device in 1-2 weeks.  Patient was instructed to have mask, tubings, filter, reservoir cleaned at least once a week with soapy water.  Patient was instructed to call the office if he/she is having issues with the PAP device.    I advised patient to obtain sufficient amount of sleep --  7 to 8 hours at least in a 24 hr period.  Patient was advised to follow good sleep hygiene.  Patient was advised NOT to engage in activities requiring concentration and/or vigilance if he/she is and  sleepy.  Patient is NOT to drive if he/she is sleepy.       OBESITY, MORBID Weight reduction     Thank you very much for letting me participate in this patient's care. Please do not hesitate to give me a call if you have any questions or concerns regarding the treatment plan.   Patient will follow up with me in 8-10 weeks    J. Alexis FrockAngelo A. de Dios, MD 06/16/2016   12:57 PM Pulmonary and Critical Care Medicine [facility] Pager: [phone] Office: [phone], Fax: [phone]

## 2016-06-24 ENCOUNTER — Ambulatory Visit (INDEPENDENT_AMBULATORY_CARE_PROVIDER_SITE_OTHER): Payer: 59 | Admitting: *Deleted

## 2016-06-24 DIAGNOSIS — Z7901 Long term (current) use of anticoagulants: Secondary | ICD-10-CM | POA: Diagnosis not present

## 2016-06-24 DIAGNOSIS — I4891 Unspecified atrial fibrillation: Secondary | ICD-10-CM

## 2016-06-24 LAB — POCT INR: INR: 2.4

## 2016-06-27 NOTE — Progress Notes (Signed)
HPI: FU atrial fibrillation. Cardiac catheterization in January of 2001 showed normal LV function and normal coronary arteries. Nuclear study in June of 2011 showed soft tissue attenuation and no ischemia. Patient has been treated with flecainide for atrial fibrillation. He did have an exercise treadmill in September of 2011 and showed no exercise induced ventricular tachycardia. Last echocardiogram November 2015 showed normal LV function, trace mitral and tricuspid regurgitation. Since last seen, patient denies dyspnea, chest pain, palpitations, syncope or bleeding.    Current Outpatient Prescriptions  Medication Sig Dispense Refill  . amLODipine (NORVASC) 5 MG tablet TAKE 1 TABLET BY MOUTH  DAILY 90 tablet 0  . benazepril (LOTENSIN) 20 MG tablet TAKE 1 TABLET BY MOUTH  DAILY 90 tablet 0  . flecainide (TAMBOCOR) 100 MG tablet TAKE 1 TABLET BY MOUTH TWO  TIMES DAILY 180 tablet 0  . fluticasone (FLONASE) 50 MCG/ACT nasal spray Place 2 sprays into both nostrils daily. (Patient taking differently: Place 2 sprays into both nostrils daily as needed. ) 16 g 5  . hydrochlorothiazide (HYDRODIURIL) 12.5 MG tablet TAKE 1 TABLET BY MOUTH  DAILY 90 tablet 0  . metFORMIN (GLUCOPHAGE) 500 MG tablet Take 2 tablets (1,000 mg total) by mouth 2 (two) times daily with a meal. 360 tablet 1  . metoprolol succinate (TOPROL-XL) 50 MG 24 hr tablet TAKE 3 TABLETS BY MOUTH  DAILY 270 tablet 0  . Multiple Vitamin (MULTIVITAMIN) tablet Take 1 tablet by mouth daily.      . Omega-3 Fatty Acids (FISH OIL) 1200 MG CAPS Take 2 capsules by mouth 2 (two) times daily.    . pravastatin (PRAVACHOL) 40 MG tablet TAKE 1 TABLET BY MOUTH  DAILY 90 tablet 0  . sildenafil (VIAGRA) 100 MG tablet Take 1 tablet (100 mg total) by mouth daily as needed for erectile dysfunction. 30 tablet 3  . warfarin (COUMADIN) 10 MG tablet TAKE 1/2 TO 1 TABLET BY  MOUTH DAILY AS DIRECTED BY  ANTICOAGULATION CLINIC 90 tablet 1   No current  facility-administered medications for this visit.      Past Medical History:  Diagnosis Date  . DM II (diabetes mellitus, type II), controlled (HCC)   . Erectile dysfunction   . Hyperlipidemia   . Hypertension   . Obesity   . OSA (obstructive sleep apnea)   . Paroxysmal atrial fibrillation [facility](HCC)     Past Surgical History:  Procedure Laterality Date  . CARDIAC CATHETERIZATION    . FINGER SURGERY     left 1st finger    Social History   Social History  . Marital status: Married    Spouse name: N/A  . Number of children: N/A  . Years of education: N/A   Occupational History  . Not on file.   Social History Main Topics  . Smoking status: Former Smoker    Quit date: 05/26/1994  . Smokeless tobacco: Never Used     Comment: quit in 1995  . Alcohol use 3.0 oz/week    5 Cans of beer per week  . Drug use: No  . Sexual activity: Yes   Other Topics Concern  . Not on file   Social History Narrative  . No narrative on file    Family History  Problem Relation Age of Onset  . Heart attack Paternal Uncle   . Heart attack Maternal Grandmother   . Heart disease Mother   . Hypertension Mother   . Diabetes Mother   . Alcohol abuse  Father   . Cancer Neg Hx   . Early death Neg Hx   . Hyperlipidemia Neg Hx   . Kidney disease Neg Hx   . Stroke Neg Hx     ROS: no fevers or chills, productive cough, hemoptysis, dysphasia, odynophagia, melena, hematochezia, dysuria, hematuria, rash, seizure activity, orthopnea, PND, pedal edema, claudication. Remaining systems are negative.  Physical Exam: Well-developed obese in no acute distress.  Skin is warm and dry.  HEENT is normal.  Neck is supple.  Chest is clear to auscultation with normal expansion.  Cardiovascular exam is irregular Abdominal exam nontender or distended. No masses palpated. Extremities show no edema. neuro grossly intact  ECG-Atrial fibrillation at a rate of 93. No ST changes.  A/P  1 Paroxysmal atrial  fibrillation-patient in atrial fibrillation today. Continue flecainide, beta blocker and Coumadin. I discussed rate control versus rhythm control today. He would like to see if he can remain in sinus rhythm. INR December 19 2.8 and on January 30 2.4. I will repeat today and if he is therapeutic we will arrange cardioversion. If he develops recurrent atrial fibrillation in the future I would favor rate control as he is asymptomatic. Repeat echocardiogram.   2 hypertension-blood pressure controlled. Continue present medications.  3 hyperlipidemia-continue statin.  Olga Millers, MD

## 2016-07-04 ENCOUNTER — Encounter: Payer: Self-pay | Admitting: *Deleted

## 2016-07-04 ENCOUNTER — Encounter: Payer: Self-pay | Admitting: Cardiology

## 2016-07-04 ENCOUNTER — Ambulatory Visit (INDEPENDENT_AMBULATORY_CARE_PROVIDER_SITE_OTHER): Payer: 59 | Admitting: Cardiology

## 2016-07-04 VITALS — BP 120/84 | HR 93 | Ht 70.0 in | Wt 279.0 lb

## 2016-07-04 DIAGNOSIS — I10 Essential (primary) hypertension: Secondary | ICD-10-CM | POA: Diagnosis not present

## 2016-07-04 DIAGNOSIS — N522 Drug-induced erectile dysfunction: Secondary | ICD-10-CM | POA: Diagnosis not present

## 2016-07-04 DIAGNOSIS — I4891 Unspecified atrial fibrillation: Secondary | ICD-10-CM | POA: Diagnosis not present

## 2016-07-04 DIAGNOSIS — E7800 Pure hypercholesterolemia, unspecified: Secondary | ICD-10-CM

## 2016-07-04 MED ORDER — SILDENAFIL CITRATE 100 MG PO TABS: 100.0000 mg | ORAL_TABLET | Freq: Every day | ORAL | 3 refills | Status: DC | PRN

## 2016-07-04 NOTE — Patient Instructions (Signed)
Medication Instructions:   NO CHANGE  Testing/Procedures:  Your physician has requested that you have an echocardiogram. Echocardiography is a painless test that uses sound waves to create images of your heart. It provides your doctor with information about the size and shape of your heart and how well your heart's chambers and valves are working. This procedure takes approximately one hour. There are no restrictions for this procedure.   Your physician has recommended that you have a Cardioversion (DCCV). Electrical Cardioversion uses a jolt of electricity to your heart either through paddles or wired patches attached to your chest. This is a controlled, usually prescheduled, procedure. Defibrillation is done under light anesthesia in the hospital, and you usually go home the day of the procedure. This is done to get your heart back into a normal rhythm. You are not awake for the procedure. Please see the instruction sheet given to you today.    Follow-Up:  Your physician recommends that you schedule a follow-up appointment in: 3 MONTHS WITH DR Jens SomRENSHAW

## 2016-07-08 ENCOUNTER — Encounter (HOSPITAL_COMMUNITY)
Admission: RE | Disposition: A | Discharge status: Home / Self Care | Payer: Self-pay | Source: Ambulatory Visit | Attending: Cardiology

## 2016-07-08 ENCOUNTER — Ambulatory Visit (HOSPITAL_COMMUNITY)
Admission: RE | Admit: 2016-07-08 | Discharge: 2016-07-08 | Disposition: A | Discharge status: Home / Self Care | Payer: 59 | Source: Ambulatory Visit | Attending: Cardiology | Admitting: Cardiology

## 2016-07-08 DIAGNOSIS — Z539 Procedure and treatment not carried out, unspecified reason: Secondary | ICD-10-CM | POA: Insufficient documentation

## 2016-07-08 DIAGNOSIS — I4891 Unspecified atrial fibrillation: Secondary | ICD-10-CM

## 2016-07-08 SURGERY — CANCELLED PROCEDURE

## 2016-07-08 MED ORDER — SODIUM CHLORIDE 0.9 % IV SOLN: INTRAVENOUS | Status: DC

## 2016-07-08 NOTE — Progress Notes (Signed)
Patient came for cardioversion.  Sinus rhythm on monitor.  Dr Jens Somrenshaw at bedside, confirmed same.  Order received to cancel procedure today due to self conversion.  Patient is aware and agrees with plan.

## 2016-07-08 NOTE — H&P (Addendum)
Daniel Grant  07/04/2016 12:00 PM  Office Visit  MRN:  [id number]  Description: Male DOB: 1965-08-03 Provider: Lewayne BuntingBrian S Holland Nickson, MD Department: [facility]  Vitals   BP  120/84   Pulse  93   Ht  5\' 10"  (1.778 m)   Wt  279 lb (126.6 kg)   BMI  40.03 kg/m    Progress Notes   Daniel BuntingBrian S Brenetta Penny, MD at 07/04/2016 12:00 PM   Status: Signed         HPI: FU atrial fibrillation. Cardiac catheterization in January of 2001 showed normal LV function and normal coronary arteries. Nuclear study in June of 2011 showed soft tissue attenuation and no ischemia. Patient has been treated with flecainide for atrial fibrillation. He did have an exercise treadmill in September of 2011 and showed no exercise induced ventricular tachycardia. Last echocardiogram November 2015 showed normal LV function, trace mitral and tricuspid regurgitation. Since last seen, patient denies dyspnea, chest pain, palpitations, syncope or bleeding.          Current Outpatient Prescriptions  Medication Sig Dispense Refill  . amLODipine (NORVASC) 5 MG tablet TAKE 1 TABLET BY MOUTH  DAILY 90 tablet 0  . benazepril (LOTENSIN) 20 MG tablet TAKE 1 TABLET BY MOUTH  DAILY 90 tablet 0  . flecainide (TAMBOCOR) 100 MG tablet TAKE 1 TABLET BY MOUTH TWO  TIMES DAILY 180 tablet 0  . fluticasone (FLONASE) 50 MCG/ACT nasal spray Place 2 sprays into both nostrils daily. (Patient taking differently: Place 2 sprays into both nostrils daily as needed. ) 16 g 5  . hydrochlorothiazide (HYDRODIURIL) 12.5 MG tablet TAKE 1 TABLET BY MOUTH  DAILY 90 tablet 0  . metFORMIN (GLUCOPHAGE) 500 MG tablet Take 2 tablets (1,000 mg total) by mouth 2 (two) times daily with a meal. 360 tablet 1  . metoprolol succinate (TOPROL-XL) 50 MG 24 hr tablet TAKE 3 TABLETS BY MOUTH  DAILY 270 tablet 0  . Multiple Vitamin (MULTIVITAMIN) tablet Take 1 tablet by mouth daily.      . Omega-3 Fatty Acids (FISH OIL) 1200 MG CAPS Take 2 capsules by mouth 2 (two) times  daily.    . pravastatin (PRAVACHOL) 40 MG tablet TAKE 1 TABLET BY MOUTH  DAILY 90 tablet 0  . sildenafil (VIAGRA) 100 MG tablet Take 1 tablet (100 mg total) by mouth daily as needed for erectile dysfunction. 30 tablet 3  . warfarin (COUMADIN) 10 MG tablet TAKE 1/2 TO 1 TABLET BY  MOUTH DAILY AS DIRECTED BY  ANTICOAGULATION CLINIC 90 tablet 1   No current facility-administered medications for this visit.          Past Medical History:  Diagnosis Date  . DM II (diabetes mellitus, type II), controlled (HCC)   . Erectile dysfunction   . Hyperlipidemia   . Hypertension   . Obesity   . OSA (obstructive sleep apnea)   . Paroxysmal atrial fibrillation [facility](HCC)          Past Surgical History:  Procedure Laterality Date  . CARDIAC CATHETERIZATION    . FINGER SURGERY     left 1st finger    Social History        Social History  . Marital status: Married    Spouse name: N/A  . Number of children: N/A  . Years of education: N/A      Occupational History  . Not on file.         Social History Main Topics  . Smoking status: Former  Smoker    Quit date: 05/26/1994  . Smokeless tobacco: Never Used     Comment: quit in 1995  . Alcohol use 3.0 oz/week    5 Cans of beer per week  . Drug use: No  . Sexual activity: Yes       Other Topics Concern  . Not on file      Social History Narrative  . No narrative on file         Family History  Problem Relation Age of Onset  . Heart attack Paternal Uncle   . Heart attack Maternal Grandmother   . Heart disease Mother   . Hypertension Mother   . Diabetes Mother   . Alcohol abuse Father   . Cancer Neg Hx   . Early death Neg Hx   . Hyperlipidemia Neg Hx   . Kidney disease Neg Hx   . Stroke Neg Hx     ROS: no fevers or chills, productive cough, hemoptysis, dysphasia, odynophagia, melena, hematochezia, dysuria, hematuria, rash, seizure activity, orthopnea, PND, pedal edema,  claudication. Remaining systems are negative.  Physical Exam: Well-developed obese in no acute distress.  Skin is warm and dry.  HEENT is normal.  Neck is supple.  Chest is clear to auscultation with normal expansion.  Cardiovascular exam is irregular Abdominal exam nontender or distended. No masses palpated. Extremities show no edema. neuro grossly intact  ECG-Atrial fibrillation at a rate of 93. No ST changes.  A/P  1 Paroxysmal atrial fibrillation-patient in atrial fibrillation today. Continue flecainide, beta blocker and Coumadin. I discussed rate control versus rhythm control today. He would like to see if he can remain in sinus rhythm. INR December 19 2.8 and on January 30 2.4. I will repeat today and if he is therapeutic we will arrange cardioversion. If he develops recurrent atrial fibrillation in the future I would favor rate control as he is asymptomatic. Repeat echocardiogram.   2 hypertension-blood pressure controlled. Continue present medications.  3 hyperlipidemia-continue statin.  Daniel Millers, MD       For DCCV; no changes Daniel Grant  Addendum: pt in sinus on telemetry; procedure cancelled. Daniel Grant

## 2016-07-09 ENCOUNTER — Other Ambulatory Visit: Payer: Self-pay | Admitting: Cardiology

## 2016-07-09 ENCOUNTER — Encounter: Payer: Self-pay | Admitting: Cardiology

## 2016-07-09 ENCOUNTER — Other Ambulatory Visit: Payer: Self-pay | Admitting: *Deleted

## 2016-07-09 DIAGNOSIS — N522 Drug-induced erectile dysfunction: Secondary | ICD-10-CM

## 2016-07-09 MED ORDER — PRAVASTATIN SODIUM 40 MG PO TABS
40.0000 mg | ORAL_TABLET | Freq: Every day | ORAL | 3 refills | Status: DC
Start: 2016-07-09 — End: 2017-08-11

## 2016-07-09 MED ORDER — SILDENAFIL CITRATE 100 MG PO TABS: 100.0000 mg | ORAL_TABLET | Freq: Every day | ORAL | 3 refills | Status: DC | PRN

## 2016-07-09 MED ORDER — FLECAINIDE ACETATE 100 MG PO TABS: 100.0000 mg | ORAL_TABLET | Freq: Two times a day (BID) | ORAL | 3 refills | Status: DC

## 2016-07-09 MED ORDER — BENAZEPRIL HCL 20 MG PO TABS: 20.0000 mg | ORAL_TABLET | Freq: Every day | ORAL | 3 refills | Status: DC

## 2016-07-09 MED ORDER — METOPROLOL SUCCINATE ER 50 MG PO TB24: 150.0000 mg | ORAL_TABLET | Freq: Every day | ORAL | 3 refills | Status: DC

## 2016-07-09 MED ORDER — HYDROCHLOROTHIAZIDE 12.5 MG PO TABS
12.5000 mg | ORAL_TABLET | Freq: Every day | ORAL | 3 refills | Status: DC
Start: 2016-07-09 — End: 2017-08-11

## 2016-07-09 MED ORDER — AMLODIPINE BESYLATE 5 MG PO TABS: 5.0000 mg | ORAL_TABLET | Freq: Every day | ORAL | 3 refills | Status: DC

## 2016-07-09 NOTE — Telephone Encounter (Signed)
Pt says he was here on Friday and said you were supposed to have called in his medicine.He said it was his blood pressure medicine,heart medicine and Viagra.He did not know the name of the blood pressure and heart medicine,Please call this in today to [facility] for 90 days supply.

## 2016-07-09 NOTE — Telephone Encounter (Signed)
This encounter was created in error - please disregard.

## 2016-07-09 NOTE — Telephone Encounter (Signed)
Pravastatin, metoprolol succinate, hctz, amlodipine, benazpril refilled today for 90 day supply + refills to [facility] Warfarin refilled on 06/04/2016  sildenafil (VIAGRA) 100 MG tablet 30 tablet 3 07/04/2016 12/24/2020   Sig - Route: Take 1 tablet (100 mg total) by mouth daily as needed for erectile dysfunction. - Oral   E-Prescribing Status: Receipt confirmed by pharmacy (07/04/2016 12:24 PM EST)   Associated Diagnoses   Drug-induced erectile dysfunction     Pharmacy   [id number] - [place] ([place]), [place] - [address]    Flecainide refilled to [facility]  LM for patient that cardiac meds were refilled to [facility] & viagra to local pharmacy

## 2016-07-09 NOTE — Telephone Encounter (Signed)
Refilled approved by D. Betsey HolidayMathis, RN

## 2016-07-09 NOTE — Telephone Encounter (Signed)
New message   Pt calling about medication.  *STAT* If patient is at the pharmacy, call can be transferred to refill team.   1. Which medications need to be refilled? (please list name of each medication and dose if known) heart medicine, blood pressure and viagra Asked pt for the name of medications-he would not give the name.  2. Which pharmacy/location (including street and city if local pharmacy) is medication to be sent to? [facility]  3. Do they need a 30 day or 90 day supply? 90 day

## 2016-07-09 NOTE — Telephone Encounter (Signed)
New message    Pt c/o medication issue:  1. Name of Medication: viagra  2. How are you currently taking this medication (dosage and times per day)?   3. Are you having a reaction (difficulty breathing--STAT)? no  4. What is your medication issue? Medication sent to wrong pharmacy-he wanted this sent [facility] also.

## 2016-07-10 ENCOUNTER — Telehealth: Payer: 59 | Admitting: Physician Assistant

## 2016-07-10 DIAGNOSIS — N50819 Testicular pain, unspecified: Secondary | ICD-10-CM

## 2016-07-10 DIAGNOSIS — M549 Dorsalgia, unspecified: Secondary | ICD-10-CM

## 2016-07-10 NOTE — Progress Notes (Signed)
Based on what you shared with me it looks like you have a  condition that should be evaluated in a face to face office visit.  You are mentioning testicular pain/soreness with your symptoms. As such I recommend a complete assessment in office so correct diagnosis and treatment can be given. NOTE: Even if you have entered your credit card information for this eVisit, you will not be charged.   If you are having a true medical emergency please call 911.  If you need an urgent face to face visit, [place] has four urgent care centers for your convenience.  If you need care fast and have a high deductible or no insurance consider:   [email or web address]  [phone]  [address], [address] [place], [place] [address] 8 am to 8 pm Monday-Friday 10 am to 4 pm Saturday-Sunday   The following sites will take your  insurance:    . [facility]  [phone] Get Driving Directions Find a Provider at this Location  [address] [place], [place] [address] . 10 am to 8 pm Monday-Friday . 12 pm to 8 pm Saturday-Sunday   . [facility]  [phone] Get Driving Directions Find a Provider at this Location  [address], [address] [place], [place] [address] . 8 am to 8 pm Monday-Friday . 9 am to 6 pm Saturday . 11 am to 6 pm Sunday   . [facility] [place]  [phone] Get Driving Directions  [address] [place], [place] [address] . 8 am to 8 pm Monday-Friday . 8 am to 4 pm Saturday-Sunday   Your e-visit answers were reviewed by a board certified advanced clinical practitioner to complete your personal care plan.  Thank you for using e-Visits.

## 2016-07-16 ENCOUNTER — Encounter: Payer: Self-pay | Admitting: *Deleted

## 2016-07-22 ENCOUNTER — Ambulatory Visit (HOSPITAL_COMMUNITY): Payer: 59 | Attending: Cardiovascular Disease

## 2016-07-22 ENCOUNTER — Other Ambulatory Visit: Payer: Self-pay

## 2016-07-22 DIAGNOSIS — Z87891 Personal history of nicotine dependence: Secondary | ICD-10-CM | POA: Diagnosis not present

## 2016-07-22 DIAGNOSIS — I4891 Unspecified atrial fibrillation: Secondary | ICD-10-CM | POA: Diagnosis not present

## 2016-07-22 DIAGNOSIS — E785 Hyperlipidemia, unspecified: Secondary | ICD-10-CM | POA: Insufficient documentation

## 2016-07-22 DIAGNOSIS — I071 Rheumatic tricuspid insufficiency: Secondary | ICD-10-CM | POA: Diagnosis not present

## 2016-07-22 DIAGNOSIS — E119 Type 2 diabetes mellitus without complications: Secondary | ICD-10-CM | POA: Insufficient documentation

## 2016-07-22 DIAGNOSIS — I10 Essential (primary) hypertension: Secondary | ICD-10-CM | POA: Diagnosis not present

## 2016-07-22 DIAGNOSIS — H2513 Age-related nuclear cataract, bilateral: Secondary | ICD-10-CM | POA: Diagnosis not present

## 2016-07-22 LAB — HM DIABETES EYE EXAM

## 2016-07-28 ENCOUNTER — Ambulatory Visit (INDEPENDENT_AMBULATORY_CARE_PROVIDER_SITE_OTHER): Payer: 59 | Admitting: Family Medicine

## 2016-07-28 ENCOUNTER — Telehealth: Payer: Self-pay | Admitting: Pulmonary Disease

## 2016-07-28 ENCOUNTER — Encounter: Payer: Self-pay | Admitting: Family Medicine

## 2016-07-28 DIAGNOSIS — E119 Type 2 diabetes mellitus without complications: Secondary | ICD-10-CM

## 2016-07-28 LAB — BASIC METABOLIC PANEL
BUN: 19 mg/dL (ref 6–23)
CO2: 28 mEq/L (ref 19–32)
Calcium: 9.6 mg/dL (ref 8.4–10.5)
Chloride: 102 mEq/L (ref 96–112)
Creatinine, Ser: 0.88 mg/dL (ref 0.40–1.50)
GFR: 116.53 mL/min (ref >60.00)
Glucose, Bld: 114 mg/dL — ABNORMAL HIGH (ref 70–99)
Potassium: 4.6 mEq/L (ref 3.5–5.1)
Sodium: 140 mEq/L (ref 135–145)

## 2016-07-28 LAB — HEMOGLOBIN A1C: Hgb A1c MFr Bld: 7 % — ABNORMAL HIGH (ref 4.6–6.5)

## 2016-07-28 MED ORDER — METFORMIN HCL 500 MG PO TABS: 1000.0000 mg | ORAL_TABLET | Freq: Two times a day (BID) | ORAL | 1 refills | Status: DC

## 2016-07-28 NOTE — Telephone Encounter (Signed)
Spoke with Marchelle FolksAmanda at [facility]. This order was placed on 06/16/16. Per Marchelle FolksAmanda, they are still waiting on prior authorization from the pt's insurance. I called and spoke with pt and made him aware of this. He states that he will contact [facility] later this week to follow up with them on this. Nothing further was needed.

## 2016-07-28 NOTE — Patient Instructions (Signed)
Follow up in 3-4 months to recheck diabetes and cholesterol We'll notify you of your lab results and make any changes if needed Keep up the good work on healthy diet and regular exercise- you can do it!!! Call Dr Christene Slatesde Dios and let him know that you don't have your CPAP yet [phone] Call with any questions or concerns Happy Early Birthday!!!

## 2016-07-28 NOTE — Progress Notes (Signed)
   Subjective:    Patient ID: Daniel Grant, male    DOB: 01-23-1966, 51 y.o.   MRN: [id number]  HPI DM- chronic problem.  UTD on eye exam, foot exam.  On ACE for renal protection.  Pt has gained 3 lbs since last visit.  Currently on Metformin 500mg  BID.  Home CBGs 90s-130s.  Denies symptomatic lows.  No numbness/tingling of hands/feet.  No CP, SOB, HAs, visual changes, edema.  Pt is walking regularly.   Review of Systems For ROS see HPI     Objective:   Physical Exam  Constitutional: He is oriented to person, place, and time. He appears well-developed and well-nourished. No distress.  HENT:  Head: Normocephalic and atraumatic.  Eyes: Conjunctivae and EOM are normal. Pupils are equal, round, and reactive to light.  Neck: Normal range of motion. Neck supple. No thyromegaly present.  Cardiovascular: Normal rate, normal heart sounds and intact distal pulses.   No murmur heard. Irregularly irregular S1/S2  Pulmonary/Chest: Effort normal and breath sounds normal. No respiratory distress.  Abdominal: Soft. Bowel sounds are normal. He exhibits no distension.  Musculoskeletal: He exhibits no edema.  Lymphadenopathy:    He has no cervical adenopathy.  Neurological: He is alert and oriented to person, place, and time. No cranial nerve deficit.  Skin: Skin is warm and dry.  Psychiatric: He has a normal mood and affect. His behavior is normal.  Vitals reviewed.         Assessment & Plan:

## 2016-07-28 NOTE — Progress Notes (Signed)
Pre visit review using our clinic review tool, if applicable. No additional management support is needed unless otherwise documented below in the visit note. 

## 2016-07-28 NOTE — Assessment & Plan Note (Signed)
Pt's home CBGs are well controlled.  UTD on foot exam, eye exam.  On ACE for renal protection.  Stressed need for healthy diet and regular exercise.  Check labs.  Adjust meds prn.

## 2016-07-29 ENCOUNTER — Encounter: Payer: Self-pay | Admitting: General Practice

## 2016-08-05 ENCOUNTER — Encounter: Payer: Self-pay | Admitting: General Practice

## 2016-08-06 ENCOUNTER — Ambulatory Visit: Payer: 59 | Admitting: Pulmonary Disease

## 2016-08-07 ENCOUNTER — Ambulatory Visit (INDEPENDENT_AMBULATORY_CARE_PROVIDER_SITE_OTHER): Payer: 59 | Admitting: Pharmacist

## 2016-08-07 DIAGNOSIS — I4891 Unspecified atrial fibrillation: Secondary | ICD-10-CM

## 2016-08-07 DIAGNOSIS — Z7901 Long term (current) use of anticoagulants: Secondary | ICD-10-CM | POA: Diagnosis not present

## 2016-08-07 LAB — POCT INR: INR: 2.3

## 2016-09-15 ENCOUNTER — Ambulatory Visit (INDEPENDENT_AMBULATORY_CARE_PROVIDER_SITE_OTHER): Payer: 59 | Admitting: *Deleted

## 2016-09-15 DIAGNOSIS — Z7901 Long term (current) use of anticoagulants: Secondary | ICD-10-CM

## 2016-09-15 DIAGNOSIS — I4891 Unspecified atrial fibrillation: Secondary | ICD-10-CM | POA: Diagnosis not present

## 2016-09-15 LAB — POCT INR: INR: 2.5

## 2016-09-29 ENCOUNTER — Encounter: Payer: Self-pay | Admitting: Cardiology

## 2016-10-08 NOTE — Progress Notes (Signed)
HPI: FU atrial fibrillation. Cardiac catheterization in January of 2001 showed normal LV function and normal coronary arteries. Nuclear study in June of 2011 showed soft tissue attenuation and no ischemia. Patient has been treated with flecainide for atrial fibrillation. He did have an exercise treadmill in September of 2011 that showed no exercise induced ventricular tachycardia. Last echocardiogram February 2018 showed normal LV systolic function, moderate diastolic dysfunction, mild right ventricular enlargement. Patient was scheduled for cardioversion 2/18 but upon arrival was found to be in sinus rhythm. Since last seen, the patient denies any dyspnea on exertion, orthopnea, PND, pedal edema, palpitations, syncope or chest pain.   Current Outpatient Prescriptions  Medication Sig Dispense Refill  . amLODipine (NORVASC) 5 MG tablet Take 1 tablet (5 mg total) by mouth daily. 90 tablet 3  . benazepril (LOTENSIN) 20 MG tablet Take 1 tablet (20 mg total) by mouth daily. 90 tablet 3  . flecainide (TAMBOCOR) 100 MG tablet Take 1 tablet (100 mg total) by mouth 2 (two) times daily. 180 tablet 3  . fluticasone (FLONASE) 50 MCG/ACT nasal spray Place 2 sprays into both nostrils daily. (Patient taking differently: Place 2 sprays into both nostrils daily as needed. ) 16 g 5  . hydrochlorothiazide (HYDRODIURIL) 12.5 MG tablet Take 1 tablet (12.5 mg total) by mouth daily. 90 tablet 3  . metFORMIN (GLUCOPHAGE) 500 MG tablet Take 2 tablets (1,000 mg total) by mouth 2 (two) times daily with a meal. 360 tablet 1  . metoprolol succinate (TOPROL-XL) 50 MG 24 hr tablet Take 3 tablets (150 mg total) by mouth daily. Take with or immediately following a meal. 270 tablet 3  . Multiple Vitamin (MULTIVITAMIN) tablet Take 1 tablet by mouth daily.      . Omega-3 Fatty Acids (FISH OIL) 1200 MG CAPS Take 2 capsules by mouth 2 (two) times daily.    . pravastatin (PRAVACHOL) 40 MG tablet Take 1 tablet (40 mg total) by  mouth daily. 90 tablet 3  . sildenafil (VIAGRA) 100 MG tablet Take 1 tablet (100 mg total) by mouth daily as needed for erectile dysfunction. 30 tablet 3  . warfarin (COUMADIN) 10 MG tablet TAKE 1/2 TO 1 TABLET BY  MOUTH DAILY AS DIRECTED BY  ANTICOAGULATION CLINIC 90 tablet 1   No current facility-administered medications for this visit.      Past Medical History:  Diagnosis Date  . DM II (diabetes mellitus, type II), controlled (HCC)   . Erectile dysfunction   . Hyperlipidemia   . Hypertension   . Obesity   . OSA (obstructive sleep apnea)   . Paroxysmal atrial fibrillation [facility](HCC)     Past Surgical History:  Procedure Laterality Date  . CARDIAC CATHETERIZATION    . FINGER SURGERY     left 1st finger    Social History   Social History  . Marital status: Married    Spouse name: N/A  . Number of children: N/A  . Years of education: N/A   Occupational History  . Not on file.   Social History Main Topics  . Smoking status: Former Smoker    Quit date: 05/26/1994  . Smokeless tobacco: Never Used     Comment: quit in 1995  . Alcohol use 3.0 oz/week    5 Cans of beer per week  . Drug use: No  . Sexual activity: Yes   Other Topics Concern  . Not on file   Social History Narrative  . No narrative on file  Family History  Problem Relation Age of Onset  . Heart attack Paternal Uncle   . Heart attack Maternal Grandmother   . Heart disease Mother   . Hypertension Mother   . Diabetes Mother   . Alcohol abuse Father   . Cancer Neg Hx   . Early death Neg Hx   . Hyperlipidemia Neg Hx   . Kidney disease Neg Hx   . Stroke Neg Hx     ROS: no fevers or chills, productive cough, hemoptysis, dysphasia, odynophagia, melena, hematochezia, dysuria, hematuria, rash, seizure activity, orthopnea, PND, pedal edema, claudication. Remaining systems are negative.  Physical Exam: Well-developed obese in no acute distress.  Skin is warm and dry.  HEENT is normal.  Neck is  supple. No bruits Chest is clear to auscultation with normal expansion.  Cardiovascular exam is regular rate and rhythm.  Abdominal exam nontender or distended. No masses palpated. Extremities show no edema. neuro grossly intact   A/P  1 Paroxysmal atrial fibrillation-patient is in sinus rhythm on examination today. Continue flecainide, beta blocker and Coumadin.  2 hypertension-blood pressure controlled. Continue present medications.  3 hyperlipidemia-continue statin.  Olga Millers, MD

## 2016-10-14 ENCOUNTER — Ambulatory Visit (INDEPENDENT_AMBULATORY_CARE_PROVIDER_SITE_OTHER): Payer: 59 | Admitting: Cardiology

## 2016-10-14 ENCOUNTER — Encounter: Payer: Self-pay | Admitting: Cardiology

## 2016-10-14 VITALS — BP 124/80 | HR 72 | Ht 70.0 in | Wt 283.0 lb

## 2016-10-14 DIAGNOSIS — I10 Essential (primary) hypertension: Secondary | ICD-10-CM

## 2016-10-14 DIAGNOSIS — E7800 Pure hypercholesterolemia, unspecified: Secondary | ICD-10-CM

## 2016-10-14 DIAGNOSIS — I480 Paroxysmal atrial fibrillation: Secondary | ICD-10-CM | POA: Diagnosis not present

## 2016-10-14 NOTE — Patient Instructions (Signed)
Your physician wants you to follow-up in: 6 MONTHS WITH DR CRENSHAW You will receive a reminder letter in the mail two months in advance. If you don't receive a letter, please call our office to schedule the follow-up appointment.   If you need a refill on your cardiac medications before your next appointment, please call your pharmacy.  

## 2016-10-27 ENCOUNTER — Ambulatory Visit (INDEPENDENT_AMBULATORY_CARE_PROVIDER_SITE_OTHER): Payer: 59 | Admitting: *Deleted

## 2016-10-27 DIAGNOSIS — Z7901 Long term (current) use of anticoagulants: Secondary | ICD-10-CM

## 2016-10-27 DIAGNOSIS — I4891 Unspecified atrial fibrillation: Secondary | ICD-10-CM | POA: Diagnosis not present

## 2016-10-27 LAB — POCT INR: INR: 4

## 2016-11-17 ENCOUNTER — Ambulatory Visit (INDEPENDENT_AMBULATORY_CARE_PROVIDER_SITE_OTHER): Payer: 59 | Admitting: Pharmacist

## 2016-11-17 DIAGNOSIS — I4891 Unspecified atrial fibrillation: Secondary | ICD-10-CM

## 2016-11-17 DIAGNOSIS — Z7901 Long term (current) use of anticoagulants: Secondary | ICD-10-CM

## 2016-11-17 LAB — POCT INR: INR: 3

## 2016-11-24 ENCOUNTER — Ambulatory Visit: Payer: 59 | Admitting: Family Medicine

## 2016-12-03 ENCOUNTER — Encounter: Payer: Self-pay | Admitting: Family Medicine

## 2016-12-03 ENCOUNTER — Ambulatory Visit (INDEPENDENT_AMBULATORY_CARE_PROVIDER_SITE_OTHER): Payer: 59 | Admitting: Family Medicine

## 2016-12-03 ENCOUNTER — Other Ambulatory Visit: Payer: Self-pay | Admitting: Cardiology

## 2016-12-03 VITALS — BP 122/80 | HR 63 | Temp 98.0°F | Resp 16 | Ht 70.0 in | Wt 280.2 lb

## 2016-12-03 DIAGNOSIS — I10 Essential (primary) hypertension: Secondary | ICD-10-CM

## 2016-12-03 DIAGNOSIS — E785 Hyperlipidemia, unspecified: Secondary | ICD-10-CM

## 2016-12-03 DIAGNOSIS — E119 Type 2 diabetes mellitus without complications: Secondary | ICD-10-CM | POA: Diagnosis not present

## 2016-12-03 LAB — HEPATIC FUNCTION PANEL
ALT: 25 U/L (ref 0–53)
AST: 14 U/L (ref 0–37)
Albumin: 4 g/dL (ref 3.5–5.2)
Alkaline Phosphatase: 86 U/L (ref 39–117)
Bilirubin, Direct: 0.1 mg/dL (ref 0.0–0.3)
Total Bilirubin: 0.6 mg/dL (ref 0.2–1.2)
Total Protein: 6.3 g/dL (ref 6.0–8.3)

## 2016-12-03 LAB — CBC WITH DIFFERENTIAL/PLATELET
Basophils Absolute: 0.1 10*3/uL (ref 0.0–0.1)
Basophils Relative: 0.8 % (ref 0.0–3.0)
Eosinophils Absolute: 0.3 10*3/uL (ref 0.0–0.7)
Eosinophils Relative: 3.5 % (ref 0.0–5.0)
HCT: 41.1 % (ref 39.0–52.0)
Hemoglobin: 14 g/dL (ref 13.0–17.0)
Lymphocytes Relative: 28.7 % (ref 12.0–46.0)
Lymphs Abs: 2.8 10*3/uL (ref 0.7–4.0)
MCHC: 34 g/dL (ref 30.0–36.0)
MCV: 83.3 fl (ref 78.0–100.0)
Monocytes Absolute: 0.8 10*3/uL (ref 0.1–1.0)
Monocytes Relative: 8.3 % (ref 3.0–12.0)
Neutro Abs: 5.7 10*3/uL (ref 1.4–7.7)
Neutrophils Relative %: 58.7 % (ref 43.0–77.0)
Platelets: 220 10*3/uL (ref 150.0–400.0)
RBC: 4.93 Mil/uL (ref 4.22–5.81)
RDW: 13.4 % (ref 11.5–15.5)
WBC: 9.7 10*3/uL (ref 4.0–10.5)

## 2016-12-03 LAB — LIPID PANEL
Cholesterol: 127 mg/dL (ref 0–200)
HDL: 37.4 mg/dL — ABNORMAL LOW (ref >39.00)
LDL Cholesterol: 57 mg/dL (ref 0–99)
NonHDL: 89.29
Total CHOL/HDL Ratio: 3
Triglycerides: 163 mg/dL — ABNORMAL HIGH (ref 0.0–149.0)
VLDL: 32.6 mg/dL (ref 0.0–40.0)

## 2016-12-03 LAB — HEMOGLOBIN A1C: Hgb A1c MFr Bld: 7.2 % — ABNORMAL HIGH (ref 4.6–6.5)

## 2016-12-03 LAB — BASIC METABOLIC PANEL
BUN: 23 mg/dL (ref 6–23)
CO2: 27 mEq/L (ref 19–32)
Calcium: 9.2 mg/dL (ref 8.4–10.5)
Chloride: 103 mEq/L (ref 96–112)
Creatinine, Ser: 0.9 mg/dL (ref 0.40–1.50)
GFR: 113.4 mL/min (ref >60.00)
Glucose, Bld: 115 mg/dL — ABNORMAL HIGH (ref 70–99)
Potassium: 3.8 mEq/L (ref 3.5–5.1)
Sodium: 138 mEq/L (ref 135–145)

## 2016-12-03 LAB — TSH: TSH: 0.76 u[IU]/mL (ref 0.35–4.50)

## 2016-12-03 MED ORDER — METFORMIN HCL ER 500 MG PO TB24: ORAL_TABLET | ORAL | 1 refills | Status: DC

## 2016-12-03 NOTE — Progress Notes (Signed)
   Subjective:    Patient ID: Daniel Grant, male    DOB: Aug 21, 1965, 51 y.o.   MRN: [id number]  HPI DM- chronic problem, on Metformin twice daily.  UTD on foot exam, eye exam, on ACE for renal protection.  Pt is down 3 lbs.  Pt is having increased diarrhea since switching to short acting Metformin.  Denies symptomatic lows.  No numbness/tingling of hands/feet.  Hyperlipidemia- chronic problem, on Pravastatin.  No abd pain, N/V, myalgias.  HTN- chronic problem, on Amlodipine, Benazepril, HCTZ, Metoprolol w/ good control.  No CP, SOB, HAs, visual changes, edema.   Review of Systems For ROS see HPI     Objective:   Physical Exam  Constitutional: He is oriented to person, place, and time. He appears well-developed and well-nourished. No distress.  obese  HENT:  Head: Normocephalic and atraumatic.  Eyes: Conjunctivae and EOM are normal. Pupils are equal, round, and reactive to light.  Neck: Normal range of motion. Neck supple. No thyromegaly present.  Cardiovascular: Normal rate, regular rhythm, normal heart sounds and intact distal pulses.   No murmur heard. Pulmonary/Chest: Effort normal and breath sounds normal. No respiratory distress.  Abdominal: Soft. Bowel sounds are normal. He exhibits no distension.  Musculoskeletal: He exhibits no edema.  Lymphadenopathy:    He has no cervical adenopathy.  Neurological: He is alert and oriented to person, place, and time. No cranial nerve deficit.  Skin: Skin is warm and dry.  Psychiatric: He has a normal mood and affect. His behavior is normal.  Vitals reviewed.         Assessment & Plan:

## 2016-12-03 NOTE — Assessment & Plan Note (Signed)
Chronic problem.  Well controlled today.  Asymptomatic.  Check labs.  No anticipated med changes.  Will follow. 

## 2016-12-03 NOTE — Patient Instructions (Signed)
Schedule your complete physical after 04/02/17 We'll notify you of your lab results and make any changes if needed Continue to work on healthy diet and regular exercise- you can do it!!! Call with any questions or concerns Have a great summer and enjoy your vacation!!!

## 2016-12-03 NOTE — Assessment & Plan Note (Signed)
Chronic problem.  Tolerating statin w/o difficulty.  Check labs.  Adjust meds prn  

## 2016-12-03 NOTE — Progress Notes (Signed)
Pre visit review using our clinic review tool, if applicable. No additional management support is needed unless otherwise documented below in the visit note. 

## 2016-12-03 NOTE — Assessment & Plan Note (Signed)
Chronic problem.  Switch back to extended release metformin due to diarrhea.  UTD on foot exam, eye exam, and on ACE for renal protection.  Stressed need for healthy diet and regular exercise.  Check labs.  Adjust meds prn

## 2016-12-08 ENCOUNTER — Encounter: Payer: Self-pay | Admitting: Family Medicine

## 2016-12-30 ENCOUNTER — Ambulatory Visit (INDEPENDENT_AMBULATORY_CARE_PROVIDER_SITE_OTHER): Payer: 59 | Admitting: Pharmacist

## 2016-12-30 DIAGNOSIS — I4891 Unspecified atrial fibrillation: Secondary | ICD-10-CM

## 2016-12-30 DIAGNOSIS — Z7901 Long term (current) use of anticoagulants: Secondary | ICD-10-CM | POA: Diagnosis not present

## 2016-12-30 LAB — POCT INR: INR: 3.5

## 2017-02-03 ENCOUNTER — Ambulatory Visit (INDEPENDENT_AMBULATORY_CARE_PROVIDER_SITE_OTHER): Payer: 59 | Admitting: *Deleted

## 2017-02-03 DIAGNOSIS — I4891 Unspecified atrial fibrillation: Secondary | ICD-10-CM

## 2017-02-03 DIAGNOSIS — Z7901 Long term (current) use of anticoagulants: Secondary | ICD-10-CM | POA: Diagnosis not present

## 2017-02-03 DIAGNOSIS — Z5181 Encounter for therapeutic drug level monitoring: Secondary | ICD-10-CM | POA: Diagnosis not present

## 2017-02-03 LAB — POCT INR: INR: 2.9

## 2017-02-16 ENCOUNTER — Encounter: Payer: Self-pay | Admitting: Family Medicine

## 2017-02-20 ENCOUNTER — Encounter: Payer: Self-pay | Admitting: Family Medicine

## 2017-02-20 MED ORDER — METFORMIN HCL ER 500 MG PO TB24: ORAL_TABLET | ORAL | 1 refills | Status: DC

## 2017-03-20 ENCOUNTER — Ambulatory Visit (INDEPENDENT_AMBULATORY_CARE_PROVIDER_SITE_OTHER): Payer: 59 | Admitting: *Deleted

## 2017-03-20 DIAGNOSIS — Z23 Encounter for immunization: Secondary | ICD-10-CM

## 2017-03-20 DIAGNOSIS — I4891 Unspecified atrial fibrillation: Secondary | ICD-10-CM | POA: Diagnosis not present

## 2017-03-20 DIAGNOSIS — Z7901 Long term (current) use of anticoagulants: Secondary | ICD-10-CM | POA: Diagnosis not present

## 2017-03-20 LAB — POCT INR: INR: 2.3

## 2017-04-03 ENCOUNTER — Encounter: Payer: 59 | Admitting: Family Medicine

## 2017-04-28 ENCOUNTER — Ambulatory Visit (INDEPENDENT_AMBULATORY_CARE_PROVIDER_SITE_OTHER): Payer: 59 | Admitting: Pharmacist

## 2017-04-28 DIAGNOSIS — Z7901 Long term (current) use of anticoagulants: Secondary | ICD-10-CM | POA: Diagnosis not present

## 2017-04-28 DIAGNOSIS — I4891 Unspecified atrial fibrillation: Secondary | ICD-10-CM | POA: Diagnosis not present

## 2017-04-28 LAB — POCT INR: INR: 2.5

## 2017-04-28 NOTE — Patient Instructions (Signed)
Continue taking a 1/2 tablet (5mg ) every day. Recheck in 6 weeks. Call our office if placed on any new medications or if you have any concerns [phone].

## 2017-06-09 ENCOUNTER — Ambulatory Visit (INDEPENDENT_AMBULATORY_CARE_PROVIDER_SITE_OTHER): Payer: 59

## 2017-06-09 DIAGNOSIS — Z7901 Long term (current) use of anticoagulants: Secondary | ICD-10-CM

## 2017-06-09 DIAGNOSIS — I4891 Unspecified atrial fibrillation: Secondary | ICD-10-CM

## 2017-06-09 LAB — POCT INR: INR: 1.8

## 2017-06-09 NOTE — Patient Instructions (Signed)
Description   Take 1 tablet today, then resume 1/2 tablet (5mg ) every day. Recheck in 4 weeks. Call our office if placed on any new medications or if you have any concerns [phone].

## 2017-07-07 ENCOUNTER — Ambulatory Visit: Payer: 59 | Admitting: *Deleted

## 2017-07-07 DIAGNOSIS — I4891 Unspecified atrial fibrillation: Secondary | ICD-10-CM | POA: Diagnosis not present

## 2017-07-07 DIAGNOSIS — Z7901 Long term (current) use of anticoagulants: Secondary | ICD-10-CM

## 2017-07-07 LAB — POCT INR: INR: 2

## 2017-07-07 NOTE — Patient Instructions (Addendum)
  Description   Take 1 tablet today, then resume 1/2 tablet (5mg ) every day. Recheck in 6 weeks. Call our office if placed on any new medications or if you have any concerns [phone].

## 2017-07-15 ENCOUNTER — Ambulatory Visit (INDEPENDENT_AMBULATORY_CARE_PROVIDER_SITE_OTHER): Payer: 59 | Admitting: Family Medicine

## 2017-07-15 ENCOUNTER — Other Ambulatory Visit: Payer: Self-pay

## 2017-07-15 ENCOUNTER — Encounter: Payer: Self-pay | Admitting: Family Medicine

## 2017-07-15 VITALS — BP 123/90 | HR 104 | Temp 98.0°F | Resp 16 | Ht 70.0 in | Wt 275.2 lb

## 2017-07-15 DIAGNOSIS — Z0000 Encounter for general adult medical examination without abnormal findings: Secondary | ICD-10-CM

## 2017-07-15 DIAGNOSIS — N522 Drug-induced erectile dysfunction: Secondary | ICD-10-CM | POA: Diagnosis not present

## 2017-07-15 DIAGNOSIS — Z125 Encounter for screening for malignant neoplasm of prostate: Secondary | ICD-10-CM | POA: Diagnosis not present

## 2017-07-15 DIAGNOSIS — E119 Type 2 diabetes mellitus without complications: Secondary | ICD-10-CM

## 2017-07-15 LAB — CBC WITH DIFFERENTIAL/PLATELET
Basophils Absolute: 0.1 10*3/uL (ref 0.0–0.1)
Basophils Relative: 0.7 % (ref 0.0–3.0)
Eosinophils Absolute: 0.3 10*3/uL (ref 0.0–0.7)
Eosinophils Relative: 2.6 % (ref 0.0–5.0)
HCT: 44.4 % (ref 39.0–52.0)
Hemoglobin: 15.4 g/dL (ref 13.0–17.0)
Lymphocytes Relative: 30.1 % (ref 12.0–46.0)
Lymphs Abs: 3.2 10*3/uL (ref 0.7–4.0)
MCHC: 34.7 g/dL (ref 30.0–36.0)
MCV: 82.6 fl (ref 78.0–100.0)
Monocytes Absolute: 0.9 10*3/uL (ref 0.1–1.0)
Monocytes Relative: 7.9 % (ref 3.0–12.0)
Neutro Abs: 6.3 10*3/uL (ref 1.4–7.7)
Neutrophils Relative %: 58.7 % (ref 43.0–77.0)
Platelets: 252 10*3/uL (ref 150.0–400.0)
RBC: 5.37 Mil/uL (ref 4.22–5.81)
RDW: 13.4 % (ref 11.5–15.5)
WBC: 10.8 10*3/uL — ABNORMAL HIGH (ref 4.0–10.5)

## 2017-07-15 LAB — HEPATIC FUNCTION PANEL
ALT: 27 U/L (ref 0–53)
AST: 17 U/L (ref 0–37)
Albumin: 4.1 g/dL (ref 3.5–5.2)
Alkaline Phosphatase: 96 U/L (ref 39–117)
Bilirubin, Direct: 0.1 mg/dL (ref 0.0–0.3)
Total Bilirubin: 0.6 mg/dL (ref 0.2–1.2)
Total Protein: 6.9 g/dL (ref 6.0–8.3)

## 2017-07-15 LAB — BASIC METABOLIC PANEL
BUN: 18 mg/dL (ref 6–23)
CO2: 26 mEq/L (ref 19–32)
Calcium: 9.7 mg/dL (ref 8.4–10.5)
Chloride: 104 mEq/L (ref 96–112)
Creatinine, Ser: 0.86 mg/dL (ref 0.40–1.50)
GFR: 119.23 mL/min (ref >60.00)
Glucose, Bld: 97 mg/dL (ref 70–99)
Potassium: 4.3 mEq/L (ref 3.5–5.1)
Sodium: 140 mEq/L (ref 135–145)

## 2017-07-15 LAB — HEMOGLOBIN A1C: Hgb A1c MFr Bld: 7.5 % — ABNORMAL HIGH (ref 4.6–6.5)

## 2017-07-15 LAB — PSA: PSA: 0.56 ng/mL (ref 0.10–4.00)

## 2017-07-15 LAB — LIPID PANEL
Cholesterol: 122 mg/dL (ref 0–200)
HDL: 31.5 mg/dL — ABNORMAL LOW (ref >39.00)
NonHDL: 90.74
Total CHOL/HDL Ratio: 4
Triglycerides: 229 mg/dL — ABNORMAL HIGH (ref 0.0–149.0)
VLDL: 45.8 mg/dL — ABNORMAL HIGH (ref 0.0–40.0)

## 2017-07-15 LAB — LDL CHOLESTEROL, DIRECT: Direct LDL: 70 mg/dL

## 2017-07-15 LAB — TSH: TSH: 0.62 u[IU]/mL (ref 0.35–4.50)

## 2017-07-15 MED ORDER — SILDENAFIL CITRATE 100 MG PO TABS: 100.0000 mg | ORAL_TABLET | Freq: Every day | ORAL | 3 refills | Status: DC | PRN

## 2017-07-15 NOTE — Patient Instructions (Signed)
Follow up in 3-4 months to recheck diabetes We'll notify you of your lab results and make any changes if needed Continue to work on healthy diet and regular exercise- you can do it! Schedule your eye exam at your convenience Call with any questions or concerns Have a great week!!!

## 2017-07-15 NOTE — Assessment & Plan Note (Signed)
Pt's PE WNL w/ exception of obesity.  UTD on colonoscopy and immunizations.  Declined GU exam.  Check labs.  Anticipatory guidance provided.

## 2017-07-15 NOTE — Progress Notes (Signed)
   Subjective:    Patient ID: Paulina FusiRodney L Chalmers, male    DOB: 1965-08-09, 52 y.o.   MRN: [id number]  HPI CPE- UTD on colonoscopy.  UTD on eye exam (due later this month), on ACE.  Due for foot exam.  UTD on immunizations.   Review of Systems Patient reports no vision/hearing changes, anorexia, fever ,adenopathy, persistant/recurrent hoarseness, swallowing issues, chest pain, palpitations, edema, persistant/recurrent cough, hemoptysis, dyspnea (rest,exertional, paroxysmal nocturnal), gastrointestinal  bleeding (melena, rectal bleeding), abdominal pain, excessive heart burn, GU symptoms (dysuria, hematuria, voiding/incontinence issues) syncope, focal weakness, memory loss, numbness & tingling, skin/hair/nail changes, depression, anxiety, abnormal bruising/bleeding, musculoskeletal symptoms/signs.     Objective:   Physical Exam General Appearance:    Alert, cooperative, no distress, appears stated age, obese  Head:    Normocephalic, without obvious abnormality, atraumatic  Eyes:    PERRL, conjunctiva/corneas clear, EOM's intact, fundi    benign, both eyes       Ears:    Normal TM's and external ear canals, both ears  Nose:   Nares normal, septum midline, mucosa normal, no drainage   or sinus tenderness  Throat:   Lips, mucosa, and tongue normal; teeth and gums normal  Neck:   Supple, symmetrical, trachea midline, no adenopathy;       thyroid:  No enlargement/tenderness/nodules  Back:     Symmetric, no curvature, ROM normal, no CVA tenderness  Lungs:     Clear to auscultation bilaterally, respirations unlabored  Chest wall:    No tenderness or deformity  Heart:    Regular rate and rhythm, S1 and S2 normal, no murmur, rub   or gallop  Abdomen:     Soft, non-tender, bowel sounds active all four quadrants,    no masses, no organomegaly  Genitalia:    Deferred at pt's request  Rectal:    Extremities:   Extremities normal, atraumatic, no cyanosis or edema  Pulses:   2+ and symmetric all  extremities  Skin:   Skin color, texture, turgor normal, no rashes or lesions  Lymph nodes:   Cervical, supraclavicular, and axillary nodes normal  Neurologic:   CNII-XII intact. Normal strength, sensation and reflexes      throughout          Assessment & Plan:

## 2017-07-15 NOTE — Assessment & Plan Note (Signed)
Chronic problem.  Foot exam done today.  On ACE for renal protection.  Due for eye exam later this month.  Check labs.  Adjust meds prn

## 2017-07-15 NOTE — Assessment & Plan Note (Signed)
Stressed need for healthy diet and regular exercise.  Pt is down 5 lbs.  Applauded his efforts.  Check labs to risk stratify.  Will follow.

## 2017-07-15 NOTE — Assessment & Plan Note (Signed)
Refill provided on Viagra 

## 2017-07-16 ENCOUNTER — Encounter: Payer: Self-pay | Admitting: General Practice

## 2017-08-11 ENCOUNTER — Other Ambulatory Visit: Payer: Self-pay | Admitting: Cardiology

## 2017-08-18 ENCOUNTER — Ambulatory Visit (INDEPENDENT_AMBULATORY_CARE_PROVIDER_SITE_OTHER): Payer: 59 | Admitting: *Deleted

## 2017-08-18 DIAGNOSIS — Z5181 Encounter for therapeutic drug level monitoring: Secondary | ICD-10-CM | POA: Diagnosis not present

## 2017-08-18 DIAGNOSIS — I4891 Unspecified atrial fibrillation: Secondary | ICD-10-CM | POA: Diagnosis not present

## 2017-08-18 DIAGNOSIS — I4819 Other persistent atrial fibrillation: Secondary | ICD-10-CM

## 2017-08-18 DIAGNOSIS — I481 Persistent atrial fibrillation: Secondary | ICD-10-CM

## 2017-08-18 DIAGNOSIS — Z7901 Long term (current) use of anticoagulants: Secondary | ICD-10-CM

## 2017-08-18 LAB — POCT INR: INR: 1.5

## 2017-08-18 NOTE — Patient Instructions (Signed)
Description   Today March 26th take 1 tablet (10 mg) then tomorrow March 26th take 1 tablet (10 mg) then continue  1/2 tablet (5mg ) every day. Recheck in 2 weeks. Call our office if placed on any new medications or if you have any concerns [phone].

## 2017-08-20 DIAGNOSIS — H2513 Age-related nuclear cataract, bilateral: Secondary | ICD-10-CM | POA: Diagnosis not present

## 2017-08-20 DIAGNOSIS — E119 Type 2 diabetes mellitus without complications: Secondary | ICD-10-CM | POA: Diagnosis not present

## 2017-08-20 LAB — HM DIABETES EYE EXAM

## 2017-08-28 ENCOUNTER — Other Ambulatory Visit: Payer: Self-pay | Admitting: Family Medicine

## 2017-09-02 ENCOUNTER — Ambulatory Visit (INDEPENDENT_AMBULATORY_CARE_PROVIDER_SITE_OTHER): Payer: 59 | Admitting: *Deleted

## 2017-09-02 ENCOUNTER — Other Ambulatory Visit: Payer: Self-pay | Admitting: Cardiology

## 2017-09-02 DIAGNOSIS — I4891 Unspecified atrial fibrillation: Secondary | ICD-10-CM

## 2017-09-02 DIAGNOSIS — Z7901 Long term (current) use of anticoagulants: Secondary | ICD-10-CM

## 2017-09-02 LAB — POCT INR: INR: 2

## 2017-09-02 NOTE — Patient Instructions (Signed)
Description   Today take 1 tablet then continue taking 1/2 tablet (5mg ) every day. Recheck in 6 weeks per pt request & risks advised. Call our office if placed on any new medications or if you have any concerns [phone].

## 2017-09-08 ENCOUNTER — Encounter: Payer: Self-pay | Admitting: General Practice

## 2017-10-01 ENCOUNTER — Ambulatory Visit: Payer: 59 | Admitting: Family Medicine

## 2017-10-14 ENCOUNTER — Ambulatory Visit (INDEPENDENT_AMBULATORY_CARE_PROVIDER_SITE_OTHER): Payer: 59 | Admitting: *Deleted

## 2017-10-14 DIAGNOSIS — I4891 Unspecified atrial fibrillation: Secondary | ICD-10-CM | POA: Diagnosis not present

## 2017-10-14 DIAGNOSIS — Z7901 Long term (current) use of anticoagulants: Secondary | ICD-10-CM | POA: Diagnosis not present

## 2017-10-14 LAB — POCT INR: INR: 1.7 — AB (ref 2.0–3.0)

## 2017-10-14 NOTE — Patient Instructions (Signed)
Description   Today take 1 tablet then continue taking 1/2 tablet ( ) every day. Recheck in 3 weeks.  Call our office if placed on any new medications or if you have any concerns [phone].

## 2017-10-21 NOTE — Progress Notes (Signed)
HPI: FU atrial fibrillation. Cardiac catheterization in January of 2001 showed normal LV function and normal coronary arteries. Nuclear study in June of 2011 showed soft tissue attenuation and no ischemia. Patient has been treated with flecainide for atrial fibrillation. He did have an exercise treadmill in September of 2011 that showed no exercise induced ventricular tachycardia. Last echocardiogram February 2018 showed normal LV systolic function, moderate diastolic dysfunction, mild right ventricular enlargement. Patient was scheduled for cardioversion 2/18 but upon arrival was found to be in sinus rhythm. Since last seen,the patient has dyspnea with more extreme activities but not with routine activities. It is relieved with rest. It is not associated with chest pain. There is no orthopnea, PND or pedal edema. There is no syncope or palpitations. There is no exertional chest pain.   Current Outpatient Medications  Medication Sig Dispense Refill  . amLODipine (NORVASC) 5 MG tablet TAKE 1 TABLET BY MOUTH  DAILY 90 tablet 3  . benazepril (LOTENSIN) 20 MG tablet TAKE 1 TABLET BY MOUTH  DAILY 90 tablet 3  . flecainide (TAMBOCOR) 100 MG tablet TAKE 1 TABLET BY MOUTH TWO  TIMES DAILY 180 tablet 3  . fluticasone (FLONASE) 50 MCG/ACT nasal spray Place 2 sprays into both nostrils daily. (Patient taking differently: Place 2 sprays into both nostrils daily as needed. ) 16 g 5  . hydrochlorothiazide (HYDRODIURIL) 12.5 MG tablet TAKE 1 TABLET BY MOUTH  DAILY 90 tablet 3  . metFORMIN (GLUCOPHAGE-XR) 500 MG 24 hr tablet TAKE 2 TABLETS BY MOUTH ONCE DAILY WITH BREAKFAST AND 1 AT NIGHT WITH SUPPER 270 tablet 1  . metoprolol succinate (TOPROL-XL) 50 MG 24 hr tablet TAKE 3 TABLETS BY MOUTH  DAILY. TAKE WITH OR  IMMEDIATELY FOLLOWING A  MEAL. 270 tablet 3  . Multiple Vitamin (MULTIVITAMIN) tablet Take 1 tablet by mouth daily.      . Omega-3 Fatty Acids (FISH OIL) 1200 MG CAPS Take 2 capsules by mouth 2 (two)  times daily.    . pravastatin (PRAVACHOL) 40 MG tablet TAKE 1 TABLET BY MOUTH  DAILY 90 tablet 3  . sildenafil (VIAGRA) 100 MG tablet Take 1 tablet (100 mg total) by mouth daily as needed for erectile dysfunction. 30 tablet 3  . warfarin (COUMADIN) 10 MG tablet Take 1/2 tablet by mouth daily as directed by anticoagulation clinic 45 tablet 0   No current facility-administered medications for this visit.      Past Medical History:  Diagnosis Date  . DM II (diabetes mellitus, type II), controlled ([facility])   . Erectile dysfunction   . Hyperlipidemia   . Hypertension   . Obesity   . OSA (obstructive sleep apnea)   . Paroxysmal atrial fibrillation [facility])     Past Surgical History:  Procedure Laterality Date  . CARDIAC CATHETERIZATION    . FINGER SURGERY     left 1st finger    Social History   Socioeconomic History  . Marital status: Married    Spouse name: Not on file  . Number of children: Not on file  . Years of education: Not on file  . Highest education level: Not on file  Occupational History  . Not on file  Social Needs  . Financial resource strain: Not on file  . Food insecurity:    Worry: Not on file    Inability: Not on file  . Transportation needs:    Medical: Not on file    Non-medical: Not on file  Tobacco Use  .  Smoking status: Former Smoker    Last attempt to quit: 05/26/1994    Years since quitting: 23.4  . Smokeless tobacco: Never Used  . Tobacco comment: quit in 1995  Substance and Sexual Activity  . Alcohol use: Yes    Alcohol/week: 3.0 oz    Types: 5 Cans of beer per week  . Drug use: No  . Sexual activity: Yes  Lifestyle  . Physical activity:    Days per week: Not on file    Minutes per session: Not on file  . Stress: Not on file  Relationships  . Social connections:    Talks on phone: Not on file    Gets together: Not on file    Attends religious service: Not on file    Active member of club or organization: Not on file    Attends meetings  of clubs or organizations: Not on file    Relationship status: Not on file  . Intimate partner violence:    Fear of current or ex partner: Not on file    Emotionally abused: Not on file    Physically abused: Not on file    Forced sexual activity: Not on file  Other Topics Concern  . Not on file  Social History Narrative  . Not on file    Family History  Problem Relation Age of Onset  . Heart attack Paternal Uncle   . Heart attack Maternal Grandmother   . Heart disease Mother   . Hypertension Mother   . Diabetes Mother   . Alcohol abuse Father   . Cancer Neg Hx   . Early death Neg Hx   . Hyperlipidemia Neg Hx   . Kidney disease Neg Hx   . Stroke Neg Hx     ROS: no fevers or chills, productive cough, hemoptysis, dysphasia, odynophagia, melena, hematochezia, dysuria, hematuria, rash, seizure activity, orthopnea, PND, pedal edema, claudication. Remaining systems are negative.  Physical Exam: Well-developed well-nourished in no acute distress.  Skin is warm and dry.  HEENT is normal.  Neck is supple.  Chest is clear to auscultation with normal expansion.  Cardiovascular exam is regular rate and rhythm.  Abdominal exam nontender or distended. No masses palpated. Extremities show no edema. neuro grossly intact  ECG-sinus rhythm at a rate of 66.  Early repolarization abnormality.  Personally reviewed  A/P  1 paroxysmal atrial fibrillation-patient remains in sinus rhythm today.  Continue flecainide, beta-blocker and Coumadin.  Patient is not interested in DOACs.   2 hypertension-blood pressure controlled.  Continue present medications.  3 hyperlipidemia-continue statin.  Olga Millers, MD

## 2017-10-22 ENCOUNTER — Ambulatory Visit: Payer: 59 | Admitting: Cardiology

## 2017-10-22 ENCOUNTER — Encounter: Payer: Self-pay | Admitting: Cardiology

## 2017-10-22 VITALS — BP 110/74 | HR 66 | Ht 70.0 in | Wt 268.0 lb

## 2017-10-22 DIAGNOSIS — I480 Paroxysmal atrial fibrillation: Secondary | ICD-10-CM | POA: Diagnosis not present

## 2017-10-22 DIAGNOSIS — E7800 Pure hypercholesterolemia, unspecified: Secondary | ICD-10-CM | POA: Diagnosis not present

## 2017-10-22 DIAGNOSIS — I10 Essential (primary) hypertension: Secondary | ICD-10-CM

## 2017-10-22 NOTE — Patient Instructions (Signed)
Your physician wants you to follow-up in: 6 MONTHS WITH DR CRENSHAW You will receive a reminder letter in the mail two months in advance. If you don't receive a letter, please call our office to schedule the follow-up appointment.   If you need a refill on your cardiac medications before your next appointment, please call your pharmacy.  

## 2017-11-04 ENCOUNTER — Ambulatory Visit (INDEPENDENT_AMBULATORY_CARE_PROVIDER_SITE_OTHER): Payer: 59 | Admitting: *Deleted

## 2017-11-04 DIAGNOSIS — I4891 Unspecified atrial fibrillation: Secondary | ICD-10-CM

## 2017-11-04 DIAGNOSIS — Z7901 Long term (current) use of anticoagulants: Secondary | ICD-10-CM | POA: Diagnosis not present

## 2017-11-04 LAB — POCT INR: INR: 2.3 (ref 2.0–3.0)

## 2017-11-04 NOTE — Patient Instructions (Signed)
Description   Continue taking 1/2 tablet (5mg ) every day. Recheck in 5 weeks.  Call our office if placed on any new medications or if you have any concerns [phone].

## 2017-11-09 ENCOUNTER — Ambulatory Visit: Payer: 59 | Admitting: Family Medicine

## 2017-11-09 ENCOUNTER — Encounter: Payer: Self-pay | Admitting: Family Medicine

## 2017-11-09 ENCOUNTER — Other Ambulatory Visit: Payer: Self-pay

## 2017-11-09 VITALS — BP 112/81 | HR 73 | Temp 98.1°F | Resp 16 | Ht 70.0 in | Wt 274.0 lb

## 2017-11-09 DIAGNOSIS — E119 Type 2 diabetes mellitus without complications: Secondary | ICD-10-CM

## 2017-11-09 LAB — BASIC METABOLIC PANEL
BUN: 22 mg/dL (ref 6–23)
CO2: 26 mEq/L (ref 19–32)
Calcium: 9.2 mg/dL (ref 8.4–10.5)
Chloride: 104 mEq/L (ref 96–112)
Creatinine, Ser: 0.94 mg/dL (ref 0.40–1.50)
GFR: 107.47 mL/min (ref >60.00)
Glucose, Bld: 181 mg/dL — ABNORMAL HIGH (ref 70–99)
Potassium: 3.9 mEq/L (ref 3.5–5.1)
Sodium: 139 mEq/L (ref 135–145)

## 2017-11-09 LAB — HEMOGLOBIN A1C: Hgb A1c MFr Bld: 7 % — ABNORMAL HIGH (ref 4.6–6.5)

## 2017-11-09 MED ORDER — METFORMIN HCL ER 500 MG PO TB24: ORAL_TABLET | ORAL | 1 refills | Status: DC

## 2017-11-09 NOTE — Patient Instructions (Signed)
Follow up in 3-4 months to recheck diabetes, BP, and cholesterol We'll notify you of your lab results and make any changes if needed Continue to work on healthy diet and regular exercise- you can do it! Call with any questions or concerns Have a great summer!!

## 2017-11-09 NOTE — Progress Notes (Signed)
   Subjective:    Patient ID: Daniel Grant, male    DOB: 22-Sep-1965, 52 y.o.   MRN: [id number]  HPI DM- chronic problem, on Metformin 2 tabs QAM and 1 QPM.  UTD on foot exam, eye exam, and on ACE for renal protection.  Pt is up 6 lbs since 5/30- but he is wearing his steel toed work boots today.  Home CBGs 110-120s.  Denies symptomatic lows.  No CP, SOB, HAs, visual changes, abd pain, N/V, numbness/tingling of hands/feet.     Review of Systems For ROS see HPI     Objective:   Physical Exam  Constitutional: He is oriented to person, place, and time. He appears well-developed and well-nourished. No distress.  obese  HENT:  Head: Normocephalic and atraumatic.  Eyes: Pupils are equal, round, and reactive to light. Conjunctivae and EOM are normal.  Neck: Normal range of motion. Neck supple. No thyromegaly present.  Cardiovascular: Normal rate, regular rhythm, normal heart sounds and intact distal pulses.  No murmur heard. Pulmonary/Chest: Effort normal and breath sounds normal. No respiratory distress.  Abdominal: Soft. Bowel sounds are normal. He exhibits no distension.  Musculoskeletal: He exhibits no edema.  Lymphadenopathy:    He has no cervical adenopathy.  Neurological: He is alert and oriented to person, place, and time. No cranial nerve deficit.  Skin: Skin is warm and dry.  Psychiatric: He has a normal mood and affect. His behavior is normal.  Vitals reviewed.         Assessment & Plan:

## 2017-11-09 NOTE — Assessment & Plan Note (Signed)
Chronic problem.  Hx of adequate control.  UTD on eye exam, foot exam.  On ACE for renal protection.  Stressed need for healthy diet and regular exercise.  Check labs.  Adjust meds prn

## 2017-11-10 ENCOUNTER — Encounter: Payer: Self-pay | Admitting: General Practice

## 2017-12-09 ENCOUNTER — Ambulatory Visit: Payer: 59 | Admitting: Pharmacist

## 2017-12-09 DIAGNOSIS — Z7901 Long term (current) use of anticoagulants: Secondary | ICD-10-CM | POA: Diagnosis not present

## 2017-12-09 DIAGNOSIS — I4891 Unspecified atrial fibrillation: Secondary | ICD-10-CM | POA: Diagnosis not present

## 2017-12-09 LAB — POCT INR: INR: 2.2 (ref 2.0–3.0)

## 2017-12-09 NOTE — Patient Instructions (Signed)
Description   Continue taking 1/2 tablet (5mg ) every day. Recommend follow up in 4 weeks, pt will not come back for 6 weeks. He is aware of the risk.  Call our office if placed on any new medications or if you have any concerns [phone].

## 2017-12-10 ENCOUNTER — Telehealth: Payer: Self-pay | Admitting: Internal Medicine

## 2018-01-18 ENCOUNTER — Ambulatory Visit: Payer: 59 | Admitting: *Deleted

## 2018-01-18 DIAGNOSIS — Z7901 Long term (current) use of anticoagulants: Secondary | ICD-10-CM

## 2018-01-18 DIAGNOSIS — I4891 Unspecified atrial fibrillation: Secondary | ICD-10-CM | POA: Diagnosis not present

## 2018-01-18 LAB — POCT INR: INR: 1.7 — AB (ref 2.0–3.0)

## 2018-01-18 NOTE — Patient Instructions (Signed)
Description   Today take 1 tablet then continue taking 1/2 tablet (5mg ) every day. Recommend follow up in 4 weeks.  Call our office if placed on any new medications or if you have any concerns [phone].

## 2018-02-16 ENCOUNTER — Ambulatory Visit: Payer: 59 | Admitting: *Deleted

## 2018-02-16 DIAGNOSIS — I4891 Unspecified atrial fibrillation: Secondary | ICD-10-CM

## 2018-02-16 DIAGNOSIS — Z7901 Long term (current) use of anticoagulants: Secondary | ICD-10-CM

## 2018-02-16 LAB — POCT INR: INR: 2.1 (ref 2.0–3.0)

## 2018-02-16 NOTE — Patient Instructions (Signed)
Description   Continue taking 1/2 tablet (5mg ) every day. Recommend follow up in 6 weeks.  Call our office if placed on any new medications or if you have any concerns [phone].

## 2018-03-05 ENCOUNTER — Ambulatory Visit: Payer: 59 | Admitting: Family Medicine

## 2018-03-05 ENCOUNTER — Encounter: Payer: Self-pay | Admitting: Family Medicine

## 2018-03-05 ENCOUNTER — Other Ambulatory Visit: Payer: Self-pay

## 2018-03-05 VITALS — BP 110/80 | HR 57 | Temp 98.1°F | Resp 14 | Ht 70.0 in | Wt 271.0 lb

## 2018-03-05 DIAGNOSIS — E785 Hyperlipidemia, unspecified: Secondary | ICD-10-CM

## 2018-03-05 DIAGNOSIS — E119 Type 2 diabetes mellitus without complications: Secondary | ICD-10-CM

## 2018-03-05 DIAGNOSIS — Z23 Encounter for immunization: Secondary | ICD-10-CM | POA: Diagnosis not present

## 2018-03-05 DIAGNOSIS — I10 Essential (primary) hypertension: Secondary | ICD-10-CM

## 2018-03-05 LAB — LIPID PANEL
Cholesterol: 133 mg/dL (ref 0–200)
HDL: 34.9 mg/dL — ABNORMAL LOW (ref >39.00)
LDL Cholesterol: 64 mg/dL (ref 0–99)
NonHDL: 98.29
Total CHOL/HDL Ratio: 4
Triglycerides: 169 mg/dL — ABNORMAL HIGH (ref 0.0–149.0)
VLDL: 33.8 mg/dL (ref 0.0–40.0)

## 2018-03-05 LAB — BASIC METABOLIC PANEL
BUN: 26 mg/dL — ABNORMAL HIGH (ref 6–23)
CO2: 27 mEq/L (ref 19–32)
Calcium: 9.3 mg/dL (ref 8.4–10.5)
Chloride: 103 mEq/L (ref 96–112)
Creatinine, Ser: 1.1 mg/dL (ref 0.40–1.50)
GFR: 89.53 mL/min (ref >60.00)
Glucose, Bld: 167 mg/dL — ABNORMAL HIGH (ref 70–99)
Potassium: 4.1 mEq/L (ref 3.5–5.1)
Sodium: 139 mEq/L (ref 135–145)

## 2018-03-05 LAB — CBC WITH DIFFERENTIAL/PLATELET
Basophils Absolute: 0.1 10*3/uL (ref 0.0–0.1)
Basophils Relative: 0.6 % (ref 0.0–3.0)
Eosinophils Absolute: 0.2 10*3/uL (ref 0.0–0.7)
Eosinophils Relative: 2.4 % (ref 0.0–5.0)
HCT: 42.1 % (ref 39.0–52.0)
Hemoglobin: 14.4 g/dL (ref 13.0–17.0)
Lymphocytes Relative: 24.6 % (ref 12.0–46.0)
Lymphs Abs: 2.5 10*3/uL (ref 0.7–4.0)
MCHC: 34.3 g/dL (ref 30.0–36.0)
MCV: 82.9 fl (ref 78.0–100.0)
Monocytes Absolute: 0.7 10*3/uL (ref 0.1–1.0)
Monocytes Relative: 6.8 % (ref 3.0–12.0)
Neutro Abs: 6.5 10*3/uL (ref 1.4–7.7)
Neutrophils Relative %: 65.6 % (ref 43.0–77.0)
Platelets: 218 10*3/uL (ref 150.0–400.0)
RBC: 5.07 Mil/uL (ref 4.22–5.81)
RDW: 13.3 % (ref 11.5–15.5)
WBC: 10 10*3/uL (ref 4.0–10.5)

## 2018-03-05 LAB — HEPATIC FUNCTION PANEL
ALT: 23 U/L (ref 0–53)
AST: 15 U/L (ref 0–37)
Albumin: 4.1 g/dL (ref 3.5–5.2)
Alkaline Phosphatase: 74 U/L (ref 39–117)
Bilirubin, Direct: 0.1 mg/dL (ref 0.0–0.3)
Total Bilirubin: 0.5 mg/dL (ref 0.2–1.2)
Total Protein: 6.5 g/dL (ref 6.0–8.3)

## 2018-03-05 LAB — HEMOGLOBIN A1C: Hgb A1c MFr Bld: 7 % — ABNORMAL HIGH (ref 4.6–6.5)

## 2018-03-05 LAB — TSH: TSH: 0.82 u[IU]/mL (ref 0.35–4.50)

## 2018-03-05 NOTE — Patient Instructions (Signed)
Follow up in 3-4 months to recheck diabetes We'll notify you of your lab results and make any changes if needed Continue to work on healthy diet and regular exercise- you can do it! Call with any questions or concerns Happy Fall!!!  Ms Marton Redwood- [facility]!!!!  I'm so proud of you!  (and shame on you for not telling me!)

## 2018-03-05 NOTE — Assessment & Plan Note (Signed)
Chronic problem.  Tolerating statin w/o difficulty.  Check labs.  Adjust meds prn  

## 2018-03-05 NOTE — Assessment & Plan Note (Signed)
Chronic problem.  Hx of good control.  UTD on foot exam, eye exam.  On ACE for renal protection.  Check labs.  Adjust meds prn  

## 2018-03-05 NOTE — Assessment & Plan Note (Signed)
Chronic problem.  Well controlled.  Asymptomatic.  Check labs.  No anticipated med changes.  Will follow. 

## 2018-03-05 NOTE — Progress Notes (Signed)
   Subjective:    Patient ID: Daniel Grant, male    DOB: [birth year], 52 y.o.   MRN: [id number]  HPI DM- chronic problem, chronic problem, on Metformin 1000mg  QAM, 500mg  QPM.  Pt is down 3 lbs, UTD on eye exam, foot exam.  On ACE for renal protection.  CBGs in the 110s. Rare symptomatic lows.  No numbness/tingling of hands/feet.  HTN- chronic problem, on Metoprolol 150mg , HCTZ 12.5mg , Benazepril 20mg , Amlodipine 5mg  daily w/ good control.  No CP, SOB, HAs, visual changes, edema.  Hyperlipidemia- chronic problem, on Pravastatin 40mg  daily.  No abd pain, N/V.   Review of Systems For ROS see HPI     Objective:   Physical Exam  Constitutional: He is oriented to person, place, and time. He appears well-developed and well-nourished. No distress.  obese  HENT:  Head: Normocephalic and atraumatic.  Eyes: Pupils are equal, round, and reactive to light. Conjunctivae and EOM are normal.  Neck: Normal range of motion. Neck supple. No thyromegaly present.  Cardiovascular: Normal rate, regular rhythm, normal heart sounds and intact distal pulses.  No murmur heard. Pulmonary/Chest: Effort normal and breath sounds normal. No respiratory distress.  Abdominal: Soft. Bowel sounds are normal. He exhibits no distension.  Musculoskeletal: He exhibits no edema.  Lymphadenopathy:    He has no cervical adenopathy.  Neurological: He is alert and oriented to person, place, and time. No cranial nerve deficit.  Skin: Skin is warm and dry.  Psychiatric: He has a normal mood and affect. His behavior is normal.  Vitals reviewed.         Assessment & Plan:

## 2018-03-22 ENCOUNTER — Other Ambulatory Visit: Payer: Self-pay | Admitting: Family Medicine

## 2018-03-26 ENCOUNTER — Ambulatory Visit: Payer: 59 | Admitting: Pharmacist

## 2018-03-26 DIAGNOSIS — I4891 Unspecified atrial fibrillation: Secondary | ICD-10-CM

## 2018-03-26 DIAGNOSIS — Z7901 Long term (current) use of anticoagulants: Secondary | ICD-10-CM

## 2018-03-26 LAB — POCT INR: INR: 2.2 (ref 2.0–3.0)

## 2018-03-26 NOTE — Patient Instructions (Signed)
Description   Continue taking 1/2 tablet (5mg) every day. Follow up in 6 weeks.  Call our office if placed on any new medications or if you have any concerns [phone].     

## 2018-05-05 ENCOUNTER — Ambulatory Visit: Payer: 59 | Admitting: *Deleted

## 2018-05-05 DIAGNOSIS — I4891 Unspecified atrial fibrillation: Secondary | ICD-10-CM | POA: Diagnosis not present

## 2018-05-05 DIAGNOSIS — Z7901 Long term (current) use of anticoagulants: Secondary | ICD-10-CM | POA: Diagnosis not present

## 2018-05-05 LAB — POCT INR: INR: 2.4 (ref 2.0–3.0)

## 2018-05-05 NOTE — Patient Instructions (Signed)
Description   Continue taking 1/2 tablet (5mg ) every day. Follow up in 6 weeks.  Call our office if placed on any new medications or if you have any concerns [phone].

## 2018-06-16 ENCOUNTER — Ambulatory Visit: Payer: BLUE CROSS/BLUE SHIELD | Admitting: Pharmacist

## 2018-06-16 DIAGNOSIS — I4819 Other persistent atrial fibrillation: Secondary | ICD-10-CM

## 2018-06-16 DIAGNOSIS — Z5181 Encounter for therapeutic drug level monitoring: Secondary | ICD-10-CM | POA: Diagnosis not present

## 2018-06-16 LAB — POCT INR: INR: 2.5 (ref 2.0–3.0)

## 2018-06-16 NOTE — Patient Instructions (Signed)
Continue taking 1/2 tablet (5mg) every day. Follow up in 6 weeks.  Call our office if placed on any new medications or if you have any concerns [phone]. 

## 2018-07-05 ENCOUNTER — Ambulatory Visit: Payer: 59 | Admitting: Family Medicine

## 2018-07-07 ENCOUNTER — Other Ambulatory Visit: Payer: Self-pay

## 2018-07-07 ENCOUNTER — Encounter: Payer: Self-pay | Admitting: Family Medicine

## 2018-07-07 ENCOUNTER — Ambulatory Visit: Payer: BLUE CROSS/BLUE SHIELD | Admitting: Family Medicine

## 2018-07-07 VITALS — BP 110/78 | HR 101 | Temp 98.1°F | Resp 16 | Ht 70.0 in | Wt 282.0 lb

## 2018-07-07 DIAGNOSIS — I480 Paroxysmal atrial fibrillation: Secondary | ICD-10-CM

## 2018-07-07 DIAGNOSIS — E119 Type 2 diabetes mellitus without complications: Secondary | ICD-10-CM

## 2018-07-07 DIAGNOSIS — I4819 Other persistent atrial fibrillation: Secondary | ICD-10-CM | POA: Diagnosis not present

## 2018-07-07 LAB — BASIC METABOLIC PANEL
BUN: 24 mg/dL — ABNORMAL HIGH (ref 6–23)
CO2: 26 mEq/L (ref 19–32)
Calcium: 9.3 mg/dL (ref 8.4–10.5)
Chloride: 104 mEq/L (ref 96–112)
Creatinine, Ser: 1 mg/dL (ref 0.40–1.50)
GFR: 93.91 mL/min (ref >60.00)
Glucose, Bld: 112 mg/dL — ABNORMAL HIGH (ref 70–99)
Potassium: 4.3 mEq/L (ref 3.5–5.1)
Sodium: 138 mEq/L (ref 135–145)

## 2018-07-07 LAB — HEMOGLOBIN A1C: Hgb A1c MFr Bld: 7.5 % — ABNORMAL HIGH (ref 4.6–6.5)

## 2018-07-07 NOTE — Assessment & Plan Note (Signed)
Following w/ Dr Jens Som, on Coumadin.  Controlled on Flecainide, Metoprolol

## 2018-07-07 NOTE — Assessment & Plan Note (Signed)
Deteriorated.  Pt has gained 11lbs.  Stressed need for healthy diet and regular exercise.  Will continue to follow.

## 2018-07-07 NOTE — Progress Notes (Signed)
   Subjective:    Patient ID: Daniel Grant, male    DOB: Jun 22, 1965, 53 y.o.   MRN: [id number]  HPI DM- chronic problem, on Metformin 1000mg  QAM and 500mg  QPM.  On ACE for renal protection.  UTD on eye exam, due for repeat foot exam.  Wife has been 'getting on' pt about his habits.  No CP, SOB, HAs, visual changes, numbness/tingling of hands/feet.  Denies symptomatic lows.  No sores on feet.  Obesity- pt has gained 11 lbs since October visit.  BMI is now 40.46  Admits to eating poorly and not exercising.     Review of Systems For ROS see HPI     Objective:   Physical Exam Vitals signs reviewed.  Constitutional:      General: He is not in acute distress.    Appearance: He is well-developed. He is obese.  HENT:     Head: Normocephalic and atraumatic.  Eyes:     Conjunctiva/sclera: Conjunctivae normal.     Pupils: Pupils are equal, round, and reactive to light.  Neck:     Musculoskeletal: Normal range of motion and neck supple.     Thyroid: No thyromegaly.  Cardiovascular:     Rate and Rhythm: Normal rate and regular rhythm.     Heart sounds: Normal heart sounds. No murmur.  Pulmonary:     Effort: Pulmonary effort is normal. No respiratory distress.     Breath sounds: Normal breath sounds.  Abdominal:     General: Bowel sounds are normal. There is no distension.     Palpations: Abdomen is soft.  Lymphadenopathy:     Cervical: No cervical adenopathy.  Skin:    General: Skin is warm and dry.  Neurological:     Mental Status: He is alert and oriented to person, place, and time.     Cranial Nerves: No cranial nerve deficit.  Psychiatric:        Behavior: Behavior normal.           Assessment & Plan:

## 2018-07-07 NOTE — Patient Instructions (Signed)
Schedule your complete physical in 3-4 months We'll notify you of your lab results and make any changes if needed WORK on healthy diet and regular exercise- you can do it!!! Schedule your eye exam for later this spring Call with any questions or concerns Happy Valentine's Day!!

## 2018-07-07 NOTE — Assessment & Plan Note (Signed)
Chronic problem.  Pt had been doing well on his regimen of Metformin but he admits that he has not been eating right and not exercising.  Foot exam done today.  On ACE for renal protection.  Due for eye exam next month.  Check labs.  Adjust meds prn

## 2018-07-11 ENCOUNTER — Other Ambulatory Visit: Payer: Self-pay | Admitting: Family Medicine

## 2018-07-11 DIAGNOSIS — N522 Drug-induced erectile dysfunction: Secondary | ICD-10-CM

## 2018-07-28 ENCOUNTER — Ambulatory Visit: Payer: BLUE CROSS/BLUE SHIELD | Admitting: Pharmacist

## 2018-07-28 DIAGNOSIS — I4819 Other persistent atrial fibrillation: Secondary | ICD-10-CM

## 2018-07-28 DIAGNOSIS — Z5181 Encounter for therapeutic drug level monitoring: Secondary | ICD-10-CM | POA: Diagnosis not present

## 2018-07-28 DIAGNOSIS — I480 Paroxysmal atrial fibrillation: Secondary | ICD-10-CM

## 2018-07-28 LAB — POCT INR: INR: 2.3 (ref 2.0–3.0)

## 2018-07-28 NOTE — Patient Instructions (Signed)
Continue taking 1/2 tablet (5mg ) every day. Follow up in 6 weeks.  Call our office if placed on any new medications or if you have any concerns [phone].

## 2018-09-07 ENCOUNTER — Ambulatory Visit (INDEPENDENT_AMBULATORY_CARE_PROVIDER_SITE_OTHER): Payer: BLUE CROSS/BLUE SHIELD | Admitting: Pharmacist

## 2018-09-07 ENCOUNTER — Telehealth: Payer: Self-pay | Admitting: Pharmacist

## 2018-09-07 ENCOUNTER — Other Ambulatory Visit: Payer: Self-pay

## 2018-09-07 DIAGNOSIS — I4819 Other persistent atrial fibrillation: Secondary | ICD-10-CM | POA: Diagnosis not present

## 2018-09-07 DIAGNOSIS — I480 Paroxysmal atrial fibrillation: Secondary | ICD-10-CM | POA: Diagnosis not present

## 2018-09-07 LAB — POCT INR: INR: 2.2 (ref 2.0–3.0)

## 2018-09-07 NOTE — Telephone Encounter (Signed)

## 2018-09-16 ENCOUNTER — Other Ambulatory Visit: Payer: Self-pay | Admitting: Cardiology

## 2018-09-16 ENCOUNTER — Other Ambulatory Visit: Payer: Self-pay | Admitting: Family Medicine

## 2018-09-16 NOTE — Telephone Encounter (Signed)
Tambocor 100mg , metoprolol succ 50 mg, pravachol 40 mg, amlodipine 5 mg , HCTZ 12.5 mg and Benazepril 20 mg refilled.

## 2018-09-18 ENCOUNTER — Encounter: Payer: Self-pay | Admitting: Cardiology

## 2018-09-18 NOTE — Telephone Encounter (Signed)
error 

## 2018-10-05 ENCOUNTER — Other Ambulatory Visit: Payer: Self-pay

## 2018-10-05 ENCOUNTER — Other Ambulatory Visit: Payer: Self-pay | Admitting: Cardiology

## 2018-10-27 ENCOUNTER — Telehealth: Payer: Self-pay

## 2018-10-27 NOTE — Telephone Encounter (Signed)
Will discuss home meter at next INR check. Would prefer he change to DOAC therapy instead given young age and affordability ($10/month with commercial insurance), but pt has previously refused. Will discuss at next INR check.

## 2018-10-27 NOTE — Telephone Encounter (Signed)
Called to move the appt pt became irate stating what kind of business are we running. I made the decision to keep his appt the same but the pt requests a home meter to check. Will route to pharm d

## 2018-11-01 ENCOUNTER — Telehealth: Payer: Self-pay | Admitting: Pharmacist

## 2018-11-01 NOTE — Telephone Encounter (Signed)
Patient called needing to reschedule coumadin appointment. Also asking home monitor. Discussed using a DOAC with patient but he still refuses. I have filled out an application for [employer] home monitor. I have faxed it over to [facility] office for Dr. Stanford Breed signature.

## 2018-11-04 ENCOUNTER — Telehealth: Payer: Self-pay

## 2018-11-04 NOTE — Telephone Encounter (Signed)

## 2018-11-09 ENCOUNTER — Telehealth: Payer: Self-pay

## 2018-11-09 NOTE — Telephone Encounter (Signed)
lmom for prescreen  

## 2018-11-11 ENCOUNTER — Telehealth: Payer: Self-pay | Admitting: Cardiology

## 2018-11-12 NOTE — Telephone Encounter (Signed)
smartphone/ my chart/ consent/ pre reg completed  °

## 2018-11-15 ENCOUNTER — Telehealth (INDEPENDENT_AMBULATORY_CARE_PROVIDER_SITE_OTHER): Payer: BC Managed Care – PPO | Admitting: Cardiology

## 2018-11-15 VITALS — BP 139/92 | HR 66 | Ht 70.0 in | Wt 265.0 lb

## 2018-11-15 DIAGNOSIS — I10 Essential (primary) hypertension: Secondary | ICD-10-CM

## 2018-11-15 DIAGNOSIS — I480 Paroxysmal atrial fibrillation: Secondary | ICD-10-CM | POA: Diagnosis not present

## 2018-11-15 DIAGNOSIS — E7800 Pure hypercholesterolemia, unspecified: Secondary | ICD-10-CM

## 2018-11-15 NOTE — Progress Notes (Signed)
Virtual Visit via Video Note   This visit type was conducted due to national recommendations for restrictions regarding the COVID-19 Pandemic (e.g. social distancing) in an effort to limit this patient's exposure and mitigate transmission in our community.  Due to his co-morbid illnesses, this patient is at least at moderate risk for complications without adequate follow up.  This format is felt to be most appropriate for this patient at this time.  All issues noted in this document were discussed and addressed.  A limited physical exam was performed with this format.  Please refer to the patient's chart for his consent to telehealth for [facility].   Date:  11/15/2018   ID:  Daniel Grant, DOB May 04, 1966, MRN [id number]  Patient Location:Home Provider Location: Home  PCP:  Midge Minium, MD  Cardiologist:  Dr Stanford Breed  Evaluation Performed:  Follow-Up Visit  Chief Complaint:  FU atrial fibrillation  History of Present Illness:    FU atrial fibrillation. Cardiac catheterization in January of 2001 showed normal LV function and normal coronary arteries. Nuclear study in June of 2011 showed soft tissue attenuation and no ischemia. Patient has been treated with flecainide for atrial fibrillation. He did have an exercise treadmill in September of 2011thatshowed no exercise induced ventricular tachycardia. Last echocardiogram February 2018 showed normal LV systolic function, moderate diastolic dysfunction, mild right ventricular enlargement. Patient was scheduled for cardioversion2/18but upon arrival was found to be in sinus rhythm. Since last seen,patient denies dyspnea, chest pain, palpitations or syncope.  No bleeding.  The patient does not have symptoms concerning for COVID-19 infection (fever, chills, cough, or new shortness of breath).    Past Medical History:  Diagnosis Date  . DM II (diabetes mellitus, type II), controlled ([place])   . Erectile dysfunction   .  Hyperlipidemia   . Hypertension   . Obesity   . OSA (obstructive sleep apnea)   . Paroxysmal atrial fibrillation [facility])    Past Surgical History:  Procedure Laterality Date  . CARDIAC CATHETERIZATION    . FINGER SURGERY     left 1st finger     Current Meds  Medication Sig  . amLODipine (NORVASC) 5 MG tablet TAKE 1 TABLET BY MOUTH  DAILY  . benazepril (LOTENSIN) 20 MG tablet TAKE 1 TABLET BY MOUTH  DAILY  . flecainide (TAMBOCOR) 100 MG tablet TAKE 1 TABLET BY MOUTH TWO  TIMES DAILY  . fluticasone (FLONASE) 50 MCG/ACT nasal spray Place 2 sprays into both nostrils daily. (Patient taking differently: Place 2 sprays into both nostrils daily as needed. )  . hydrochlorothiazide (HYDRODIURIL) 12.5 MG tablet TAKE 1 TABLET BY MOUTH  DAILY  . metFORMIN (GLUCOPHAGE-XR) 500 MG 24 hr tablet TAKE 2 TABLETS BY MOUTH  ONCE DAILY WITH BREAKFAST  AND 1 TABLET AT NIGHT WITH  SUPPER  . metoprolol succinate (TOPROL-XL) 50 MG 24 hr tablet TAKE 3 TABLETS BY MOUTH  DAILY WITH OR IMMEDIATELY  FOLLOWING A MEAL  . Multiple Vitamin (MULTIVITAMIN) tablet Take 1 tablet by mouth daily.    . Omega-3 Fatty Acids (FISH OIL) 1200 MG CAPS Take 2 capsules by mouth 2 (two) times daily.  . pravastatin (PRAVACHOL) 40 MG tablet TAKE 1 TABLET BY MOUTH  DAILY  . sildenafil (VIAGRA) 100 MG tablet TAKE 1 TABLET BY MOUTH  DAILY AS NEEDED FOR  ERECTILE DYSFUNCTION  . warfarin (COUMADIN) 10 MG tablet TAKE 1/2 TABLET BY MOUTH  DAILY AS DIRECTED BY  ANTICOAGULATION CLINIC     Allergies:  Patient has no known allergies.   Social History   Tobacco Use  . Smoking status: Former Smoker    Quit date: 05/26/1994    Years since quitting: 24.4  . Smokeless tobacco: Never Used  . Tobacco comment: quit in 1995  Substance Use Topics  . Alcohol use: Yes    Alcohol/week: 5.0 standard drinks    Types: 5 Cans of beer per week  . Drug use: No     Family Hx: The patient's family history includes Alcohol abuse in his father; Diabetes in  his mother; Heart attack in his maternal grandmother and paternal uncle; Heart disease in his mother; Hypertension in his mother. There is no history of Cancer, Early death, Hyperlipidemia, Kidney disease, or Stroke.  ROS:   Please see the history of present illness.    No Fever, chills  or productive cough All other systems reviewed and are negative  Recent Labs: 03/05/2018: ALT 23; Hemoglobin 14.4; Platelets 218.0; TSH 0.82 07/07/2018: BUN 24; Creatinine, Ser 1.00; Potassium 4.3; Sodium 138   Recent Lipid Panel Lab Results  Component Value Date/Time   CHOL 133 03/05/2018 02:44 PM   TRIG 169.0 (H) 03/05/2018 02:44 PM   HDL 34.90 (L) 03/05/2018 02:44 PM   CHOLHDL 4 03/05/2018 02:44 PM   LDLCALC 64 03/05/2018 02:44 PM   LDLDIRECT 70.0 07/15/2017 01:47 PM    Wt Readings from Last 3 Encounters:  11/15/18 265 lb (120.2 kg)  07/07/18 282 lb (127.9 kg)  03/05/18 271 lb (122.9 kg)     Objective:    Vital Signs:  BP (!) 139/92   Pulse 66   Ht 5\' 10"  (1.778 m)   Wt 265 lb (120.2 kg)   BMI 38.02 kg/m    VITAL SIGNS:  reviewed NAD Answers questions appropriately Normal affect Remainder of physical examination not performed (telehealth visit; coronavirus pandemic)  ASSESSMENT & PLAN:    1 Paroxysmal atrial fibrillation-by history patient remains in sinus rhythm.  Plan to continue beta-blocker for rate control if atrial fibrillation recurs.  Continue flecainide at present dose.  Continue Coumadin.  Patient does not want to pursue DOACs because of expense.  He will check with his insurance company to see if affordable and if so we will transition to apixaban.  If he stays on Coumadin he would like home monitoring.  His wife is a Engineer, civil (consulting)nurse.  2 hypertension-patient's blood pressure is mildly elevated but he states typically controlled.  Continue present medications and adjust if needed.  3 hyperlipidemia-continue statin.  COVID-19 Education: The importance of social distancing was  discussed today.  Time:   Today, I have spent 18 minutes with the patient with telehealth technology discussing the above problems.     Medication Adjustments/Labs and Tests Ordered: Current medicines are reviewed at length with the patient today.  Concerns regarding medicines are outlined above.   Tests Ordered: No orders of the defined types were placed in this encounter.   Medication Changes: No orders of the defined types were placed in this encounter.   Follow Up:  Virtual Visit or In Person in 6 month(s)  Signed, Olga MillersBrian , MD  11/15/2018 11:12 AM    [facility]

## 2018-11-15 NOTE — Patient Instructions (Signed)
Medication Instructions:  CHECK ON COST OF ELIQUIS If you need a refill on your cardiac medications before your next appointment, please call your pharmacy.   Lab work: If you have labs (blood work) drawn today and your tests are completely normal, you will receive your results only by: Marland Kitchen MyChart Message (if you have MyChart) OR . A paper copy in the mail If you have any lab test that is abnormal or we need to change your treatment, we will call you to review the results.  Follow-Up: At [facility], you and your health needs are our priority.  As part of our continuing mission to provide you with exceptional heart care, we have created designated Provider Care Teams.  These Care Teams include your primary Cardiologist (physician) and Advanced Practice Providers (APPs -  Physician Assistants and Nurse Practitioners) who all work together to provide you with the care you need, when you need it. You will need a follow up appointment in 6 months.  Please call our office 2 months in advance to schedule this appointment.  You may see Kirk Ruths MD or one of the following Advanced Practice Providers on your designated Care Team:   Kerin Ransom, PA-C Roby Lofts, [place] . Sande Rives, PA-C

## 2018-11-16 ENCOUNTER — Telehealth: Payer: BC Managed Care – PPO | Admitting: Cardiology

## 2018-11-29 ENCOUNTER — Telehealth: Payer: Self-pay

## 2018-11-29 NOTE — Telephone Encounter (Signed)

## 2018-12-06 ENCOUNTER — Other Ambulatory Visit: Payer: Self-pay

## 2018-12-06 ENCOUNTER — Ambulatory Visit: Payer: BC Managed Care – PPO | Admitting: Pharmacist

## 2018-12-06 DIAGNOSIS — I4819 Other persistent atrial fibrillation: Secondary | ICD-10-CM | POA: Diagnosis not present

## 2018-12-06 LAB — POCT INR: INR: 1.9 — AB (ref 2.0–3.0)

## 2018-12-06 NOTE — Patient Instructions (Signed)
Description   Take an extra 1/2 tablet today, then continue taking 1/2 tablet (5mg ) every day. Follow up in 6 weeks.  Call our office if placed on any new medications or if you have any concerns [phone].

## 2018-12-11 ENCOUNTER — Other Ambulatory Visit: Payer: Self-pay | Admitting: Cardiology

## 2018-12-15 ENCOUNTER — Ambulatory Visit (INDEPENDENT_AMBULATORY_CARE_PROVIDER_SITE_OTHER): Payer: BC Managed Care – PPO | Admitting: Family Medicine

## 2018-12-15 ENCOUNTER — Other Ambulatory Visit: Payer: Self-pay

## 2018-12-15 ENCOUNTER — Encounter: Payer: Self-pay | Admitting: Family Medicine

## 2018-12-15 VITALS — BP 130/89 | HR 63 | Temp 97.1°F | Resp 16 | Ht 70.0 in | Wt 265.4 lb

## 2018-12-15 DIAGNOSIS — Z125 Encounter for screening for malignant neoplasm of prostate: Secondary | ICD-10-CM

## 2018-12-15 DIAGNOSIS — E119 Type 2 diabetes mellitus without complications: Secondary | ICD-10-CM | POA: Diagnosis not present

## 2018-12-15 DIAGNOSIS — Z0000 Encounter for general adult medical examination without abnormal findings: Secondary | ICD-10-CM | POA: Diagnosis not present

## 2018-12-15 LAB — CBC WITH DIFFERENTIAL/PLATELET
Basophils Absolute: 0.1 10*3/uL (ref 0.0–0.1)
Basophils Relative: 0.8 % (ref 0.0–3.0)
Eosinophils Absolute: 0.3 10*3/uL (ref 0.0–0.7)
Eosinophils Relative: 2.8 % (ref 0.0–5.0)
HCT: 41.6 % (ref 39.0–52.0)
Hemoglobin: 14.3 g/dL (ref 13.0–17.0)
Lymphocytes Relative: 26.8 % (ref 12.0–46.0)
Lymphs Abs: 2.8 10*3/uL (ref 0.7–4.0)
MCHC: 34.3 g/dL (ref 30.0–36.0)
MCV: 84.1 fl (ref 78.0–100.0)
Monocytes Absolute: 0.8 10*3/uL (ref 0.1–1.0)
Monocytes Relative: 8 % (ref 3.0–12.0)
Neutro Abs: 6.4 10*3/uL (ref 1.4–7.7)
Neutrophils Relative %: 61.6 % (ref 43.0–77.0)
Platelets: 216 10*3/uL (ref 150.0–400.0)
RBC: 4.95 Mil/uL (ref 4.22–5.81)
RDW: 13.1 % (ref 11.5–15.5)
WBC: 10.4 10*3/uL (ref 4.0–10.5)

## 2018-12-15 LAB — BASIC METABOLIC PANEL
BUN: 26 mg/dL — ABNORMAL HIGH (ref 6–23)
CO2: 26 mEq/L (ref 19–32)
Calcium: 9.3 mg/dL (ref 8.4–10.5)
Chloride: 104 mEq/L (ref 96–112)
Creatinine, Ser: 0.92 mg/dL (ref 0.40–1.50)
GFR: 103.23 mL/min (ref >60.00)
Glucose, Bld: 89 mg/dL (ref 70–99)
Potassium: 4.1 mEq/L (ref 3.5–5.1)
Sodium: 140 mEq/L (ref 135–145)

## 2018-12-15 LAB — LIPID PANEL
Cholesterol: 142 mg/dL (ref 0–200)
HDL: 31.9 mg/dL — ABNORMAL LOW (ref >39.00)
NonHDL: 110.45
Total CHOL/HDL Ratio: 4
Triglycerides: 301 mg/dL — ABNORMAL HIGH (ref 0.0–149.0)
VLDL: 60.2 mg/dL — ABNORMAL HIGH (ref 0.0–40.0)

## 2018-12-15 LAB — TSH: TSH: 0.96 u[IU]/mL (ref 0.35–4.50)

## 2018-12-15 LAB — PSA: PSA: 0.53 ng/mL (ref 0.10–4.00)

## 2018-12-15 LAB — HEPATIC FUNCTION PANEL
ALT: 22 U/L (ref 0–53)
AST: 16 U/L (ref 0–37)
Albumin: 4.2 g/dL (ref 3.5–5.2)
Alkaline Phosphatase: 82 U/L (ref 39–117)
Bilirubin, Direct: 0.1 mg/dL (ref 0.0–0.3)
Total Bilirubin: 0.6 mg/dL (ref 0.2–1.2)
Total Protein: 6.3 g/dL (ref 6.0–8.3)

## 2018-12-15 LAB — LDL CHOLESTEROL, DIRECT: Direct LDL: 80 mg/dL

## 2018-12-15 LAB — HEMOGLOBIN A1C: Hgb A1c MFr Bld: 7.3 % — ABNORMAL HIGH (ref 4.6–6.5)

## 2018-12-15 NOTE — Progress Notes (Signed)
   Subjective:    Patient ID: Daniel Grant, male    DOB: 09/18/1965, 53 y.o.   MRN: [id number]  HPI CPE- UTD on colonoscopy, immunizations.  Plans to reschedule eye exam (cancelled due to COVID)   Review of Systems Patient reports no vision/hearing changes, anorexia, fever ,adenopathy, persistant/recurrent hoarseness, swallowing issues, chest pain, palpitations, edema, persistant/recurrent cough, hemoptysis, dyspnea (rest,exertional, paroxysmal nocturnal), gastrointestinal  bleeding (melena, rectal bleeding), abdominal pain, excessive heart burn, GU symptoms (dysuria, hematuria, voiding/incontinence issues) syncope, focal weakness, memory loss, numbness & tingling, skin/hair/nail changes, depression, anxiety, abnormal bruising/bleeding, musculoskeletal symptoms/signs.     Objective:   Physical Exam General Appearance:    Alert, cooperative, no distress, appears stated age, obese  Head:    Normocephalic, without obvious abnormality, atraumatic  Eyes:    PERRL, conjunctiva/corneas clear, EOM's intact, fundi    benign, both eyes       Ears:    Normal TM's and external ear canals, both ears  Nose:   Deferred due to COVID  Throat:   Neck:   Supple, symmetrical, trachea midline, no adenopathy;       thyroid:  No enlargement/tenderness/nodules  Back:     Symmetric, no curvature, ROM normal, no CVA tenderness  Lungs:     Clear to auscultation bilaterally, respirations unlabored  Chest wall:    No tenderness or deformity  Heart:    Regular rate and rhythm, S1 and S2 normal, no murmur, rub   or gallop  Abdomen:     Soft, non-tender, bowel sounds active all four quadrants,    no masses, no organomegaly  Genitalia:    Deferred at pt's request  Rectal:    Extremities:   Extremities normal, atraumatic, no cyanosis or edema  Pulses:   2+ and symmetric all extremities  Skin:   Skin color, texture, turgor normal, no rashes or lesions  Lymph nodes:   Cervical, supraclavicular, and axillary nodes  normal  Neurologic:   CNII-XII intact. Normal strength, sensation and reflexes      throughout          Assessment & Plan:

## 2018-12-15 NOTE — Assessment & Plan Note (Signed)
Pt's PE WNL w/ exception of obesity.  Pt declined GU exam today.  Will check PSA.  Check labs.  Anticipatory guidance provided.

## 2018-12-15 NOTE — Assessment & Plan Note (Signed)
Ongoing issue for pt.  He has dropped 20 lbs since Feb.  Applauded his efforts.  Encouraged him to continue healthy diet and regular exercise.  Will follow.

## 2018-12-15 NOTE — Assessment & Plan Note (Signed)
Chronic problem.  UTD on foot exam, on ACE for renal protection.  Due for eye exam.  Pt plans to reschedule due to COVID cancellation.  Check labs.  Adjust meds prn

## 2018-12-15 NOTE — Patient Instructions (Addendum)
Follow up in 3-4 months to recheck diabetes We'll notify you of your lab results and make any changes if needed Continue to work on healthy diet and regular exercise- you can do it!! Reschedule your eye exam at your convenience and have them send me the results Call with any questions or concerns Stay Safe!!!

## 2018-12-21 ENCOUNTER — Other Ambulatory Visit: Payer: Self-pay | Admitting: Family Medicine

## 2018-12-21 DIAGNOSIS — N522 Drug-induced erectile dysfunction: Secondary | ICD-10-CM

## 2019-01-06 ENCOUNTER — Other Ambulatory Visit: Payer: Self-pay | Admitting: Pharmacist

## 2019-01-06 ENCOUNTER — Encounter: Payer: Self-pay | Admitting: Family Medicine

## 2019-01-06 ENCOUNTER — Other Ambulatory Visit: Payer: Self-pay

## 2019-01-06 DIAGNOSIS — N522 Drug-induced erectile dysfunction: Secondary | ICD-10-CM

## 2019-01-06 MED ORDER — AMLODIPINE BESYLATE 5 MG PO TABS: 5.0000 mg | ORAL_TABLET | Freq: Every day | ORAL | 0 refills | Status: DC

## 2019-01-06 MED ORDER — PRAVASTATIN SODIUM 40 MG PO TABS: 40.0000 mg | ORAL_TABLET | Freq: Every day | ORAL | 0 refills | Status: DC

## 2019-01-06 MED ORDER — FLECAINIDE ACETATE 100 MG PO TABS: 100.0000 mg | ORAL_TABLET | Freq: Two times a day (BID) | ORAL | 0 refills | Status: DC

## 2019-01-06 MED ORDER — HYDROCHLOROTHIAZIDE 12.5 MG PO TABS: 12.5000 mg | ORAL_TABLET | Freq: Every day | ORAL | 0 refills | Status: DC

## 2019-01-06 MED ORDER — BENAZEPRIL HCL 20 MG PO TABS: 20.0000 mg | ORAL_TABLET | Freq: Every day | ORAL | 0 refills | Status: DC

## 2019-01-06 MED ORDER — METOPROLOL SUCCINATE ER 50 MG PO TB24: 50.0000 mg | ORAL_TABLET | Freq: Every day | ORAL | 0 refills | Status: DC

## 2019-01-06 MED ORDER — WARFARIN SODIUM 10 MG PO TABS: ORAL_TABLET | ORAL | 1 refills | Status: DC

## 2019-01-06 NOTE — Telephone Encounter (Signed)
Last OV 12/15/18 Sildenafil last filled 12/21/18 #9 with 1   [facility]. Pt has never received would like this to go to [facility].

## 2019-01-07 MED ORDER — METFORMIN HCL ER 500 MG PO TB24: ORAL_TABLET | ORAL | 1 refills | Status: DC

## 2019-01-07 MED ORDER — SILDENAFIL CITRATE 100 MG PO TABS: ORAL_TABLET | ORAL | 1 refills | Status: DC

## 2019-01-09 ENCOUNTER — Encounter: Payer: Self-pay | Admitting: Family Medicine

## 2019-01-11 MED ORDER — METOPROLOL SUCCINATE ER 50 MG PO TB24: ORAL_TABLET | ORAL | 1 refills | Status: DC

## 2019-01-11 MED ORDER — METOPROLOL SUCCINATE ER 50 MG PO TB24: 50.0000 mg | ORAL_TABLET | Freq: Every day | ORAL | 1 refills | Status: DC

## 2019-01-24 ENCOUNTER — Ambulatory Visit (INDEPENDENT_AMBULATORY_CARE_PROVIDER_SITE_OTHER): Payer: 59

## 2019-01-24 ENCOUNTER — Other Ambulatory Visit: Payer: Self-pay

## 2019-01-24 DIAGNOSIS — I4819 Other persistent atrial fibrillation: Secondary | ICD-10-CM

## 2019-01-24 LAB — POCT INR: INR: 3.2 — AB (ref 2.0–3.0)

## 2019-01-24 NOTE — Patient Instructions (Signed)
Description   Do not take your warfarin today, then starting tomorrow, continue taking 1/2 tablet (5mg ) every day. Follow up in 4-5 weeks.  Call our office if placed on any new medications or if you have any concerns [phone]

## 2019-02-11 DIAGNOSIS — I480 Paroxysmal atrial fibrillation: Secondary | ICD-10-CM | POA: Diagnosis not present

## 2019-02-11 DIAGNOSIS — Z7901 Long term (current) use of anticoagulants: Secondary | ICD-10-CM | POA: Diagnosis not present

## 2019-02-23 ENCOUNTER — Telehealth: Payer: Self-pay | Admitting: Pharmacist

## 2019-02-23 NOTE — Telephone Encounter (Signed)
I called the patient to report requested training  He stated" he doesn't need training and that we are too late" then proceeded to interrupt the conversation and said "is simple, I HAVE THE MACHINE AND I AM NOT COMING" and terminated the call.  Order for [place] training cancelled.

## 2019-02-23 NOTE — Telephone Encounter (Signed)
Pt called Coumadin clinic to cancel upcoming appt for next week. States he received his home INR machine and will no longer need to come in to the office. Reports no one has come out to train him yet - advised that he call the company to coordinate this. Provided pt with [facility] # for future telephonic INR dosing since pt follows with Dr Stanford Breed.

## 2019-02-23 NOTE — Telephone Encounter (Signed)
Patient originally stated he knew how to use the machine and didn't need training.  I called Daniel Grant at [employer] connects to request tranning. Will be coordinated ASAP.

## 2019-02-27 LAB — POCT INR: INR: 2.5 (ref 2.0–3.0)

## 2019-02-28 ENCOUNTER — Ambulatory Visit (INDEPENDENT_AMBULATORY_CARE_PROVIDER_SITE_OTHER): Payer: 59 | Admitting: Cardiovascular Disease

## 2019-02-28 DIAGNOSIS — I480 Paroxysmal atrial fibrillation: Secondary | ICD-10-CM

## 2019-03-05 ENCOUNTER — Other Ambulatory Visit: Payer: Self-pay | Admitting: Cardiology

## 2019-03-13 DIAGNOSIS — Z7901 Long term (current) use of anticoagulants: Secondary | ICD-10-CM | POA: Diagnosis not present

## 2019-03-13 DIAGNOSIS — I480 Paroxysmal atrial fibrillation: Secondary | ICD-10-CM | POA: Diagnosis not present

## 2019-03-23 LAB — POCT INR: INR: 3 (ref 2.0–3.0)

## 2019-03-24 ENCOUNTER — Ambulatory Visit (INDEPENDENT_AMBULATORY_CARE_PROVIDER_SITE_OTHER): Payer: 59 | Admitting: Cardiology

## 2019-03-24 DIAGNOSIS — I4891 Unspecified atrial fibrillation: Secondary | ICD-10-CM | POA: Diagnosis not present

## 2019-03-24 DIAGNOSIS — Z5181 Encounter for therapeutic drug level monitoring: Secondary | ICD-10-CM | POA: Diagnosis not present

## 2019-04-12 DIAGNOSIS — I480 Paroxysmal atrial fibrillation: Secondary | ICD-10-CM | POA: Diagnosis not present

## 2019-04-12 DIAGNOSIS — Z7901 Long term (current) use of anticoagulants: Secondary | ICD-10-CM | POA: Diagnosis not present

## 2019-04-15 ENCOUNTER — Encounter: Payer: Self-pay | Admitting: Family Medicine

## 2019-04-15 ENCOUNTER — Ambulatory Visit (INDEPENDENT_AMBULATORY_CARE_PROVIDER_SITE_OTHER): Payer: 59 | Admitting: Family Medicine

## 2019-04-15 ENCOUNTER — Other Ambulatory Visit: Payer: Self-pay

## 2019-04-15 ENCOUNTER — Ambulatory Visit: Payer: BC Managed Care – PPO | Admitting: Family Medicine

## 2019-04-15 ENCOUNTER — Other Ambulatory Visit: Payer: Self-pay | Admitting: Family Medicine

## 2019-04-15 VITALS — BP 122/73 | HR 73

## 2019-04-15 DIAGNOSIS — E119 Type 2 diabetes mellitus without complications: Secondary | ICD-10-CM | POA: Diagnosis not present

## 2019-04-15 NOTE — Progress Notes (Signed)
Virtual Visit via Video   I connected with patient on 04/15/19 at  4:15 PM EST by a video enabled telemedicine application and verified that I am speaking with the correct person using two identifiers.  Location patient: Home Location provider: [facility], Office Persons participating in the virtual visit: Patient, Provider, [place] (Jess B)  I discussed the limitations of evaluation and management by telemedicine and the availability of in person appointments. The patient expressed understanding and agreed to proceed.  Subjective:   HPI:   DM- chronic problem, on Metformin XR 1000mg  QAM and 500mg  QPM.  On ACE for renal protection.  UTD on foot exam.  Due for eye exam.  Denies symptomatic lows.  Not checking CBGs.  No numbness/tingling.  No CP, SOB, HAs, visual changes, abd pain, N/V.  ROS:   See pertinent positives and negatives per HPI.  Patient Active Problem List   Diagnosis Date Noted  . OSA (obstructive sleep apnea) 06/16/2016  . Hypertriglyceridemia, essential 06/06/2015  . Encounter for therapeutic drug monitoring 07/26/2013  . Erectile dysfunction 12/10/2011  . Routine general medical examination at a health care facility 09/08/2011  . Diabetes mellitus type 2, controlled ([place]) 08/29/2008  . Hyperlipidemia with target LDL less than 100 08/29/2008  . Morbid obesity ([place]) 08/29/2008  . Essential hypertension 08/29/2008  . ATRIAL FIBRILLATION, PAROXYSMAL, CHRONIC 08/29/2008  . Sleep apnea 08/29/2008    Social History   Tobacco Use  . Smoking status: Former Smoker    Quit date: 05/26/1994    Years since quitting: 24.9  . Smokeless tobacco: Never Used  . Tobacco comment: quit in 1995  Substance Use Topics  . Alcohol use: Yes    Alcohol/week: 5.0 standard drinks    Types: 5 Cans of beer per week    Current Outpatient Medications:  .  amLODipine (NORVASC) 5 MG tablet, Take 1 tablet by mouth once daily, Disp: 90 tablet, Rfl: 2 .  benazepril (LOTENSIN) 20 MG  tablet, Take 1 tablet (20 mg total) by mouth daily., Disp: 90 tablet, Rfl: 0 .  flecainide (TAMBOCOR) 100 MG tablet, Take 1 tablet (100 mg total) by mouth 2 (two) times daily., Disp: 180 tablet, Rfl: 0 .  fluticasone (FLONASE) 50 MCG/ACT nasal spray, Place 2 sprays into both nostrils daily. (Patient taking differently: Place 2 sprays into both nostrils daily as needed. ), Disp: 16 g, Rfl: 5 .  hydrochlorothiazide (HYDRODIURIL) 12.5 MG tablet, Take 1 tablet by mouth once daily, Disp: 90 tablet, Rfl: 2 .  metFORMIN (GLUCOPHAGE-XR) 500 MG 24 hr tablet, Take 2 tablets by mouth before breakfast and 1 tablet with supper daily., Disp: 270 tablet, Rfl: 1 .  metoprolol succinate (TOPROL-XL) 50 MG 24 hr tablet, TAKE 3 TABLETS BY MOUTH  DAILY WITH OR IMMEDIATELY  FOLLOWING A MEAL, Disp: 270 tablet, Rfl: 1 .  Multiple Vitamin (MULTIVITAMIN) tablet, Take 1 tablet by mouth daily.  , Disp: , Rfl:  .  Omega-3 Fatty Acids (FISH OIL) 1200 MG CAPS, Take 2 capsules by mouth 2 (two) times daily., Disp: , Rfl:  .  pravastatin (PRAVACHOL) 40 MG tablet, Take 1 tablet (40 mg total) by mouth daily., Disp: 90 tablet, Rfl: 0 .  sildenafil (VIAGRA) 100 MG tablet, TAKE 1 TABLET BY MOUTH  DAILY AS NEEDED FOR  ERECTILE DYSFUNCTION, Disp: 9 tablet, Rfl: 1 .  warfarin (COUMADIN) 10 MG tablet, Take 1/2 to 1 tablet daily as directed by coumadin clinic, Disp: 45 tablet, Rfl: 1  No Known Allergies  Objective:   BP 122/73   Pulse 73  AAOx3, NAD NCAT, EOMI No obvious CN deficits Coloring WNL Pt is able to speak clearly, coherently without shortness of breath or increased work of breathing.  Thought process is linear.  Mood is appropriate.   Assessment and Plan:   DM- chronic problem.  Hx of adequate control.  Currently asymptomatic.  Due for eye exam- encouraged him to schedule.  Pt will go to [facility] for labs next week.  Adjust meds prn.   Neena Rhymes, MD 04/15/2019

## 2019-04-15 NOTE — Progress Notes (Signed)
I have discussed the procedure for the virtual visit with the patient who has given consent to proceed with assessment and treatment.   Jessica L Brodmerkel, CMA     

## 2019-04-18 ENCOUNTER — Other Ambulatory Visit (INDEPENDENT_AMBULATORY_CARE_PROVIDER_SITE_OTHER): Payer: 59

## 2019-04-18 ENCOUNTER — Ambulatory Visit: Payer: 59

## 2019-04-18 ENCOUNTER — Other Ambulatory Visit: Payer: Self-pay

## 2019-04-18 DIAGNOSIS — E119 Type 2 diabetes mellitus without complications: Secondary | ICD-10-CM

## 2019-04-18 LAB — BASIC METABOLIC PANEL WITH GFR
BUN: 21 mg/dL (ref 6–23)
CO2: 26 meq/L (ref 19–32)
Calcium: 9.6 mg/dL (ref 8.4–10.5)
Chloride: 106 meq/L (ref 96–112)
Creatinine, Ser: 1.01 mg/dL (ref 0.40–1.50)
GFR: 92.58 mL/min
Glucose, Bld: 101 mg/dL — ABNORMAL HIGH (ref 70–99)
Potassium: 4 meq/L (ref 3.5–5.1)
Sodium: 142 meq/L (ref 135–145)

## 2019-04-18 LAB — HEMOGLOBIN A1C: Hgb A1c MFr Bld: 6.7 % — ABNORMAL HIGH (ref 4.6–6.5)

## 2019-04-19 ENCOUNTER — Encounter: Payer: Self-pay | Admitting: General Practice

## 2019-04-23 LAB — POCT INR: INR: 1.6 — AB (ref 2.0–3.0)

## 2019-04-25 ENCOUNTER — Ambulatory Visit (INDEPENDENT_AMBULATORY_CARE_PROVIDER_SITE_OTHER): Payer: 59 | Admitting: Internal Medicine

## 2019-04-25 DIAGNOSIS — I4891 Unspecified atrial fibrillation: Secondary | ICD-10-CM | POA: Diagnosis not present

## 2019-05-07 LAB — POCT INR: INR: 1.6 — AB (ref 2.0–3.0)

## 2019-05-09 ENCOUNTER — Ambulatory Visit (INDEPENDENT_AMBULATORY_CARE_PROVIDER_SITE_OTHER): Payer: 59 | Admitting: Cardiovascular Disease

## 2019-05-09 DIAGNOSIS — I4891 Unspecified atrial fibrillation: Secondary | ICD-10-CM | POA: Diagnosis not present

## 2019-05-13 ENCOUNTER — Telehealth (INDEPENDENT_AMBULATORY_CARE_PROVIDER_SITE_OTHER): Payer: 59 | Admitting: Cardiology

## 2019-05-13 ENCOUNTER — Telehealth: Payer: Self-pay

## 2019-05-13 ENCOUNTER — Encounter: Payer: Self-pay | Admitting: Cardiology

## 2019-05-13 VITALS — BP 96/77 | HR 98 | Ht 70.5 in | Wt 281.0 lb

## 2019-05-13 DIAGNOSIS — Z7901 Long term (current) use of anticoagulants: Secondary | ICD-10-CM | POA: Diagnosis not present

## 2019-05-13 DIAGNOSIS — I10 Essential (primary) hypertension: Secondary | ICD-10-CM | POA: Diagnosis not present

## 2019-05-13 DIAGNOSIS — Z7984 Long term (current) use of oral hypoglycemic drugs: Secondary | ICD-10-CM

## 2019-05-13 DIAGNOSIS — E119 Type 2 diabetes mellitus without complications: Secondary | ICD-10-CM | POA: Diagnosis not present

## 2019-05-13 DIAGNOSIS — R079 Chest pain, unspecified: Secondary | ICD-10-CM | POA: Insufficient documentation

## 2019-05-13 DIAGNOSIS — Z7189 Other specified counseling: Secondary | ICD-10-CM | POA: Diagnosis not present

## 2019-05-13 DIAGNOSIS — E785 Hyperlipidemia, unspecified: Secondary | ICD-10-CM

## 2019-05-13 DIAGNOSIS — I480 Paroxysmal atrial fibrillation: Secondary | ICD-10-CM

## 2019-05-13 DIAGNOSIS — G4733 Obstructive sleep apnea (adult) (pediatric): Secondary | ICD-10-CM

## 2019-05-13 DIAGNOSIS — Z5181 Encounter for therapeutic drug level monitoring: Secondary | ICD-10-CM | POA: Insufficient documentation

## 2019-05-13 MED ORDER — HYDROCHLOROTHIAZIDE 12.5 MG PO TABS: 12.5000 mg | ORAL_TABLET | Freq: Every day | ORAL | 3 refills | Status: DC

## 2019-05-13 MED ORDER — BENAZEPRIL HCL 20 MG PO TABS: 20.0000 mg | ORAL_TABLET | Freq: Every day | ORAL | 3 refills | Status: DC

## 2019-05-13 MED ORDER — PRAVASTATIN SODIUM 40 MG PO TABS: 40.0000 mg | ORAL_TABLET | Freq: Every day | ORAL | 3 refills | Status: DC

## 2019-05-13 MED ORDER — FLECAINIDE ACETATE 100 MG PO TABS: 100.0000 mg | ORAL_TABLET | Freq: Two times a day (BID) | ORAL | 3 refills | Status: DC

## 2019-05-13 MED ORDER — METOPROLOL SUCCINATE ER 50 MG PO TB24: ORAL_TABLET | ORAL | 1 refills | Status: DC

## 2019-05-13 MED ORDER — AMLODIPINE BESYLATE 5 MG PO TABS: 5.0000 mg | ORAL_TABLET | Freq: Every day | ORAL | 3 refills | Status: DC

## 2019-05-13 NOTE — Assessment & Plan Note (Signed)
On Coumadin-CHADS VASC=2- HTN, DM He prefers to stay on Coumadin as opposed to a NOAC.  He follows his own INR at home and knows his INR goal is 2-3

## 2019-05-13 NOTE — Telephone Encounter (Signed)
LEFT DETAILED MESSAGE WITH [facility]'S INSTRUCTIONS FOR FOLLOW UP ADVISED PATIENT TO CONTACT THE OFFICE WITH ANY QUESTIONS OR CONCERNS.

## 2019-05-13 NOTE — Assessment & Plan Note (Signed)
Normal cors 2001, negative Nuclear stress 2011

## 2019-05-13 NOTE — Assessment & Plan Note (Signed)
Holding NSR on Flecainide

## 2019-05-13 NOTE — Patient Instructions (Signed)
Medication Instructions:  Your physician recommends that you continue on your current medications as directed. Please refer to the Current Medication list given to you today. *If you need a refill on your cardiac medications before your next appointment, please call your pharmacy*  Lab Work: NONE  If you have labs (blood work) drawn today and your tests are completely normal, you will receive your results only by: Marland Kitchen MyChart Message (if you have MyChart) OR . A paper copy in the mail If you have any lab test that is abnormal or we need to change your treatment, we will call you to review the results.  Testing/Procedures: NONE   Follow-Up: At [facility], you and your health needs are our priority.  As part of our continuing mission to provide you with exceptional heart care, we have created designated Provider Care Teams.  These Care Teams include your primary Cardiologist (physician) and Advanced Practice Providers (APPs -  Physician Assistants and Nurse Practitioners) who all work together to provide you with the care you need, when you need it.  Your next appointment:   6 month(s)  The format for your next appointment:   In Person  Provider:   Kirk Ruths, MD  Other Instructions

## 2019-05-13 NOTE — Assessment & Plan Note (Signed)
On statin Rx, LDL 80 in July 2020

## 2019-05-13 NOTE — Progress Notes (Signed)
Virtual Visit via Video Note   This visit type was conducted due to national recommendations for restrictions regarding the COVID-19 Pandemic (e.g. social distancing) in an effort to limit this patient's exposure and mitigate transmission in our community.  Due to his co-morbid illnesses, this patient is at least at moderate risk for complications without adequate follow up.  This format is felt to be most appropriate for this patient at this time.  All issues noted in this document were discussed and addressed.  A limited physical exam was performed with this format.  Please refer to the patient's chart for his consent to telehealth for [facility].   Date:  05/13/2019   ID:  Daniel Grant, DOB March 27, 1966, MRN [id number]  Patient Location: Home Provider Location: Office  PCP:  Sheliah Hatch, MD  Cardiologist:  Dr Jens Som Electrophysiologist:  None   Evaluation Performed:  Follow-Up Visit  Chief Complaint:  none  History of Present Illness:    Daniel Grant is a 52 y.o. male with a history of normal coronaries in 2001, low risk Myoview in 2011, and normal LV function by echocardiogram in 2018.  He has a history of PAF and is on flecainide.  He has been holding sinus rhythm.  He is on Coumadin and now follows his INR at home, he prefers to stay on Coumadin as opposed to an NOAC.  He saw Dr. Jens Som last in June 2020 and was contacted today for a 28-month follow-up.  Drives a front end loader truck.  He has been doing well since we saw him last.  He is not had any problems with atrial fibrillation and denies any chest pain.  His current medications were reviewed.  I reviewed his INR goal with him.  I did note that his last INR was 1.6.  The patient does not have symptoms concerning for COVID-19 infection (fever, chills, cough, or new shortness of breath).    Past Medical History:  Diagnosis Date  . DM II (diabetes mellitus, type II), controlled (HCC)   . Erectile  dysfunction   . Hyperlipidemia   . Hypertension   . Obesity   . OSA (obstructive sleep apnea)   . Paroxysmal atrial fibrillation [facility]    Past Surgical History:  Procedure Laterality Date  . CARDIAC CATHETERIZATION    . FINGER SURGERY     left 1st finger     Current Meds  Medication Sig  . amLODipine (NORVASC) 5 MG tablet Take 1 tablet (5 mg total) by mouth daily.  . benazepril (LOTENSIN) 20 MG tablet Take 1 tablet (20 mg total) by mouth daily.  . flecainide (TAMBOCOR) 100 MG tablet Take 1 tablet (100 mg total) by mouth 2 (two) times daily.  . fluticasone (FLONASE) 50 MCG/ACT nasal spray Place 2 sprays into both nostrils daily. (Patient taking differently: Place 2 sprays into both nostrils daily as needed. )  . hydrochlorothiazide (HYDRODIURIL) 12.5 MG tablet Take 1 tablet (12.5 mg total) by mouth daily.  . metFORMIN (GLUCOPHAGE-XR) 500 MG 24 hr tablet Take 2 tablets by mouth before breakfast and 1 tablet with supper daily.  . metoprolol succinate (TOPROL-XL) 50 MG 24 hr tablet TAKE 3 TABLETS BY MOUTH  DAILY WITH OR IMMEDIATELY  FOLLOWING A MEAL  . Multiple Vitamin (MULTIVITAMIN) tablet Take 1 tablet by mouth daily.    . Omega-3 Fatty Acids (FISH OIL) 1200 MG CAPS Take 2 capsules by mouth 2 (two) times daily.  . pravastatin (PRAVACHOL) 40 MG tablet  Take 1 tablet (40 mg total) by mouth daily.  . sildenafil (VIAGRA) 100 MG tablet TAKE 1 TABLET BY MOUTH  DAILY AS NEEDED FOR  ERECTILE DYSFUNCTION  . warfarin (COUMADIN) 10 MG tablet Take 1/2 to 1 tablet daily as directed by coumadin clinic  . [DISCONTINUED] amLODipine (NORVASC) 5 MG tablet Take 1 tablet by mouth once daily  . [DISCONTINUED] benazepril (LOTENSIN) 20 MG tablet Take 1 tablet (20 mg total) by mouth daily.  . [DISCONTINUED] flecainide (TAMBOCOR) 100 MG tablet Take 1 tablet (100 mg total) by mouth 2 (two) times daily.  . [DISCONTINUED] hydrochlorothiazide (HYDRODIURIL) 12.5 MG tablet Take 1 tablet by mouth once daily  .  [DISCONTINUED] metoprolol succinate (TOPROL-XL) 50 MG 24 hr tablet TAKE 3 TABLETS BY MOUTH  DAILY WITH OR IMMEDIATELY  FOLLOWING A MEAL  . [DISCONTINUED] pravastatin (PRAVACHOL) 40 MG tablet Take 1 tablet (40 mg total) by mouth daily.     Allergies:   Patient has no known allergies.   Social History   Tobacco Use  . Smoking status: Former Smoker    Quit date: 05/26/1994    Years since quitting: 24.9  . Smokeless tobacco: Never Used  . Tobacco comment: quit in 1995  Substance Use Topics  . Alcohol use: Yes    Alcohol/week: 5.0 standard drinks    Types: 5 Cans of beer per week  . Drug use: No     Family Hx: The patient's family history includes Alcohol abuse in his father; Diabetes in his mother; Heart attack in his maternal grandmother and paternal uncle; Heart disease in his mother; Hypertension in his mother. There is no history of Cancer, Early death, Hyperlipidemia, Kidney disease, or Stroke.  ROS:   Please see the history of present illness.    All other systems reviewed and are negative.   Prior CV studies:   The following studies were reviewed today:  Echo 2018  Labs/Other Tests and Data Reviewed:    EKG:  An ECG dated 10/23/2017 was personally reviewed today and demonstrated:  NSR-HR 62  Recent Labs: 12/15/2018: ALT 22; Hemoglobin 14.3; Platelets 216.0; TSH 0.96 04/18/2019: BUN 21; Creatinine, Ser 1.01; Potassium 4.0; Sodium 142   Recent Lipid Panel Lab Results  Component Value Date/Time   CHOL 142 12/15/2018 01:59 PM   TRIG 301.0 (H) 12/15/2018 01:59 PM   HDL 31.90 (L) 12/15/2018 01:59 PM   CHOLHDL 4 12/15/2018 01:59 PM   LDLCALC 64 03/05/2018 02:44 PM   LDLDIRECT 80.0 12/15/2018 01:59 PM    Wt Readings from Last 3 Encounters:  05/13/19 281 lb (127.5 kg)  12/15/18 265 lb 6 oz (120.4 kg)  11/15/18 265 lb (120.2 kg)     Objective:    Vital Signs:  BP 96/77   Pulse 98   Ht 5' 10.5" (1.791 m)   Wt 281 lb (127.5 kg)   BMI 39.75 kg/m    VITAL SIGNS:   reviewed  ASSESSMENT & PLAN:    PAF (paroxysmal atrial fibrillation) (HCC) Holding NSR on Flecainide  Chronic anticoagulation On Coumadin-CHADS VASC=2- HTN, DM He prefers to stay on Coumadin as opposed to a NOAC.  He follows his own INR at home and knows his INR goal is 2-3  Essential hypertension Controlled- normal LVF on echo 2018  Diabetes mellitus type 2, controlled On oral agents- followed by PCP  normal coronary angiography 2001 Normal cors 2001, negative Nuclear stress 2011  Hyperlipidemia with target LDL less than 100 On statin Rx, LDL 80 in July  2020  COVID-19 Education: The signs and symptoms of COVID-19 were discussed with the patient and how to seek care for testing (follow up with PCP or arrange E-visit).  The importance of social distancing was discussed today.  Time:   Today, I have spent 10 minutes with the patient with telehealth technology discussing the above problems.     Medication Adjustments/Labs and Tests Ordered: Current medicines are reviewed at length with the patient today.  Concerns regarding medicines are outlined above.   Tests Ordered: No orders of the defined types were placed in this encounter.   Medication Changes: Meds ordered this encounter  Medications  . metoprolol succinate (TOPROL-XL) 50 MG 24 hr tablet    Sig: TAKE 3 TABLETS BY MOUTH  DAILY WITH OR IMMEDIATELY  FOLLOWING A MEAL    Dispense:  270 tablet    Refill:  1  . hydrochlorothiazide (HYDRODIURIL) 12.5 MG tablet    Sig: Take 1 tablet (12.5 mg total) by mouth daily.    Dispense:  90 tablet    Refill:  3  . flecainide (TAMBOCOR) 100 MG tablet    Sig: Take 1 tablet (100 mg total) by mouth 2 (two) times daily.    Dispense:  180 tablet    Refill:  3  . pravastatin (PRAVACHOL) 40 MG tablet    Sig: Take 1 tablet (40 mg total) by mouth daily.    Dispense:  90 tablet    Refill:  3  . amLODipine (NORVASC) 5 MG tablet    Sig: Take 1 tablet (5 mg total) by mouth daily.     Dispense:  90 tablet    Refill:  3  . benazepril (LOTENSIN) 20 MG tablet    Sig: Take 1 tablet (20 mg total) by mouth daily.    Dispense:  90 tablet    Refill:  3    Follow Up:  In Person Dr Jens Somrenshaw in 6 months  Signed, Corine ShelterLuke Newt Leving[place], Cordelia Poche-C  05/13/2019 3:30 PM    [facility]

## 2019-05-13 NOTE — Assessment & Plan Note (Signed)
On oral agents followed by PCP. 

## 2019-05-13 NOTE — Assessment & Plan Note (Signed)
Controlled- normal LVF on echo 2018

## 2019-05-13 NOTE — Telephone Encounter (Signed)
Virtual Visit Pre-Appointment Phone Call  "(Name), I am calling you today to discuss your upcoming appointment. We are currently trying to limit exposure to the virus that causes COVID-19 by seeing patients at home rather than in the office."  1. "What is the BEST phone number to call the day of the visit?" - include this in appointment notes  2. "Do you have or have access to (through a family member/friend) a smartphone with video capability that we can use for your visit?" a. If yes - list this number in appt notes as "cell" (if different from BEST phone #) and list the appointment type as a VIDEO visit in appointment notes b. If no - list the appointment type as a PHONE visit in appointment notes  3. Confirm consent - "In the setting of the current Covid19 crisis, you are scheduled for a (phone or video) visit with your provider on (date) at (time).  Just as we do with many in-office visits, in order for you to participate in this visit, we must obtain consent.  If you'd like, I can send this to your mychart (if signed up) or email for you to review.  Otherwise, I can obtain your verbal consent now.  All virtual visits are billed to your insurance company just like a normal visit would be.  By agreeing to a virtual visit, we'd like you to understand that the technology does not allow for your provider to perform an examination, and thus may limit your provider's ability to fully assess your condition. If your provider identifies any concerns that need to be evaluated in person, we will make arrangements to do so.  Finally, though the technology is pretty good, we cannot assure that it will always work on either your or our end, and in the setting of a video visit, we may have to convert it to a phone-only visit.  In either situation, we cannot ensure that we have a secure connection.  Are you willing to proceed?" STAFF: Did the patient verbally acknowledge consent to telehealth visit? Document  YES/NO here: yes  4. Advise patient to be prepared - "Two hours prior to your appointment, go ahead and check your blood pressure, pulse, oxygen saturation, and your weight (if you have the equipment to check those) and write them all down. When your visit starts, your provider will ask you for this information. If you have an Apple Watch or Kardia device, please plan to have heart rate information ready on the day of your appointment. Please have a pen and paper handy nearby the day of the visit as well."  5. Give patient instructions for MyChart download to smartphone OR Doximity/Doxy.me as below if video visit (depending on what platform provider is using)  6. Inform patient they will receive a phone call 15 minutes prior to their appointment time (may be from unknown caller ID) so they should be prepared to answer    TELEPHONE CALL NOTE  Daniel Grant has been deemed a candidate for a follow-up tele-health visit to limit community exposure during the Covid-19 pandemic. I spoke with the patient via phone to ensure availability of phone/video source, confirm preferred email & phone number, and discuss instructions and expectations.  I reminded Daniel Grant to be prepared with any vital sign and/or heart rhythm information that could potentially be obtained via home monitoring, at the time of his visit. I reminded Daniel Grant to expect a phone call prior to  his visit.  Lucita Ferrara, CMA 05/13/2019 2:55 PM   INSTRUCTIONS FOR DOWNLOADING THE MYCHART APP TO SMARTPHONE  - The patient must first make sure to have activated MyChart and know their login information - If Apple, go to [employer] and type in MyChart in the search bar and download the app. If Android, ask patient to go to [employer] and type in [place] in the search bar and download the app. The app is free but as with any other app downloads, their phone may require them to verify saved payment information or  Apple/Android password.  - The patient will need to then log into the app with their MyChart username and password, and select [facility] as their healthcare provider to link the account. When it is time for your visit, go to the MyChart app, find appointments, and click Begin Video Visit. Be sure to Select Allow for your device to access the Microphone and Camera for your visit. You will then be connected, and your provider will be with you shortly.  **If they have any issues connecting, or need assistance please contact MyChart service desk [phone])**  **If using a computer, in order to ensure the best quality for their visit they will need to use either of the following Internet Browsers: [employer] or Google Chrome**  IF USING DOXIMITY or DOXY.ME - The patient will receive a link just prior to their visit by text.     FULL LENGTH CONSENT FOR TELE-HEALTH VISIT   I hereby voluntarily request, consent and authorize [facility] and its employed or contracted physicians, physician assistants, nurse practitioners or other licensed health care professionals (the Practitioner), to provide me with telemedicine health care services (the "Services") as deemed necessary by the treating Practitioner. I acknowledge and consent to receive the Services by the Practitioner via telemedicine. I understand that the telemedicine visit will involve communicating with the Practitioner through live audiovisual communication technology and the disclosure of certain medical information by electronic transmission. I acknowledge that I have been given the opportunity to request an in-person assessment or other available alternative prior to the telemedicine visit and am voluntarily participating in the telemedicine visit.  I understand that I have the right to withhold or withdraw my consent to the use of telemedicine in the course of my care at any time, without affecting my right to future care  or treatment, and that the Practitioner or I may terminate the telemedicine visit at any time. I understand that I have the right to inspect all information obtained and/or recorded in the course of the telemedicine visit and may receive copies of available information for a reasonable fee.  I understand that some of the potential risks of receiving the Services via telemedicine include:  Marland Kitchen Delay or interruption in medical evaluation due to technological equipment failure or disruption; . Information transmitted may not be sufficient (e.g. poor resolution of images) to allow for appropriate medical decision making by the Practitioner; and/or  . In rare instances, security protocols could fail, causing a breach of personal health information.  Furthermore, I acknowledge that it is my responsibility to provide information about my medical history, conditions and care that is complete and accurate to the best of my ability. I acknowledge that Practitioner's advice, recommendations, and/or decision may be based on factors not within their control, such as incomplete or inaccurate data provided by me or distortions of diagnostic images or specimens that may result from electronic transmissions.  I understand that the practice of medicine is not an exact science and that Practitioner makes no warranties or guarantees regarding treatment outcomes. I acknowledge that I will receive a copy of this consent concurrently upon execution via email to the email address I last provided but may also request a printed copy by calling the office of [facility].    I understand that my insurance will be billed for this visit.   I have read or had this consent read to me. . I understand the contents of this consent, which adequately explains the benefits and risks of the Services being provided via telemedicine.  . I have been provided ample opportunity to ask questions regarding this consent and the Services and have had  my questions answered to my satisfaction. . I give my informed consent for the services to be provided through the use of telemedicine in my medical care  By participating in this telemedicine visit I agree to the above.

## 2019-05-21 LAB — POCT INR: INR: 1.4 — AB (ref 2.0–3.0)

## 2019-05-23 ENCOUNTER — Ambulatory Visit (INDEPENDENT_AMBULATORY_CARE_PROVIDER_SITE_OTHER): Payer: 59 | Admitting: Internal Medicine

## 2019-05-23 DIAGNOSIS — Z5181 Encounter for therapeutic drug level monitoring: Secondary | ICD-10-CM | POA: Diagnosis not present

## 2019-05-23 DIAGNOSIS — I480 Paroxysmal atrial fibrillation: Secondary | ICD-10-CM | POA: Diagnosis not present

## 2019-06-05 LAB — POCT INR: INR: 2.3 (ref 2.0–3.0)

## 2019-06-06 ENCOUNTER — Ambulatory Visit (INDEPENDENT_AMBULATORY_CARE_PROVIDER_SITE_OTHER): Payer: 59 | Admitting: Cardiology

## 2019-06-06 DIAGNOSIS — I480 Paroxysmal atrial fibrillation: Secondary | ICD-10-CM

## 2019-06-08 LAB — HM DIABETES EYE EXAM

## 2019-06-18 LAB — POCT INR: INR: 2.6 (ref 2.0–3.0)

## 2019-06-20 ENCOUNTER — Ambulatory Visit (INDEPENDENT_AMBULATORY_CARE_PROVIDER_SITE_OTHER): Payer: 59

## 2019-06-20 DIAGNOSIS — I480 Paroxysmal atrial fibrillation: Secondary | ICD-10-CM | POA: Diagnosis not present

## 2019-06-21 ENCOUNTER — Encounter: Payer: Self-pay | Admitting: General Practice

## 2019-06-27 ENCOUNTER — Other Ambulatory Visit: Payer: Self-pay | Admitting: General Practice

## 2019-06-27 ENCOUNTER — Other Ambulatory Visit: Payer: Self-pay

## 2019-06-27 MED ORDER — WARFARIN SODIUM 10 MG PO TABS: ORAL_TABLET | ORAL | 1 refills | Status: DC

## 2019-06-27 MED ORDER — METFORMIN HCL ER 500 MG PO TB24: ORAL_TABLET | ORAL | 1 refills | Status: DC

## 2019-07-09 LAB — POCT INR: INR: 3.3 — AB (ref 2.0–3.0)

## 2019-07-11 ENCOUNTER — Ambulatory Visit (INDEPENDENT_AMBULATORY_CARE_PROVIDER_SITE_OTHER): Payer: 59 | Admitting: Cardiovascular Disease

## 2019-07-11 DIAGNOSIS — I480 Paroxysmal atrial fibrillation: Secondary | ICD-10-CM | POA: Diagnosis not present

## 2019-07-23 LAB — POCT INR: INR: 3.1 — AB (ref 2.0–3.0)

## 2019-07-25 ENCOUNTER — Ambulatory Visit (INDEPENDENT_AMBULATORY_CARE_PROVIDER_SITE_OTHER): Payer: 59

## 2019-07-25 DIAGNOSIS — I480 Paroxysmal atrial fibrillation: Secondary | ICD-10-CM | POA: Diagnosis not present

## 2019-07-30 ENCOUNTER — Ambulatory Visit: Payer: 59 | Attending: Internal Medicine

## 2019-07-30 DIAGNOSIS — Z23 Encounter for immunization: Secondary | ICD-10-CM | POA: Insufficient documentation

## 2019-07-30 NOTE — Progress Notes (Signed)
   Covid-19 Vaccination Clinic  Name:  THEODOR MUSTIN    MRN: [id number] DOB: 24-Feb-1966  07/30/2019  Mr. Douthit was observed post Covid-19 immunization for 15 minutes without incident. He was provided with Vaccine Information Sheet and instruction to access the V-Safe system.   Mr. Reth was instructed to call 911 with any severe reactions post vaccine: Marland Kitchen Difficulty breathing  . Swelling of face and throat  . A fast heartbeat  . A bad rash all over body  . Dizziness and weakness   Immunizations Administered    Name Date Dose VIS Date Route   Pfizer COVID-19 Vaccine 07/30/2019 10:03 AM 0.3 mL 05/06/2019 Intramuscular   Manufacturer: [employer], [employer]   Lot: [id number]   NDC: [address]-6

## 2019-08-03 ENCOUNTER — Ambulatory Visit: Payer: 59 | Admitting: Family Medicine

## 2019-08-04 ENCOUNTER — Other Ambulatory Visit: Payer: Self-pay

## 2019-08-04 ENCOUNTER — Ambulatory Visit (INDEPENDENT_AMBULATORY_CARE_PROVIDER_SITE_OTHER): Payer: 59 | Admitting: Family Medicine

## 2019-08-04 ENCOUNTER — Encounter: Payer: Self-pay | Admitting: Family Medicine

## 2019-08-04 VITALS — BP 104/64 | HR 84 | Ht 70.0 in | Wt 279.0 lb

## 2019-08-04 DIAGNOSIS — E785 Hyperlipidemia, unspecified: Secondary | ICD-10-CM

## 2019-08-04 DIAGNOSIS — I10 Essential (primary) hypertension: Secondary | ICD-10-CM

## 2019-08-04 DIAGNOSIS — E119 Type 2 diabetes mellitus without complications: Secondary | ICD-10-CM

## 2019-08-04 DIAGNOSIS — E1169 Type 2 diabetes mellitus with other specified complication: Secondary | ICD-10-CM | POA: Diagnosis not present

## 2019-08-04 NOTE — Progress Notes (Signed)
I have discussed the procedure for the virtual visit with the patient who has given consent to proceed with assessment and treatment.   BS last check 114  Geannie Risen, CMA

## 2019-08-04 NOTE — Progress Notes (Signed)
Virtual Visit via Video   I connected with patient on 08/04/19 at  4:00 PM EST by a video enabled telemedicine application and verified that I am speaking with the correct person using two identifiers.  Location patient: Home Location provider: [facility], Office Persons participating in the virtual visit: Patient, Provider, CMA (Jess B)  I discussed the limitations of evaluation and management by telemedicine and the availability of in person appointments. The patient expressed understanding and agreed to proceed.  Subjective:   HPI:   HTN- chronic problem, on Norvasc 5mg  daily, Benazepril 20mg  daily, HCTZ 12.5mg  daily, Metoprolol 150mg  daily w/ good control.  No CP, SOB, HAs, visual changes, edema.  Hyperlipidemia- chronic problem, on Pravastatin 40mg  daily.  No abd pain, N/V.  DM- chronic problem, on Metformin XR 1000mg  daily.  On ACE forr renal protection, UTD on eye exam.  Due for foot exam.  No numbness/tingling of hands/feet.  No symptomatic lows.  No regular exercise.    Obesity- pt's BMI 40.03.  ROS:   See pertinent positives and negatives per HPI.  Patient Active Problem List   Diagnosis Date Noted  . Chronic anticoagulation 05/13/2019  . normal coronary angiography 2001 05/13/2019  . OSA (obstructive sleep apnea) 06/16/2016  . Hypertriglyceridemia, essential 06/06/2015  . Encounter for therapeutic drug monitoring 07/26/2013  . Erectile dysfunction 12/10/2011  . Routine general medical examination at a health care facility 09/08/2011  . Diabetes mellitus type 2, controlled (HCC) 08/29/2008  . Hyperlipidemia with target LDL less than 100 08/29/2008  . Morbid obesity (HCC) 08/29/2008  . Essential hypertension 08/29/2008  . PAF (paroxysmal atrial fibrillation) (HCC) 08/29/2008    Social History   Tobacco Use  . Smoking status: Former Smoker    Quit date: 05/26/1994    Years since quitting: 25.2  . Smokeless tobacco: Never Used  . Tobacco comment:  quit in 1995  Substance Use Topics  . Alcohol use: Yes    Alcohol/week: 5.0 standard drinks    Types: 5 Cans of beer per week    Current Outpatient Medications:  .  amLODipine (NORVASC) 5 MG tablet, Take 1 tablet (5 mg total) by mouth daily., Disp: 90 tablet, Rfl: 3 .  benazepril (LOTENSIN) 20 MG tablet, Take 1 tablet (20 mg total) by mouth daily., Disp: 90 tablet, Rfl: 3 .  flecainide (TAMBOCOR) 100 MG tablet, Take 1 tablet (100 mg total) by mouth 2 (two) times daily., Disp: 180 tablet, Rfl: 3 .  fluticasone (FLONASE) 50 MCG/ACT nasal spray, Place 2 sprays into both nostrils daily. (Patient taking differently: Place 2 sprays into both nostrils daily as needed. ), Disp: 16 g, Rfl: 5 .  hydrochlorothiazide (HYDRODIURIL) 12.5 MG tablet, Take 1 tablet (12.5 mg total) by mouth daily., Disp: 90 tablet, Rfl: 3 .  metFORMIN (GLUCOPHAGE-XR) 500 MG 24 hr tablet, Take 2 tablets by mouth before breakfast and 1 tablet with supper daily., Disp: 270 tablet, Rfl: 1 .  metoprolol succinate (TOPROL-XL) 50 MG 24 hr tablet, TAKE 3 TABLETS BY MOUTH  DAILY WITH OR IMMEDIATELY  FOLLOWING A MEAL, Disp: 270 tablet, Rfl: 1 .  Multiple Vitamin (MULTIVITAMIN) tablet, Take 1 tablet by mouth daily.  , Disp: , Rfl:  .  Omega-3 Fatty Acids (FISH OIL) 1200 MG CAPS, Take 2 capsules by mouth 2 (two) times daily., Disp: , Rfl:  .  pravastatin (PRAVACHOL) 40 MG tablet, Take 1 tablet (40 mg total) by mouth daily., Disp: 90 tablet, Rfl: 3 .  sildenafil (VIAGRA) 100  MG tablet, TAKE 1 TABLET BY MOUTH  DAILY AS NEEDED FOR  ERECTILE DYSFUNCTION, Disp: 9 tablet, Rfl: 1 .  warfarin (COUMADIN) 10 MG tablet, Take 1/2 to 1 tablet daily as directed by coumadin clinic, Disp: 45 tablet, Rfl: 1  No Known Allergies  Objective:   BP 104/64   Pulse 84   Ht 5\' 10"  (1.778 m)   Wt 279 lb (126.6 kg)   BMI 40.03 kg/m   AAOx3, NAD NCAT, EOMI No obvious CN deficits Coloring WNL Pt is able to speak clearly, coherently without shortness of  breath or increased work of breathing.  Thought process is linear.  Mood is appropriate.   Assessment and Plan:   HTN- chronic problem, well controlled.  Asymptomatic.  Check labs.  No anticipated med changes.  Will follow.  Hyperlipidemia- chronic problem.  Tolerating statin w/o difficulty.  Stressed need for healthy diet and regular exercise.  Check labs.  Adjust meds prn   DM- chronic problem.  UTD on eye exam, on ACE for renal protection, due for foot exam.  Again discussed need for healthy diet and regular exercise.  Check labs.  Adjust meds prn   Obesity- ongoing issue for pt.  Encouraged diet and exercise.  Will follow.   Annye Asa, MD 08/04/2019

## 2019-08-07 LAB — POCT INR: INR: 2.4 (ref 2.0–3.0)

## 2019-08-08 ENCOUNTER — Ambulatory Visit (INDEPENDENT_AMBULATORY_CARE_PROVIDER_SITE_OTHER): Payer: 59 | Admitting: Cardiovascular Disease

## 2019-08-08 DIAGNOSIS — I480 Paroxysmal atrial fibrillation: Secondary | ICD-10-CM | POA: Diagnosis not present

## 2019-08-08 DIAGNOSIS — Z7901 Long term (current) use of anticoagulants: Secondary | ICD-10-CM | POA: Diagnosis not present

## 2019-08-20 ENCOUNTER — Ambulatory Visit: Payer: 59 | Attending: Internal Medicine

## 2019-08-20 DIAGNOSIS — Z23 Encounter for immunization: Secondary | ICD-10-CM

## 2019-08-20 NOTE — Progress Notes (Signed)
   Covid-19 Vaccination Clinic  Name:  WAYMAN HOARD    MRN: [id number] DOB: [birth year]  08/20/2019  Mr. Blatz was observed post Covid-19 immunization for 30 minutes based on pre-vaccination screening without incident. He was provided with Vaccine Information Sheet and instruction to access the V-Safe system.   Mr. Matheson was instructed to call 911 with any severe reactions post vaccine: Marland Kitchen Difficulty breathing  . Swelling of face and throat  . A fast heartbeat  . A bad rash all over body  . Dizziness and weakness   Immunizations Administered    Name Date Dose VIS Date Route   Pfizer COVID-19 Vaccine 08/20/2019  9:17 AM 0.3 mL 05/06/2019 Intramuscular   Manufacturer: [employer], [employer]   Lot: [id number]   NDC: [address]-5

## 2019-08-21 LAB — POCT INR: INR: 2.9 (ref 2.0–3.0)

## 2019-08-22 ENCOUNTER — Other Ambulatory Visit (INDEPENDENT_AMBULATORY_CARE_PROVIDER_SITE_OTHER): Payer: 59

## 2019-08-22 ENCOUNTER — Ambulatory Visit (INDEPENDENT_AMBULATORY_CARE_PROVIDER_SITE_OTHER): Payer: 59 | Admitting: Internal Medicine

## 2019-08-22 DIAGNOSIS — E119 Type 2 diabetes mellitus without complications: Secondary | ICD-10-CM | POA: Diagnosis not present

## 2019-08-22 DIAGNOSIS — I10 Essential (primary) hypertension: Secondary | ICD-10-CM | POA: Diagnosis not present

## 2019-08-22 DIAGNOSIS — E1169 Type 2 diabetes mellitus with other specified complication: Secondary | ICD-10-CM | POA: Diagnosis not present

## 2019-08-22 DIAGNOSIS — E785 Hyperlipidemia, unspecified: Secondary | ICD-10-CM | POA: Diagnosis not present

## 2019-08-22 DIAGNOSIS — I480 Paroxysmal atrial fibrillation: Secondary | ICD-10-CM | POA: Diagnosis not present

## 2019-08-22 LAB — HEPATIC FUNCTION PANEL
ALT: 20 U/L (ref 0–53)
AST: 13 U/L (ref 0–37)
Albumin: 4 g/dL (ref 3.5–5.2)
Alkaline Phosphatase: 86 U/L (ref 39–117)
Bilirubin, Direct: 0.2 mg/dL (ref 0.0–0.3)
Total Bilirubin: 0.7 mg/dL (ref 0.2–1.2)
Total Protein: 6.6 g/dL (ref 6.0–8.3)

## 2019-08-22 LAB — LIPID PANEL
Cholesterol: 109 mg/dL (ref 0–200)
HDL: 31.9 mg/dL — ABNORMAL LOW (ref >39.00)
LDL Cholesterol: 53 mg/dL (ref 0–99)
NonHDL: 77.47
Total CHOL/HDL Ratio: 3
Triglycerides: 124 mg/dL (ref 0.0–149.0)
VLDL: 24.8 mg/dL (ref 0.0–40.0)

## 2019-08-22 LAB — CBC WITH DIFFERENTIAL/PLATELET
Basophils Absolute: 0.1 10*3/uL (ref 0.0–0.1)
Basophils Relative: 0.7 % (ref 0.0–3.0)
Eosinophils Absolute: 0.2 10*3/uL (ref 0.0–0.7)
Eosinophils Relative: 3.1 % (ref 0.0–5.0)
HCT: 42 % (ref 39.0–52.0)
Hemoglobin: 14.4 g/dL (ref 13.0–17.0)
Lymphocytes Relative: 29.5 % (ref 12.0–46.0)
Lymphs Abs: 2.2 10*3/uL (ref 0.7–4.0)
MCHC: 34.3 g/dL (ref 30.0–36.0)
MCV: 84.4 fl (ref 78.0–100.0)
Monocytes Absolute: 0.9 10*3/uL (ref 0.1–1.0)
Monocytes Relative: 12.5 % — ABNORMAL HIGH (ref 3.0–12.0)
Neutro Abs: 4.1 10*3/uL (ref 1.4–7.7)
Neutrophils Relative %: 54.2 % (ref 43.0–77.0)
Platelets: 178 10*3/uL (ref 150.0–400.0)
RBC: 4.98 Mil/uL (ref 4.22–5.81)
RDW: 13.8 % (ref 11.5–15.5)
WBC: 7.6 10*3/uL (ref 4.0–10.5)

## 2019-08-22 LAB — HEMOGLOBIN A1C: Hgb A1c MFr Bld: 6.8 % — ABNORMAL HIGH (ref 4.6–6.5)

## 2019-08-22 LAB — BASIC METABOLIC PANEL
BUN: 19 mg/dL (ref 6–23)
CO2: 25 mEq/L (ref 19–32)
Calcium: 9.3 mg/dL (ref 8.4–10.5)
Chloride: 105 mEq/L (ref 96–112)
Creatinine, Ser: 0.94 mg/dL (ref 0.40–1.50)
GFR: 100.45 mL/min (ref >60.00)
Glucose, Bld: 175 mg/dL — ABNORMAL HIGH (ref 70–99)
Potassium: 4.2 mEq/L (ref 3.5–5.1)
Sodium: 138 mEq/L (ref 135–145)

## 2019-08-22 LAB — TSH: TSH: 0.53 u[IU]/mL (ref 0.35–4.50)

## 2019-09-10 LAB — POCT INR: INR: 3 (ref 2.0–3.0)

## 2019-09-12 ENCOUNTER — Ambulatory Visit (INDEPENDENT_AMBULATORY_CARE_PROVIDER_SITE_OTHER): Payer: 59 | Admitting: Cardiovascular Disease

## 2019-09-12 DIAGNOSIS — I480 Paroxysmal atrial fibrillation: Secondary | ICD-10-CM | POA: Diagnosis not present

## 2019-10-01 LAB — POCT INR: INR: 2.3 (ref 2.0–3.0)

## 2019-10-03 ENCOUNTER — Other Ambulatory Visit: Payer: Self-pay | Admitting: Family Medicine

## 2019-10-03 ENCOUNTER — Other Ambulatory Visit: Payer: Self-pay | Admitting: Cardiology

## 2019-10-03 ENCOUNTER — Ambulatory Visit (INDEPENDENT_AMBULATORY_CARE_PROVIDER_SITE_OTHER): Payer: 59 | Admitting: Cardiovascular Disease

## 2019-10-03 DIAGNOSIS — I480 Paroxysmal atrial fibrillation: Secondary | ICD-10-CM | POA: Diagnosis not present

## 2019-10-03 DIAGNOSIS — N522 Drug-induced erectile dysfunction: Secondary | ICD-10-CM

## 2019-10-29 LAB — POCT INR: INR: 1.4 — AB (ref 2.0–3.0)

## 2019-10-31 ENCOUNTER — Telehealth: Payer: Self-pay | Admitting: Cardiology

## 2019-10-31 ENCOUNTER — Ambulatory Visit (INDEPENDENT_AMBULATORY_CARE_PROVIDER_SITE_OTHER): Payer: 59 | Admitting: Pharmacist

## 2019-10-31 DIAGNOSIS — I480 Paroxysmal atrial fibrillation: Secondary | ICD-10-CM | POA: Diagnosis not present

## 2019-10-31 DIAGNOSIS — Z7901 Long term (current) use of anticoagulants: Secondary | ICD-10-CM

## 2019-10-31 NOTE — Telephone Encounter (Signed)
Will route to Coumadin Clinic.   Thanks!

## 2019-10-31 NOTE — Telephone Encounter (Signed)
New message   Patient's wife states that the patient's INR is 1.4. Please call to discuss.

## 2019-10-31 NOTE — Telephone Encounter (Signed)
Duplicate. Please see anti-coagulation note.

## 2019-11-13 LAB — POCT INR: INR: 2.3 (ref 2.0–3.0)

## 2019-11-14 ENCOUNTER — Ambulatory Visit (INDEPENDENT_AMBULATORY_CARE_PROVIDER_SITE_OTHER): Payer: 59 | Admitting: Cardiovascular Disease

## 2019-11-14 DIAGNOSIS — I480 Paroxysmal atrial fibrillation: Secondary | ICD-10-CM | POA: Diagnosis not present

## 2019-11-26 ENCOUNTER — Encounter: Payer: Self-pay | Admitting: Cardiology

## 2019-11-26 LAB — PROTIME-INR: INR: 1.8 — AB (ref 0.9–1.1)

## 2019-11-29 ENCOUNTER — Ambulatory Visit (INDEPENDENT_AMBULATORY_CARE_PROVIDER_SITE_OTHER): Payer: 59 | Admitting: Pharmacist Clinician (PhC)/ Clinical Pharmacy Specialist

## 2019-11-29 DIAGNOSIS — Z7901 Long term (current) use of anticoagulants: Secondary | ICD-10-CM | POA: Diagnosis not present

## 2019-11-29 DIAGNOSIS — I480 Paroxysmal atrial fibrillation: Secondary | ICD-10-CM | POA: Diagnosis not present

## 2019-11-29 LAB — POCT INR: INR: 1.8 — AB (ref 2.0–3.0)

## 2019-12-01 NOTE — Progress Notes (Addendum)
Virtual Visit via Video Note   This visit type was conducted due to national recommendations for restrictions regarding the COVID-19 Pandemic (e.g. social distancing) in an effort to limit this patient's exposure and mitigate transmission in our community.  Due to his co-morbid illnesses, this patient is at least at moderate risk for complications without adequate follow up.  This format is felt to be most appropriate for this patient at this time.  All issues noted in this document were discussed and addressed.  A limited physical exam was performed with this format.  Please refer to the patient's chart for his consent to telehealth for [facility].    Patient location-Home My location office  HPI: FU atrial fibrillation. Cardiac catheterization in January of 2001 showed normal LV function and normal coronary arteries. Nuclear study in June of 2011 showed soft tissue attenuation and no ischemia. Patient has been treated with flecainide for atrial fibrillation. He did have an exercise treadmill in September of 2011thatshowed no exercise induced ventricular tachycardia. Last echocardiogram February 2018 showed normal LV systolic function, moderate diastolic dysfunction, mild right ventricular enlargement. Patient was scheduled for cardioversion2/18but upon arrival was found to be in sinus rhythm. Since last seen,the patient denies any dyspnea on exertion, orthopnea, PND, pedal edema, palpitations, syncope or chest pain.   Current Outpatient Medications  Medication Sig Dispense Refill  . amLODipine (NORVASC) 5 MG tablet Take 1 tablet (5 mg total) by mouth daily. 90 tablet 3  . benazepril (LOTENSIN) 20 MG tablet Take 1 tablet (20 mg total) by mouth daily. 90 tablet 3  . flecainide (TAMBOCOR) 100 MG tablet Take 1 tablet (100 mg total) by mouth 2 (two) times daily. 180 tablet 3  . fluticasone (FLONASE) 50 MCG/ACT nasal spray Place 2 sprays into both nostrils daily. (Patient taking  differently: Place 2 sprays into both nostrils daily as needed. ) 16 g 5  . hydrochlorothiazide (HYDRODIURIL) 12.5 MG tablet Take 1 tablet (12.5 mg total) by mouth daily. 90 tablet 3  . metFORMIN (GLUCOPHAGE-XR) 500 MG 24 hr tablet Take 2 tablets by mouth before breakfast and 1 tablet with supper daily. 270 tablet 1  . metoprolol succinate (TOPROL-XL) 50 MG 24 hr tablet TAKE 3 TABLETS BY MOUTH  DAILY WITH OR IMMEDIATELY  FOLLOWING A MEAL 270 tablet 1  . Multiple Vitamin (MULTIVITAMIN) tablet Take 1 tablet by mouth daily.      . Omega-3 Fatty Acids (FISH OIL) 1200 MG CAPS Take 2 capsules by mouth 2 (two) times daily.    . pravastatin (PRAVACHOL) 40 MG tablet Take 1 tablet (40 mg total) by mouth daily. 90 tablet 3  . sildenafil (VIAGRA) 100 MG tablet TAKE 1 TABLET BY MOUTH  DAILY AS NEEDED FOR  ERECTILE DYSFUNCTION 9 tablet 1  . warfarin (COUMADIN) 10 MG tablet TAKE 1/2 TO 1 TABLET BY  MOUTH DAILY AS DIRECTED BY  COUMADIN CLINIC 90 tablet 0   No current facility-administered medications for this visit.     Past Medical History:  Diagnosis Date  . DM II (diabetes mellitus, type II), controlled (HCC)   . Erectile dysfunction   . Hyperlipidemia   . Hypertension   . Obesity   . OSA (obstructive sleep apnea)   . Paroxysmal atrial fibrillation [facility])     Past Surgical History:  Procedure Laterality Date  . CARDIAC CATHETERIZATION    . FINGER SURGERY     left 1st finger    Social History   Socioeconomic History  . Marital  status: Married    Spouse name: Not on file  . Number of children: Not on file  . Years of education: Not on file  . Highest education level: Not on file  Occupational History  . Not on file  Tobacco Use  . Smoking status: Former Smoker    Quit date: 05/26/1994    Years since quitting: 25.5  . Smokeless tobacco: Never Used  . Tobacco comment: quit in 1995  Vaping Use  . Vaping Use: Never used  Substance and Sexual Activity  . Alcohol use: Yes    Alcohol/week:  5.0 standard drinks    Types: 5 Cans of beer per week  . Drug use: No  . Sexual activity: Yes  Other Topics Concern  . Not on file  Social History Narrative  . Not on file   Social Determinants of Health   Financial Resource Strain:   . Difficulty of Paying Living Expenses:   Food Insecurity:   . Worried About Programme researcher, broadcasting/film/video in the Last Year:   . Barista in the Last Year:   Transportation Needs:   . Freight forwarder (Medical):   Marland Kitchen Lack of Transportation (Non-Medical):   Physical Activity:   . Days of Exercise per Week:   . Minutes of Exercise per Session:   Stress:   . Feeling of Stress :   Social Connections:   . Frequency of Communication with Friends and Family:   . Frequency of Social Gatherings with Friends and Family:   . Attends Religious Services:   . Active Member of Clubs or Organizations:   . Attends Banker Meetings:   Marland Kitchen Marital Status:   Intimate Partner Violence:   . Fear of Current or Ex-Partner:   . Emotionally Abused:   Marland Kitchen Physically Abused:   . Sexually Abused:     Family History  Problem Relation Age of Onset  . Heart attack Paternal Uncle   . Heart attack Maternal Grandmother   . Heart disease Mother   . Hypertension Mother   . Diabetes Mother   . Alcohol abuse Father   . Cancer Neg Hx   . Early death Neg Hx   . Hyperlipidemia Neg Hx   . Kidney disease Neg Hx   . Stroke Neg Hx     ROS: no fevers or chills, productive cough, hemoptysis, dysphasia, odynophagia, melena, hematochezia, dysuria, hematuria, rash, seizure activity, orthopnea, PND, pedal edema, claudication. Remaining systems are negative.  Physical Exam: Answers questions appropriately Normal affect No acute distress Remainder physical examination not performed due to telehealth visit.  A/P  1 paroxysmal atrial fibrillation-patient is in sinus rhythm today by history.  Continue beta-blocker at present dose.  Continue flecainide.  We will also  continue Coumadin with goal INR 2-3.  We discussed apixaban and he will check on cost.  If affordable we will transition.  2 hypertension-blood pressure is controlled.  Continue present medications and follow.  3 hyperlipidemia-continue statin.  COVID-19 Education: The importance of social distancing was discussed today.  Time:   Today, I have spent 16 minutes with the patient with telehealth technology discussing the above problems.     Medication Adjustments/Labs and Tests Ordered: Current medicines are reviewed at length with the patient today.  Concerns regarding medicines are outlined above.   Tests Ordered: No orders of the defined types were placed in this encounter.   Medication Changes: No orders of the defined types were placed in this encounter.  Follow Up:  Either In Person or Virtual in 1 year(s)  Signed, Olga Millers, MD  11/22/2019 5:46 PM    [facility]

## 2019-12-02 ENCOUNTER — Telehealth (INDEPENDENT_AMBULATORY_CARE_PROVIDER_SITE_OTHER): Payer: 59 | Admitting: Cardiology

## 2019-12-02 ENCOUNTER — Encounter: Payer: Self-pay | Admitting: Cardiology

## 2019-12-02 VITALS — BP 126/88 | HR 76 | Ht 71.0 in | Wt 260.0 lb

## 2019-12-02 DIAGNOSIS — E785 Hyperlipidemia, unspecified: Secondary | ICD-10-CM | POA: Diagnosis not present

## 2019-12-02 DIAGNOSIS — I480 Paroxysmal atrial fibrillation: Secondary | ICD-10-CM

## 2019-12-02 DIAGNOSIS — I10 Essential (primary) hypertension: Secondary | ICD-10-CM

## 2019-12-02 NOTE — Patient Instructions (Signed)

## 2019-12-11 LAB — POCT INR: INR: 2.4 (ref 2.0–3.0)

## 2019-12-12 ENCOUNTER — Ambulatory Visit (INDEPENDENT_AMBULATORY_CARE_PROVIDER_SITE_OTHER): Payer: 59 | Admitting: Internal Medicine

## 2019-12-12 DIAGNOSIS — I480 Paroxysmal atrial fibrillation: Secondary | ICD-10-CM | POA: Diagnosis not present

## 2020-01-03 ENCOUNTER — Other Ambulatory Visit: Payer: Self-pay | Admitting: Cardiology

## 2020-01-03 ENCOUNTER — Other Ambulatory Visit: Payer: Self-pay | Admitting: Family Medicine

## 2020-01-04 MED ORDER — WARFARIN SODIUM 10 MG PO TABS: ORAL_TABLET | ORAL | 0 refills | Status: DC

## 2020-01-04 MED ORDER — METOPROLOL SUCCINATE ER 50 MG PO TB24: ORAL_TABLET | ORAL | 3 refills | Status: DC

## 2020-01-08 LAB — POCT INR: INR: 2.3 (ref 2.0–3.0)

## 2020-01-09 ENCOUNTER — Ambulatory Visit (INDEPENDENT_AMBULATORY_CARE_PROVIDER_SITE_OTHER): Payer: 59 | Admitting: Internal Medicine

## 2020-01-09 DIAGNOSIS — I480 Paroxysmal atrial fibrillation: Secondary | ICD-10-CM

## 2020-02-05 LAB — POCT INR: INR: 2.5 (ref 2.0–3.0)

## 2020-02-07 ENCOUNTER — Ambulatory Visit (INDEPENDENT_AMBULATORY_CARE_PROVIDER_SITE_OTHER): Payer: 59 | Admitting: Internal Medicine

## 2020-02-07 DIAGNOSIS — I480 Paroxysmal atrial fibrillation: Secondary | ICD-10-CM

## 2020-02-07 NOTE — Patient Instructions (Signed)
Self tester Continue with 1/2 tablet (5mg ) every day except 10mg  every Monday and Friday. Follow up in 6 weeks.

## 2020-03-17 LAB — POCT INR: INR: 2.9 (ref 2.0–3.0)

## 2020-03-19 ENCOUNTER — Ambulatory Visit (INDEPENDENT_AMBULATORY_CARE_PROVIDER_SITE_OTHER): Payer: 59 | Admitting: Cardiology

## 2020-03-19 DIAGNOSIS — I480 Paroxysmal atrial fibrillation: Secondary | ICD-10-CM

## 2020-04-08 ENCOUNTER — Other Ambulatory Visit: Payer: Self-pay | Admitting: Cardiology

## 2020-04-08 ENCOUNTER — Other Ambulatory Visit: Payer: Self-pay | Admitting: Family Medicine

## 2020-04-08 DIAGNOSIS — N522 Drug-induced erectile dysfunction: Secondary | ICD-10-CM

## 2020-04-09 NOTE — Telephone Encounter (Signed)
Rx has been sent to the pharmacy electronically. ° °

## 2020-04-09 NOTE — Telephone Encounter (Signed)
Patient requesting the following  Viagra 100mg   9 tabs with 1 refill  Last refill 01/03/2020

## 2020-04-13 ENCOUNTER — Ambulatory Visit: Payer: 59 | Attending: Internal Medicine

## 2020-04-13 DIAGNOSIS — Z23 Encounter for immunization: Secondary | ICD-10-CM

## 2020-04-13 NOTE — Progress Notes (Signed)
   Covid-19 Vaccination Clinic  Name:  Daniel Grant    MRN: [id number] DOB: 1965/10/13  04/13/2020  Daniel Grant was observed post Covid-19 immunization for 15 minutes without incident. He was provided with Vaccine Information Sheet and instruction to access the V-Safe system.   Daniel Grant was instructed to call 911 with any severe reactions post vaccine: Marland Kitchen Difficulty breathing  . Swelling of face and throat  . A fast heartbeat  . A bad rash all over body  . Dizziness and weakness   Immunizations Administered    Name Date Dose VIS Date Route   Pfizer COVID-19 Vaccine 04/13/2020  2:13 PM 0.3 mL 03/14/2020 Intramuscular   Manufacturer: [employer], [employer]   Lot: [id number]   NDC: [address]-8

## 2020-04-21 LAB — POCT INR: INR: 1.6 — AB (ref 2.0–3.0)

## 2020-04-23 ENCOUNTER — Ambulatory Visit (INDEPENDENT_AMBULATORY_CARE_PROVIDER_SITE_OTHER): Payer: 59 | Admitting: Cardiology

## 2020-04-23 DIAGNOSIS — I480 Paroxysmal atrial fibrillation: Secondary | ICD-10-CM

## 2020-05-06 LAB — POCT INR: INR: 2.3 (ref 2.0–3.0)

## 2020-05-07 ENCOUNTER — Ambulatory Visit (INDEPENDENT_AMBULATORY_CARE_PROVIDER_SITE_OTHER): Payer: 59 | Admitting: Cardiology

## 2020-05-07 DIAGNOSIS — Z5181 Encounter for therapeutic drug level monitoring: Secondary | ICD-10-CM | POA: Diagnosis not present

## 2020-05-07 DIAGNOSIS — I480 Paroxysmal atrial fibrillation: Secondary | ICD-10-CM | POA: Diagnosis not present

## 2020-05-20 LAB — POCT INR: INR: 2 (ref 2.0–3.0)

## 2020-05-21 ENCOUNTER — Ambulatory Visit (INDEPENDENT_AMBULATORY_CARE_PROVIDER_SITE_OTHER): Payer: 59 | Admitting: Cardiovascular Disease

## 2020-05-21 DIAGNOSIS — I480 Paroxysmal atrial fibrillation: Secondary | ICD-10-CM | POA: Diagnosis not present

## 2020-06-13 ENCOUNTER — Telehealth: Payer: Self-pay

## 2020-06-13 NOTE — Telephone Encounter (Signed)
lmom for overdue inr 

## 2020-06-17 LAB — POCT INR: INR: 2.5 (ref 2.0–3.0)

## 2020-06-18 ENCOUNTER — Ambulatory Visit (INDEPENDENT_AMBULATORY_CARE_PROVIDER_SITE_OTHER): Payer: 59 | Admitting: Cardiology

## 2020-06-18 DIAGNOSIS — Z5181 Encounter for therapeutic drug level monitoring: Secondary | ICD-10-CM | POA: Diagnosis not present

## 2020-06-18 DIAGNOSIS — I480 Paroxysmal atrial fibrillation: Secondary | ICD-10-CM | POA: Diagnosis not present

## 2020-06-19 ENCOUNTER — Telehealth (INDEPENDENT_AMBULATORY_CARE_PROVIDER_SITE_OTHER): Payer: 59 | Admitting: Cardiology

## 2020-06-19 ENCOUNTER — Encounter: Payer: Self-pay | Admitting: Cardiology

## 2020-06-19 VITALS — BP 112/79 | HR 84 | Ht 71.0 in | Wt 270.0 lb

## 2020-06-19 DIAGNOSIS — R079 Chest pain, unspecified: Secondary | ICD-10-CM

## 2020-06-19 DIAGNOSIS — E1169 Type 2 diabetes mellitus with other specified complication: Secondary | ICD-10-CM | POA: Diagnosis not present

## 2020-06-19 DIAGNOSIS — I480 Paroxysmal atrial fibrillation: Secondary | ICD-10-CM

## 2020-06-19 DIAGNOSIS — I10 Essential (primary) hypertension: Secondary | ICD-10-CM

## 2020-06-19 DIAGNOSIS — G4733 Obstructive sleep apnea (adult) (pediatric): Secondary | ICD-10-CM

## 2020-06-19 DIAGNOSIS — E119 Type 2 diabetes mellitus without complications: Secondary | ICD-10-CM

## 2020-06-19 DIAGNOSIS — E785 Hyperlipidemia, unspecified: Secondary | ICD-10-CM

## 2020-06-19 NOTE — Patient Instructions (Signed)

## 2020-06-19 NOTE — Progress Notes (Signed)
Virtual Visit via Video Note   This visit type was conducted due to national recommendations for restrictions regarding the COVID-19 Pandemic (e.g. social distancing) in an effort to limit this patient's exposure and mitigate transmission in our community.  Due to his co-morbid illnesses, this patient is at least at moderate risk for complications without adequate follow up.  This format is felt to be most appropriate for this patient at this time.  All issues noted in this document were discussed and addressed.  A limited physical exam was performed with this format.  Please refer to the patient's chart for his consent to telehealth for [facility].       Date:  06/19/2020   ID:  Daniel Grant, DOB 1966-02-05, MRN [id number] The patient was identified using 2 identifiers.  Patient Location: Home Provider Location: Office/Clinic  PCP:  Sheliah Hatch, MD  Cardiologist:  Dr Jens Som Electrophysiologist:  None   Evaluation Performed:  Follow-Up Visit  Chief Complaint:  none  History of Present Illness:    Daniel Grant is a pleasant 55 y.o. male with a history of normal coronaries in 2001, low risk Myoview in 2011, and normal LV function by echocardiogram in 2018.  He has a history of PAF and is on flecainide.  He has been holding sinus rhythm.  He is on Coumadin and follows his INR at home.  He prefers to stay on Coumadin as opposed to an NOAC. He saw Dr Jens Som in July 2021. He drives a front end loader truck.  He has been doing well since we saw him last.  He is not had any problems with atrial fibrillation and denies any chest pain. He has no issues with his medications.  He has sleep apnea and is compliant with C-pap which is required for his DOT physical.   The patient does not have symptoms concerning for COVID-19 infection (fever, chills, cough, or new shortness of breath).    Past Medical History:  Diagnosis Date  . DM II (diabetes mellitus, type II), controlled  (HCC)   . Erectile dysfunction   . Hyperlipidemia   . Hypertension   . Obesity   . OSA (obstructive sleep apnea)   . Paroxysmal atrial fibrillation [facility])    Past Surgical History:  Procedure Laterality Date  . CARDIAC CATHETERIZATION    . FINGER SURGERY     left 1st finger     Current Meds  Medication Sig  . amLODipine (NORVASC) 5 MG tablet Take 1 tablet (5 mg total) by mouth daily.  . benazepril (LOTENSIN) 20 MG tablet TAKE 1 TABLET BY MOUTH  DAILY  . flecainide (TAMBOCOR) 100 MG tablet TAKE 1 TABLET BY MOUTH  TWICE DAILY  . fluticasone (FLONASE) 50 MCG/ACT nasal spray Place 2 sprays into both nostrils daily. (Patient taking differently: Place 2 sprays into both nostrils daily as needed.)  . hydrochlorothiazide (HYDRODIURIL) 12.5 MG tablet Take 1 tablet (12.5 mg total) by mouth daily.  . metFORMIN (GLUCOPHAGE-XR) 500 MG 24 hr tablet TAKE 2 TABLETS BY MOUTH  BEFORE BREAKFAST AND 1  TABLET WITH SUPPER DAILY  . metoprolol succinate (TOPROL-XL) 50 MG 24 hr tablet TAKE 3 TABLETS BY MOUTH  DAILY WITH OR IMMEDIATELY  FOLLOWING A MEAL  . Multiple Vitamin (MULTIVITAMIN) tablet Take 1 tablet by mouth daily.  . Omega-3 Fatty Acids (FISH OIL) 1200 MG CAPS Take 2 capsules by mouth 2 (two) times daily.  . pravastatin (PRAVACHOL) 40 MG tablet TAKE 1 TABLET BY  MOUTH  DAILY  . sildenafil (VIAGRA) 100 MG tablet TAKE 1 TABLET BY MOUTH  DAILY AS NEEDED FOR  ERECTILE DYSFUNCTION  . warfarin (COUMADIN) 10 MG tablet TAKE 1/2 TO 1 TABLET BY  MOUTH DAILY AS DIRECTED BY  COUMADIN CLINIC     Allergies:   Patient has no known allergies.   Social History   Tobacco Use  . Smoking status: Former Smoker    Quit date: 05/26/1994    Years since quitting: 26.0  . Smokeless tobacco: Never Used  . Tobacco comment: quit in 1995  Vaping Use  . Vaping Use: Never used  Substance Use Topics  . Alcohol use: Yes    Alcohol/week: 5.0 standard drinks    Types: 5 Cans of beer per week  . Drug use: No     Family  Hx: The patient's family history includes Alcohol abuse in his father; Diabetes in his mother; Heart attack in his maternal grandmother and paternal uncle; Heart disease in his mother; Hypertension in his mother. There is no history of Cancer, Early death, Hyperlipidemia, Kidney disease, or Stroke.  ROS:   Please see the history of present illness.    All other systems reviewed and are negative.   Prior CV studies:   The following studies were reviewed today:  Echo 07/22/2016- Study Conclusions   - Left ventricle: The cavity size was normal. There was  mild-moderate concentric hypertrophy. Systolic function was  normal. The estimated ejection fraction was in the range of 55%  to 60%. Wall motion was normal; there were no regional wall  motion abnormalities. Features are consistent with a pseudonormal  left ventricular filling pattern, with concomitant abnormal  relaxation and increased filling pressure (grade 2 diastolic  dysfunction).  - Aortic valve: Transvalvular velocity was within the normal range.  There was no stenosis. There was no regurgitation.  - Mitral valve: Transvalvular velocity was within the normal range.  There was no evidence for stenosis. There was no regurgitation.  - Right ventricle: The cavity size was mildly dilated. Wall  thickness was normal. Systolic function was normal.  - Atrial septum: No defect or patent foramen ovale was identified  by color flow Doppler.  - Tricuspid valve: There was physiologic regurgitation.  - Pulmonary arteries: Systolic pressure was within the normal  range. PA peak pressure: 32 mm Hg (S).   Labs/Other Tests and Data Reviewed:    EKG:  An ECG dated 10/22/2017 was personally reviewed today and demonstrated:  NSR, HR 68, QTc 438  Recent Labs: 08/22/2019: ALT 20; BUN 19; Creatinine, Ser 0.94; Hemoglobin 14.4; Platelets 178.0; Potassium 4.2; Sodium 138; TSH 0.53   Recent Lipid Panel Lab Results   Component Value Date/Time   CHOL 109 08/22/2019 11:10 AM   TRIG 124.0 08/22/2019 11:10 AM   HDL 31.90 (L) 08/22/2019 11:10 AM   CHOLHDL 3 08/22/2019 11:10 AM   LDLCALC 53 08/22/2019 11:10 AM   LDLDIRECT 80.0 12/15/2018 01:59 PM    Wt Readings from Last 3 Encounters:  06/19/20 270 lb (122.5 kg)  12/02/19 260 lb (117.9 kg)  08/04/19 279 lb (126.6 kg)     Risk Assessment/Calculations:    CHA2DS2-VASc Score =   1  The patient's score is based upon: HTN       Objective:    Vital Signs:  BP 112/79   Pulse 84   Ht 5\' 11"  (1.803 m)   Wt 270 lb (122.5 kg)   BMI 37.66 kg/m    VITAL  SIGNS:  reviewed  ASSESSMENT & PLAN:     PAF (paroxysmal atrial fibrillation) (HCC) Holding NSR on Flecainide. He should have an EKG at his next OV.  Chronic anticoagulation On Coumadin-CHADS VASC=2- HTN, DM He prefers to stay on Coumadin as opposed to a NOAC.  He follows his own INR at home and knows his INR goal is 2-3  Essential hypertension Controlled- normal LVF on echo 2018  Diabetes mellitus type 2, controlled On oral agents- followed by PCP  normal coronary angiography 2001 Normal cors 2001, negative Nuclear stress 2011  Hyperlipidemia with target LDL less than 100 On statin Rx, LDL 80 in July 2020       [id number] Education: The signs and symptoms of COVID-19 were discussed with the patient and how to seek care for testing (follow up with PCP or arrange E-visit).  The importance of social distancing was discussed today.  Time:   Today, I have spent 10 minutes with the patient with telehealth technology discussing the above problems.     Medication Adjustments/Labs and Tests Ordered: Current medicines are reviewed at length with the patient today.  Concerns regarding medicines are outlined above.   Tests Ordered: No orders of the defined types were placed in this encounter.   Medication Changes: No orders of the defined types were placed in this  encounter.   Follow Up:  In Person Dr Jens Som in June   Jolene Provost, [place]  06/19/2020 4:13 PM    [facility]

## 2020-07-12 ENCOUNTER — Telehealth: Payer: Self-pay | Admitting: Cardiology

## 2020-07-12 MED ORDER — FLECAINIDE ACETATE 100 MG PO TABS
100.0000 mg | ORAL_TABLET | Freq: Two times a day (BID) | ORAL | 1 refills | Status: DC
Start: 2020-07-12 — End: 2020-10-12

## 2020-07-12 NOTE — Progress Notes (Deleted)
Cardiology Clinic Note   Patient Name: Daniel Grant Date of Encounter: 07/12/2020  Primary Care Provider:  Sheliah Hatch, MD Primary Cardiologist:  No primary care provider on file.  Patient Profile    Daniel Grant 55 year old male presents the clinic today for follow-up evaluation of his paroxysmal atrial fibrillation.  Past Medical History    Past Medical History:  Diagnosis Date  . DM II (diabetes mellitus, type II), controlled (HCC)   . Erectile dysfunction   . Hyperlipidemia   . Hypertension   . Obesity   . OSA (obstructive sleep apnea)   . Paroxysmal atrial fibrillation [facility])    Past Surgical History:  Procedure Laterality Date  . CARDIAC CATHETERIZATION    . FINGER SURGERY     left 1st finger    Allergies  No Known Allergies  History of Present Illness    Daniel Grant has a PMH of essential hypertension, paroxysmal atrial fibrillation on Coumadin, obstructive sleep apnea, type 2 diabetes, hyperlipidemia,and morbid obesity. Daniel Grant underwent cardiac catheterization 2001 which showed normal coronary arteries.  Daniel Grant had a low risk nuclear stress test 2011 that showed normal LV function.  Daniel Grant had an echocardiogram 2018 that showed normal function.  Daniel Grant is on flecainide for his PAF.  Daniel Grant follows his INR at home.  Daniel Grant prefers to stay on Coumadin as opposed to other anticoagulant agents.  Daniel Grant was seen by Dr. Jens Som 7/21.  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  Daniel Grant was last seen by Corine Shelter, PA-C on 06/19/2020.  Daniel Grant was doing well at that time.  Daniel Grant denied problems with atrial fibrillation and chest pain.  Daniel Grant denied issues and side effects with this medication.  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  CHA2DS2-VASc score 2 (hypertension, diabetes)  Daniel Grant presented to his PCP today 06/19/2020 who felt Daniel Grant was in atrial fibrillation.  Daniel Grant presents to the clinic today for follow-up evaluation and states***  *** denies chest pain, shortness of breath,  lower extremity edema, fatigue, palpitations, melena, hematuria, hemoptysis, diaphoresis, weakness, presyncope, syncope, orthopnea, and PND.   Home Medications    Prior to Admission medications   Medication Sig Start Date End Date Taking? Authorizing Provider  amLODipine (NORVASC) 5 MG tablet Take 1 tablet (5 mg total) by mouth daily. 05/13/19   Abelino Derrick, PA-C  benazepril (LOTENSIN) 20 MG tablet TAKE 1 TABLET BY MOUTH  DAILY 04/09/20   Abelino Derrick, PA-C  flecainide (TAMBOCOR) 100 MG tablet Take 1 tablet (100 mg total) by mouth 2 (two) times daily. 07/12/20   Marykay Lex, MD  fluticasone [facility]) 50 MCG/ACT nasal spray Place 2 sprays into both nostrils daily. Patient taking differently: Place 2 sprays into both nostrils daily as needed. 09/24/15   Allegra Grana, FNP  hydrochlorothiazide (HYDRODIURIL) 12.5 MG tablet Take 1 tablet (12.5 mg total) by mouth daily. 05/13/19   Abelino Derrick, PA-C  metFORMIN (GLUCOPHAGE-XR) 500 MG 24 hr tablet TAKE 2 TABLETS BY MOUTH  BEFORE BREAKFAST AND 1  TABLET WITH SUPPER DAILY 01/04/20   Sheliah Hatch, MD  metoprolol succinate (TOPROL-XL) 50 MG 24 hr tablet TAKE 3 TABLETS BY MOUTH  DAILY WITH OR IMMEDIATELY  FOLLOWING A MEAL 01/04/20   Lewayne Bunting, MD  Multiple Vitamin (MULTIVITAMIN) tablet Take 1 tablet by mouth daily.    [provider]  Omega-3 Fatty Acids (FISH OIL) 1200 MG CAPS Take 2 capsules by mouth 2 (two)  times daily.    [provider]  pravastatin (PRAVACHOL) 40 MG tablet TAKE 1 TABLET BY MOUTH  DAILY 04/09/20   Corine Shelter K, PA-C  sildenafil (VIAGRA) 100 MG tablet TAKE 1 TABLET BY MOUTH  DAILY AS NEEDED FOR  ERECTILE DYSFUNCTION 04/09/20   Sheliah Hatch, MD  warfarin (COUMADIN) 10 MG tablet TAKE 1/2 TO 1 TABLET BY  MOUTH DAILY AS DIRECTED BY  COUMADIN CLINIC 01/04/20   Lewayne Bunting, MD    Family History    Family History  Problem Relation Age of Onset  . Heart attack Paternal Uncle   .  Heart attack Maternal Grandmother   . Heart disease Mother   . Hypertension Mother   . Diabetes Mother   . Alcohol abuse Father   . Cancer Neg Hx   . Early death Neg Hx   . Hyperlipidemia Neg Hx   . Kidney disease Neg Hx   . Stroke Neg Hx    Daniel Grant indicated that his mother is deceased. Daniel Grant indicated that his father is deceased. Daniel Grant indicated that his maternal grandmother is deceased. Daniel Grant indicated that his maternal grandfather is deceased. Daniel Grant indicated that his paternal grandmother is deceased. Daniel Grant indicated that his paternal grandfather is deceased. Daniel Grant indicated that his daughter is alive. Daniel Grant indicated that his son is alive. Daniel Grant indicated that his paternal uncle is deceased. Daniel Grant indicated that the status of his neg hx is unknown.  Social History    Social History   Socioeconomic History  . Marital status: Married    Spouse name: Not on file  . Number of children: Not on file  . Years of education: Not on file  . Highest education level: Not on file  Occupational History  . Not on file  Tobacco Use  . Smoking status: Former Smoker    Quit date: 05/26/1994    Years since quitting: 26.1  . Smokeless tobacco: Never Used  . Tobacco comment: quit in 1995  Vaping Use  . Vaping Use: Never used  Substance and Sexual Activity  . Alcohol use: Yes    Alcohol/week: 5.0 standard drinks    Types: 5 Cans of beer per week  . Drug use: No  . Sexual activity: Yes  Other Topics Concern  . Not on file  Social History Narrative  . Not on file   Social Determinants of Health   Financial Resource Strain: Not on file  Food Insecurity: Not on file  Transportation Needs: Not on file  Physical Activity: Not on file  Stress: Not on file  Social Connections: Not on file  Intimate Partner Violence: Not on file     Review of Systems    General:  No chills, fever, night sweats or weight changes.  Cardiovascular:  No chest pain, dyspnea on exertion, edema, orthopnea, palpitations, paroxysmal nocturnal  dyspnea. Dermatological: No rash, lesions/masses Respiratory: No cough, dyspnea Urologic: No hematuria, dysuria Abdominal:   No nausea, vomiting, diarrhea, bright red blood per rectum, melena, or hematemesis Neurologic:  No visual changes, wkns, changes in mental status. All other systems reviewed and are otherwise negative except as noted above.  Physical Exam    VS:  There were no vitals taken for this visit. , BMI There is no height or weight on file to calculate BMI. GEN: Well nourished, well developed, in no acute distress. HEENT: normal. Neck: Supple, no JVD, carotid bruits, or masses. Cardiac: RRR, no murmurs, rubs, or gallops. No clubbing, cyanosis, edema.  Radials/DP/PT  2+ and equal bilaterally.  Respiratory:  Respirations regular and unlabored, clear to auscultation bilaterally. GI: Soft, nontender, nondistended, BS + x 4. MS: no deformity or atrophy. Skin: warm and dry, no rash. Neuro:  Strength and sensation are intact. Psych: Normal affect.  Accessory Clinical Findings    Recent Labs: 08/22/2019: ALT 20; BUN 19; Creatinine, Ser 0.94; Hemoglobin 14.4; Platelets 178.0; Potassium 4.2; Sodium 138; TSH 0.53   Recent Lipid Panel    Component Value Date/Time   CHOL 109 08/22/2019 1110   TRIG 124.0 08/22/2019 1110   HDL 31.90 (L) 08/22/2019 1110   CHOLHDL 3 08/22/2019 1110   VLDL 24.8 08/22/2019 1110   LDLCALC 53 08/22/2019 1110   LDLDIRECT 80.0 12/15/2018 1359    ECG personally reviewed by me today- *** - No acute changes  Echocardiogram 07/22/2016  Study Conclusions   - Left ventricle: The cavity size was normal. There was  mild-moderate concentric hypertrophy. Systolic function was  normal. The estimated ejection fraction was in the range of 55%  to 60%. Wall motion was normal; there were no regional wall  motion abnormalities. Features are consistent with a pseudonormal  left ventricular filling pattern, with concomitant abnormal  relaxation and  increased filling pressure (grade 2 diastolic  dysfunction).  - Aortic valve: Transvalvular velocity was within the normal range.  There was no stenosis. There was no regurgitation.  - Mitral valve: Transvalvular velocity was within the normal range.  There was no evidence for stenosis. There was no regurgitation.  - Right ventricle: The cavity size was mildly dilated. Wall  thickness was normal. Systolic function was normal.  - Atrial septum: No defect or patent foramen ovale was identified  by color flow Doppler.  - Tricuspid valve: There was physiologic regurgitation.  - Pulmonary arteries: Systolic pressure was within the normal  range. PA peak pressure: 32 mm Hg (S). Assessment & Plan   1.  Paroxysmal atrial fibrillation-EKG today shows***.  Has not noticed any rapid heart rate or irregular heartbeats.  On Coumadin and denies bleeding issues. Continue flecainide, metoprolol, Coumadin Avoid triggers caffeine, chocolate, EtOH, dehydration etc.  Essential hypertension-BP today***.  Well-controlled at home Continue amlodipine, Benzapril, hydrochlorothiazide, metoprolol Heart healthy low-sodium diet-salty 6 given Increase physical activity as tolerated  Hyperlipidemia-08/22/2019: Cholesterol 109; HDL 31.90; LDL Cholesterol 53; Triglycerides 124.0; VLDL 24.8 Continue pravastatin, omega-3  Heart healthy low-sodium high-fiber diet Increase physical activity as tolerated  Type 2 diabetes-hemoglobin A1c 6.8 on 08/22/2019 Continue Metformin Heart healthy low-sodium carb modified diet Increase physical activity as tolerated  Disposition: Follow-up with Dr. Jens Som in 6 months.  Thomasene Ripple. Amberia Bayless NP-C    07/12/2020, 4:15 PM [facility] [address] Office [phone] Fax [phone]  Notice: This dictation was prepared with Dragon dictation along with smaller phrase technology. Any transcriptional errors that result from this  process are unintentional and may not be corrected upon review.  I spent***minutes examining this patient, reviewing medications, and using patient centered shared decision making involving her cardiac care.  Prior to her visit I spent greater than 20 minutes reviewing her past medical history,  medications, and prior cardiac tests.

## 2020-07-12 NOTE — Telephone Encounter (Signed)
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 Let him know Dr. Jens Som did not have any availability. Wants nurse to call to see him she can get him an early appt

## 2020-07-12 NOTE — Telephone Encounter (Signed)
Patient has an appointment with Edd Fabian, NP on 07/13/20.

## 2020-07-12 NOTE — Telephone Encounter (Signed)
Arrange appointment with Afib clinic on Tuesday 22, 2022 at 3:30 pm.   Spoke to patient. Instruction given for appointment with afib clinic. Patient stated  He had called in earlier  And had appt for tomorrow 07/13/20 at 11:45 am  With Verdon Cummins NP- patient states he need to discuss about D.O.T.  appointment cancelled- RN  informed patient to keep appointment on Tuesday 07/17/20- can take papers with him to appointment, if PA can not complete information. They will give instruction or set up another appointment at the [facility] office   RN  Informed patient that a new prescription of flecainide has been sent to local pharmacy  in case he does not have the medication at home.Patient also informed RN that he has been take medication and he has it home.   DIRECTION ALSO SENT VIA  MYCHART

## 2020-07-12 NOTE — Telephone Encounter (Signed)
Received a call from the patient's PCP office that he was currently going in and out of A. fib with RVR as high as 170 bpm. Completely asymptomatic.  Blood pressure hemodynamically stable. It appears that he is no longer taking his flecainide which is listed in his medication list.  Plan: Restart flecainide tonight-prescription to be called in => he will take to 100 mg tablets of flecainide tonight plus an additional 50 mg Toprol and then tomorrow begin restarting flecainide 100 mg twice daily along with standing dose of 150 mg Toprol.  He will be arranged to be seen in A. fib clinic for follow-up. Continue warfarin.   ,Bryan Lemma, MD

## 2020-07-13 ENCOUNTER — Other Ambulatory Visit: Payer: Self-pay | Admitting: Cardiology

## 2020-07-13 ENCOUNTER — Ambulatory Visit: Payer: 59 | Admitting: General Practice

## 2020-07-15 LAB — POCT INR: INR: 2.3 (ref 2.0–3.0)

## 2020-07-16 ENCOUNTER — Ambulatory Visit (INDEPENDENT_AMBULATORY_CARE_PROVIDER_SITE_OTHER): Payer: 59 | Admitting: Cardiovascular Disease

## 2020-07-16 DIAGNOSIS — I480 Paroxysmal atrial fibrillation: Secondary | ICD-10-CM

## 2020-07-17 ENCOUNTER — Ambulatory Visit: Payer: 59 | Admitting: Cardiology

## 2020-07-17 ENCOUNTER — Other Ambulatory Visit: Payer: Self-pay

## 2020-07-17 ENCOUNTER — Encounter: Payer: Self-pay | Admitting: Cardiology

## 2020-07-17 ENCOUNTER — Ambulatory Visit (HOSPITAL_COMMUNITY): Payer: 59 | Admitting: Physician Assistant

## 2020-07-17 VITALS — BP 118/80 | HR 116 | Ht 70.0 in | Wt 278.0 lb

## 2020-07-17 DIAGNOSIS — I480 Paroxysmal atrial fibrillation: Secondary | ICD-10-CM | POA: Diagnosis not present

## 2020-07-17 DIAGNOSIS — I10 Essential (primary) hypertension: Secondary | ICD-10-CM | POA: Diagnosis not present

## 2020-07-17 DIAGNOSIS — E785 Hyperlipidemia, unspecified: Secondary | ICD-10-CM

## 2020-07-17 DIAGNOSIS — E1169 Type 2 diabetes mellitus with other specified complication: Secondary | ICD-10-CM

## 2020-07-17 DIAGNOSIS — G4733 Obstructive sleep apnea (adult) (pediatric): Secondary | ICD-10-CM | POA: Diagnosis not present

## 2020-07-17 MED ORDER — METOPROLOL SUCCINATE ER 200 MG PO TB24: 200.0000 mg | ORAL_TABLET | Freq: Every day | ORAL | 3 refills | Status: DC

## 2020-07-17 NOTE — Progress Notes (Signed)
HPI: FU atrial fibrillation. Cardiac catheterization in January of 2001 showed normal LV function and normal coronary arteries. Nuclear study in June of 2011 showed soft tissue attenuation and no ischemia. Patient has been treated with flecainide for atrial fibrillation. He did have an exercise treadmill in September of 2011thatshowed no exercise induced ventricular tachycardia. Last echocardiogram February 2018 showed normal LV systolic function, moderate diastolic dysfunction, mild right ventricular enlargement. Patient was scheduled for cardioversion2/18but upon arrival was found to be in sinus rhythm. Since last seen, he denies dyspnea, chest pain, palpitations or syncope.  No bleeding.  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  However he was unaware as he was asymptomatic.  Current Outpatient Medications  Medication Sig Dispense Refill  . amLODipine (NORVASC) 5 MG tablet TAKE 1 TABLET BY MOUTH  DAILY 90 tablet 0  . benazepril (LOTENSIN) 20 MG tablet TAKE 1 TABLET BY MOUTH  DAILY 90 tablet 3  . flecainide (TAMBOCOR) 100 MG tablet Take 1 tablet (100 mg total) by mouth 2 (two) times daily. 60 tablet 1  . fluticasone (FLONASE) 50 MCG/ACT nasal spray Place 2 sprays into both nostrils daily. (Patient taking differently: Place 2 sprays into both nostrils daily as needed.) 16 g 5  . hydrochlorothiazide (HYDRODIURIL) 12.5 MG tablet TAKE 1 TABLET BY MOUTH  DAILY 90 tablet 0  . metFORMIN (GLUCOPHAGE-XR) 500 MG 24 hr tablet TAKE 2 TABLETS BY MOUTH  BEFORE BREAKFAST AND 1  TABLET WITH SUPPER DAILY 270 tablet 3  . metoprolol succinate (TOPROL-XL) 50 MG 24 hr tablet TAKE 3 TABLETS BY MOUTH  DAILY WITH OR IMMEDIATELY  FOLLOWING A MEAL 270 tablet 3  . Multiple Vitamin (MULTIVITAMIN) tablet Take 1 tablet by mouth daily.    . Omega-3 Fatty Acids (FISH OIL) 1200 MG CAPS Take 2 capsules by mouth 2 (two) times daily.    . pravastatin (PRAVACHOL) 40 MG tablet TAKE 1  TABLET BY MOUTH  DAILY 90 tablet 3  . sildenafil (VIAGRA) 100 MG tablet TAKE 1 TABLET BY MOUTH  DAILY AS NEEDED FOR  ERECTILE DYSFUNCTION 10 tablet 3  . warfarin (COUMADIN) 10 MG tablet TAKE 1/2 TO 1 TABLET BY  MOUTH DAILY AS DIRECTED BY  COUMADIN CLINIC 90 tablet 0   No current facility-administered medications for this visit.     Past Medical History:  Diagnosis Date  . DM II (diabetes mellitus, type II), controlled (HCC)   . Erectile dysfunction   . Hyperlipidemia   . Hypertension   . Obesity   . OSA (obstructive sleep apnea)   . Paroxysmal atrial fibrillation [facility])     Past Surgical History:  Procedure Laterality Date  . CARDIAC CATHETERIZATION    . FINGER SURGERY     left 1st finger    Social History   Socioeconomic History  . Marital status: Married    Spouse name: Not on file  . Number of children: Not on file  . Years of education: Not on file  . Highest education level: Not on file  Occupational History  . Not on file  Tobacco Use  . Smoking status: Former Smoker    Quit date: 05/26/1994    Years since quitting: 26.1  . Smokeless tobacco: Never Used  . Tobacco comment: quit in 1995  Vaping Use  . Vaping Use: Never used  Substance and Sexual Activity  . Alcohol use: Yes    Alcohol/week: 5.0 standard drinks  Types: 5 Cans of beer per week  . Drug use: No  . Sexual activity: Yes  Other Topics Concern  . Not on file  Social History Narrative  . Not on file   Social Determinants of Health   Financial Resource Strain: Not on file  Food Insecurity: Not on file  Transportation Needs: Not on file  Physical Activity: Not on file  Stress: Not on file  Social Connections: Not on file  Intimate Partner Violence: Not on file    Family History  Problem Relation Age of Onset  . Heart attack Paternal Uncle   . Heart attack Maternal Grandmother   . Heart disease Mother   . Hypertension Mother   . Diabetes Mother   . Alcohol abuse Father   . Cancer Neg  Hx   . Early death Neg Hx   . Hyperlipidemia Neg Hx   . Kidney disease Neg Hx   . Stroke Neg Hx     ROS: no fevers or chills, productive cough, hemoptysis, dysphasia, odynophagia, melena, hematochezia, dysuria, hematuria, rash, seizure activity, orthopnea, PND, pedal edema, claudication. Remaining systems are negative.  Physical Exam: Well-developed obese in no acute distress.  Skin is warm and dry.  HEENT is normal.  Neck is supple.  Chest is clear to auscultation with normal expansion.  Cardiovascular exam is irregular and tachycardic Abdominal exam nontender or distended. No masses palpated. Extremities show no edema. neuro grossly intact  ECG-atrial fibrillation at a rate of 116, normal axis.  Personally reviewed  A/P  1 paroxysmal atrial fibrillation-patient has developed recurrent atrial fibrillation.  Duration is unclear as he is asymptomatic.  He certainly could be having paroxysmal atrial fibrillation.  We had lengthy discussions today concerning rate control versus rhythm control.  For now I will increase Toprol to 200 mg daily for better rate control and continue flecainide and Coumadin.  I will see him back in 6 to 8 weeks to see if atrial fibrillation persists.  If so we could consider cardioversion though rate control may be best long-term option as he is asymptomatic.  Plan repeat echocardiogram to reassess LV function.  Patient asked about continuing to drive/DOT requirements.  There is no contraindication for him to continue driving at this point.  2 hypertension-patient's blood pressure is controlled.  However I am increasing Toprol for better rate control and will therefore discontinue amlodipine.  Check potassium and renal function.  3 hyperlipidemia-continue statin.  Check lipids and liver.  4 obstructive sleep apnea-continue CPAP.  Olga Millers, MD

## 2020-07-17 NOTE — Patient Instructions (Addendum)
Medication Instructions:   STOP AMLODIPINE  INCREASE METOPROLOL TO 200 MG ONCE DAILY= 4 OF THE 50 MG TABLETS ONCE DAILY  *If you need a refill on your cardiac medications before your next appointment, please call your pharmacy*  Lab Work:  Your physician recommends that you return for lab work FASTING   Testing/Procedures:  Your physician has requested that you have an echocardiogram. Echocardiography is a painless test that uses sound waves to create images of your heart. It provides your doctor with information about the size and shape of your heart and how well your heart's chambers and valves are working. This procedure takes approximately one hour. There are no restrictions for this procedure.[address]     Follow-Up: At [facility], you and your health needs are our priority.  As part of our continuing mission to provide you with exceptional heart care, we have created designated Provider Care Teams.  These Care Teams include your primary Cardiologist (physician) and Advanced Practice Providers (APPs -  Physician Assistants and Nurse Practitioners) who all work together to provide you with the care you need, when you need it.  We recommend signing up for the patient portal called "MyChart".  Sign up information is provided on this After Visit Summary.  MyChart is used to connect with patients for Virtual Visits (Telemedicine).  Patients are able to view lab/test results, encounter notes, upcoming appointments, etc.  Non-urgent messages can be sent to your provider as well.   To learn more about what you can do with MyChart, go to [email or web address].    Your next appointment:   6-8 week(s)  The format for your next appointment:   In Person  Provider:   Olga Millers, MD

## 2020-07-18 ENCOUNTER — Encounter: Payer: Self-pay | Admitting: Family Medicine

## 2020-07-28 LAB — CBC
Hematocrit: 46.6 % (ref 37.5–51.0)
Hemoglobin: 15.8 g/dL (ref 13.0–17.7)
MCH: 29 pg (ref 26.6–33.0)
MCHC: 33.9 g/dL (ref 31.5–35.7)
MCV: 86 fL (ref 79–97)
Platelets: 236 10*3/uL (ref 150–450)
RBC: 5.45 x10E6/uL (ref 4.14–5.80)
RDW: 13.1 % (ref 11.6–15.4)
WBC: 10.9 10*3/uL — ABNORMAL HIGH (ref 3.4–10.8)

## 2020-07-28 LAB — COMPREHENSIVE METABOLIC PANEL
ALT: 31 IU/L (ref 0–44)
AST: 24 IU/L (ref 0–40)
Albumin/Globulin Ratio: 1.7 (ref 1.2–2.2)
Albumin: 4.5 g/dL (ref 3.8–4.9)
Alkaline Phosphatase: 111 IU/L (ref 44–121)
BUN/Creatinine Ratio: 17 (ref 9–20)
BUN: 20 mg/dL (ref 6–24)
Bilirubin Total: 1 mg/dL (ref 0.0–1.2)
CO2: 20 mmol/L (ref 20–29)
Calcium: 9.7 mg/dL (ref 8.7–10.2)
Chloride: 105 mmol/L (ref 96–106)
Creatinine, Ser: 1.17 mg/dL (ref 0.76–1.27)
Globulin, Total: 2.6 g/dL (ref 1.5–4.5)
Glucose: 120 mg/dL — ABNORMAL HIGH (ref 65–99)
Potassium: 4.5 mmol/L (ref 3.5–5.2)
Sodium: 141 mmol/L (ref 134–144)
Total Protein: 7.1 g/dL (ref 6.0–8.5)
eGFR: 73 mL/min/{1.73_m2} (ref >59)

## 2020-07-28 LAB — LIPID PANEL
Chol/HDL Ratio: 3.9 ratio (ref 0.0–5.0)
Cholesterol, Total: 128 mg/dL (ref 100–199)
HDL: 33 mg/dL — ABNORMAL LOW (ref >39)
LDL Chol Calc (NIH): 70 mg/dL (ref 0–99)
Triglycerides: 141 mg/dL (ref 0–149)
VLDL Cholesterol Cal: 25 mg/dL (ref 5–40)

## 2020-08-09 ENCOUNTER — Ambulatory Visit (HOSPITAL_COMMUNITY): Payer: 59 | Attending: Cardiology

## 2020-08-09 ENCOUNTER — Other Ambulatory Visit: Payer: Self-pay

## 2020-08-09 DIAGNOSIS — I480 Paroxysmal atrial fibrillation: Secondary | ICD-10-CM | POA: Diagnosis not present

## 2020-08-09 LAB — ECHOCARDIOGRAM COMPLETE
Area-P 1/2: 5.01 cm2
S' Lateral: 3.4 cm

## 2020-08-10 ENCOUNTER — Telehealth: Payer: 59 | Admitting: Family Medicine

## 2020-08-15 ENCOUNTER — Telehealth: Payer: Self-pay

## 2020-08-15 NOTE — Telephone Encounter (Signed)
lmom for overdue inr 

## 2020-08-19 LAB — POCT INR: INR: 3.6 — AB (ref 2.0–3.0)

## 2020-08-20 ENCOUNTER — Ambulatory Visit (INDEPENDENT_AMBULATORY_CARE_PROVIDER_SITE_OTHER): Payer: 59 | Admitting: Cardiology

## 2020-08-20 DIAGNOSIS — I480 Paroxysmal atrial fibrillation: Secondary | ICD-10-CM

## 2020-08-20 DIAGNOSIS — Z7901 Long term (current) use of anticoagulants: Secondary | ICD-10-CM | POA: Diagnosis not present

## 2020-08-23 ENCOUNTER — Telehealth: Payer: Self-pay | Admitting: Cardiology

## 2020-08-23 NOTE — Telephone Encounter (Signed)
Pt c/o Shortness Of Breath: STAT if SOB developed within the last 24 hours or pt is noticeably SOB on the phone  1. Are you currently SOB (can you hear that pt is SOB on the phone)? Not at this time  2. How long have you been experiencing SOB? aboutt a week  3. Are you SOB when sitting or when up moving around? moving around  4. Are you currently experiencing any other symptoms?  tired -pt wants to be see sooner than next Friday

## 2020-08-23 NOTE — Telephone Encounter (Signed)
RN spoke with the patient in regards to his concern of SOB. RN asked if he was having any swelling, pt stated that he wasn't at this time. RN asked with his history of atrial fib, if he could tell when he went into this rhythm, pt stated no, and that he couldn't tell if his heart was fluttering, which he stated he could tell this either. RN asked if he took his vitals, blood pressure and HR, and he said no, but his wife was a Charity fundraiser at [employer], and he would get her to take it and will log it and bring this with him to his appointment on 08/31/20. Pt was unaware that his appointment 08/28/2020, which he was told was going to be cancelled since MD was not going to be in the office, but wasn't told that the appointment was changed to Friday 08/31/2020. Pt stated he would be at the appointment and understood where to go. RN stated that if he had any other symptoms, like his heart fluttering, or more short of breath to call the office. Pt started he understood and didn't have another needs or concerns at this time.   Thanks Darl Pikes RN

## 2020-08-24 NOTE — Progress Notes (Signed)
HPI: FU atrial fibrillation. Cardiac catheterization in January of 2001 showed normal LV function and normal coronary arteries. Nuclear study in June of 2011 showed soft tissue attenuation and no ischemia. Patient has been treated with flecainide for atrial fibrillation. He did have an exercise treadmill in September of 2011thatshowed no exercise induced ventricular tachycardia. Echocardiogram repeated March 2022 and showed normal LV function, moderate left ventricular hypertrophy, moderate left atrial enlargement, mild mitral regurgitation.  Found to be in recurrent atrial fibrillation at last office visit.  Since last seen,  patient notes worsening dyspnea with more vigorous activities.  He does not notice this with normal activities.  No orthopnea, PND, pedal edema, chest pain, palpitations or syncope.  Current Outpatient Medications  Medication Sig Dispense Refill  . benazepril (LOTENSIN) 20 MG tablet TAKE 1 TABLET BY MOUTH  DAILY 90 tablet 3  . flecainide (TAMBOCOR) 100 MG tablet Take 1 tablet (100 mg total) by mouth 2 (two) times daily. 60 tablet 1  . fluticasone (FLONASE) 50 MCG/ACT nasal spray Place 2 sprays into both nostrils daily. (Patient taking differently: Place 2 sprays into both nostrils daily as needed.) 16 g 5  . hydrochlorothiazide (HYDRODIURIL) 12.5 MG tablet TAKE 1 TABLET BY MOUTH  DAILY 90 tablet 0  . metFORMIN (GLUCOPHAGE-XR) 500 MG 24 hr tablet TAKE 2 TABLETS BY MOUTH  BEFORE BREAKFAST AND 1  TABLET WITH SUPPER DAILY 270 tablet 3  . metoprolol succinate (TOPROL-XL) 200 MG 24 hr tablet Take 1 tablet (200 mg total) by mouth daily. WITH OR IMMEDIATELY  FOLLOWING A MEAL 90 tablet 3  . Multiple Vitamin (MULTIVITAMIN) tablet Take 1 tablet by mouth daily.    . Omega-3 Fatty Acids (FISH OIL) 1200 MG CAPS Take 2 capsules by mouth 2 (two) times daily.    . pravastatin (PRAVACHOL) 40 MG tablet TAKE 1 TABLET BY MOUTH  DAILY 90 tablet 3  . sildenafil (VIAGRA) 100 MG tablet TAKE 1  TABLET BY MOUTH  DAILY AS NEEDED FOR  ERECTILE DYSFUNCTION 10 tablet 3  . warfarin (COUMADIN) 10 MG tablet TAKE 1/2 TO 1 TABLET BY  MOUTH DAILY AS DIRECTED BY  COUMADIN CLINIC 90 tablet 0   No current facility-administered medications for this visit.     Past Medical History:  Diagnosis Date  . DM II (diabetes mellitus, type II), controlled (HCC)   . Erectile dysfunction   . Hyperlipidemia   . Hypertension   . Obesity   . OSA (obstructive sleep apnea)   . Paroxysmal atrial fibrillation [facility])     Past Surgical History:  Procedure Laterality Date  . CARDIAC CATHETERIZATION    . FINGER SURGERY     left 1st finger    Social History   Socioeconomic History  . Marital status: Married    Spouse name: Not on file  . Number of children: Not on file  . Years of education: Not on file  . Highest education level: Not on file  Occupational History  . Not on file  Tobacco Use  . Smoking status: Former Smoker    Quit date: 05/26/1994    Years since quitting: 26.2  . Smokeless tobacco: Never Used  . Tobacco comment: quit in 1995  Vaping Use  . Vaping Use: Never used  Substance and Sexual Activity  . Alcohol use: Yes    Alcohol/week: 5.0 standard drinks    Types: 5 Cans of beer per week  . Drug use: No  . Sexual activity: Yes  Other Topics Concern  . Not on file  Social History Narrative  . Not on file   Social Determinants of Health   Financial Resource Strain: Not on file  Food Insecurity: Not on file  Transportation Needs: Not on file  Physical Activity: Not on file  Stress: Not on file  Social Connections: Not on file  Intimate Partner Violence: Not on file    Family History  Problem Relation Age of Onset  . Heart attack Paternal Uncle   . Heart attack Maternal Grandmother   . Heart disease Mother   . Hypertension Mother   . Diabetes Mother   . Alcohol abuse Father   . Cancer Neg Hx   . Early death Neg Hx   . Hyperlipidemia Neg Hx   . Kidney disease Neg Hx    . Stroke Neg Hx     ROS: no fevers or chills, productive cough, hemoptysis, dysphasia, odynophagia, melena, hematochezia, dysuria, hematuria, rash, seizure activity, orthopnea, PND, pedal edema, claudication. Remaining systems are negative.  Physical Exam: Well-developed well-nourished in no acute distress.  Skin is warm and dry.  HEENT is normal.  Neck is supple.  Chest is clear to auscultation with normal expansion.  Cardiovascular exam is irregular Abdominal exam nontender or distended. No masses palpated. Extremities show no edema. neuro grossly intact  ECG-atrial fibrillation at a rate of 91, no ST changes.  Personally reviewed  A/P  1 persistent atrial fibrillation-patient remains in atrial fibrillation.  He now appears to be symptomatic with increased dyspnea on exertion with more vigorous activities.  We will continue Toprol, flecainide and Coumadin.  If INR therapeutic today we will arrange elective cardioversion.  It should be noted he has not had atrial fibrillation in several years and therefore it is not clear to me that he has failed flecainide.  If atrial fibrillation recurs soon after cardioversion would then need to consider different antiarrhythmic and would likely refer to atrial fibrillation clinic for consideration of Tikosyn.  Note his LV function is normal.    2 hypertension-blood pressure controlled.  However he states it is elevated at home.  I have asked him to bring his cuff to next office visit and we will correlate with ours.  Continue present medications and can adjust based on follow-up readings.  Note he is complaining of erectile dysfunction.  Once sinus reestablished will likely wean Toprol to off and treat with Cardizem.  Hopefully this would help with his above symptoms.  3 hyperlipidemia-continue statin.  4 obstructive sleep apnea-continue CPAP.  Olga Millers, MD

## 2020-08-28 ENCOUNTER — Ambulatory Visit: Payer: 59 | Admitting: Cardiology

## 2020-08-31 ENCOUNTER — Other Ambulatory Visit: Payer: Self-pay | Admitting: *Deleted

## 2020-08-31 ENCOUNTER — Other Ambulatory Visit: Payer: Self-pay

## 2020-08-31 ENCOUNTER — Encounter: Payer: Self-pay | Admitting: Cardiology

## 2020-08-31 ENCOUNTER — Ambulatory Visit: Payer: 59 | Admitting: Cardiology

## 2020-08-31 ENCOUNTER — Ambulatory Visit (INDEPENDENT_AMBULATORY_CARE_PROVIDER_SITE_OTHER): Payer: 59

## 2020-08-31 VITALS — BP 132/78 | HR 91 | Ht 70.0 in | Wt 267.0 lb

## 2020-08-31 DIAGNOSIS — E785 Hyperlipidemia, unspecified: Secondary | ICD-10-CM

## 2020-08-31 DIAGNOSIS — I10 Essential (primary) hypertension: Secondary | ICD-10-CM | POA: Diagnosis not present

## 2020-08-31 DIAGNOSIS — I480 Paroxysmal atrial fibrillation: Secondary | ICD-10-CM | POA: Diagnosis not present

## 2020-08-31 DIAGNOSIS — Z5181 Encounter for therapeutic drug level monitoring: Secondary | ICD-10-CM | POA: Diagnosis not present

## 2020-08-31 DIAGNOSIS — E1169 Type 2 diabetes mellitus with other specified complication: Secondary | ICD-10-CM

## 2020-08-31 DIAGNOSIS — G4733 Obstructive sleep apnea (adult) (pediatric): Secondary | ICD-10-CM

## 2020-08-31 LAB — POCT INR: INR: 3.1 — AB (ref 2.0–3.0)

## 2020-08-31 NOTE — Patient Instructions (Signed)
Self tester   Continue with 1/2 tablet (5mg ) every day except 10mg  every Monday and Friday. Follow up in 1 week.

## 2020-08-31 NOTE — Patient Instructions (Addendum)
  Testing/Procedures:  GO TO [address] [facility] ON Friday 09/07/20 @ 2:40 PM FOR COVID TESTING  You are scheduled for a Cardioversion on Monday 09/10/20 with Dr. Jens Som.  Please arrive at the [facility] (Main Entrance A) at [facility]: [address] [place], [place] [address] at 9:15  AM. (1 hour prior to procedure unless lab work is needed; if lab work is needed arrive 1.5 hours ahead)  DIET: Nothing to eat or drink after midnight except a sip of water with medications (see medication instructions below)  Medication Instructions:  DO NOT TAKE METOPROLOL THE MORNING OF THE PROCEDURE  Continue your anticoagulant: WARFARIN   You must have a responsible person to drive you home and stay in the waiting area during your procedure. Failure to do so could result in cancellation.  Bring your insurance cards.  *Special Note: Every effort is made to have your procedure done on time. Occasionally there are emergencies that occur at the hospital that may cause delays. Please be patient if a delay does occur.      Follow-Up: At [facility], you and your health needs are our priority.  As part of our continuing mission to provide you with exceptional heart care, we have created designated Provider Care Teams.  These Care Teams include your primary Cardiologist (physician) and Advanced Practice Providers (APPs -  Physician Assistants and Nurse Practitioners) who all work together to provide you with the care you need, when you need it.  We recommend signing up for the patient portal called "MyChart".  Sign up information is provided on this After Visit Summary.  MyChart is used to connect with patients for Virtual Visits (Telemedicine).  Patients are able to view lab/test results, encounter notes, upcoming appointments, etc.  Non-urgent messages can be sent to your provider as well.   To learn more about what you can do with MyChart, go to [email or web address].    Your  next appointment:   6 week(s)  The format for your next appointment:   In Person  Provider:   Olga Millers, MD

## 2020-09-05 ENCOUNTER — Encounter: Payer: 59 | Admitting: Family Medicine

## 2020-09-07 ENCOUNTER — Other Ambulatory Visit (HOSPITAL_COMMUNITY)
Admission: RE | Admit: 2020-09-07 | Discharge: 2020-09-07 | Disposition: A | Discharge status: Home / Self Care | Payer: 59 | Source: Ambulatory Visit | Attending: Cardiology | Admitting: Cardiology

## 2020-09-07 ENCOUNTER — Ambulatory Visit (INDEPENDENT_AMBULATORY_CARE_PROVIDER_SITE_OTHER): Payer: 59 | Admitting: Cardiovascular Disease

## 2020-09-07 DIAGNOSIS — Z01812 Encounter for preprocedural laboratory examination: Secondary | ICD-10-CM | POA: Insufficient documentation

## 2020-09-07 DIAGNOSIS — Z20822 Contact with and (suspected) exposure to covid-19: Secondary | ICD-10-CM | POA: Diagnosis not present

## 2020-09-07 DIAGNOSIS — Z7901 Long term (current) use of anticoagulants: Secondary | ICD-10-CM

## 2020-09-07 DIAGNOSIS — Z5181 Encounter for therapeutic drug level monitoring: Secondary | ICD-10-CM | POA: Diagnosis not present

## 2020-09-07 DIAGNOSIS — I480 Paroxysmal atrial fibrillation: Secondary | ICD-10-CM

## 2020-09-07 LAB — POCT INR: INR: 2.2 (ref 2.0–3.0)

## 2020-09-07 LAB — SARS CORONAVIRUS 2 (TAT 6-24 HRS): SARS Coronavirus 2: NEGATIVE

## 2020-09-10 ENCOUNTER — Other Ambulatory Visit: Payer: Self-pay

## 2020-09-10 ENCOUNTER — Encounter (HOSPITAL_COMMUNITY): Payer: Self-pay | Admitting: Cardiology

## 2020-09-10 ENCOUNTER — Ambulatory Visit (HOSPITAL_COMMUNITY): Payer: 59 | Admitting: Anesthesiology

## 2020-09-10 ENCOUNTER — Ambulatory Visit (HOSPITAL_COMMUNITY)
Admission: RE | Admit: 2020-09-10 | Discharge: 2020-09-10 | Disposition: A | Discharge status: Home / Self Care | Payer: 59 | Attending: Cardiology | Admitting: Cardiology

## 2020-09-10 ENCOUNTER — Encounter (HOSPITAL_COMMUNITY)
Admission: RE | Disposition: A | Discharge status: Home / Self Care | Payer: Self-pay | Source: Home / Self Care | Attending: Cardiology

## 2020-09-10 DIAGNOSIS — Z79899 Other long term (current) drug therapy: Secondary | ICD-10-CM | POA: Diagnosis not present

## 2020-09-10 DIAGNOSIS — Z87891 Personal history of nicotine dependence: Secondary | ICD-10-CM | POA: Diagnosis not present

## 2020-09-10 DIAGNOSIS — E785 Hyperlipidemia, unspecified: Secondary | ICD-10-CM | POA: Diagnosis not present

## 2020-09-10 DIAGNOSIS — G4733 Obstructive sleep apnea (adult) (pediatric): Secondary | ICD-10-CM | POA: Diagnosis not present

## 2020-09-10 DIAGNOSIS — Z7901 Long term (current) use of anticoagulants: Secondary | ICD-10-CM | POA: Diagnosis not present

## 2020-09-10 DIAGNOSIS — I4819 Other persistent atrial fibrillation: Secondary | ICD-10-CM | POA: Diagnosis not present

## 2020-09-10 DIAGNOSIS — Z7984 Long term (current) use of oral hypoglycemic drugs: Secondary | ICD-10-CM | POA: Diagnosis not present

## 2020-09-10 DIAGNOSIS — I10 Essential (primary) hypertension: Secondary | ICD-10-CM | POA: Diagnosis not present

## 2020-09-10 HISTORY — PX: CARDIOVERSION: SHX1299

## 2020-09-10 LAB — PROTIME-INR
INR: 2.4 — ABNORMAL HIGH (ref 0.8–1.2)
Prothrombin Time: 25.9 seconds — ABNORMAL HIGH (ref 11.4–15.2)

## 2020-09-10 LAB — POCT I-STAT, CHEM 8
BUN: 22 mg/dL — ABNORMAL HIGH (ref 6–20)
Calcium, Ion: 1.07 mmol/L — ABNORMAL LOW (ref 1.15–1.40)
Chloride: 105 mmol/L (ref 98–111)
Creatinine, Ser: 0.8 mg/dL (ref 0.61–1.24)
Glucose, Bld: 140 mg/dL — ABNORMAL HIGH (ref 70–99)
HCT: 45 % (ref 39.0–52.0)
Hemoglobin: 15.3 g/dL (ref 13.0–17.0)
Potassium: 4.8 mmol/L (ref 3.5–5.1)
Sodium: 138 mmol/L (ref 135–145)
TCO2: 26 mmol/L (ref 22–32)

## 2020-09-10 SURGERY — CARDIOVERSION
Anesthesia: General

## 2020-09-10 MED ORDER — PROPOFOL 10 MG/ML IV BOLUS
INTRAVENOUS | Status: DC | PRN
  Administered 2020-09-10: 80 mg via INTRAVENOUS

## 2020-09-10 MED ORDER — LIDOCAINE HCL (CARDIAC) PF 100 MG/5ML IV SOSY
PREFILLED_SYRINGE | INTRAVENOUS | Status: DC | PRN
  Administered 2020-09-10: 100 mg via INTRATRACHEAL

## 2020-09-10 MED ORDER — SODIUM CHLORIDE 0.9 % IV SOLN: INTRAVENOUS | Status: DC | PRN

## 2020-09-10 NOTE — Anesthesia Preprocedure Evaluation (Signed)
Anesthesia Evaluation  Patient identified by MRN, date of birth, ID band Patient awake    Reviewed: Allergy & Precautions, NPO status , Patient's Chart, lab work & pertinent test results  Airway Mallampati: II  TM Distance: >3 FB Neck ROM: Full    Dental no notable dental hx. (+) Dental Advisory Given, Teeth Intact   Pulmonary sleep apnea , former smoker,    Pulmonary exam normal breath sounds clear to auscultation       Cardiovascular hypertension, Pt. on medications and Pt. on home beta blockers  Rhythm:Irregular Rate:Tachycardia  Echo 08/09/2020 1. Pt in atrial fibrillation during the study.  2. Left ventricular ejection fraction, by estimation, is 55 to 60%. The left ventricle has normal function. The left ventricle has no regional wall motion abnormalities. There is moderate left ventricular hypertrophy. Left ventricular diastolic function could not be evaluated.  3. Right ventricular systolic function is normal. The right ventricular size is normal. Tricuspid regurgitation signal is inadequate for assessing PA pressure.  4. Left atrial size was moderately dilated.  5. The mitral valve is normal in structure. Mild mitral valve  regurgitation. No evidence of mitral stenosis.  6. The aortic valve is tricuspid. Aortic valve regurgitation is not visualized. No aortic stenosis is present.  7. The inferior vena cava is normal in size with greater than 50% respiratory variability, suggesting right atrial pressure of 3 mmHg.    Neuro/Psych negative neurological ROS     GI/Hepatic negative GI ROS, Neg liver ROS,   Endo/Other  diabetesMorbid obesity  Renal/GU negative Renal ROS     Musculoskeletal negative musculoskeletal ROS (+)   Abdominal   Peds  Hematology negative hematology ROS (+)   Anesthesia Other Findings   Reproductive/Obstetrics                             Anesthesia  Physical Anesthesia Plan  ASA: III  Anesthesia Plan: General   Post-op Pain Management:    Induction: Intravenous  PONV Risk Score and Plan: Propofol infusion, TIVA and Treatment may vary due to age or medical condition  Airway Management Planned: Mask  Additional Equipment:   Intra-op Plan:   Post-operative Plan:   Informed Consent: I have reviewed the patients History and Physical, chart, labs and discussed the procedure including the risks, benefits and alternatives for the proposed anesthesia with the patient or authorized representative who has indicated his/her understanding and acceptance.     Dental advisory given  Plan Discussed with: CRNA  Anesthesia Plan Comments:         Anesthesia Quick Evaluation

## 2020-09-10 NOTE — Transfer of Care (Signed)
Immediate Anesthesia Transfer of Care Note  Patient: Daniel Grant  Procedure(s) Performed: CARDIOVERSION (N/A )  Patient Location: PACU and Endoscopy Unit  Anesthesia Type:General  Level of Consciousness: awake, alert  and sedated  Airway & Oxygen Therapy: Patient connected to nasal cannula oxygen  Post-op Assessment: Post -op Vital signs reviewed and stable  Post vital signs: stable  Last Vitals:  Vitals Value Taken Time  BP    Temp    Pulse    Resp    SpO2      Last Pain:  Vitals:   09/10/20 0900  TempSrc: Oral  PainSc: 0-No pain         Complications: No complications documented.

## 2020-09-10 NOTE — Discharge Instructions (Signed)
    [facility] ENDOSCOPY [address] [place], [place]  [address] Phone:  [phone]   Patient: Daniel Grant  Date of Birth: 1965-08-31  Date of Visit: 09/10/2020   To Whom It May Concern:  Jaber Dunlow was seen and treated on 09/10/2020 and may be excused from work Monday 09/10/2020 and Tuesday 09/11/2020          Sincerely,     Treatment Team:  Attending Provider: Lewayne Bunting, MD

## 2020-09-10 NOTE — H&P (Addendum)
Office Visit  08/31/2020 [facility] Dondra Prader, MD  Cardiology  PAF (paroxysmal atrial fibrillation) (HCC) +3 more  Dx  Follow-up. Atrial Fibrillation; Referred by Sheliah Hatch, MD  Reason for Visit    Additional Documentation  Vitals:  BP 132/78  Pulse 91  Ht 5\' 10"  (1.778 m)  Wt 121.1 kg  SpO2 97%  BMI 38.31 kg/m  BSA 2.45 m    More Vitals  Flowsheets:  Anthropometrics,  NEWS,  MEWS Score    Encounter Info:  Billing Info,  History,  Allergies,  Detailed Report     All Notes   Patient Instructions by , RN at 08/31/2020 7:40 AM  Author: 10/31/2020, RN Author Type: Registered Nurse Filed: 08/31/2020 8:04 AM  Note Status: Addendum Cosign: Cosign Not Required Encounter Date: 08/31/2020  Editor: 10/31/2020, RN (Registered Nurse)      Prior Versions: 1. Freddi Starr, RN (Registered Nurse) at 08/31/2020 8:03 AM - Signed     Testing/Procedures:  GO TO [address] 10/31/2020 ON Friday 09/07/20 @ 2:40 PM FOR COVID TESTING  You are scheduled for a Cardioversion on Monday 09/10/20 with Dr. 09/12/20.  Please arrive at the [facility] (Main Entrance A) at [facility]: [address] [place], [place] at 9:15  AM. (1 hour prior to procedure unless lab work is needed; if lab work is needed arrive 1.5 hours ahead)  DIET: Nothing to eat or drink after midnight except a sip of water with medications (see medication instructions below)  Medication Instructions:  DO NOT TAKE METOPROLOL THE MORNING OF THE PROCEDURE  Continue your anticoagulant: WARFARIN   You must have a responsible person to drive you home and stay in the waiting area during your procedure. Failure to do so could result in cancellation.  Bring your insurance cards.  *Special Note: Every effort is made to have your procedure done on time. Occasionally there are emergencies that occur at the hospital that may cause  delays. Please be patient if a delay does occur.      Follow-Up: At [facility], you and your health needs are our priority. As part of our continuing mission to provide you with exceptional heart care, we have created designated Provider Care Teams. These Care Teams include your primary Cardiologist (physician) and Advanced Practice Providers (APPs -  Physician Assistants and Nurse Practitioners) who all work together to provide you with the care you need, when you need it.  We recommend signing up for the patient portal called "MyChart".  Sign up information is provided on this After Visit Summary.  MyChart is used to connect with patients for Virtual Visits (Telemedicine).  Patients are able to view lab/test results, encounter notes, upcoming appointments, etc.  Non-urgent messages can be sent to your provider as well.   To learn more about what you can do with MyChart, go to [facility].    Your next appointment:   6 week(s)  The format for your next appointment:   In Person  Provider:   [email or web address], MD           Progress Notes by Olga Millers, MD at 08/31/2020 7:40 AM  Author: 10/31/2020, MD Author Type: Physician Filed: 08/31/2020 7:47 AM  Note Status: Signed Cosign: Cosign Not Required Encounter Date: 08/31/2020  Editor: 10/31/2020, MD (Physician)  HPI: FU atrial fibrillation. Cardiac catheterization in January of 2001 showed normal LV function and normal coronary arteries. Nuclear study in June of 2011 showed soft tissue attenuation and no ischemia. Patient has been treated with flecainide for atrial fibrillation. He did have an exercise treadmill in September of 2011thatshowed no exercise induced ventricular tachycardia. Echocardiogram repeated March 2022 and showed normal LV function, moderate left ventricular hypertrophy, moderate left atrial enlargement, mild mitral regurgitation.  Found to be in  recurrent atrial fibrillation at last office visit.  Since last seen, patient notes worsening dyspnea with more vigorous activities.  He does not notice this with normal activities.  No orthopnea, PND, pedal edema, chest pain, palpitations or syncope.        Current Outpatient Medications  Medication Sig Dispense Refill  . benazepril (LOTENSIN) 20 MG tablet TAKE 1 TABLET BY MOUTH  DAILY 90 tablet 3  . flecainide (TAMBOCOR) 100 MG tablet Take 1 tablet (100 mg total) by mouth 2 (two) times daily. 60 tablet 1  . fluticasone (FLONASE) 50 MCG/ACT nasal spray Place 2 sprays into both nostrils daily. (Patient taking differently: Place 2 sprays into both nostrils daily as needed.) 16 g 5  . hydrochlorothiazide (HYDRODIURIL) 12.5 MG tablet TAKE 1 TABLET BY MOUTH  DAILY 90 tablet 0  . metFORMIN (GLUCOPHAGE-XR) 500 MG 24 hr tablet TAKE 2 TABLETS BY MOUTH  BEFORE BREAKFAST AND 1  TABLET WITH SUPPER DAILY 270 tablet 3  . metoprolol succinate (TOPROL-XL) 200 MG 24 hr tablet Take 1 tablet (200 mg total) by mouth daily. WITH OR IMMEDIATELY  FOLLOWING A MEAL 90 tablet 3  . Multiple Vitamin (MULTIVITAMIN) tablet Take 1 tablet by mouth daily.    . Omega-3 Fatty Acids (FISH OIL) 1200 MG CAPS Take 2 capsules by mouth 2 (two) times daily.    . pravastatin (PRAVACHOL) 40 MG tablet TAKE 1 TABLET BY MOUTH  DAILY 90 tablet 3  . sildenafil (VIAGRA) 100 MG tablet TAKE 1 TABLET BY MOUTH  DAILY AS NEEDED FOR  ERECTILE DYSFUNCTION 10 tablet 3  . warfarin (COUMADIN) 10 MG tablet TAKE 1/2 TO 1 TABLET BY  MOUTH DAILY AS DIRECTED BY  COUMADIN CLINIC 90 tablet 0   No current facility-administered medications for this visit.         Past Medical History:  Diagnosis Date  . DM II (diabetes mellitus, type II), controlled (HCC)   . Erectile dysfunction   . Hyperlipidemia   . Hypertension   . Obesity   . OSA (obstructive sleep apnea)   . Paroxysmal atrial fibrillation [facility])          Past Surgical  History:  Procedure Laterality Date  . CARDIAC CATHETERIZATION    . FINGER SURGERY     left 1st finger    Social History        Socioeconomic History  . Marital status: Married    Spouse name: Not on file  . Number of children: Not on file  . Years of education: Not on file  . Highest education level: Not on file  Occupational History  . Not on file  Tobacco Use  . Smoking status: Former Smoker    Quit date: 05/26/1994    Years since quitting: 26.2  . Smokeless tobacco: Never Used  . Tobacco comment: quit in 1995  Vaping Use  . Vaping Use: Never used  Substance and Sexual Activity  . Alcohol use: Yes    Alcohol/week: 5.0 standard drinks    Types: 5  Cans of beer per week  . Drug use: No  . Sexual activity: Yes  Other Topics Concern  . Not on file  Social History Narrative  . Not on file   Social Determinants of Health   Financial Resource Strain: Not on file  Food Insecurity: Not on file  Transportation Needs: Not on file  Physical Activity: Not on file  Stress: Not on file  Social Connections: Not on file  Intimate Partner Violence: Not on file         Family History  Problem Relation Age of Onset  . Heart attack Paternal Uncle   . Heart attack Maternal Grandmother   . Heart disease Mother   . Hypertension Mother   . Diabetes Mother   . Alcohol abuse Father   . Cancer Neg Hx   . Early death Neg Hx   . Hyperlipidemia Neg Hx   . Kidney disease Neg Hx   . Stroke Neg Hx     ROS: no fevers or chills, productive cough, hemoptysis, dysphasia, odynophagia, melena, hematochezia, dysuria, hematuria, rash, seizure activity, orthopnea, PND, pedal edema, claudication. Remaining systems are negative.  Physical Exam: Well-developed well-nourished in no acute distress.  Skin is warm and dry.  HEENT is normal.  Neck is supple.  Chest is clear to auscultation with normal expansion.  Cardiovascular exam is irregular Abdominal  exam nontender or distended. No masses palpated. Extremities show no edema. neuro grossly intact  ECG-atrial fibrillation at a rate of 91, no ST changes.  Personally reviewed  A/P  1 persistent atrial fibrillation-patient remains in atrial fibrillation.  He now appears to be symptomatic with increased dyspnea on exertion with more vigorous activities.  We will continue Toprol, flecainide and Coumadin.  If INR therapeutic today we will arrange elective cardioversion.  It should be noted he has not had atrial fibrillation in several years and therefore it is not clear to me that he has failed flecainide.  If atrial fibrillation recurs soon after cardioversion would then need to consider different antiarrhythmic and would likely refer to atrial fibrillation clinic for consideration of Tikosyn.  Note his LV function is normal.    2 hypertension-blood pressure controlled.  However he states it is elevated at home.  I have asked him to bring his cuff to next office visit and we will correlate with ours.  Continue present medications and can adjust based on follow-up readings.  Note he is complaining of erectile dysfunction.  Once sinus reestablished will likely wean Toprol to off and treat with Cardizem.  Hopefully this would help with his above symptoms.  3 hyperlipidemia-continue statin.  4 obstructive sleep apnea-continue CPAP.  Olga Millers, MD       For DCCV; no changes; INR therapeutic. Olga Millers

## 2020-09-10 NOTE — Procedures (Signed)
Electrical Cardioversion Procedure Note Daniel Grant [id number] 15-Aug-1965  Procedure: Electrical Cardioversion Indications:  Atrial Fibrillation  Procedure Details Consent: Risks of procedure as well as the alternatives and risks of each were explained to the (patient/caregiver).  Consent for procedure obtained. Time Out: Verified patient identification, verified procedure, site/side was marked, verified correct patient position, special equipment/implants available, medications/allergies/relevent history reviewed, required imaging and test results available.  Performed  Patient placed on cardiac monitor, pulse oximetry, supplemental oxygen as necessary.  Sedation given: Pt sedated by anesthesia with lidocaine 100 mg and diprovan 80 mg IV. Pacer pads placed anterior and posterior chest.  Cardioverted 1 time(s).  Cardioverted at 200J.  Evaluation Findings: Post procedure EKG shows: NSR Complications: None Patient did tolerate procedure well.   Olga Millers 09/10/2020, 9:25 AM

## 2020-09-10 NOTE — Interval H&P Note (Signed)
History and Physical Interval Note:  09/10/2020 9:24 AM  Daniel Grant  has presented today for surgery, with the diagnosis of AFIB.  The various methods of treatment have been discussed with the patient and family. After consideration of risks, benefits and other options for treatment, the patient has consented to  Procedure(s): CARDIOVERSION (N/A) as a surgical intervention.  The patient's history has been reviewed, patient examined, no change in status, stable for surgery.  I have reviewed the patient's chart and labs.  Questions were answered to the patient's satisfaction.     Olga Millers

## 2020-09-11 ENCOUNTER — Encounter (HOSPITAL_COMMUNITY): Payer: Self-pay | Admitting: Cardiology

## 2020-09-11 NOTE — Anesthesia Postprocedure Evaluation (Signed)
Anesthesia Post Note  Patient: Daniel Grant  Procedure(s) Performed: CARDIOVERSION (N/A )     Patient location during evaluation: Endoscopy Anesthesia Type: General Level of consciousness: sedated and patient cooperative Pain management: pain level controlled Vital Signs Assessment: post-procedure vital signs reviewed and stable Respiratory status: spontaneous breathing Cardiovascular status: stable Anesthetic complications: no   No complications documented.  Last Vitals:  Vitals:   09/10/20 1000 09/10/20 1008  BP: (!) 157/92 (!) 136/91  Pulse: 70 66  Resp: 18 16  Temp:    SpO2: 100% 100%    Last Pain:  Vitals:   09/10/20 1008  TempSrc:   PainSc: 0-No pain                 Lewie Loron

## 2020-09-14 LAB — POCT INR: INR: 2 (ref 2.0–3.0)

## 2020-09-17 ENCOUNTER — Ambulatory Visit (INDEPENDENT_AMBULATORY_CARE_PROVIDER_SITE_OTHER): Payer: 59 | Admitting: Cardiology

## 2020-09-17 DIAGNOSIS — Z7901 Long term (current) use of anticoagulants: Secondary | ICD-10-CM

## 2020-09-17 DIAGNOSIS — I480 Paroxysmal atrial fibrillation: Secondary | ICD-10-CM | POA: Diagnosis not present

## 2020-10-03 NOTE — Progress Notes (Signed)
HPI: FU atrial fibrillation. Cardiac catheterization in January of 2001 showed normal LV function and normal coronary arteries. Nuclear study in June of 2011 showed soft tissue attenuation and no ischemia. Patient has been treated with flecainide for atrial fibrillation. He did have an exercise treadmill in September of 2011thatshowed no exercise induced ventricular tachycardia. Echocardiogram repeated March 2022 and showed normal LV function, moderate left ventricular hypertrophy, moderate left atrial enlargement, mild mitral regurgitation.  Found to be in recurrent atrial fibrillation at previous office visit.  Patient had successful cardioversion September 10, 2020. Since last seen,he has dyspnea with vigorous activities but not routine activities.  No orthopnea or PND chronic mild pedal edema.  No chest pain, palpitations or syncope.  Current Outpatient Medications  Medication Sig Dispense Refill  . benazepril (LOTENSIN) 20 MG tablet TAKE 1 TABLET BY MOUTH  DAILY 90 tablet 3  . flecainide (TAMBOCOR) 100 MG tablet Take 1 tablet (100 mg total) by mouth 2 (two) times daily. 60 tablet 1  . fluticasone (FLONASE) 50 MCG/ACT nasal spray Place 2 sprays into both nostrils daily. 16 g 5  . hydrochlorothiazide (HYDRODIURIL) 12.5 MG tablet TAKE 1 TABLET BY MOUTH  DAILY 90 tablet 0  . metFORMIN (GLUCOPHAGE-XR) 500 MG 24 hr tablet TAKE 2 TABLETS BY MOUTH  BEFORE BREAKFAST AND 1  TABLET WITH SUPPER DAILY 270 tablet 3  . metoprolol succinate (TOPROL-XL) 200 MG 24 hr tablet Take 1 tablet (200 mg total) by mouth daily. WITH OR IMMEDIATELY  FOLLOWING A MEAL 90 tablet 3  . Multiple Vitamin (MULTIVITAMIN) tablet Take 1 tablet by mouth daily.    . Omega-3 Fatty Acids (FISH OIL) 1200 MG CAPS Take 2,400 capsules by mouth 2 (two) times daily.    . pravastatin (PRAVACHOL) 40 MG tablet TAKE 1 TABLET BY MOUTH  DAILY 90 tablet 3  . sildenafil (VIAGRA) 100 MG tablet TAKE 1 TABLET BY MOUTH  DAILY AS NEEDED FOR  ERECTILE  DYSFUNCTION (Patient taking differently: Take 100 mg by mouth as needed for erectile dysfunction. TAKE 1 TABLET BY MOUTH  DAILY AS NEEDED FOR  ERECTILE DYSFUNCTION) 10 tablet 3  . warfarin (COUMADIN) 10 MG tablet TAKE 1/2 TO 1 TABLET BY  MOUTH DAILY AS DIRECTED BY  COUMADIN CLINIC (Patient taking differently: Take 5-10 mg by mouth See admin instructions. TAKE 1/2 TO 1 TABLET BY  MOUTH DAILY AS DIRECTED BY  COUMADIN CLINIC 10 mg on Mon and Fri, 5 mg all other days) 90 tablet 0   No current facility-administered medications for this visit.     Past Medical History:  Diagnosis Date  . DM II (diabetes mellitus, type II), controlled (HCC)   . Erectile dysfunction   . Hyperlipidemia   . Hypertension   . Obesity   . OSA (obstructive sleep apnea)   . Paroxysmal atrial fibrillation [facility])     Past Surgical History:  Procedure Laterality Date  . CARDIAC CATHETERIZATION    . CARDIOVERSION N/A 09/10/2020   Procedure: CARDIOVERSION;  Surgeon: Lewayne Bunting, MD;  Location[facility] ENDOSCOPY;  Service: Cardiovascular;  Laterality: N/A;  . FINGER SURGERY     left 1st finger    Social History   Socioeconomic History  . Marital status: Married    Spouse name: Not on file  . Number of children: Not on file  . Years of education: Not on file  . Highest education level: Not on file  Occupational History  . Not on file  Tobacco Use  .  Smoking status: Former Smoker    Quit date: 05/26/1994    Years since quitting: 26.4  . Smokeless tobacco: Never Used  . Tobacco comment: quit in 1995  Vaping Use  . Vaping Use: Never used  Substance and Sexual Activity  . Alcohol use: Yes    Alcohol/week: 5.0 standard drinks    Types: 5 Cans of beer per week  . Drug use: No  . Sexual activity: Yes  Other Topics Concern  . Not on file  Social History Narrative  . Not on file   Social Determinants of Health   Financial Resource Strain: Not on file  Food Insecurity: Not on file  Transportation Needs: Not  on file  Physical Activity: Not on file  Stress: Not on file  Social Connections: Not on file  Intimate Partner Violence: Not on file    Family History  Problem Relation Age of Onset  . Heart attack Paternal Uncle   . Heart attack Maternal Grandmother   . Heart disease Mother   . Hypertension Mother   . Diabetes Mother   . Alcohol abuse Father   . Cancer Neg Hx   . Early death Neg Hx   . Hyperlipidemia Neg Hx   . Kidney disease Neg Hx   . Stroke Neg Hx     ROS: no fevers or chills, productive cough, hemoptysis, dysphasia, odynophagia, melena, hematochezia, dysuria, hematuria, rash, seizure activity, orthopnea, PND, pedal edema, claudication. Remaining systems are negative.  Physical Exam: Well-developed well-nourished in no acute distress.  Skin is warm and dry.  HEENT is normal.  Neck is supple.  Chest is clear to auscultation with normal expansion.  Cardiovascular exam is irregular and tachycardic Abdominal exam nontender or distended. No masses palpated. Extremities show no edema. neuro grossly intact  ECG-fibrillation at a rate of 109, no ST changes.  Personally reviewed  A/P  1 paroxysmal atrial fibrillation-patient has developed recurrent atrial fibrillation.  Note he was symptomatic previously prior to his cardioversion.  However he states he is not clearly symptomatic at present.  I would like to reestablish sinus rhythm given h/o symptoms and we discussed referral to atrial fibrillation clinic for consideration of Tikosyn or ablation.  However he would like to try rate control for now.  He understands the longer he is in atrial fibrillation the more difficult it may be to reestablish sinus rhythm.  I will see him back in approximately 12 weeks.  If he develops symptoms then we will proceed with referral to atrial fibrillation clinic as outlined above.  If not he may decide to continue with rate control long-term.  He is having difficulties with ED and I wonder if  Toprol is contributing.  I would like to wean that down and we will decrease to 100 mg daily.  Add Cardizem CD 180 mg daily.  We will continue to wean Toprol to off and increase Cardizem to see if this helps.  Continue Coumadin with goal INR 2-3.  Note we will continue flecainide for now as he has had paroxysms of atrial fibrillation in the past.  If atrial fibrillation persists at next office visit we will discontinue.    2 hypertension-blood pressure elevated; medication adjustments as outlined above.  Follow blood pressure.  3 hyperlipidemia-continue statin.  4 obstructive sleep apnea-continue CPAP.  Olga Millers, MD

## 2020-10-04 ENCOUNTER — Other Ambulatory Visit: Payer: Self-pay

## 2020-10-04 ENCOUNTER — Encounter: Payer: Self-pay | Admitting: Family Medicine

## 2020-10-04 ENCOUNTER — Ambulatory Visit (INDEPENDENT_AMBULATORY_CARE_PROVIDER_SITE_OTHER): Payer: 59 | Admitting: Family Medicine

## 2020-10-04 VITALS — BP 140/90 | HR 72 | Temp 97.2°F | Resp 18 | Ht 70.0 in | Wt 273.4 lb

## 2020-10-04 DIAGNOSIS — Z1159 Encounter for screening for other viral diseases: Secondary | ICD-10-CM

## 2020-10-04 DIAGNOSIS — E119 Type 2 diabetes mellitus without complications: Secondary | ICD-10-CM

## 2020-10-04 DIAGNOSIS — Z0000 Encounter for general adult medical examination without abnormal findings: Secondary | ICD-10-CM

## 2020-10-04 DIAGNOSIS — Z114 Encounter for screening for human immunodeficiency virus [HIV]: Secondary | ICD-10-CM

## 2020-10-04 DIAGNOSIS — Z125 Encounter for screening for malignant neoplasm of prostate: Secondary | ICD-10-CM

## 2020-10-04 NOTE — Progress Notes (Signed)
   Subjective:    Patient ID: Daniel Grant, male    DOB: [birth year], 55 y.o.   MRN: [id number]  HPI CPE- UTD on colonoscopy, Tdap, COVID.  Eye exam scheduled 5/20.  Reviewed past medical, surgical, family and social histories.   Patient Care Team    Relationship Specialty Notifications Start End  Sheliah Hatch, MD PCP - General Family Medicine  11/28/15   Herby Abraham, MD  Cardiology  04/25/11   Karie Soda, MD Consulting Physician General Surgery  04/21/16   Lewayne Bunting, MD Consulting Physician Cardiology  04/21/16     Health Maintenance  Topic Date Due  . HIV Screening  Never done  . Hepatitis C Screening  Never done  . FOOT EXAM  07/08/2019  . HEMOGLOBIN A1C  02/22/2020  . OPHTHALMOLOGY EXAM  06/07/2020  . INFLUENZA VACCINE  12/24/2020  . TETANUS/TDAP  09/07/2021  . COLONOSCOPY (Pts 45-43yrs Insurance coverage will need to be confirmed)  05/05/2024  . PNEUMOCOCCAL POLYSACCHARIDE VACCINE AGE 42-64 HIGH RISK  Completed  . COVID-19 Vaccine  Completed  . HPV VACCINES  Aged Out     Review of Systems Patient reports no vision/hearing changes, anorexia, fever ,adenopathy, persistant/recurrent hoarseness, swallowing issues, chest pain, palpitations, edema, persistant/recurrent cough, hemoptysis, dyspnea (rest,exertional, paroxysmal nocturnal), gastrointestinal  bleeding (melena, rectal bleeding), abdominal pain, excessive heart burn, GU symptoms (dysuria, hematuria, voiding/incontinence issues) syncope, focal weakness, memory loss, numbness & tingling, skin/hair/nail changes, depression, anxiety, abnormal bruising/bleeding, musculoskeletal symptoms/signs.   This visit occurred during the SARS-CoV-2 public health emergency.  Safety protocols were in place, including screening questions prior to the visit, additional usage of staff PPE, and extensive cleaning of exam room while observing appropriate contact time as indicated for disinfecting solutions.       Objective:    Physical Exam General Appearance:    Alert, cooperative, no distress, appears stated age, obese  Head:    Normocephalic, without obvious abnormality, atraumatic  Eyes:    PERRL, conjunctiva/corneas clear, EOM's intact, fundi    benign, both eyes       Ears:    Normal TM's and external ear canals, both ears  Nose:   Deferred due to COVID  Throat:   Neck:   Supple, symmetrical, trachea midline, no adenopathy;       thyroid:  No enlargement/tenderness/nodules  Back:     Symmetric, no curvature, ROM normal, no CVA tenderness  Lungs:     Clear to auscultation bilaterally, respirations unlabored  Chest wall:    No tenderness or deformity  Heart:    Regular rate and rhythm, S1 and S2 normal, no murmur, rub   or gallop  Abdomen:     Soft, non-tender, bowel sounds active all four quadrants,    no masses, no organomegaly  Genitalia:    deferred  Rectal:    Extremities:   Extremities normal, atraumatic, no cyanosis or edema  Pulses:   2+ and symmetric all extremities  Skin:   Skin color, texture, turgor normal, no rashes or lesions  Lymph nodes:   Cervical, supraclavicular, and axillary nodes normal  Neurologic:   CNII-XII intact. Normal strength, sensation and reflexes      throughout          Assessment & Plan:

## 2020-10-04 NOTE — Assessment & Plan Note (Signed)
Deteriorated.  Pt has gained 5 lbs since last visit.  Given his medical comorbidities, his BMI of 39.23 qualifies as morbidly obese.  Stressed need for healthy diet and regular exercise.  Will follow.

## 2020-10-04 NOTE — Patient Instructions (Addendum)
Follow up in 3-4 months to recheck diabetes Go to [facility] on Monday for labs We'll notify you of your lab results and make any changes if needed Continue to work on healthy diet and regular exercise Your heart is in rhythm today but if the machine keeps showing abnormal rhythm- let cardiology know Make sure you are taking the BENAZEPRIL, PRAVASTATIN, METFORMIN, HCTZ as directed (these should still be part of your daily regimen) Have your eye doctor send me a copy of their report Call with any questions or concerns Have a great summer!!!!

## 2020-10-04 NOTE — Assessment & Plan Note (Signed)
Chronic problem.  Pt states he is not taking his Metformin but then states he isn't sure what he is taking as his wife does his medications.  Eye exam scheduled for next week.  On ACE for renal protection.  Check labs.  Adjust meds prn.

## 2020-10-04 NOTE — Assessment & Plan Note (Signed)
Pt's PE WNL w/ exception of obesity.  UTD on colonoscopy, Tdap, COVID.  Will get HIV and Hep C screens.  Check labs.  Anticipatory guidance provided.

## 2020-10-12 ENCOUNTER — Ambulatory Visit: Payer: 59 | Admitting: Cardiology

## 2020-10-12 ENCOUNTER — Other Ambulatory Visit: Payer: Self-pay

## 2020-10-12 ENCOUNTER — Encounter: Payer: Self-pay | Admitting: Cardiology

## 2020-10-12 ENCOUNTER — Other Ambulatory Visit: Payer: Self-pay | Admitting: *Deleted

## 2020-10-12 VITALS — BP 132/98 | HR 109 | Ht 71.0 in | Wt 273.0 lb

## 2020-10-12 DIAGNOSIS — I10 Essential (primary) hypertension: Secondary | ICD-10-CM | POA: Diagnosis not present

## 2020-10-12 DIAGNOSIS — E785 Hyperlipidemia, unspecified: Secondary | ICD-10-CM

## 2020-10-12 DIAGNOSIS — E1169 Type 2 diabetes mellitus with other specified complication: Secondary | ICD-10-CM | POA: Diagnosis not present

## 2020-10-12 DIAGNOSIS — I480 Paroxysmal atrial fibrillation: Secondary | ICD-10-CM

## 2020-10-12 MED ORDER — DILTIAZEM HCL ER COATED BEADS 180 MG PO CP24: 180.0000 mg | ORAL_CAPSULE | Freq: Every day | ORAL | 3 refills | Status: DC

## 2020-10-12 MED ORDER — BENAZEPRIL HCL 40 MG PO TABS
40.0000 mg | ORAL_TABLET | Freq: Every day | ORAL | 3 refills | Status: DC
Start: 2020-10-12 — End: 2020-10-12

## 2020-10-12 MED ORDER — FLECAINIDE ACETATE 100 MG PO TABS: 100.0000 mg | ORAL_TABLET | Freq: Two times a day (BID) | ORAL | 3 refills | Status: DC

## 2020-10-12 MED ORDER — BENAZEPRIL HCL 20 MG PO TABS: 20.0000 mg | ORAL_TABLET | Freq: Every day | ORAL | 3 refills | Status: DC

## 2020-10-12 MED ORDER — METOPROLOL SUCCINATE ER 100 MG PO TB24: 100.0000 mg | ORAL_TABLET | Freq: Every day | ORAL | 3 refills | Status: DC

## 2020-10-12 NOTE — Patient Instructions (Signed)
Medication Instructions:   DECREASE METOPROLOL TO 100 MG ONCE DAILY=1/2 OF THE 200 MG TABLET ONCE DAILY  START DILTIAZEM CD 180 MG ONCE DAILY  *If you need a refill on your cardiac medications before your next appointment, please call your pharmacy*   Follow-Up: At [facility], you and your health needs are our priority.  As part of our continuing mission to provide you with exceptional heart care, we have created designated Provider Care Teams.  These Care Teams include your primary Cardiologist (physician) and Advanced Practice Providers (APPs -  Physician Assistants and Nurse Practitioners) who all work together to provide you with the care you need, when you need it.  We recommend signing up for the patient portal called "MyChart".  Sign up information is provided on this After Visit Summary.  MyChart is used to connect with patients for Virtual Visits (Telemedicine).  Patients are able to view lab/test results, encounter notes, upcoming appointments, etc.  Non-urgent messages can be sent to your provider as well.   To learn more about what you can do with MyChart, go to [email or web address].    Your next appointment:   12 week(s)  The format for your next appointment:   In Person  Provider:   Olga Millers, MD

## 2020-10-15 ENCOUNTER — Ambulatory Visit (INDEPENDENT_AMBULATORY_CARE_PROVIDER_SITE_OTHER): Payer: 59 | Admitting: Cardiology

## 2020-10-15 DIAGNOSIS — Z7901 Long term (current) use of anticoagulants: Secondary | ICD-10-CM

## 2020-10-15 DIAGNOSIS — I480 Paroxysmal atrial fibrillation: Secondary | ICD-10-CM | POA: Diagnosis not present

## 2020-10-15 LAB — POCT INR: INR: 2.6 (ref 2.0–3.0)

## 2020-10-16 ENCOUNTER — Other Ambulatory Visit (INDEPENDENT_AMBULATORY_CARE_PROVIDER_SITE_OTHER): Payer: 59

## 2020-10-16 DIAGNOSIS — Z1159 Encounter for screening for other viral diseases: Secondary | ICD-10-CM

## 2020-10-16 DIAGNOSIS — Z125 Encounter for screening for malignant neoplasm of prostate: Secondary | ICD-10-CM

## 2020-10-16 DIAGNOSIS — E119 Type 2 diabetes mellitus without complications: Secondary | ICD-10-CM

## 2020-10-16 DIAGNOSIS — Z114 Encounter for screening for human immunodeficiency virus [HIV]: Secondary | ICD-10-CM

## 2020-10-16 LAB — CBC WITH DIFFERENTIAL/PLATELET
Basophils Absolute: 0.1 10*3/uL (ref 0.0–0.1)
Basophils Relative: 1 % (ref 0.0–3.0)
Eosinophils Absolute: 0.3 10*3/uL (ref 0.0–0.7)
Eosinophils Relative: 2.9 % (ref 0.0–5.0)
HCT: 46.4 % (ref 39.0–52.0)
Hemoglobin: 15.8 g/dL (ref 13.0–17.0)
Lymphocytes Relative: 29.1 % (ref 12.0–46.0)
Lymphs Abs: 3.1 10*3/uL (ref 0.7–4.0)
MCHC: 34.2 g/dL (ref 30.0–36.0)
MCV: 84.1 fl (ref 78.0–100.0)
Monocytes Absolute: 0.8 10*3/uL (ref 0.1–1.0)
Monocytes Relative: 7.4 % (ref 3.0–12.0)
Neutro Abs: 6.4 10*3/uL (ref 1.4–7.7)
Neutrophils Relative %: 59.6 % (ref 43.0–77.0)
Platelets: 199 10*3/uL (ref 150.0–400.0)
RBC: 5.51 Mil/uL (ref 4.22–5.81)
RDW: 13.5 % (ref 11.5–15.5)
WBC: 10.8 10*3/uL — ABNORMAL HIGH (ref 4.0–10.5)

## 2020-10-16 LAB — HEPATIC FUNCTION PANEL
ALT: 30 U/L (ref 0–53)
AST: 23 U/L (ref 0–37)
Albumin: 4.2 g/dL (ref 3.5–5.2)
Alkaline Phosphatase: 92 U/L (ref 39–117)
Bilirubin, Direct: 0.2 mg/dL (ref 0.0–0.3)
Total Bilirubin: 0.7 mg/dL (ref 0.2–1.2)
Total Protein: 6.7 g/dL (ref 6.0–8.3)

## 2020-10-16 LAB — HEMOGLOBIN A1C: Hgb A1c MFr Bld: 7.2 % — ABNORMAL HIGH (ref 4.6–6.5)

## 2020-10-16 LAB — BASIC METABOLIC PANEL
BUN: 24 mg/dL — ABNORMAL HIGH (ref 6–23)
CO2: 25 mEq/L (ref 19–32)
Calcium: 9.2 mg/dL (ref 8.4–10.5)
Chloride: 104 mEq/L (ref 96–112)
Creatinine, Ser: 0.95 mg/dL (ref 0.40–1.50)
GFR: 89.07 mL/min (ref >60.00)
Glucose, Bld: 95 mg/dL (ref 70–99)
Potassium: 4.3 mEq/L (ref 3.5–5.1)
Sodium: 139 mEq/L (ref 135–145)

## 2020-10-16 LAB — LIPID PANEL
Cholesterol: 150 mg/dL (ref 0–200)
HDL: 35.8 mg/dL — ABNORMAL LOW (ref >39.00)
LDL Cholesterol: 82 mg/dL (ref 0–99)
NonHDL: 113.7
Total CHOL/HDL Ratio: 4
Triglycerides: 158 mg/dL — ABNORMAL HIGH (ref 0.0–149.0)
VLDL: 31.6 mg/dL (ref 0.0–40.0)

## 2020-10-16 LAB — PSA: PSA: 0.59 ng/mL (ref 0.10–4.00)

## 2020-10-16 LAB — TSH: TSH: 0.75 u[IU]/mL (ref 0.35–4.50)

## 2020-10-16 LAB — HM DIABETES EYE EXAM

## 2020-10-17 ENCOUNTER — Other Ambulatory Visit: Payer: Self-pay | Admitting: Cardiology

## 2020-10-17 LAB — HIV ANTIBODY (ROUTINE TESTING W REFLEX): HIV 1&2 Ab, 4th Generation: NONREACTIVE

## 2020-10-17 LAB — HEPATITIS C ANTIBODY
Hepatitis C Ab: NONREACTIVE
SIGNAL TO CUT-OFF: 0.01 (ref <1.00)

## 2020-11-18 LAB — POCT INR: INR: 4.5 — AB (ref 2.0–3.0)

## 2020-11-19 ENCOUNTER — Ambulatory Visit (INDEPENDENT_AMBULATORY_CARE_PROVIDER_SITE_OTHER): Payer: 59 | Admitting: Cardiology

## 2020-11-19 DIAGNOSIS — Z7901 Long term (current) use of anticoagulants: Secondary | ICD-10-CM | POA: Diagnosis not present

## 2020-11-19 DIAGNOSIS — I480 Paroxysmal atrial fibrillation: Secondary | ICD-10-CM

## 2020-11-21 ENCOUNTER — Encounter: Payer: Self-pay | Admitting: *Deleted

## 2020-11-25 LAB — POCT INR: INR: 3.1 — AB (ref 2.0–3.0)

## 2020-11-29 ENCOUNTER — Ambulatory Visit (INDEPENDENT_AMBULATORY_CARE_PROVIDER_SITE_OTHER): Payer: 59 | Admitting: Cardiovascular Disease

## 2020-11-29 DIAGNOSIS — Z7901 Long term (current) use of anticoagulants: Secondary | ICD-10-CM | POA: Diagnosis not present

## 2020-11-29 DIAGNOSIS — I480 Paroxysmal atrial fibrillation: Secondary | ICD-10-CM | POA: Diagnosis not present

## 2020-12-10 LAB — POCT INR: INR: 1.5 — AB (ref 2.0–3.0)

## 2020-12-11 ENCOUNTER — Encounter: Payer: Self-pay | Admitting: Cardiovascular Disease

## 2020-12-11 ENCOUNTER — Ambulatory Visit (INDEPENDENT_AMBULATORY_CARE_PROVIDER_SITE_OTHER): Payer: 59 | Admitting: Internal Medicine

## 2020-12-11 DIAGNOSIS — Z5181 Encounter for therapeutic drug level monitoring: Secondary | ICD-10-CM | POA: Diagnosis not present

## 2020-12-11 DIAGNOSIS — I480 Paroxysmal atrial fibrillation: Secondary | ICD-10-CM

## 2020-12-11 NOTE — Patient Instructions (Signed)
Description   Self tester continue with 1/2 tablet  every day except 1 tablet every Monday and Friday. Follow up in 2 weeks. (Okay to go back to every 4 weeks if INR back to range). Ate extra greens 7/3.     *Please discuss DOAc option with spouse during next INR check - note on file to discuss after insurance change*  Holding off for now.

## 2020-12-11 NOTE — Progress Notes (Signed)
This encounter was created in error - please disregard.

## 2020-12-18 LAB — POCT INR: INR: 2.7 (ref 2.0–3.0)

## 2020-12-19 ENCOUNTER — Ambulatory Visit (INDEPENDENT_AMBULATORY_CARE_PROVIDER_SITE_OTHER): Payer: 59 | Admitting: Cardiology

## 2020-12-19 DIAGNOSIS — Z5181 Encounter for therapeutic drug level monitoring: Secondary | ICD-10-CM | POA: Diagnosis not present

## 2020-12-19 DIAGNOSIS — I480 Paroxysmal atrial fibrillation: Secondary | ICD-10-CM

## 2021-01-01 ENCOUNTER — Ambulatory Visit: Payer: 59 | Admitting: Cardiology

## 2021-01-14 ENCOUNTER — Ambulatory Visit: Payer: 59 | Admitting: Cardiology

## 2021-01-15 ENCOUNTER — Telehealth: Payer: Self-pay

## 2021-01-15 NOTE — Telephone Encounter (Signed)
Warfarin over due inr lmom

## 2021-01-16 NOTE — Telephone Encounter (Signed)
Lmom for overdue inr 

## 2021-01-17 LAB — POCT INR: INR: 2.6 (ref 2.0–3.0)

## 2021-01-17 NOTE — Telephone Encounter (Signed)
Lmom for overdue inr 

## 2021-01-18 ENCOUNTER — Ambulatory Visit (INDEPENDENT_AMBULATORY_CARE_PROVIDER_SITE_OTHER): Payer: 59

## 2021-01-18 DIAGNOSIS — Z5181 Encounter for therapeutic drug level monitoring: Secondary | ICD-10-CM

## 2021-01-18 DIAGNOSIS — I480 Paroxysmal atrial fibrillation: Secondary | ICD-10-CM

## 2021-01-18 NOTE — Patient Instructions (Signed)
Description   Self tester Spoke with pt's wife and instructed for pt to continue with 1/2 tablet  every day except 1 tablet every Monday and Friday. Follow up in 4 weeks.     *Please discuss DOAc option with spouse during next INR check - note on file to discuss after insurance change*  Holding off for now.

## 2021-02-04 ENCOUNTER — Encounter: Payer: Self-pay | Admitting: Family Medicine

## 2021-02-04 ENCOUNTER — Ambulatory Visit: Payer: 59 | Admitting: Family Medicine

## 2021-02-04 ENCOUNTER — Other Ambulatory Visit: Payer: Self-pay

## 2021-02-04 VITALS — BP 118/80 | HR 80 | Temp 97.2°F | Resp 16 | Ht 70.5 in | Wt 274.6 lb

## 2021-02-04 DIAGNOSIS — I480 Paroxysmal atrial fibrillation: Secondary | ICD-10-CM | POA: Diagnosis not present

## 2021-02-04 DIAGNOSIS — Z23 Encounter for immunization: Secondary | ICD-10-CM

## 2021-02-04 DIAGNOSIS — E119 Type 2 diabetes mellitus without complications: Secondary | ICD-10-CM

## 2021-02-04 LAB — BASIC METABOLIC PANEL
BUN: 20 mg/dL (ref 6–23)
CO2: 26 mEq/L (ref 19–32)
Calcium: 9.9 mg/dL (ref 8.4–10.5)
Chloride: 102 mEq/L (ref 96–112)
Creatinine, Ser: 0.87 mg/dL (ref 0.40–1.50)
GFR: 95.82 mL/min (ref >60.00)
Glucose, Bld: 104 mg/dL — ABNORMAL HIGH (ref 70–99)
Potassium: 4.2 mEq/L (ref 3.5–5.1)
Sodium: 139 mEq/L (ref 135–145)

## 2021-02-04 LAB — HEMOGLOBIN A1C: Hgb A1c MFr Bld: 7.5 % — ABNORMAL HIGH (ref 4.6–6.5)

## 2021-02-04 NOTE — Progress Notes (Signed)
**Note Daniel-Identified via Obfuscation**    Subjective:    Patient ID: Daniel Grant, male    DOB: [birth year], 55 y.o.   MRN: [id number]  HPI DM- chronic problem, on Metformin XR 1000mg  QAM and 500mg  QPM w/ hx of good control.  On ACE for renal protection.  UTD on eye exam, foot exam.  Weight is stable.  Pt reports feeling good.  Denies CP, SOB, HAs, visual changes, abd pain, N/V.  No numbness/tingling of hands/feet.  Denies symptomatic lows.  Afib- pt reports he will have fatigue and SOB if in Afib.  Dr has mentioned Tikosyn since pt already had a cardioversion x2.  On Coumadin, Dilt, Flecainide, Metoprolol.   Review of Systems For ROS see HPI   This visit occurred during the SARS-CoV-2 public health emergency.  Safety protocols were in place, including screening questions prior to the visit, additional usage of staff PPE, and extensive cleaning of exam room while observing appropriate contact time as indicated for disinfecting solutions.      Objective:   Physical Exam Vitals reviewed.  Constitutional:      General: He is not in acute distress.    Appearance: He is well-developed. He is obese. He is not ill-appearing.  HENT:     Head: Normocephalic and atraumatic.  Eyes:     Extraocular Movements: Extraocular movements intact.     Conjunctiva/sclera: Conjunctivae normal.     Pupils: Pupils are equal, round, and reactive to light.  Neck:     Thyroid: No thyromegaly.  Cardiovascular:     Rate and Rhythm: Normal rate. Rhythm irregular.     Pulses: Normal pulses.     Heart sounds: Normal heart sounds. No murmur heard. Pulmonary:     Effort: Pulmonary effort is normal. No respiratory distress.     Breath sounds: Normal breath sounds.  Abdominal:     General: Bowel sounds are normal. There is no distension.     Palpations: Abdomen is soft.  Musculoskeletal:     Cervical back: Normal range of motion and neck supple.     Right lower leg: No edema.     Left lower leg: No edema.  Lymphadenopathy:     Cervical:  No cervical adenopathy.  Skin:    General: Skin is warm and dry.  Neurological:     General: No focal deficit present.     Mental Status: He is alert and oriented to person, place, and time.     Cranial Nerves: No cranial nerve deficit.  Psychiatric:        Mood and Affect: Mood normal.        Behavior: Behavior normal.          Assessment & Plan:

## 2021-02-04 NOTE — Patient Instructions (Signed)
Follow up in 3-4 months to recheck diabetes, BP, cholesterol We'll notify you of your lab results and make any changes if needed Continue to work on healthy diet and regular exercise- you can do it! Call with any questions or concerns Hang in there!!!

## 2021-02-05 NOTE — Assessment & Plan Note (Signed)
Chronic problem.  Tolerating Metformin without difficulty.  UTD on eye exam, foot exam.  On ACE for renal protection.  Check labs.  Adjust meds prn

## 2021-02-05 NOTE — Assessment & Plan Note (Signed)
Pt is again in Afib today.  He wants to know my thoughts on Tikosyn and whether this is something he should pursue.  He has already had at least 2 failed cardioversions and is on multiple medications at this time.  I told him that his Cardiology team is very smart and if they recommend Tikosyn, it's worth strongly considering.  Pt is fearful that he will have multiple hospital stays for this medication.  I told him it's typically just the loading dose and then he maintains this orally as an outpatient.  I also encouraged him to ask if there were any medication he could stop if and when he starts the Tikosyn b/c he is hesitant to add to his pill burden.

## 2021-02-15 ENCOUNTER — Telehealth: Payer: Self-pay

## 2021-02-15 NOTE — Telephone Encounter (Signed)
Lpm to check INR 

## 2021-02-17 LAB — POCT INR: INR: 5.3 — AB (ref 2.0–3.0)

## 2021-02-18 ENCOUNTER — Other Ambulatory Visit: Payer: Self-pay | Admitting: Cardiology

## 2021-02-18 ENCOUNTER — Ambulatory Visit (INDEPENDENT_AMBULATORY_CARE_PROVIDER_SITE_OTHER): Payer: 59 | Admitting: Cardiovascular Disease

## 2021-02-18 DIAGNOSIS — Z5181 Encounter for therapeutic drug level monitoring: Secondary | ICD-10-CM

## 2021-02-18 DIAGNOSIS — I480 Paroxysmal atrial fibrillation: Secondary | ICD-10-CM | POA: Diagnosis not present

## 2021-02-22 ENCOUNTER — Telehealth: Payer: Self-pay

## 2021-02-22 ENCOUNTER — Ambulatory Visit: Payer: 59 | Admitting: Physician Assistant

## 2021-02-22 NOTE — Telephone Encounter (Signed)
Called patient [name] to inform him that his 02/22/21 appointment with Juanda Crumble, PA-C at 8:15 PM will have to be cancelled and rescheduled. He asked if he could see Dr. Jens Som today and I informed patient that Dr. Jens Som does not have any openings for today that I can reschedule him for another day with Dr. Jens Som or with one his PA's or NP's. Patient stated that he wanted to let it be known he is not taking the Tikosyn and will not be having no ablation. I informed him that I will need to place him on a brief hold while I check to see if Dr. Ludwig Clarks nurse was here. I spoke with Stanton Kidney informing her of what patient stated she informed me he taking flecainide not Tikosyn. She gave me some dates to schedule him with Dr. Jens Som, a date to schedule him with Afib clinic and to ask about his heart rate (HR). Patient stated he is taking his warfarin, and flecainide that he will not be taking the Tikosyn like Dr. Jens Som was talking to him about as an option or doing the ablation. "He does not want to see no afib clinic and he don't want to hear what they got to say either". When patient was asked about his HR he said "I don't know, I haven't been keeping up with it. I know that when I walk up the stairs in my house I am out of breath some and I know that I am out of shape. It's nothing like if I feel like I am passing out or something. He said his wife is a Engineer, civil (consulting) and will make him go to the hospital if something is not right." I scheduled patient for an appointment with Dr. Jens Som for 03/18/21 at 2:30 PM. He thanked me for calling and making efforts to get him rescheduled.

## 2021-02-22 NOTE — Telephone Encounter (Signed)
Called to remind pt's wife to check INR.  I was told they were waiting on test strips.  Will call us next week.

## 2021-02-23 LAB — POCT INR: INR: 1.8 — AB (ref 2.0–3.0)

## 2021-02-25 ENCOUNTER — Ambulatory Visit (INDEPENDENT_AMBULATORY_CARE_PROVIDER_SITE_OTHER): Payer: 59 | Admitting: Cardiology

## 2021-02-25 DIAGNOSIS — Z5181 Encounter for therapeutic drug level monitoring: Secondary | ICD-10-CM | POA: Diagnosis not present

## 2021-02-25 DIAGNOSIS — I480 Paroxysmal atrial fibrillation: Secondary | ICD-10-CM

## 2021-03-12 NOTE — Progress Notes (Signed)
HPI: FU atrial fibrillation. Cardiac catheterization in January of 2001 showed normal LV function and normal coronary arteries. Nuclear study in June of 2011 showed soft tissue attenuation and no ischemia. Patient has been treated with flecainide for atrial fibrillation. He did have an exercise treadmill in September of 2011 that showed no exercise induced ventricular tachycardia. Echocardiogram repeated March 2022 and showed normal LV function, moderate left ventricular hypertrophy, moderate left atrial enlargement, mild mitral regurgitation.  Found to be in recurrent atrial fibrillation at previous office visit.  Patient had successful cardioversion September 10, 2020.  However atrial fibrillation recurred.  At last office visit I recommended evaluation for ablation versus initiation of Tikosyn.  However he declined wanting only rate control.  Since last seen, he has some fatigue and dyspnea with activities relieved with rest.  This is worse compared to when he is in sinus rhythm.  He denies orthopnea, PND, pedal edema, chest pain or syncope.  Current Outpatient Medications  Medication Sig Dispense Refill   benazepril (LOTENSIN) 20 MG tablet Take 1 tablet (20 mg total) by mouth daily. 90 tablet 3   flecainide (TAMBOCOR) 100 MG tablet Take 1 tablet (100 mg total) by mouth 2 (two) times daily. 180 tablet 3   fluticasone (FLONASE) 50 MCG/ACT nasal spray Place 2 sprays into both nostrils daily. 16 g 5   hydrochlorothiazide (HYDRODIURIL) 12.5 MG tablet TAKE 1 TABLET BY MOUTH  DAILY 90 tablet 3   metFORMIN (GLUCOPHAGE-XR) 500 MG 24 hr tablet TAKE 2 TABLETS BY MOUTH  BEFORE BREAKFAST AND 1  TABLET WITH SUPPER DAILY 270 tablet 3   metoprolol (TOPROL-XL) 100 MG 24 hr tablet Take 1 tablet (100 mg total) by mouth daily. WITH OR IMMEDIATELY  FOLLOWING A MEAL 90 tablet 3   Multiple Vitamin (MULTIVITAMIN) tablet Take 1 tablet by mouth daily.     Omega-3 Fatty Acids (FISH OIL) 1200 MG CAPS Take 2,400 capsules by  mouth 2 (two) times daily.     pravastatin (PRAVACHOL) 40 MG tablet TAKE 1 TABLET BY MOUTH  DAILY 90 tablet 3   sildenafil (VIAGRA) 100 MG tablet TAKE 1 TABLET BY MOUTH  DAILY AS NEEDED FOR  ERECTILE DYSFUNCTION (Patient taking differently: Take 100 mg by mouth as needed for erectile dysfunction. TAKE 1 TABLET BY MOUTH  DAILY AS NEEDED FOR  ERECTILE DYSFUNCTION) 10 tablet 3   warfarin (COUMADIN) 10 MG tablet TAKE 1/2 TO 1 TABLET BY  MOUTH DAILY AS DIRECTED BY  COUMADIN CLINIC 90 tablet 0   diltiazem (CARDIZEM CD) 180 MG 24 hr capsule Take 1 capsule (180 mg total) by mouth daily. 90 capsule 3   No current facility-administered medications for this visit.     Past Medical History:  Diagnosis Date   DM II (diabetes mellitus, type II), controlled (HCC)    Erectile dysfunction    Hyperlipidemia    Hypertension    Obesity    OSA (obstructive sleep apnea)    Paroxysmal atrial fibrillation [facility])     Past Surgical History:  Procedure Laterality Date   CARDIAC CATHETERIZATION     CARDIOVERSION N/A 09/10/2020   Procedure: CARDIOVERSION;  Surgeon: Lewayne Bunting, MD;  Location: [facility] ENDOSCOPY;  Service: Cardiovascular;  Laterality: N/A;   FINGER SURGERY     left 1st finger    Social History   Socioeconomic History   Marital status: Married    Spouse name: Not on file   Number of children: Not on file  Years of education: Not on file   Highest education level: Not on file  Occupational History   Not on file  Tobacco Use   Smoking status: Former    Types: Cigarettes    Quit date: 05/26/1994    Years since quitting: 26.8   Smokeless tobacco: Never   Tobacco comments:    quit in 1995  Vaping Use   Vaping Use: Never used  Substance and Sexual Activity   Alcohol use: Yes    Alcohol/week: 5.0 standard drinks    Types: 5 Cans of beer per week   Drug use: No   Sexual activity: Yes  Other Topics Concern   Not on file  Social History Narrative   Not on file   Social Determinants  of Health   Financial Resource Strain: Not on file  Food Insecurity: Not on file  Transportation Needs: Not on file  Physical Activity: Not on file  Stress: Not on file  Social Connections: Not on file  Intimate Partner Violence: Not on file    Family History  Problem Relation Age of Onset   Heart attack Paternal Uncle    Heart attack Maternal Grandmother    Heart disease Mother    Hypertension Mother    Diabetes Mother    Alcohol abuse Father    Cancer Neg Hx    Early death Neg Hx    Hyperlipidemia Neg Hx    Kidney disease Neg Hx    Stroke Neg Hx     ROS: no fevers or chills, productive cough, hemoptysis, dysphasia, odynophagia, melena, hematochezia, dysuria, hematuria, rash, seizure activity, orthopnea, PND, pedal edema, claudication. Remaining systems are negative.  Physical Exam: Well-developed well-nourished in no acute distress.  Skin is warm and dry.  HEENT is normal.  Neck is supple.  Chest is clear to auscultation with normal expansion.  Cardiovascular exam is irregular Abdominal exam nontender or distended. No masses palpated. Extremities show no edema. neuro grossly intact  ECG-atrial fibrillation at a rate of 87, no ST changes.  Personally reviewed  A/P  1 persistent atrial fibrillation-continue Cardizem for rate control.  Continue Coumadin.  He has failed flecainide.  I will discontinue.  Patient is symptomatic with increased fatigue and dyspnea with activities compared to when he is in sinus rhythm.  We discussed options today and he would like to discuss this further with electrophysiology.  I would like for him to consider ablation versus Tikosyn.  He is agreeable and we will arrange for evaluation with Dr. Johney Frame.  2 hypertension-blood pressure mildly elevated; we will follow and advance medications as needed.  3 hyperlipidemia-continue statin.  4 obstructive sleep apnea-continue CPAP.  However he states his wife tells him that he snores.  I will  arrange a sleep study to further evaluate adequacy of his CPAP and then follow-up with either Dr. Tresa Endo or Dr. Mayford Knife.  Olga Millers, MD

## 2021-03-18 ENCOUNTER — Encounter: Payer: Self-pay | Admitting: Cardiology

## 2021-03-18 ENCOUNTER — Telehealth: Payer: Self-pay | Admitting: *Deleted

## 2021-03-18 ENCOUNTER — Ambulatory Visit: Payer: 59 | Admitting: Cardiology

## 2021-03-18 ENCOUNTER — Other Ambulatory Visit: Payer: Self-pay

## 2021-03-18 VITALS — BP 140/91 | HR 87 | Ht 70.0 in | Wt 278.0 lb

## 2021-03-18 DIAGNOSIS — G4733 Obstructive sleep apnea (adult) (pediatric): Secondary | ICD-10-CM | POA: Diagnosis not present

## 2021-03-18 DIAGNOSIS — I10 Essential (primary) hypertension: Secondary | ICD-10-CM

## 2021-03-18 DIAGNOSIS — E1169 Type 2 diabetes mellitus with other specified complication: Secondary | ICD-10-CM

## 2021-03-18 DIAGNOSIS — I4819 Other persistent atrial fibrillation: Secondary | ICD-10-CM

## 2021-03-18 DIAGNOSIS — E785 Hyperlipidemia, unspecified: Secondary | ICD-10-CM

## 2021-03-18 LAB — POCT INR: INR: 4.2 — AB (ref 2.0–3.0)

## 2021-03-18 NOTE — Patient Instructions (Signed)
Medication Instructions:   STOP FLECAINIDE  *If you need a refill on your cardiac medications before your next appointment, please call your pharmacy*   Testing/Procedures:  Your physician has recommended that you have a sleep study. This test records several body functions during sleep, including: brain activity, eye movement, oxygen and carbon dioxide blood levels, heart rate and rhythm, breathing rate and rhythm, the flow of air through your mouth and nose, snoring, body muscle movements, and chest and belly movement.    Follow-Up: At [facility], you and your health needs are our priority.  As part of our continuing mission to provide you with exceptional heart care, we have created designated Provider Care Teams.  These Care Teams include your primary Cardiologist (physician) and Advanced Practice Providers (APPs -  Physician Assistants and Nurse Practitioners) who all work together to provide you with the care you need, when you need it.  We recommend signing up for the patient portal called "MyChart".  Sign up information is provided on this After Visit Summary.  MyChart is used to connect with patients for Virtual Visits (Telemedicine).  Patients are able to view lab/test results, encounter notes, upcoming appointments, etc.  Non-urgent messages can be sent to your provider as well.   To learn more about what you can do with MyChart, go to [email or web address].    Your next appointment:   3 month(s)  The format for your next appointment:   In Person  Provider:   Olga Millers, MD

## 2021-03-18 NOTE — Telephone Encounter (Signed)
Pt was due to have INR checked. Called pt and he stated that he would check it today at home with his self testing machine.

## 2021-03-19 ENCOUNTER — Other Ambulatory Visit: Payer: Self-pay | Admitting: Cardiology

## 2021-03-19 ENCOUNTER — Telehealth: Payer: Self-pay | Admitting: *Deleted

## 2021-03-19 ENCOUNTER — Ambulatory Visit (INDEPENDENT_AMBULATORY_CARE_PROVIDER_SITE_OTHER): Payer: 59 | Admitting: Cardiovascular Disease

## 2021-03-19 DIAGNOSIS — Z5181 Encounter for therapeutic drug level monitoring: Secondary | ICD-10-CM | POA: Diagnosis not present

## 2021-03-19 DIAGNOSIS — I480 Paroxysmal atrial fibrillation: Secondary | ICD-10-CM

## 2021-03-19 DIAGNOSIS — G4733 Obstructive sleep apnea (adult) (pediatric): Secondary | ICD-10-CM

## 2021-03-19 DIAGNOSIS — I10 Essential (primary) hypertension: Secondary | ICD-10-CM

## 2021-03-19 NOTE — Telephone Encounter (Signed)
This encounter was created in error - please disregard.

## 2021-03-19 NOTE — Patient Instructions (Signed)
Description   Self tester Patient held dose yesterday. Instructed to hold dose today and then continue 10mg  Monday and Friday and 1/2 tablet all other days of the week   *Please discuss DOAc option with spouse during next INR check - note on file to discuss after insurance change*  Holding off for now.

## 2021-03-19 NOTE — Telephone Encounter (Signed)
Left HST appointment details on VM. 

## 2021-03-19 NOTE — Addendum Note (Signed)
Addended by: Eather Colas on: 03/19/2021 09:38 AM   Modules accepted: Orders, Level of Service, SmartSet

## 2021-03-25 ENCOUNTER — Other Ambulatory Visit: Payer: Self-pay | Admitting: Family Medicine

## 2021-03-25 LAB — POCT INR: INR: 2.1 (ref 2.0–3.0)

## 2021-03-26 ENCOUNTER — Ambulatory Visit (INDEPENDENT_AMBULATORY_CARE_PROVIDER_SITE_OTHER): Payer: 59 | Admitting: Cardiovascular Disease

## 2021-03-26 DIAGNOSIS — Z5181 Encounter for therapeutic drug level monitoring: Secondary | ICD-10-CM | POA: Diagnosis not present

## 2021-03-26 NOTE — Patient Instructions (Addendum)
Description   Called and spoke to pt's wife and instructed him to start taking warfarin 1/2 a tablet daily except for 1 tablet on Fridays. Recheck INR in 1 week.    *Please discuss DOAc option with spouse during next INR check - note on file to discuss after insurance change*  Holding off for now.

## 2021-03-26 NOTE — Telephone Encounter (Signed)
Patient states he is returning a call, may be regarding his INR.

## 2021-03-29 ENCOUNTER — Ambulatory Visit: Payer: 59 | Admitting: Cardiology

## 2021-03-29 ENCOUNTER — Encounter: Payer: Self-pay | Admitting: Cardiology

## 2021-03-29 ENCOUNTER — Other Ambulatory Visit: Payer: Self-pay

## 2021-03-29 VITALS — BP 122/84 | HR 121 | Ht 70.0 in | Wt 267.0 lb

## 2021-03-29 DIAGNOSIS — E669 Obesity, unspecified: Secondary | ICD-10-CM

## 2021-03-29 DIAGNOSIS — I10 Essential (primary) hypertension: Secondary | ICD-10-CM | POA: Diagnosis not present

## 2021-03-29 DIAGNOSIS — E119 Type 2 diabetes mellitus without complications: Secondary | ICD-10-CM | POA: Diagnosis not present

## 2021-03-29 DIAGNOSIS — I4819 Other persistent atrial fibrillation: Secondary | ICD-10-CM

## 2021-03-29 DIAGNOSIS — G4733 Obstructive sleep apnea (adult) (pediatric): Secondary | ICD-10-CM | POA: Diagnosis not present

## 2021-03-29 NOTE — Progress Notes (Signed)
Electrophysiology Office Note:    Date:  03/29/2021   ID:  Daniel Grant, DOB 06-20-1965, MRN [id number]  PCP:  Midge Minium, MD  [facility] Cardiologist:  None  [facility] Electrophysiologist:  Vickie Epley, MD   Referring MD: Lelon Perla, MD   Chief Complaint: Atrial fibrillation  History of Present Illness:    Daniel Grant is a 55 y.o. male who presents for an evaluation of atrial fibrillation at the request of Dr. Stanford Breed. Their medical history includes diabetes, hypertension, hyperlipidemia, obesity, obstructive sleep apnea on CPAP.  The patient is in clinic today with his wife who have not previously met.  She is a Marine scientist here with [employer].  He has symptomatic atrial fibrillation.  He had a cardioversion earlier in 2022.  After the cardioversion he maintain normal rhythm for several weeks.  During that time he felt significantly better with improved energy levels.  He tells me that the longer he is in atrial fibrillation he can tell that his stamina has decreased.  He is interested in a rhythm control strategy.  He knows he can work on his weight.  He eats a lot of sweets and large portions.  He uses a CPAP nightly.  He has an appointment scheduled with sleep medicine to discuss his CPAP and may need an additional titration of his CPAP machine.     Past Medical History:  Diagnosis Date   DM II (diabetes mellitus, type II), controlled ([place])    Erectile dysfunction    Hyperlipidemia    Hypertension    Obesity    OSA (obstructive sleep apnea)    Paroxysmal atrial fibrillation ([place])     Past Surgical History:  Procedure Laterality Date   CARDIAC CATHETERIZATION     CARDIOVERSION N/A 09/10/2020   Procedure: CARDIOVERSION;  Surgeon: Lelon Perla, MD;  Location: [facility];  Service: Cardiovascular;  Laterality: N/A;   FINGER SURGERY     left 1st finger    Current Medications: Current Meds  Medication Sig   benazepril (LOTENSIN) 20 MG  tablet Take 1 tablet (20 mg total) by mouth daily.   fluticasone (FLONASE) 50 MCG/ACT nasal spray Place 2 sprays into both nostrils daily.   hydrochlorothiazide (HYDRODIURIL) 12.5 MG tablet TAKE 1 TABLET BY MOUTH  DAILY   metFORMIN (GLUCOPHAGE-XR) 500 MG 24 hr tablet TAKE 2 TABLETS BY MOUTH  BEFORE BREAKFAST AND 1  TABLET BY MOUTH WITH SUPPER   metoprolol (TOPROL-XL) 100 MG 24 hr tablet Take 1 tablet (100 mg total) by mouth daily. WITH OR IMMEDIATELY  FOLLOWING A MEAL   Multiple Vitamin (MULTIVITAMIN) tablet Take 1 tablet by mouth daily.   Omega-3 Fatty Acids (FISH OIL) 1200 MG CAPS Take 2,400 capsules by mouth 2 (two) times daily.   pravastatin (PRAVACHOL) 40 MG tablet TAKE 1 TABLET BY MOUTH  DAILY   sildenafil (VIAGRA) 100 MG tablet TAKE 1 TABLET BY MOUTH  DAILY AS NEEDED FOR  ERECTILE DYSFUNCTION (Patient taking differently: Take 100 mg by mouth as needed for erectile dysfunction. TAKE 1 TABLET BY MOUTH  DAILY AS NEEDED FOR  ERECTILE DYSFUNCTION)   warfarin (COUMADIN) 10 MG tablet TAKE 1/2 TO 1 TABLET BY  MOUTH DAILY AS DIRECTED BY  COUMADIN CLINIC     Allergies:   Patient has no known allergies.   Social History   Socioeconomic History   Marital status: Married    Spouse name: Not on file   Number of children: Not on file  Years of education: Not on file   Highest education level: Not on file  Occupational History   Not on file  Tobacco Use   Smoking status: Former    Types: Cigarettes    Quit date: 05/26/1994    Years since quitting: 26.8   Smokeless tobacco: Never   Tobacco comments:    quit in 1995  Vaping Use   Vaping Use: Never used  Substance and Sexual Activity   Alcohol use: Yes    Alcohol/week: 5.0 standard drinks    Types: 5 Cans of beer per week   Drug use: No   Sexual activity: Yes  Other Topics Concern   Not on file  Social History Narrative   Not on file   Social Determinants of Health   Financial Resource Strain: Not on file  Food Insecurity: Not on  file  Transportation Needs: Not on file  Physical Activity: Not on file  Stress: Not on file  Social Connections: Not on file     Family History: The patient's family history includes Alcohol abuse in his father; Diabetes in his mother; Heart attack in his maternal grandmother and paternal uncle; Heart disease in his mother; Hypertension in his mother. There is no history of Cancer, Early death, Hyperlipidemia, Kidney disease, or Stroke.  ROS:   Please see the history of present illness.    All other systems reviewed and are negative.  EKGs/Labs/Other Studies Reviewed:    The following studies were reviewed today:  August 09, 2020 echo Left ventricular function normal, 55% Right ventricular function normal Moderately dilated left atrium Mild MR  September 10, 2020 EKG post cardioversion QTC is 440 ms, ventricular rate 66 bpm, sinus rhythm  EKG:  The ekg ordered today demonstrates atrial fibrillation with rapid ventricular rate.  Ventricular rate of 121 bpm.  QTc is 477 ms.   Recent Labs: 10/16/2020: ALT 30; Hemoglobin 15.8; Platelets 199.0; TSH 0.75 02/04/2021: BUN 20; Creatinine, Ser 0.87; Potassium 4.2; Sodium 139  Recent Lipid Panel    Component Value Date/Time   CHOL 150 10/16/2020 1621   CHOL 128 07/27/2020 1015   TRIG 158.0 (H) 10/16/2020 1621   HDL 35.80 (L) 10/16/2020 1621   HDL 33 (L) 07/27/2020 1015   CHOLHDL 4 10/16/2020 1621   VLDL 31.6 10/16/2020 1621   LDLCALC 82 10/16/2020 1621   LDLCALC 70 07/27/2020 1015   LDLDIRECT 80.0 12/15/2018 1359    Physical Exam:    VS:  BP 122/84   Pulse (!) 121   Ht $R'5\' 10"'lc$  (1.778 m)   Wt 267 lb (121.1 kg)   SpO2 98%   BMI 38.31 kg/m     Wt Readings from Last 3 Encounters:  03/29/21 267 lb (121.1 kg)  03/18/21 278 lb (126.1 kg)  02/04/21 274 lb 9.6 oz (124.6 kg)     GEN:  Well nourished, well developed in no acute distress.  Obese HEENT: Normal NECK: No JVD; No carotid bruits LYMPHATICS: No  lymphadenopathy CARDIAC: Tachycardic, irregularly irregular, no murmurs, rubs, gallops RESPIRATORY:  Clear to auscultation without rales, wheezing or rhonchi  ABDOMEN: Soft, non-tender, non-distended MUSCULOSKELETAL:  No edema; No deformity  SKIN: Warm and dry NEUROLOGIC:  Alert and oriented x 3 PSYCHIATRIC:  Normal affect       ASSESSMENT:    1. Persistent atrial fibrillation ([place])   2. OSA (obstructive sleep apnea)   3. Essential hypertension   4. Controlled type 2 diabetes mellitus without complication, without long-term current use of insulin (  [place])   5. Obesity, unspecified classification, unspecified obesity type, unspecified whether serious comorbidity present    PLAN:    In order of problems listed above:  #Persistent atrial fibrillation Symptomatic.  Poorly rate controlled.  On Coumadin for stroke prophylaxis. Discussed rhythm control strategies with the patient including initiation of an antiarrhythmic drug such as dofetilide versus catheter ablation.  I do think he is a candidate for catheter ablation.  I would like him to lose some weight prior to the catheter ablation procedure.  We discussed this at length during today's appointment.  I discussed the procedure in detail include the risk, recovery and likelihood of success.  He would like to proceed with scheduling.  He will need a CT scan prior to the procedure.  Risk, benefits, and alternatives to EP study and radiofrequency ablation for afib were also discussed in detail today. These risks include but are not limited to stroke, bleeding, vascular damage, tamponade, perforation, damage to the esophagus, lungs, and other structures, pulmonary vein stenosis, worsening renal function, and death. The patient understands these risk and wishes to proceed.  We will therefore proceed with catheter ablation at the next available time.  Carto, ICE, anesthesia are requested for the procedure.  Will also obtain CT PV protocol prior to  the procedure to exclude LAA thrombus and further evaluate atrial anatomy.  #Obstructive sleep apnea He is using CPAP.  He has an appointment scheduled for later this month for repeat sleep study.  #Hypertension Controlled.  Continue current regimen  #Diabetes  #Obesity Discussed weight loss and the connection between his weight and likelihood of a having of successful ablation.      Total time spent with patient today 65 minutes. This includes reviewing records, evaluating the patient and coordinating care.  Medication Adjustments/Labs and Tests Ordered: Current medicines are reviewed at length with the patient today.  Concerns regarding medicines are outlined above.  Orders Placed This Encounter  Procedures   CT CARDIAC MORPH/PULM VEIN W/CM&W/O CA SCORE   Basic Metabolic Panel (BMET)   CBC w/Diff   EKG 12-Lead   No orders of the defined types were placed in this encounter.    Signed, Hilton Cork. Quentin Ore, MD, [facility], [facility] 03/29/2021 6:02 PM    Electrophysiology [facility]

## 2021-03-29 NOTE — Patient Instructions (Addendum)
Medication Instructions:  Your physician recommends that you continue on your current medications as directed. Please refer to the Current Medication list given to you today. *If you need a refill on your cardiac medications before your next appointment, please call your pharmacy*  Lab Work:  WEEKLY INR CHECKS:  January 25 February 1 February 8 February 15  Testing/Procedures: Your physician has requested that you have cardiac CT. Cardiac computed tomography (CT) is a painless test that uses an x-ray machine to take clear, detailed pictures of your heart.   Your physician has recommended that you have an ablation. Catheter ablation is a medical procedure used to treat some cardiac arrhythmias (irregular heartbeats). During catheter ablation, a long, thin, flexible tube is put into a blood vessel in your groin (upper thigh), or neck. This tube is called an ablation catheter. It is then guided to your heart through the blood vessel. Radio frequency waves destroy small areas of heart tissue where abnormal heartbeats may cause an arrhythmia to start. Please see the instruction sheet given to you today.  Follow-Up: SEE INSTRUCTION LETTER  Cardiac Ablation Cardiac ablation is a procedure to destroy, or ablate, a small amount of heart tissue in very specific places. The heart has many electrical connections. Sometimes these connections are abnormal and can cause the heart to beat very fast or irregularly. Ablating some of the areas that cause problems can improve the heart's rhythm or return it to normal. Ablation may be done for people who: Have Wolff-Parkinson-White syndrome. Have fast heart rhythms (tachycardia). Have taken medicines for an abnormal heart rhythm (arrhythmia) that were not effective or caused side effects. Have a high-risk heartbeat that may be life-threatening. During the procedure, a small incision is made in the neck or the groin, and a long, thin tube (catheter) is inserted  into the incision and moved to the heart. Small devices (electrodes) on the tip of the catheter will send out electrical currents. A type of X-ray (fluoroscopy) will be used to help guide the catheter and to provide images of the heart. Tell a health care provider about: Any allergies you have. All medicines you are taking, including vitamins, herbs, eye drops, creams, and over-the-counter medicines. Any problems you or family members have had with anesthetic medicines. Any blood disorders you have. Any surgeries you have had. Any medical conditions you have, such as kidney failure. Whether you are pregnant or may be pregnant. What are the risks? Generally, this is a safe procedure. However, problems may occur, including: Infection. Bruising and bleeding at the catheter insertion site. Bleeding into the chest, especially into the sac that surrounds the heart. This is a serious complication. Stroke or blood clots. Damage to nearby structures or organs. Allergic reaction to medicines or dyes. Need for a permanent pacemaker if the normal electrical system is damaged. A pacemaker is a small computer that sends electrical signals to the heart and helps your heart beat normally. The procedure not being fully effective. This may not be recognized until months later. Repeat ablation procedures are sometimes done. What happens before the procedure? Medicines Ask your health care provider about: Changing or stopping your regular medicines. This is especially important if you are taking diabetes medicines or blood thinners. Taking medicines such as aspirin and ibuprofen. These medicines can thin your blood. Do not take these medicines unless your health care provider tells you to take them. Taking over-the-counter medicines, vitamins, herbs, and supplements. General instructions Follow instructions from your health care provider about  eating or drinking restrictions. Plan to have someone take you  home from the hospital or clinic. If you will be going home right after the procedure, plan to have someone with you for 24 hours. Ask your health care provider what steps will be taken to prevent infection. What happens during the procedure?  An IV will be inserted into one of your veins. You will be given a medicine to help you relax (sedative). The skin on your neck or groin will be numbed. An incision will be made in your neck or your groin. A needle will be inserted through the incision and into a large vein in your neck or groin. A catheter will be inserted into the needle and moved to your heart. Dye may be injected through the catheter to help your surgeon see the area of the heart that needs treatment. Electrical currents will be sent from the catheter to ablate heart tissue in desired areas. There are three types of energy that may be used to do this: Heat (radiofrequency energy). Laser energy. Extreme cold (cryoablation). When the tissue has been ablated, the catheter will be removed. Pressure will be held on the insertion area to prevent a lot of bleeding. A bandage (dressing) will be placed over the insertion area. The exact procedure may vary among health care providers and hospitals. What happens after the procedure? Your blood pressure, heart rate, breathing rate, and blood oxygen level will be monitored until you leave the hospital or clinic. Your insertion area will be monitored for bleeding. You will need to lie still for a few hours to ensure that you do not bleed from the insertion area. Do not drive for 24 hours or as long as told by your health care provider. Summary Cardiac ablation is a procedure to destroy, or ablate, a small amount of heart tissue using an electrical current. This procedure can improve the heart rhythm or return it to normal. Tell your health care provider about any medical conditions you may have and all medicines you are taking to treat  them. This is a safe procedure, but problems may occur. Problems may include infection, bruising, damage to nearby organs or structures, or allergic reactions to medicines. Follow your health care provider's instructions about eating and drinking before the procedure. You may also be told to change or stop some of your medicines. After the procedure, do not drive for 24 hours or as long as told by your health care provider. This information is not intended to replace advice given to you by your health care provider. Make sure you discuss any questions you have with your health care provider. Document Revised: 03/21/2019 Document Reviewed: 03/21/2019 Elsevier Patient Education  [place].

## 2021-04-01 MED ORDER — METOPROLOL TARTRATE 100 MG PO TABS: 100.0000 mg | ORAL_TABLET | Freq: Once | ORAL | 0 refills | Status: DC

## 2021-04-01 NOTE — Telephone Encounter (Signed)
Pt made aware via mychart message that his metoprolol tartrate 100 mg -take one time dose of 100 mg po 2 hours prior to Cardiac CT was sent to his pharmacy [facility] on [address].  Also resent his Cardiac CT instructions to him via mychart and endorsed to him that he should not take his succinate the AM of his Cardiac CT, he should take his one time dose of metoprolol tartrate 100 mg by mouth 2 hours prior to Cardiac CT.  Pt aware to call or message with any additional questions or concerns regarding this message.

## 2021-04-03 LAB — POCT INR: INR: 2.5 (ref 2.0–3.0)

## 2021-04-04 ENCOUNTER — Ambulatory Visit (INDEPENDENT_AMBULATORY_CARE_PROVIDER_SITE_OTHER): Payer: 59 | Admitting: Cardiovascular Disease

## 2021-04-04 DIAGNOSIS — I480 Paroxysmal atrial fibrillation: Secondary | ICD-10-CM

## 2021-04-04 DIAGNOSIS — Z5181 Encounter for therapeutic drug level monitoring: Secondary | ICD-10-CM

## 2021-04-10 ENCOUNTER — Other Ambulatory Visit: Payer: Self-pay | Admitting: Family Medicine

## 2021-04-10 DIAGNOSIS — N522 Drug-induced erectile dysfunction: Secondary | ICD-10-CM

## 2021-04-17 ENCOUNTER — Ambulatory Visit (INDEPENDENT_AMBULATORY_CARE_PROVIDER_SITE_OTHER): Payer: 59 | Admitting: Cardiovascular Disease

## 2021-04-17 DIAGNOSIS — I480 Paroxysmal atrial fibrillation: Secondary | ICD-10-CM | POA: Diagnosis not present

## 2021-04-17 DIAGNOSIS — Z5181 Encounter for therapeutic drug level monitoring: Secondary | ICD-10-CM | POA: Diagnosis not present

## 2021-04-17 LAB — POCT INR: INR: 1.9 — AB (ref 2.0–3.0)

## 2021-04-22 ENCOUNTER — Other Ambulatory Visit: Payer: Self-pay | Admitting: Cardiology

## 2021-04-22 DIAGNOSIS — G4733 Obstructive sleep apnea (adult) (pediatric): Secondary | ICD-10-CM

## 2021-04-23 ENCOUNTER — Ambulatory Visit (HOSPITAL_BASED_OUTPATIENT_CLINIC_OR_DEPARTMENT_OTHER): Payer: 59 | Admitting: Cardiovascular Disease

## 2021-05-01 LAB — POCT INR
INR: 2.1 (ref 2.0–3.0)
INR: 2.1 (ref 2.0–3.0)

## 2021-05-02 ENCOUNTER — Ambulatory Visit (INDEPENDENT_AMBULATORY_CARE_PROVIDER_SITE_OTHER): Payer: 59 | Admitting: Cardiology

## 2021-05-02 DIAGNOSIS — I480 Paroxysmal atrial fibrillation: Secondary | ICD-10-CM | POA: Diagnosis not present

## 2021-05-02 DIAGNOSIS — Z5181 Encounter for therapeutic drug level monitoring: Secondary | ICD-10-CM

## 2021-05-08 ENCOUNTER — Telehealth: Payer: Self-pay

## 2021-05-08 NOTE — Telephone Encounter (Signed)
Letter has been sent to patient instructing them to call us if they are still interested in completing their sleep study. If we have not received a response from the patient within 30 days of this notice, the order will be cancelled and they will need to discuss the need for a sleep study at their next office visit.  ° °

## 2021-05-16 ENCOUNTER — Telehealth: Payer: Self-pay

## 2021-05-16 ENCOUNTER — Ambulatory Visit (INDEPENDENT_AMBULATORY_CARE_PROVIDER_SITE_OTHER): Payer: 59 | Admitting: Cardiovascular Disease

## 2021-05-16 DIAGNOSIS — I480 Paroxysmal atrial fibrillation: Secondary | ICD-10-CM | POA: Diagnosis not present

## 2021-05-16 DIAGNOSIS — Z5181 Encounter for therapeutic drug level monitoring: Secondary | ICD-10-CM

## 2021-05-16 LAB — POCT INR: INR: 1.8 — AB (ref 2.0–3.0)

## 2021-05-16 NOTE — Telephone Encounter (Signed)
Patient's wife is following up, requesting to speak with Casimiro Needle, RN to report INR. Casimiro Needle was unavailable for transfer.  Please return call to discuss when able.

## 2021-05-16 NOTE — Telephone Encounter (Signed)
I called and spoke to patient's wife and she will check pt's INR this afternoon and call to report.

## 2021-05-16 NOTE — Telephone Encounter (Signed)
I spoke to patient's wife and discussed INR result/recommendation.

## 2021-05-22 ENCOUNTER — Encounter: Payer: Self-pay | Admitting: Cardiology

## 2021-05-22 ENCOUNTER — Other Ambulatory Visit: Payer: Self-pay

## 2021-05-22 MED ORDER — PRAVASTATIN SODIUM 40 MG PO TABS: 40.0000 mg | ORAL_TABLET | Freq: Every day | ORAL | 3 refills | Status: DC

## 2021-05-23 ENCOUNTER — Other Ambulatory Visit: Payer: Self-pay

## 2021-05-23 MED ORDER — PRAVASTATIN SODIUM 40 MG PO TABS: 40.0000 mg | ORAL_TABLET | Freq: Every day | ORAL | 3 refills | Status: DC

## 2021-05-30 ENCOUNTER — Telehealth: Payer: Self-pay

## 2021-05-30 ENCOUNTER — Ambulatory Visit (INDEPENDENT_AMBULATORY_CARE_PROVIDER_SITE_OTHER): Payer: 59 | Admitting: Cardiology

## 2021-05-30 DIAGNOSIS — I480 Paroxysmal atrial fibrillation: Secondary | ICD-10-CM

## 2021-05-30 DIAGNOSIS — Z5181 Encounter for therapeutic drug level monitoring: Secondary | ICD-10-CM

## 2021-05-30 LAB — POCT INR: INR: 1.7 — AB (ref 2.0–3.0)

## 2021-05-30 NOTE — Telephone Encounter (Signed)
Lp's wife message to check INR.

## 2021-06-07 ENCOUNTER — Encounter: Payer: Self-pay | Admitting: Family Medicine

## 2021-06-07 ENCOUNTER — Ambulatory Visit: Payer: 59 | Admitting: Family Medicine

## 2021-06-07 VITALS — BP 120/76 | HR 83 | Temp 99.0°F | Resp 17 | Wt 275.4 lb

## 2021-06-07 DIAGNOSIS — E1169 Type 2 diabetes mellitus with other specified complication: Secondary | ICD-10-CM

## 2021-06-07 DIAGNOSIS — E785 Hyperlipidemia, unspecified: Secondary | ICD-10-CM

## 2021-06-07 DIAGNOSIS — E119 Type 2 diabetes mellitus without complications: Secondary | ICD-10-CM | POA: Diagnosis not present

## 2021-06-07 DIAGNOSIS — I10 Essential (primary) hypertension: Secondary | ICD-10-CM | POA: Diagnosis not present

## 2021-06-07 NOTE — Progress Notes (Signed)
° °  Subjective:    Patient ID: Daniel Grant, male    DOB: 1965-10-01, 56 y.o.   MRN: [id number]  HPI DM- chronic problem, on Metformin XR 500mg  2 tabs QAM and 1 tab QHS.  Last A1C 7.5%.  On ACE for renal protection.  UTD on eye exam, foot exam.  Denies symptomatic lows, no numbness/tingling of hands/feet.  HTN- chronic problem, on Benazepril 20mg  daily, HCTZ 12.5mg  daily, Metoprolol 100mg  daily, Dilt 180mg  daily w/ good control.  Denies CP, SOB, HAs, visual changes, edema.  Hyperlipidemia- chronic problem, on Pravastatin 40mg  daily.  No abd pain, N/V.  Obesity- pt has gained 8 lbs since last visit.  BMI now 39.52  Not following a particular diet, no regular exercise.   Review of Systems For ROS see HPI   This visit occurred during the SARS-CoV-2 public health emergency.  Safety protocols were in place, including screening questions prior to the visit, additional usage of staff PPE, and extensive cleaning of exam room while observing appropriate contact time as indicated for disinfecting solutions.      Objective:   Physical Exam Vitals reviewed.  Constitutional:      General: He is not in acute distress.    Appearance: Normal appearance. He is well-developed. He is obese. He is not ill-appearing.  HENT:     Head: Normocephalic and atraumatic.  Eyes:     Extraocular Movements: Extraocular movements intact.     Conjunctiva/sclera: Conjunctivae normal.     Pupils: Pupils are equal, round, and reactive to light.  Neck:     Thyroid: No thyromegaly.  Cardiovascular:     Rate and Rhythm: Normal rate. Rhythm irregular.     Pulses: Normal pulses.     Heart sounds: Normal heart sounds. No murmur heard. Pulmonary:     Effort: Pulmonary effort is normal. No respiratory distress.     Breath sounds: Normal breath sounds.  Abdominal:     General: Bowel sounds are normal. There is no distension.     Palpations: Abdomen is soft.  Musculoskeletal:     Cervical back: Normal range of motion  and neck supple.     Right lower leg: No edema.     Left lower leg: No edema.  Lymphadenopathy:     Cervical: No cervical adenopathy.  Skin:    General: Skin is warm and dry.  Neurological:     General: No focal deficit present.     Mental Status: He is alert and oriented to person, place, and time.     Cranial Nerves: No cranial nerve deficit.  Psychiatric:        Mood and Affect: Mood normal.        Behavior: Behavior normal.          Assessment & Plan:

## 2021-06-07 NOTE — Assessment & Plan Note (Signed)
Chronic problem.  Tolerating statin w/o difficulty.  Check labs.  Adjust meds prn  

## 2021-06-07 NOTE — Assessment & Plan Note (Signed)
Chronic problem.  Well controlled today on Benazepril, HCTZ, Metoprolol, and Dilt.  Check labs due to ACE and diuretic.  No anticipated med changes.

## 2021-06-07 NOTE — Assessment & Plan Note (Signed)
Deteriorated.  Pt has gained 8 lbs since last visit.  BMI now 39.52  Stressed need for healthy diet and regular exercise.  Will follow.

## 2021-06-07 NOTE — Patient Instructions (Signed)
Follow up in 3-4 months to recheck diabetes We'll notify you of your lab results and make any changes if needed Continue to work on healthy diet and regular exercise- you can do it!! Call with any questions or concerns Stay Safe!  Stay Healthy! Happy New Year!!!

## 2021-06-07 NOTE — Assessment & Plan Note (Signed)
Chronic problem.  Pt admits to poor dietary choices recently w/ holiday eating.  He has gained 8 lbs.  UTD on foot exam and eye exam.  On ACE for renal protection.  Encouraged low carb diet and regular exercise.  Check labs.  Adjust meds prn

## 2021-06-08 LAB — CBC WITH DIFFERENTIAL/PLATELET
Absolute Monocytes: 770 cells/uL (ref 200–950)
Basophils Absolute: 67 cells/uL (ref 0–200)
Basophils Relative: 0.7 %
Eosinophils Absolute: 257 cells/uL (ref 15–500)
Eosinophils Relative: 2.7 %
HCT: 45.2 % (ref 38.5–50.0)
Hemoglobin: 15.6 g/dL (ref 13.2–17.1)
Lymphs Abs: 2375 cells/uL (ref 850–3900)
MCH: 29 pg (ref 27.0–33.0)
MCHC: 34.5 g/dL (ref 32.0–36.0)
MCV: 84 fL (ref 80.0–100.0)
MPV: 11.6 fL (ref 7.5–12.5)
Monocytes Relative: 8.1 %
Neutro Abs: 6033 cells/uL (ref 1500–7800)
Neutrophils Relative %: 63.5 %
Platelets: 216 10*3/uL (ref 140–400)
RBC: 5.38 10*6/uL (ref 4.20–5.80)
RDW: 12.8 % (ref 11.0–15.0)
Total Lymphocyte: 25 %
WBC: 9.5 10*3/uL (ref 3.8–10.8)

## 2021-06-08 LAB — HEPATIC FUNCTION PANEL
AG Ratio: 1.5 (calc) (ref 1.0–2.5)
ALT: 24 U/L (ref 9–46)
AST: 17 U/L (ref 10–35)
Albumin: 4.1 g/dL (ref 3.6–5.1)
Alkaline phosphatase (APISO): 100 U/L (ref 35–144)
Bilirubin, Direct: 0.2 mg/dL (ref 0.0–0.2)
Globulin: 2.7 g/dL (calc) (ref 1.9–3.7)
Indirect Bilirubin: 0.4 mg/dL (calc) (ref 0.2–1.2)
Total Bilirubin: 0.6 mg/dL (ref 0.2–1.2)
Total Protein: 6.8 g/dL (ref 6.1–8.1)

## 2021-06-08 LAB — BASIC METABOLIC PANEL
BUN: 21 mg/dL (ref 7–25)
CO2: 26 mmol/L (ref 20–32)
Calcium: 9.7 mg/dL (ref 8.6–10.3)
Chloride: 108 mmol/L (ref 98–110)
Creat: 1.09 mg/dL (ref 0.70–1.30)
Glucose, Bld: 149 mg/dL — ABNORMAL HIGH (ref 65–99)
Potassium: 4.3 mmol/L (ref 3.5–5.3)
Sodium: 143 mmol/L (ref 135–146)

## 2021-06-08 LAB — LIPID PANEL
Cholesterol: 127 mg/dL (ref <200)
HDL: 33 mg/dL — ABNORMAL LOW (ref >=40)
LDL Cholesterol (Calc): 64 mg/dL (calc)
Non-HDL Cholesterol (Calc): 94 mg/dL (calc) (ref <130)
Total CHOL/HDL Ratio: 3.8 (calc) (ref <5.0)
Triglycerides: 254 mg/dL — ABNORMAL HIGH (ref <150)

## 2021-06-08 LAB — HEMOGLOBIN A1C
Hgb A1c MFr Bld: 7.6 % of total Hgb — ABNORMAL HIGH (ref <5.7)
Mean Plasma Glucose: 171 mg/dL
eAG (mmol/L): 9.5 mmol/L

## 2021-06-08 LAB — TSH: TSH: 0.55 mIU/L (ref 0.40–4.50)

## 2021-06-12 LAB — POCT INR: INR: 2.3 (ref 2.0–3.0)

## 2021-06-13 ENCOUNTER — Ambulatory Visit (INDEPENDENT_AMBULATORY_CARE_PROVIDER_SITE_OTHER): Payer: 59 | Admitting: Internal Medicine

## 2021-06-13 ENCOUNTER — Telehealth: Payer: Self-pay

## 2021-06-13 DIAGNOSIS — I480 Paroxysmal atrial fibrillation: Secondary | ICD-10-CM

## 2021-06-13 DIAGNOSIS — Z5181 Encounter for therapeutic drug level monitoring: Secondary | ICD-10-CM | POA: Diagnosis not present

## 2021-06-13 NOTE — Telephone Encounter (Signed)
Patient is aware of labs °

## 2021-06-13 NOTE — Telephone Encounter (Signed)
-----   Message from Sheliah Hatch, MD sent at 06/10/2021  7:34 AM EST ----- A1C is stable.  This will improve w/ healthy food choices and regular exercise.  No med changes at this time.  Triglycerides (fatty part of blood) are much higher than last check.  This is likely due to holiday eating.  Please try and make healthy food choices and get regular exercise.  No med changes at this time.  Remainder of labs look good

## 2021-06-18 NOTE — Progress Notes (Deleted)
HPI: FU atrial fibrillation. Cardiac catheterization in January of 2001 showed normal LV function and normal coronary arteries. Nuclear study in June of 2011 showed soft tissue attenuation and no ischemia. Patient has been treated with flecainide for atrial fibrillation. He did have an exercise treadmill in September of 2011 that showed no exercise induced ventricular tachycardia. Echocardiogram repeated March 2022 and showed normal LV function, moderate left ventricular hypertrophy, moderate left atrial enlargement, mild mitral regurgitation.  Found to be in recurrent atrial fibrillation at previous office visit. Patient had successful cardioversion September 10, 2020.  However atrial fibrillation recurred. Since last seen,  Current Outpatient Medications  Medication Sig Dispense Refill   benazepril (LOTENSIN) 20 MG tablet Take 1 tablet (20 mg total) by mouth daily. 90 tablet 3   diltiazem (CARDIZEM CD) 180 MG 24 hr capsule Take 1 capsule (180 mg total) by mouth daily. 90 capsule 3   fluticasone (FLONASE) 50 MCG/ACT nasal spray Place 2 sprays into both nostrils daily. 16 g 5   hydrochlorothiazide (HYDRODIURIL) 12.5 MG tablet TAKE 1 TABLET BY MOUTH  DAILY 90 tablet 3   metFORMIN (GLUCOPHAGE-XR) 500 MG 24 hr tablet TAKE 2 TABLETS BY MOUTH  BEFORE BREAKFAST AND 1  TABLET BY MOUTH WITH SUPPER 270 tablet 3   metoprolol (TOPROL-XL) 100 MG 24 hr tablet Take 1 tablet (100 mg total) by mouth daily. WITH OR IMMEDIATELY  FOLLOWING A MEAL 90 tablet 3   metoprolol tartrate (LOPRESSOR) 100 MG tablet Take 1 tablet (100 mg total) by mouth once for 1 dose. Take 2 hours prior to your Cardiac CT. 1 tablet 0   Multiple Vitamin (MULTIVITAMIN) tablet Take 1 tablet by mouth daily.     Omega-3 Fatty Acids (FISH OIL) 1200 MG CAPS Take 2,400 capsules by mouth 2 (two) times daily.     pravastatin (PRAVACHOL) 40 MG tablet Take 1 tablet (40 mg total) by mouth daily. 90 tablet 3   sildenafil (VIAGRA) 100 MG tablet TAKE 1  TABLET BY MOUTH ONCE DAILY AS NEEDED FOR ERECTILE DYSFUNCTION 10 tablet 6   warfarin (COUMADIN) 10 MG tablet TAKE 1/2 TO 1 TABLET BY  MOUTH DAILY AS DIRECTED BY  COUMADIN CLINIC 90 tablet 0   No current facility-administered medications for this visit.     Past Medical History:  Diagnosis Date   DM II (diabetes mellitus, type II), controlled ([place])    Erectile dysfunction    Hyperlipidemia    Hypertension    Obesity    OSA (obstructive sleep apnea)    Paroxysmal atrial fibrillation [facility])     Past Surgical History:  Procedure Laterality Date   CARDIAC CATHETERIZATION     CARDIOVERSION N/A 09/10/2020   Procedure: CARDIOVERSION;  Surgeon: Lelon Perla, MD;  Location: [facility] ENDOSCOPY;  Service: Cardiovascular;  Laterality: N/A;   FINGER SURGERY     left 1st finger    Social History   Socioeconomic History   Marital status: Married    Spouse name: Not on file   Number of children: Not on file   Years of education: Not on file   Highest education level: Not on file  Occupational History   Not on file  Tobacco Use   Smoking status: Former    Types: Cigarettes    Quit date: 05/26/1994    Years since quitting: 27.0   Smokeless tobacco: Never   Tobacco comments:    quit in 1995  Vaping Use   Vaping Use: Never used  Substance and Sexual Activity   Alcohol use: Yes    Alcohol/week: 5.0 standard drinks    Types: 5 Cans of beer per week   Drug use: No   Sexual activity: Yes  Other Topics Concern   Not on file  Social History Narrative   Not on file   Social Determinants of Health   Financial Resource Strain: Not on file  Food Insecurity: Not on file  Transportation Needs: Not on file  Physical Activity: Not on file  Stress: Not on file  Social Connections: Not on file  Intimate Partner Violence: Not on file    Family History  Problem Relation Age of Onset   Heart attack Paternal Uncle    Heart attack Maternal Grandmother    Heart disease Mother     Hypertension Mother    Diabetes Mother    Alcohol abuse Father    Cancer Neg Hx    Early death Neg Hx    Hyperlipidemia Neg Hx    Kidney disease Neg Hx    Stroke Neg Hx     ROS: no fevers or chills, productive cough, hemoptysis, dysphasia, odynophagia, melena, hematochezia, dysuria, hematuria, rash, seizure activity, orthopnea, PND, pedal edema, claudication. Remaining systems are negative.  Physical Exam: Well-developed well-nourished in no acute distress.  Skin is warm and dry.  HEENT is normal.  Neck is supple.  Chest is clear to auscultation with normal expansion.  Cardiovascular exam is regular rate and rhythm.  Abdominal exam nontender or distended. No masses palpated. Extremities show no edema. neuro grossly intact  ECG- personally reviewed  A/P  1 persistent atrial fibrillation-continue Cardizem and Coumadin.  As outlined above patient failed flecainide.  He is now scheduled for atrial fibrillation ablation (Feb 2023).  2 hypertension-patient's blood pressure is controlled.  Continue present medications.  3 hyperlipidemia-continue statin.  For obstructive sleep apnea-continue CPAP.  Kirk Ruths, MD

## 2021-06-19 LAB — POCT INR: INR: 2 (ref 2.0–3.0)

## 2021-06-20 ENCOUNTER — Ambulatory Visit (INDEPENDENT_AMBULATORY_CARE_PROVIDER_SITE_OTHER): Payer: 59 | Admitting: Cardiology

## 2021-06-20 ENCOUNTER — Other Ambulatory Visit: Payer: Self-pay

## 2021-06-20 ENCOUNTER — Other Ambulatory Visit: Payer: 59

## 2021-06-20 DIAGNOSIS — I4819 Other persistent atrial fibrillation: Secondary | ICD-10-CM

## 2021-06-20 DIAGNOSIS — Z5181 Encounter for therapeutic drug level monitoring: Secondary | ICD-10-CM

## 2021-06-20 DIAGNOSIS — I480 Paroxysmal atrial fibrillation: Secondary | ICD-10-CM

## 2021-06-20 LAB — CBC WITH DIFFERENTIAL/PLATELET
Basophils Absolute: 0 10*3/uL (ref 0.0–0.2)
Basos: 0 %
EOS (ABSOLUTE): 0.2 10*3/uL (ref 0.0–0.4)
Eos: 2 %
Hematocrit: 44.8 % (ref 37.5–51.0)
Hemoglobin: 15.6 g/dL (ref 13.0–17.7)
Lymphocytes Absolute: 2.8 10*3/uL (ref 0.7–3.1)
Lymphs: 25 %
MCH: 29.4 pg (ref 26.6–33.0)
MCHC: 34.8 g/dL (ref 31.5–35.7)
MCV: 84 fL (ref 79–97)
Monocytes Absolute: 1 10*3/uL — ABNORMAL HIGH (ref 0.1–0.9)
Monocytes: 9 %
Neutrophils Absolute: 7.1 10*3/uL — ABNORMAL HIGH (ref 1.4–7.0)
Neutrophils: 64 %
Platelets: 202 10*3/uL (ref 150–450)
RBC: 5.31 x10E6/uL (ref 4.14–5.80)
RDW: 14.3 % (ref 11.6–15.4)
WBC: 11.1 10*3/uL — ABNORMAL HIGH (ref 3.4–10.8)

## 2021-06-21 LAB — BASIC METABOLIC PANEL
BUN/Creatinine Ratio: 19 (ref 9–20)
BUN: 19 mg/dL (ref 6–24)
CO2: 24 mmol/L (ref 20–29)
Calcium: 9.6 mg/dL (ref 8.7–10.2)
Chloride: 101 mmol/L (ref 96–106)
Creatinine, Ser: 1 mg/dL (ref 0.76–1.27)
Glucose: 122 mg/dL — ABNORMAL HIGH (ref 70–99)
Potassium: 4.1 mmol/L (ref 3.5–5.2)
Sodium: 143 mmol/L (ref 134–144)
eGFR: 88 mL/min/{1.73_m2} (ref >59)

## 2021-06-23 ENCOUNTER — Telehealth: Payer: 59 | Admitting: Nurse Practitioner

## 2021-06-23 DIAGNOSIS — B028 Zoster with other complications: Secondary | ICD-10-CM

## 2021-06-23 MED ORDER — GABAPENTIN 600 MG PO TABS: 300.0000 mg | ORAL_TABLET | Freq: Two times a day (BID) | ORAL | 0 refills | Status: DC

## 2021-06-23 MED ORDER — VALACYCLOVIR HCL 1 G PO TABS: 1000.0000 mg | ORAL_TABLET | Freq: Three times a day (TID) | ORAL | 0 refills | Status: AC

## 2021-06-23 NOTE — Patient Instructions (Addendum)
We are sorry that you are not feeling well. Here is how we plan to help!  Based on what you shared with me it looks like you have shingles.  Shingles or herpes zoster, is a common infection of the nerves.  It is a painful rash caused by the herpes zoster virus.  This is the same virus that causes chickenpox.  After a person has chickenpox, the virus remains inactive in the nerve cells.  Years later, the virus can become active again and travel to the skin.  It typically will appear on one side of the face or body.  Burning or shooting pain, tingling, or itching are early signs of the infection.  Blisters typically scab over in 7 to 10 days and clear up within 2-4 weeks. Shingles is only contagious to people that have never had the chickenpox, the chickenpox vaccine, or anyone who has a compromised immune system.  You should avoid contact with these type of people until your blisters scab over.  I have prescribed Valacyclovir 1g three times daily for 7 days and also Gabapentin 300mg  twice daily as needed for pain   HOME CARE: Apply ice packs (wrapped in a thin towel), cool compresses, or soak in cool bath to help reduce pain. Use calamine lotion to calm itchy skin. Avoid scratching the rash. Avoid direct sunlight.  GET HELP RIGHT AWAY IF: Symptoms that dont away after treatment. A rash or blisters near your eye. Increased drainage, fever, or rash after treatment. Severe pain that doesnt go away.   MAKE SURE YOU   Understand these instructions. Will watch your condition. Will get help right away if you are not doing well or get worse.   , thank you for joining [name], NP for today's virtual visit.  While this provider is not your primary care provider (PCP), if your PCP is located in our provider database this encounter information will be shared with them immediately following your visit.  Consent: (Patient) Daniel Grant provided verbal consent for this  virtual visit at the beginning of the encounter.  Current Medications:  Current Outpatient Medications:    gabapentin (NEURONTIN) 600 MG tablet, Take 0.5 tablets (300 mg total) by mouth 2 (two) times daily for 15 days., Disp: 15 tablet, Rfl: 0   valACYclovir (VALTREX) 1000 MG tablet, Take 1 tablet (1,000 mg total) by mouth 3 (three) times daily for 7 days., Disp: 21 tablet, Rfl: 0   benazepril (LOTENSIN) 20 MG tablet, Take 1 tablet (20 mg total) by mouth daily., Disp: 90 tablet, Rfl: 3   diltiazem (CARDIZEM CD) 180 MG 24 hr capsule, Take 1 capsule (180 mg total) by mouth daily., Disp: 90 capsule, Rfl: 3   fluticasone (FLONASE) 50 MCG/ACT nasal spray, Place 2 sprays into both nostrils daily., Disp: 16 g, Rfl: 5   hydrochlorothiazide (HYDRODIURIL) 12.5 MG tablet, TAKE 1 TABLET BY MOUTH  DAILY, Disp: 90 tablet, Rfl: 3   metFORMIN (GLUCOPHAGE-XR) 500 MG 24 hr tablet, TAKE 2 TABLETS BY MOUTH  BEFORE BREAKFAST AND 1  TABLET BY MOUTH WITH SUPPER, Disp: 270 tablet, Rfl: 3   metoprolol (TOPROL-XL) 100 MG 24 hr tablet, Take 1 tablet (100 mg total) by mouth daily. WITH OR IMMEDIATELY  FOLLOWING A MEAL, Disp: 90 tablet, Rfl: 3   metoprolol tartrate (LOPRESSOR) 100 MG tablet, Take 1 tablet (100 mg total) by mouth once for 1 dose. Take 2 hours prior to your Cardiac CT., Disp: 1 tablet, Rfl: 0  Multiple Vitamin (MULTIVITAMIN) tablet, Take 1 tablet by mouth daily., Disp: , Rfl:    Omega-3 Fatty Acids (FISH OIL) 1200 MG CAPS, Take 2,400 capsules by mouth 2 (two) times daily., Disp: , Rfl:    pravastatin (PRAVACHOL) 40 MG tablet, Take 1 tablet (40 mg total) by mouth daily., Disp: 90 tablet, Rfl: 3   sildenafil (VIAGRA) 100 MG tablet, TAKE 1 TABLET BY MOUTH ONCE DAILY AS NEEDED FOR ERECTILE DYSFUNCTION, Disp: 10 tablet, Rfl: 6   warfarin (COUMADIN) 10 MG tablet, TAKE 1/2 TO 1 TABLET BY  MOUTH DAILY AS DIRECTED BY  COUMADIN CLINIC, Disp: 90 tablet, Rfl: 0   Medications ordered in this encounter:  Meds ordered this  encounter  Medications   valACYclovir (VALTREX) 1000 MG tablet    Sig: Take 1 tablet (1,000 mg total) by mouth 3 (three) times daily for 7 days.    Dispense:  21 tablet    Refill:  0    Order Specific Question:   Supervising Provider    Answer:   MILLER, BRIAN [[id number]]   gabapentin (NEURONTIN) 600 MG tablet    Sig: Take 0.5 tablets (300 mg total) by mouth 2 (two) times daily for 15 days.    Dispense:  15 tablet    Refill:  0    Order Specific Question:   Supervising Provider    Answer:   Hyacinth Meeker, BRIAN [[id number]]     *If you need refills on other medications prior to your next appointment, please contact your pharmacy*  Follow-Up: Call back or seek an in-person evaluation if the symptoms worsen or if the condition fails to improve as anticipated.    If you have been instructed to have an in-person evaluation today at a local Urgent Care facility, please use the link below. It will take you to a list of all of our available [facility], including address, phone number and hours of operation. Please do not delay care.  [facility]  If you or a family member do not have a primary care provider, use the link below to schedule a visit and establish care. When you choose a [facility] primary care physician or advanced practice provider, you gain a long-term partner in health. Find a Primary Care Provider  Learn more about [facility]'s in-office and virtual care options: [facility] - Get Care Now

## 2021-06-23 NOTE — Progress Notes (Signed)
Virtual Visit Consent   Daniel Grant, you are scheduled for a virtual visit with a [facility] provider today.     Just as with appointments in the office, your consent must be obtained to participate.  Your consent will be active for this visit and any virtual visit you may have with one of our providers in the next 365 days.     If you have a MyChart account, a copy of this consent can be sent to you electronically.  All virtual visits are billed to your insurance company just like a traditional visit in the office.    As this is a virtual visit, video technology does not allow for your provider to perform a traditional examination.  This may limit your provider's ability to fully assess your condition.  If your provider identifies any concerns that need to be evaluated in person or the need to arrange testing (such as labs, EKG, etc.), we will make arrangements to do so.     Although advances in technology are sophisticated, we cannot ensure that it will always work on either your end or our end.  If the connection with a video visit is poor, the visit may have to be switched to a telephone visit.  With either a video or telephone visit, we are not always able to ensure that we have a secure connection.     I need to obtain your verbal consent now.   Are you willing to proceed with your visit today?    Daniel Grant has provided verbal consent on 06/23/2021 for a virtual visit (video or telephone).   Daniel Rigg, NP   Date: 06/23/2021 2:38 PM   Virtual Visit via Video Note   I, Daniel Grant, connected with  Daniel Grant  ([id number], 1966-04-03) on 06/23/21 at  2:30 PM EST by a video-enabled telemedicine application and verified that I am speaking with the correct person using two identifiers.  Location: Patient: Virtual Visit Location Patient: Home Provider: Virtual Visit Location Provider: Home Office   I discussed the limitations of evaluation and management by  telemedicine and the availability of in person appointments. The patient expressed understanding and agreed to proceed.    History of Present Illness: Daniel Grant is a 56 y.o. who identifies as a male who was assigned male at birth, and is being seen today for SHINGLES OUTBREAK.  Daniel Grant is seen today with shingles out break on abdomen and left flank. Onset of vesicles was within the past 24 hours. Notes intense pain and itching. He has not received the shingrix vaccine in the past.     Problems:  Patient Active Problem List   Diagnosis Date Noted   Chronic anticoagulation 05/13/2019   normal coronary angiography 2001 05/13/2019   OSA (obstructive sleep apnea) 06/16/2016   Hypertriglyceridemia, essential 06/06/2015   Encounter for therapeutic drug monitoring 07/26/2013   Erectile dysfunction 12/10/2011   Routine general medical examination at a health care facility 09/08/2011   Diabetes mellitus type 2, controlled (HCC) 08/29/2008   Hyperlipidemia associated with type 2 diabetes mellitus (HCC) 08/29/2008   Morbid obesity (HCC) 08/29/2008   Essential hypertension 08/29/2008   PAF (paroxysmal atrial fibrillation) (HCC) 08/29/2008    Allergies: No Known Allergies Medications:  Current Outpatient Medications:    gabapentin (NEURONTIN) 600 MG tablet, Take 0.5 tablets (300 mg total) by mouth 2 (two) times daily for 15 days., Disp: 15 tablet, Rfl: 0   valACYclovir (VALTREX)  1000 MG tablet, Take 1 tablet (1,000 mg total) by mouth 3 (three) times daily for 7 days., Disp: 21 tablet, Rfl: 0   benazepril (LOTENSIN) 20 MG tablet, Take 1 tablet (20 mg total) by mouth daily., Disp: 90 tablet, Rfl: 3   diltiazem (CARDIZEM CD) 180 MG 24 hr capsule, Take 1 capsule (180 mg total) by mouth daily., Disp: 90 capsule, Rfl: 3   fluticasone (FLONASE) 50 MCG/ACT nasal spray, Place 2 sprays into both nostrils daily., Disp: 16 g, Rfl: 5   hydrochlorothiazide (HYDRODIURIL) 12.5 MG tablet, TAKE 1 TABLET BY  MOUTH  DAILY, Disp: 90 tablet, Rfl: 3   metFORMIN (GLUCOPHAGE-XR) 500 MG 24 hr tablet, TAKE 2 TABLETS BY MOUTH  BEFORE BREAKFAST AND 1  TABLET BY MOUTH WITH SUPPER, Disp: 270 tablet, Rfl: 3   metoprolol (TOPROL-XL) 100 MG 24 hr tablet, Take 1 tablet (100 mg total) by mouth daily. WITH OR IMMEDIATELY  FOLLOWING A MEAL, Disp: 90 tablet, Rfl: 3   metoprolol tartrate (LOPRESSOR) 100 MG tablet, Take 1 tablet (100 mg total) by mouth once for 1 dose. Take 2 hours prior to your Cardiac CT., Disp: 1 tablet, Rfl: 0   Multiple Vitamin (MULTIVITAMIN) tablet, Take 1 tablet by mouth daily., Disp: , Rfl:    Omega-3 Fatty Acids (FISH OIL) 1200 MG CAPS, Take 2,400 capsules by mouth 2 (two) times daily., Disp: , Rfl:    pravastatin (PRAVACHOL) 40 MG tablet, Take 1 tablet (40 mg total) by mouth daily., Disp: 90 tablet, Rfl: 3   sildenafil (VIAGRA) 100 MG tablet, TAKE 1 TABLET BY MOUTH ONCE DAILY AS NEEDED FOR ERECTILE DYSFUNCTION, Disp: 10 tablet, Rfl: 6   warfarin (COUMADIN) 10 MG tablet, TAKE 1/2 TO 1 TABLET BY  MOUTH DAILY AS DIRECTED BY  COUMADIN CLINIC, Disp: 90 tablet, Rfl: 0  Observations/Objective: Patient is well-developed, well-nourished in no acute distress.  Resting comfortably  at home.  Grant is normocephalic, atraumatic.  No labored breathing.  Speech is clear and coherent with logical content.  Patient is alert and oriented at baseline.  Numerous erythematous areas with centralized small vesicles on left side of abdomen and left flank area.   Assessment and Plan: 1. Herpes zoster with complication - valACYclovir (VALTREX) 1000 MG tablet; Take 1 tablet (1,000 mg total) by mouth 3 (three) times daily for 7 days.  Dispense: 21 tablet; Refill: 0 - gabapentin (NEURONTIN) 600 MG tablet; Take 0.5 tablets (300 mg total) by mouth 2 (two) times daily for 15 days.  Dispense: 15 tablet; Refill: 0   Follow Up Instructions: I discussed the assessment and treatment plan with the patient. The patient was  provided an opportunity to ask questions and all were answered. The patient agreed with the plan and demonstrated an understanding of the instructions.  A copy of instructions were sent to the patient via MyChart unless otherwise noted below.    The patient was advised to call back or seek an in-person evaluation if the symptoms worsen or if the condition fails to improve as anticipated.  Time:  I spent 12 minutes with the patient via telehealth technology discussing the above problems/concerns.    Daniel Rigg, NP

## 2021-06-25 ENCOUNTER — Other Ambulatory Visit: Payer: Self-pay

## 2021-06-25 ENCOUNTER — Ambulatory Visit: Payer: 59 | Admitting: Cardiology

## 2021-06-25 ENCOUNTER — Encounter: Payer: Self-pay | Admitting: Cardiology

## 2021-06-25 VITALS — BP 142/90 | HR 89 | Ht 70.0 in | Wt 276.4 lb

## 2021-06-25 DIAGNOSIS — I4819 Other persistent atrial fibrillation: Secondary | ICD-10-CM | POA: Diagnosis not present

## 2021-06-25 DIAGNOSIS — E1169 Type 2 diabetes mellitus with other specified complication: Secondary | ICD-10-CM | POA: Diagnosis not present

## 2021-06-25 DIAGNOSIS — I10 Essential (primary) hypertension: Secondary | ICD-10-CM

## 2021-06-25 DIAGNOSIS — G4733 Obstructive sleep apnea (adult) (pediatric): Secondary | ICD-10-CM | POA: Diagnosis not present

## 2021-06-25 DIAGNOSIS — E785 Hyperlipidemia, unspecified: Secondary | ICD-10-CM

## 2021-06-25 NOTE — Progress Notes (Addendum)
HPI:FU atrial fibrillation. Cardiac catheterization in January of 2001 showed normal LV function and normal coronary arteries. Nuclear study in June of 2011 showed soft tissue attenuation and no ischemia. Patient had been treated with flecainide for atrial fibrillation. He did have an exercise treadmill in September of 2011 that showed no exercise induced ventricular tachycardia. Echocardiogram repeated March 2022 and showed normal LV function, moderate left ventricular hypertrophy, moderate left atrial enlargement, mild mitral regurgitation.  Found to be in recurrent atrial fibrillation at previous office visit.  Patient had successful cardioversion September 10, 2020.  However atrial fibrillation recurred.  He is now scheduled for atrial fibrillation ablation.  Since last seen, he has dyspnea with more vigorous activities and mild fatigue.  No chest pain, palpitations, syncope or bleeding.  Current Outpatient Medications  Medication Sig Dispense Refill   benazepril (LOTENSIN) 20 MG tablet Take 1 tablet (20 mg total) by mouth daily. 90 tablet 3   fluticasone (FLONASE) 50 MCG/ACT nasal spray Place 2 sprays into both nostrils daily. 16 g 5   gabapentin (NEURONTIN) 600 MG tablet Take 0.5 tablets (300 mg total) by mouth 2 (two) times daily for 15 days. 15 tablet 0   hydrochlorothiazide (HYDRODIURIL) 12.5 MG tablet TAKE 1 TABLET BY MOUTH  DAILY 90 tablet 3   metFORMIN (GLUCOPHAGE-XR) 500 MG 24 hr tablet TAKE 2 TABLETS BY MOUTH  BEFORE BREAKFAST AND 1  TABLET BY MOUTH WITH SUPPER 270 tablet 3   metoprolol (TOPROL-XL) 100 MG 24 hr tablet Take 1 tablet (100 mg total) by mouth daily. WITH OR IMMEDIATELY  FOLLOWING A MEAL 90 tablet 3   Multiple Vitamin (MULTIVITAMIN) tablet Take 1 tablet by mouth daily.     Omega-3 Fatty Acids (FISH OIL) 1200 MG CAPS Take 2,400 capsules by mouth 2 (two) times daily.     pravastatin (PRAVACHOL) 40 MG tablet Take 1 tablet (40 mg total) by mouth daily. 90 tablet 3    sildenafil (VIAGRA) 100 MG tablet TAKE 1 TABLET BY MOUTH ONCE DAILY AS NEEDED FOR ERECTILE DYSFUNCTION 10 tablet 6   valACYclovir (VALTREX) 1000 MG tablet Take 1 tablet (1,000 mg total) by mouth 3 (three) times daily for 7 days. 21 tablet 0   warfarin (COUMADIN) 10 MG tablet TAKE 1/2 TO 1 TABLET BY  MOUTH DAILY AS DIRECTED BY  COUMADIN CLINIC 90 tablet 0   diltiazem (CARDIZEM CD) 180 MG 24 hr capsule Take 1 capsule (180 mg total) by mouth daily. 90 capsule 3   metoprolol tartrate (LOPRESSOR) 100 MG tablet Take 1 tablet (100 mg total) by mouth once for 1 dose. Take 2 hours prior to your Cardiac CT. 1 tablet 0   No current facility-administered medications for this visit.     Past Medical History:  Diagnosis Date   DM II (diabetes mellitus, type II), controlled ([place])    Erectile dysfunction    Hyperlipidemia    Hypertension    Obesity    OSA (obstructive sleep apnea)    Paroxysmal atrial fibrillation [facility])     Past Surgical History:  Procedure Laterality Date   CARDIAC CATHETERIZATION     CARDIOVERSION N/A 09/10/2020   Procedure: CARDIOVERSION;  Surgeon: Lelon Perla, MD;  Location: [facility] ENDOSCOPY;  Service: Cardiovascular;  Laterality: N/A;   FINGER SURGERY     left 1st finger    Social History   Socioeconomic History   Marital status: Married    Spouse name: Not on file   Number of children:  Not on file   Years of education: Not on file   Highest education level: Not on file  Occupational History   Not on file  Tobacco Use   Smoking status: Former    Types: Cigarettes    Quit date: 05/26/1994    Years since quitting: 27.1   Smokeless tobacco: Never   Tobacco comments:    quit in 1995  Vaping Use   Vaping Use: Never used  Substance and Sexual Activity   Alcohol use: Yes    Alcohol/week: 5.0 standard drinks    Types: 5 Cans of beer per week   Drug use: No   Sexual activity: Yes  Other Topics Concern   Not on file  Social History Narrative   Not on file    Social Determinants of Health   Financial Resource Strain: Not on file  Food Insecurity: Not on file  Transportation Needs: Not on file  Physical Activity: Not on file  Stress: Not on file  Social Connections: Not on file  Intimate Partner Violence: Not on file    Family History  Problem Relation Age of Onset   Heart attack Paternal Uncle    Heart attack Maternal Grandmother    Heart disease Mother    Hypertension Mother    Diabetes Mother    Alcohol abuse Father    Cancer Neg Hx    Early death Neg Hx    Hyperlipidemia Neg Hx    Kidney disease Neg Hx    Stroke Neg Hx     ROS: Recent shingles but no fevers or chills, productive cough, hemoptysis, dysphasia, odynophagia, melena, hematochezia, dysuria, hematuria, rash, seizure activity, orthopnea, PND, pedal edema, claudication. Remaining systems are negative.  Physical Exam: Well-developed obese in no acute distress.  Skin is warm and dry.  HEENT is normal.  Neck is supple.  Chest is clear to auscultation with normal expansion.  Cardiovascular exam is irregular Abdominal exam nontender or distended. No masses palpated. Extremities show no edema. neuro grossly intact  A/P  1 persistent atrial fibrillation-plan to continue Cardizem at present dose for rate control.  Continue Coumadin with goal INR 2-3.  Patient failed flecainide.  He is now scheduled for atrial fibrillation ablation in February.  2 hypertension-blood pressure controlled.  Continue present medications and follow.  3 hyperlipidemia-continue statin.  4 obstructive sleep apnea-continue CPAP.  Kirk Ruths, MD

## 2021-06-25 NOTE — Patient Instructions (Signed)
°  Follow-Up: At [facility], you and your health needs are our priority.  As part of our continuing mission to provide you with exceptional heart care, we have created designated Provider Care Teams.  These Care Teams include your primary Cardiologist (physician) and Advanced Practice Providers (APPs -  Physician Assistants and Nurse Practitioners) who all work together to provide you with the care you need, when you need it.  We recommend signing up for the patient portal called "MyChart".  Sign up information is provided on this After Visit Summary.  MyChart is used to connect with patients for Virtual Visits (Telemedicine).  Patients are able to view lab/test results, encounter notes, upcoming appointments, etc.  Non-urgent messages can be sent to your provider as well.   To learn more about what you can do with MyChart, go to [email or web address].    Your next appointment:   4 month(s)  The format for your next appointment:   In Person  Provider:   Olga Millers MD

## 2021-06-26 ENCOUNTER — Telehealth: Payer: Self-pay | Admitting: Cardiology

## 2021-06-26 NOTE — Telephone Encounter (Signed)
Per MD Lalla Brothers cancel procedure and follow up with APP in 3 months to assess how shingles treatment is going and the healing process. Appt made. CT appt, afib follow up and Lalla Brothers follow up canceled at this time.  Patient verbalized understanding.

## 2021-06-26 NOTE — Telephone Encounter (Signed)
Patient calling to see if he needs to reschedule his ablation, because he has shingles.

## 2021-06-27 ENCOUNTER — Encounter: Payer: Self-pay | Admitting: Family Medicine

## 2021-06-27 ENCOUNTER — Telehealth (INDEPENDENT_AMBULATORY_CARE_PROVIDER_SITE_OTHER): Payer: 59 | Admitting: Family Medicine

## 2021-06-27 DIAGNOSIS — B029 Zoster without complications: Secondary | ICD-10-CM | POA: Diagnosis not present

## 2021-06-27 NOTE — Progress Notes (Signed)
Virtual Visit via Video   I connected with patient on 06/27/21 at  2:30 PM EST by a video enabled telemedicine application and verified that I am speaking with the correct person using two identifiers.  Location patient: Home Location provider: [facility] Office Persons participating in the virtual visit: Patient, Provider, B[employer]orne Billings C)  I discussed the limitations of evaluation and management by telemedicine and the availability of in person appointments. The patient expressed understanding and agreed to proceed.  Subjective:   HPI:   Shingles- L side of abd and extending around back.  Sxs started Saturday morning.  Initially thought he was bit by a spider.  Area is painful, burning.  Will have zinging type pain.  Had a virtual visit on Sunday and was given Gabapentin and Valtrex.  Has been out of work this week.  Pt would like to go back next week but is aware he will likely need to stay home.  FMLA rep suggested 2/27 b/c he needs to wear a seatbelt over the area.  ROS:   See pertinent positives and negatives per HPI.  Patient Active Problem List   Diagnosis Date Noted   Chronic anticoagulation 05/13/2019   normal coronary angiography 2001 05/13/2019   OSA (obstructive sleep apnea) 06/16/2016   Hypertriglyceridemia, essential 06/06/2015   Encounter for therapeutic drug monitoring 07/26/2013   Erectile dysfunction 12/10/2011   Routine general medical examination at a health care facility 09/08/2011   Diabetes mellitus type 2, controlled ([place]) 08/29/2008   Hyperlipidemia associated with type 2 diabetes mellitus ([place]) 08/29/2008   Morbid obesity ([place]) 08/29/2008   Essential hypertension 08/29/2008   PAF (paroxysmal atrial fibrillation) ([place]) 08/29/2008    Social History   Tobacco Use   Smoking status: Former    Types: Cigarettes    Quit date: 05/26/1994    Years since quitting: 27.1   Smokeless tobacco: Never   Tobacco comments:    quit in 1995  Substance Use  Topics   Alcohol use: Yes    Alcohol/week: 5.0 standard drinks    Types: 5 Cans of beer per week    Current Outpatient Medications:    benazepril (LOTENSIN) 20 MG tablet, Take 1 tablet (20 mg total) by mouth daily., Disp: 90 tablet, Rfl: 3   fluticasone (FLONASE) 50 MCG/ACT nasal spray, Place 2 sprays into both nostrils daily., Disp: 16 g, Rfl: 5   gabapentin (NEURONTIN) 600 MG tablet, Take 0.5 tablets (300 mg total) by mouth 2 (two) times daily for 15 days., Disp: 15 tablet, Rfl: 0   hydrochlorothiazide (HYDRODIURIL) 12.5 MG tablet, TAKE 1 TABLET BY MOUTH  DAILY, Disp: 90 tablet, Rfl: 3   metFORMIN (GLUCOPHAGE-XR) 500 MG 24 hr tablet, TAKE 2 TABLETS BY MOUTH  BEFORE BREAKFAST AND 1  TABLET BY MOUTH WITH SUPPER, Disp: 270 tablet, Rfl: 3   metoprolol (TOPROL-XL) 100 MG 24 hr tablet, Take 1 tablet (100 mg total) by mouth daily. WITH OR IMMEDIATELY  FOLLOWING A MEAL, Disp: 90 tablet, Rfl: 3   Multiple Vitamin (MULTIVITAMIN) tablet, Take 1 tablet by mouth daily., Disp: , Rfl:    Omega-3 Fatty Acids (FISH OIL) 1200 MG CAPS, Take 2,400 capsules by mouth 2 (two) times daily., Disp: , Rfl:    pravastatin (PRAVACHOL) 40 MG tablet, Take 1 tablet (40 mg total) by mouth daily., Disp: 90 tablet, Rfl: 3   sildenafil (VIAGRA) 100 MG tablet, TAKE 1 TABLET BY MOUTH ONCE DAILY AS NEEDED FOR ERECTILE DYSFUNCTION, Disp: 10 tablet, Rfl: 6  valACYclovir (VALTREX) 1000 MG tablet, Take 1 tablet (1,000 mg total) by mouth 3 (three) times daily for 7 days., Disp: 21 tablet, Rfl: 0   warfarin (COUMADIN) 10 MG tablet, TAKE 1/2 TO 1 TABLET BY  MOUTH DAILY AS DIRECTED BY  COUMADIN CLINIC, Disp: 90 tablet, Rfl: 0   diltiazem (CARDIZEM CD) 180 MG 24 hr capsule, Take 1 capsule (180 mg total) by mouth daily., Disp: 90 capsule, Rfl: 3   metoprolol tartrate (LOPRESSOR) 100 MG tablet, Take 1 tablet (100 mg total) by mouth once for 1 dose. Take 2 hours prior to your Cardiac CT., Disp: 1 tablet, Rfl: 0  No Known  Allergies  Objective:   There were no vitals taken for this visit. AAOx3, NAD NCAT, EOMI No obvious CN deficits Coloring WNL Vesicles along L side of abdomen Pt is able to speak clearly, coherently without shortness of breath or increased work of breathing.  Thought process is linear.  Mood is appropriate.   Assessment and Plan:   Shingles- new.  Dx'd 1/29 and started on Valtrex and Gabapentin.  Now needs FMLA forms completed bc he is not able to wear a seat belt due to location of vesicles across abdomen.  Forms completed on his behalf w/ latest return to work date 2/27, but pt hopes to return earlier.   Annye Asa, MD 06/27/2021

## 2021-06-28 ENCOUNTER — Ambulatory Visit: Payer: 59 | Admitting: Cardiology

## 2021-06-28 NOTE — Telephone Encounter (Signed)
Pt reports another form needs completion I will print and place in your file with charge sheet

## 2021-07-04 ENCOUNTER — Ambulatory Visit (HOSPITAL_COMMUNITY): Payer: 59

## 2021-07-04 ENCOUNTER — Telehealth: Payer: Self-pay

## 2021-07-04 LAB — POCT INR: INR: 2.5 (ref 2.0–3.0)

## 2021-07-04 NOTE — Telephone Encounter (Signed)
I spoke to the pt's wife and reminded her to check INR.  She verbalized understanding.

## 2021-07-05 ENCOUNTER — Ambulatory Visit (INDEPENDENT_AMBULATORY_CARE_PROVIDER_SITE_OTHER): Payer: 59 | Admitting: Internal Medicine

## 2021-07-05 ENCOUNTER — Encounter: Payer: Self-pay | Admitting: Family Medicine

## 2021-07-05 DIAGNOSIS — Z5181 Encounter for therapeutic drug level monitoring: Secondary | ICD-10-CM | POA: Diagnosis not present

## 2021-07-05 DIAGNOSIS — I480 Paroxysmal atrial fibrillation: Secondary | ICD-10-CM

## 2021-07-11 ENCOUNTER — Encounter (HOSPITAL_COMMUNITY): Payer: 59

## 2021-07-11 ENCOUNTER — Ambulatory Visit (HOSPITAL_COMMUNITY): Admit: 2021-07-11 | Payer: 59 | Admitting: Cardiology

## 2021-07-11 SURGERY — ATRIAL FIBRILLATION ABLATION
Anesthesia: General

## 2021-07-18 LAB — POCT INR: INR: 2 (ref 2.0–3.0)

## 2021-07-19 ENCOUNTER — Ambulatory Visit (INDEPENDENT_AMBULATORY_CARE_PROVIDER_SITE_OTHER): Payer: 59 | Admitting: Cardiology

## 2021-07-19 DIAGNOSIS — I480 Paroxysmal atrial fibrillation: Secondary | ICD-10-CM

## 2021-07-19 DIAGNOSIS — Z5181 Encounter for therapeutic drug level monitoring: Secondary | ICD-10-CM

## 2021-08-08 ENCOUNTER — Ambulatory Visit (HOSPITAL_COMMUNITY): Payer: 59 | Admitting: Nurse Practitioner

## 2021-08-14 ENCOUNTER — Ambulatory Visit (INDEPENDENT_AMBULATORY_CARE_PROVIDER_SITE_OTHER): Payer: 59 | Admitting: Cardiovascular Disease

## 2021-08-14 DIAGNOSIS — I480 Paroxysmal atrial fibrillation: Secondary | ICD-10-CM | POA: Diagnosis not present

## 2021-08-14 DIAGNOSIS — Z5181 Encounter for therapeutic drug level monitoring: Secondary | ICD-10-CM

## 2021-08-14 LAB — POCT INR: INR: 3.5 — AB (ref 2.0–3.0)

## 2021-08-20 ENCOUNTER — Other Ambulatory Visit: Payer: Self-pay | Admitting: Cardiology

## 2021-08-20 NOTE — Telephone Encounter (Signed)
Prescription refill request received for warfarin ?Daniel Grant, 06/25/2021 ?Next INR check: 4/5 ?Warfarin tablet strength: 10mg   ?

## 2021-08-28 LAB — POCT INR: INR: 2.4 (ref 2.0–3.0)

## 2021-08-29 ENCOUNTER — Ambulatory Visit (INDEPENDENT_AMBULATORY_CARE_PROVIDER_SITE_OTHER): Payer: 59 | Admitting: Internal Medicine

## 2021-08-29 DIAGNOSIS — Z5181 Encounter for therapeutic drug level monitoring: Secondary | ICD-10-CM

## 2021-08-29 DIAGNOSIS — I480 Paroxysmal atrial fibrillation: Secondary | ICD-10-CM

## 2021-09-18 NOTE — Progress Notes (Deleted)
Cardiology Office Note Date:  09/18/2021  Patient ID:  Daniel Grant, Daniel Grant Dec 19, 1965, MRN [id number] PCP:  Sheliah Hatch, MD  Cardiologist:  Dr. Jens Som Electrophysiologist: Dr. Lalla Brothers  ***refresh   Chief Complaint: *** planned f/u  History of Present Illness: Daniel Grant is a 56 y.o. male with history of DM, HTN, HLD, obesity, OSA w/CPAP, and Afib.  He comes in today to be seen for Dr. Lalla Brothers, last seen by him Nov 2022, planned for AFib ablation, eventually scheduled for Feb 2023, though cancelled 2/2 a shingles infection/eruption with recommendation to see EP APP in a few months to revisit AF burden, symptoms, plans.  *** shingles? *** AF symptoms, burden *** warfarin, bleeding   AFib/AAD hx Diagnosis goes back at least as far as 2011 Flecainide 2011 stopped Oct 2022 with recurrent AFib   Past Medical History:  Diagnosis Date   DM II (diabetes mellitus, type II), controlled ([facility])    Erectile dysfunction    Hyperlipidemia    Hypertension    Obesity    OSA (obstructive sleep apnea)    Paroxysmal atrial fibrillation ([facility])     Past Surgical History:  Procedure Laterality Date   CARDIAC CATHETERIZATION     CARDIOVERSION N/A 09/10/2020   Procedure: CARDIOVERSION;  Surgeon: Lewayne Bunting, MD;  Location: [facility];  Service: Cardiovascular;  Laterality: N/A;   FINGER SURGERY     left 1st finger    Current Outpatient Medications  Medication Sig Dispense Refill   benazepril (LOTENSIN) 20 MG tablet Take 1 tablet (20 mg total) by mouth daily. 90 tablet 3   diltiazem (CARDIZEM CD) 180 MG 24 hr capsule Take 1 capsule (180 mg total) by mouth daily. 90 capsule 3   fluticasone (FLONASE) 50 MCG/ACT nasal spray Place 2 sprays into both nostrils daily. 16 g 5   gabapentin (NEURONTIN) 600 MG tablet Take 0.5 tablets (300 mg total) by mouth 2 (two) times daily for 15 days. 15 tablet 0   hydrochlorothiazide (HYDRODIURIL) 12.5 MG tablet TAKE 1 TABLET BY MOUTH  DAILY  90 tablet 3   metFORMIN (GLUCOPHAGE-XR) 500 MG 24 hr tablet TAKE 2 TABLETS BY MOUTH  BEFORE BREAKFAST AND 1  TABLET BY MOUTH WITH SUPPER 270 tablet 3   metoprolol (TOPROL-XL) 100 MG 24 hr tablet Take 1 tablet (100 mg total) by mouth daily. WITH OR IMMEDIATELY  FOLLOWING A MEAL 90 tablet 3   metoprolol tartrate (LOPRESSOR) 100 MG tablet Take 1 tablet (100 mg total) by mouth once for 1 dose. Take 2 hours prior to your Cardiac CT. 1 tablet 0   Multiple Vitamin (MULTIVITAMIN) tablet Take 1 tablet by mouth daily.     Omega-3 Fatty Acids (FISH OIL) 1200 MG CAPS Take 2,400 capsules by mouth 2 (two) times daily.     pravastatin (PRAVACHOL) 40 MG tablet Take 1 tablet (40 mg total) by mouth daily. 90 tablet 3   sildenafil (VIAGRA) 100 MG tablet TAKE 1 TABLET BY MOUTH ONCE DAILY AS NEEDED FOR ERECTILE DYSFUNCTION 10 tablet 6   warfarin (COUMADIN) 10 MG tablet TAKE 1/2 TO 1 TABLET BY  MOUTH DAILY AS DIRECTED BY  COUMADIN CLINIC 75 tablet 0   No current facility-administered medications for this visit.    Allergies:   Patient has no known allergies.   Social History:  The patient  reports that he quit smoking about 27 years ago. His smoking use included cigarettes. He has never used smokeless tobacco. He reports current alcohol use  of about 5.0 standard drinks per week. He reports that he does not use drugs.   Family History:  The patient's family history includes Alcohol abuse in his father; Diabetes in his mother; Heart attack in his maternal grandmother and paternal uncle; Heart disease in his mother; Hypertension in his mother.  ROS:  Please see the history of present illness.    All other systems are reviewed and otherwise negative.   PHYSICAL EXAM:  VS:  There were no vitals taken for this visit. BMI: There is no height or weight on file to calculate BMI. Well nourished, well developed, in no acute distress HEENT: normocephalic, atraumatic Neck: no JVD, carotid bruits or masses Cardiac:  *** RRR;  no significant murmurs, no rubs, or gallops Lungs:  *** CTA b/l, no wheezing, rhonchi or rales Abd: soft, nontender MS: no deformity or *** atrophy Ext: *** no edema Skin: warm and dry, no rash Neuro:  No gross deficits appreciated Psych: euthymic mood, full affect    EKG:  Done today and reviewed by myself shows  ***   08/09/20: TTE 1. Pt in atrial fibrillation during the study.   2. Left ventricular ejection fraction, by estimation, is 55 to 60%. The  left ventricle has normal function. The left ventricle has no regional  wall motion abnormalities. There is moderate left ventricular hypertrophy.  Left ventricular diastolic function   could not be evaluated.   3. Right ventricular systolic function is normal. The right ventricular  size is normal. Tricuspid regurgitation signal is inadequate for assessing  PA pressure.   4. Left atrial size was moderately dilated.   5. The mitral valve is normal in structure. Mild mitral valve  regurgitation. No evidence of mitral stenosis.   6. The aortic valve is tricuspid. Aortic valve regurgitation is not  visualized. No aortic stenosis is present.   7. The inferior vena cava is normal in size with greater than 50%  respiratory variability, suggesting right atrial pressure of 3 mmHg.   Recent Labs: 06/07/2021: ALT 24; TSH 0.55 06/20/2021: BUN 19; Creatinine, Ser 1.00; Hemoglobin 15.6; Platelets 202; Potassium 4.1; Sodium 143  10/16/2020: VLDL 31.6 06/07/2021: Cholesterol 127; HDL 33; LDL Cholesterol (Calc) 64; Total CHOL/HDL Ratio 3.8; Triglycerides 254   CrCl cannot be calculated (Patient's most recent lab result is older than the maximum 21 days allowed.).   Wt Readings from Last 3 Encounters:  06/25/21 276 lb 6.4 oz (125.4 kg)  06/07/21 275 lb 6.4 oz (124.9 kg)  03/29/21 267 lb (121.1 kg)     Other studies reviewed: Additional studies/records reviewed today include: summarized above  ASSESSMENT AND PLAN:  Persistent  AFib CHA2DS2Vasc is 2, on warfarin ***  HTN ***  OSA ***  Disposition: F/u with ***  Current medicines are reviewed at length with the patient today.  The patient did not have any concerns regarding medicines.  Norma Fredrickson, PA-C 09/18/2021 11:56 AM     [facility] [address] [place] [address] [phone] (office)  [phone] (fax)

## 2021-09-23 ENCOUNTER — Ambulatory Visit: Payer: 59 | Admitting: Physician Assistant

## 2021-09-23 ENCOUNTER — Telehealth: Payer: Self-pay | Admitting: Cardiology

## 2021-09-23 ENCOUNTER — Encounter: Payer: Self-pay | Admitting: Cardiology

## 2021-09-23 DIAGNOSIS — Z01818 Encounter for other preprocedural examination: Secondary | ICD-10-CM

## 2021-09-23 DIAGNOSIS — I4891 Unspecified atrial fibrillation: Secondary | ICD-10-CM

## 2021-09-23 NOTE — Telephone Encounter (Signed)
Please see mychart message with patient. ?

## 2021-09-23 NOTE — Telephone Encounter (Signed)
Patient's wife called about his appt today, that never happened.  She stated he no longer has shingles.  His ablation needs to be rescheduled.  ?

## 2021-09-25 LAB — POCT INR: INR: 1.9 — AB (ref 2.0–3.0)

## 2021-09-26 ENCOUNTER — Ambulatory Visit (INDEPENDENT_AMBULATORY_CARE_PROVIDER_SITE_OTHER): Payer: 59 | Admitting: Cardiovascular Disease

## 2021-09-26 DIAGNOSIS — Z5181 Encounter for therapeutic drug level monitoring: Secondary | ICD-10-CM

## 2021-09-26 DIAGNOSIS — I480 Paroxysmal atrial fibrillation: Secondary | ICD-10-CM | POA: Diagnosis not present

## 2021-10-04 ENCOUNTER — Encounter: Payer: Self-pay | Admitting: Family Medicine

## 2021-10-04 ENCOUNTER — Ambulatory Visit: Payer: 59 | Admitting: Family Medicine

## 2021-10-04 ENCOUNTER — Other Ambulatory Visit: Payer: Self-pay

## 2021-10-04 VITALS — BP 132/84 | HR 82 | Temp 97.8°F | Resp 16 | Ht 70.0 in | Wt 269.0 lb

## 2021-10-04 DIAGNOSIS — E119 Type 2 diabetes mellitus without complications: Secondary | ICD-10-CM

## 2021-10-04 DIAGNOSIS — Z23 Encounter for immunization: Secondary | ICD-10-CM

## 2021-10-04 MED ORDER — METFORMIN HCL ER 500 MG PO TB24: ORAL_TABLET | ORAL | 3 refills | Status: DC

## 2021-10-04 NOTE — Patient Instructions (Signed)
Schedule your complete physical in 3-4 months ?We'll notify you of your lab results and make any changes if needed ?Continue to work on a low sugar diet and regular exercise ?You are due for your eye exam later this month- make sure this is scheduled ?Call with any questions or concerns ?Stay Safe!  Stay Healthy! ?Have a great summer!!! ?

## 2021-10-04 NOTE — Progress Notes (Signed)
   Subjective:    Patient ID: Daniel Grant, male    DOB: [birth year], 56 y.o.   MRN: [id number]  HPI DM- chronic problem, on Metformin XR 500mg   1000mg  QAM and 500mg  QPM.  On ACE for renal protection.  UTD on eye exam.  Due for foot exam.  Denies CP, HAs, visual changes, abd pain, N/V.  Some SOB due to Afib  Obesity- pt is down 7 lbs since last visit.  Pt is more active now that the weather is nicer  Pt desires shingles shot   Review of Systems For ROS see HPI     Objective:   Physical Exam Vitals reviewed.  Constitutional:      General: He is not in acute distress.    Appearance: Normal appearance. He is well-developed. He is obese. He is not ill-appearing.  HENT:     Head: Normocephalic and atraumatic.  Eyes:     Extraocular Movements: Extraocular movements intact.     Conjunctiva/sclera: Conjunctivae normal.     Pupils: Pupils are equal, round, and reactive to light.  Neck:     Thyroid: No thyromegaly.  Cardiovascular:     Rate and Rhythm: Normal rate. Rhythm irregular.     Pulses: Normal pulses.     Heart sounds: Normal heart sounds. No murmur heard. Pulmonary:     Effort: Pulmonary effort is normal. No respiratory distress.     Breath sounds: Normal breath sounds.  Abdominal:     General: Bowel sounds are normal. There is no distension.     Palpations: Abdomen is soft.  Musculoskeletal:     Cervical back: Normal range of motion and neck supple.     Right lower leg: No edema.     Left lower leg: No edema.  Lymphadenopathy:     Cervical: No cervical adenopathy.  Skin:    General: Skin is warm and dry.  Neurological:     General: No focal deficit present.     Mental Status: He is alert and oriented to person, place, and time.     Cranial Nerves: No cranial nerve deficit.  Psychiatric:        Mood and Affect: Mood normal.        Behavior: Behavior normal.          Assessment & Plan:

## 2021-10-05 LAB — BASIC METABOLIC PANEL
BUN: 19 mg/dL (ref 7–25)
CO2: 23 mmol/L (ref 20–32)
Calcium: 9.4 mg/dL (ref 8.6–10.3)
Chloride: 108 mmol/L (ref 98–110)
Creat: 0.93 mg/dL (ref 0.70–1.30)
Glucose, Bld: 105 mg/dL — ABNORMAL HIGH (ref 65–99)
Potassium: 4.1 mmol/L (ref 3.5–5.3)
Sodium: 141 mmol/L (ref 135–146)

## 2021-10-05 LAB — HEMOGLOBIN A1C
Hgb A1c MFr Bld: 7.7 %{Hb} — ABNORMAL HIGH
Mean Plasma Glucose: 174 mg/dL
eAG (mmol/L): 9.7 mmol/L

## 2021-10-07 ENCOUNTER — Telehealth: Payer: Self-pay

## 2021-10-07 NOTE — Telephone Encounter (Signed)
Patient returned call

## 2021-10-07 NOTE — Telephone Encounter (Signed)
Spoke w/ pt and informed him of his lab results  ?

## 2021-10-07 NOTE — Telephone Encounter (Signed)
-----   Message from Sheliah Hatch, MD sent at 10/07/2021  7:35 AM EDT ----- ?A1C is stable at 7.7%.  Please try and work on low carb/low sugar diet to improve this number ?

## 2021-10-08 ENCOUNTER — Ambulatory Visit: Payer: 59 | Admitting: Cardiology

## 2021-10-09 LAB — POCT INR: INR: 3.9 — AB (ref 2.0–3.0)

## 2021-10-10 ENCOUNTER — Ambulatory Visit (INDEPENDENT_AMBULATORY_CARE_PROVIDER_SITE_OTHER): Payer: 59 | Admitting: Internal Medicine

## 2021-10-10 ENCOUNTER — Telehealth: Payer: Self-pay

## 2021-10-10 DIAGNOSIS — I480 Paroxysmal atrial fibrillation: Secondary | ICD-10-CM | POA: Diagnosis not present

## 2021-10-10 DIAGNOSIS — Z5181 Encounter for therapeutic drug level monitoring: Secondary | ICD-10-CM | POA: Diagnosis not present

## 2021-10-10 NOTE — Telephone Encounter (Signed)
Lp's wife message reminding her to check INR

## 2021-10-16 LAB — POCT INR: INR: 2.3 (ref 2.0–3.0)

## 2021-10-18 ENCOUNTER — Ambulatory Visit (INDEPENDENT_AMBULATORY_CARE_PROVIDER_SITE_OTHER): Payer: 59 | Admitting: Cardiology

## 2021-10-18 DIAGNOSIS — I480 Paroxysmal atrial fibrillation: Secondary | ICD-10-CM

## 2021-10-18 DIAGNOSIS — Z5181 Encounter for therapeutic drug level monitoring: Secondary | ICD-10-CM | POA: Diagnosis not present

## 2021-10-21 ENCOUNTER — Other Ambulatory Visit: Payer: Self-pay | Admitting: Cardiology

## 2021-10-21 DIAGNOSIS — I480 Paroxysmal atrial fibrillation: Secondary | ICD-10-CM

## 2021-10-21 NOTE — Assessment & Plan Note (Signed)
Pt is down 7 lbs since last visit.  Reports he is more active since the weather is warmer.  Encouraged low carb diet and regular physical activity.  Will follow.

## 2021-10-21 NOTE — Assessment & Plan Note (Signed)
Chronic problem.  Tolerating Metformin w/o difficulty.  Taking 1000mg  QAM and 500mg  QPM.  UTD on eye exam, foot exam done today.  On ACE for renal protection.  Check labs.  Adjust meds prn

## 2021-10-22 ENCOUNTER — Ambulatory Visit: Payer: 59 | Admitting: Cardiology

## 2021-10-23 ENCOUNTER — Other Ambulatory Visit: Payer: Self-pay

## 2021-10-23 DIAGNOSIS — I480 Paroxysmal atrial fibrillation: Secondary | ICD-10-CM

## 2021-10-23 LAB — HM DIABETES EYE EXAM

## 2021-10-23 MED ORDER — BENAZEPRIL HCL 20 MG PO TABS: 20.0000 mg | ORAL_TABLET | Freq: Every day | ORAL | 0 refills | Status: DC

## 2021-10-24 ENCOUNTER — Other Ambulatory Visit: Payer: Self-pay | Admitting: Pharmacist

## 2021-10-24 ENCOUNTER — Other Ambulatory Visit: Payer: Self-pay | Admitting: Cardiology

## 2021-10-24 DIAGNOSIS — I480 Paroxysmal atrial fibrillation: Secondary | ICD-10-CM

## 2021-10-24 MED ORDER — WARFARIN SODIUM 10 MG PO TABS: ORAL_TABLET | ORAL | 1 refills | Status: DC

## 2021-10-24 NOTE — Telephone Encounter (Signed)
Refills sent 10/24/2021.

## 2021-10-28 ENCOUNTER — Telehealth: Payer: Self-pay | Admitting: *Deleted

## 2021-10-28 NOTE — Telephone Encounter (Signed)
Lm to call back ./cy 

## 2021-10-29 ENCOUNTER — Encounter: Payer: Self-pay | Admitting: *Deleted

## 2021-10-29 MED ORDER — METOPROLOL TARTRATE 25 MG PO TABS: 25.0000 mg | ORAL_TABLET | Freq: Once | ORAL | 0 refills | Status: DC | PRN

## 2021-10-29 NOTE — Telephone Encounter (Signed)
Spoke to the patient's wife (DPR) when over instructions for CT scan and Ablation.  Verbalized understanding and agreement.

## 2021-10-30 LAB — POCT INR: INR: 2.3 (ref 2.0–3.0)

## 2021-10-31 ENCOUNTER — Ambulatory Visit (INDEPENDENT_AMBULATORY_CARE_PROVIDER_SITE_OTHER): Payer: 59 | Admitting: Cardiovascular Disease

## 2021-10-31 DIAGNOSIS — I480 Paroxysmal atrial fibrillation: Secondary | ICD-10-CM

## 2021-10-31 DIAGNOSIS — Z5181 Encounter for therapeutic drug level monitoring: Secondary | ICD-10-CM

## 2021-11-27 LAB — POCT INR: INR: 2.6 (ref 2.0–3.0)

## 2021-11-28 ENCOUNTER — Ambulatory Visit (INDEPENDENT_AMBULATORY_CARE_PROVIDER_SITE_OTHER): Payer: 59 | Admitting: Cardiology

## 2021-11-28 DIAGNOSIS — I480 Paroxysmal atrial fibrillation: Secondary | ICD-10-CM

## 2021-11-28 DIAGNOSIS — Z5181 Encounter for therapeutic drug level monitoring: Secondary | ICD-10-CM | POA: Diagnosis not present

## 2021-12-03 ENCOUNTER — Telehealth: Payer: Self-pay | Admitting: *Deleted

## 2021-12-03 NOTE — Telephone Encounter (Signed)
Wife called and stated that the pt started Amoxocillin four times a day for a bad tooth that will need extracted. Advised that the amoxocilin is safe to take with warfarin. He will be starting weekly INR's for upcoming ablationand wife states pt may have to hold warfarin for extraction. Advised will await oral  surgeon to send clearance form.

## 2021-12-05 ENCOUNTER — Ambulatory Visit (INDEPENDENT_AMBULATORY_CARE_PROVIDER_SITE_OTHER): Payer: 59 | Admitting: *Deleted

## 2021-12-05 DIAGNOSIS — I480 Paroxysmal atrial fibrillation: Secondary | ICD-10-CM | POA: Diagnosis not present

## 2021-12-05 DIAGNOSIS — Z5181 Encounter for therapeutic drug level monitoring: Secondary | ICD-10-CM | POA: Diagnosis not present

## 2021-12-05 LAB — POCT INR: INR: 2.6 (ref 2.0–3.0)

## 2021-12-05 NOTE — Patient Instructions (Signed)
Description   Called and spoke to pt's wife and instructed pt to continue taking warfarin 1/2 a tablet daily except for 1 tablet on Mondays and Fridays. Recheck INR in 1 week. - Ablation 01/02/2022   *Please discuss DOAc option with spouse during next INR check - note on file to discuss after insurance change*  Holding off for now.      

## 2021-12-09 ENCOUNTER — Encounter: Payer: Self-pay | Admitting: Family Medicine

## 2021-12-09 ENCOUNTER — Other Ambulatory Visit: Payer: 59

## 2021-12-09 DIAGNOSIS — I4891 Unspecified atrial fibrillation: Secondary | ICD-10-CM

## 2021-12-09 DIAGNOSIS — Z01818 Encounter for other preprocedural examination: Secondary | ICD-10-CM

## 2021-12-09 LAB — CBC WITH DIFFERENTIAL/PLATELET
Basophils Absolute: 0.1 10*3/uL (ref 0.0–0.2)
Basos: 1 %
EOS (ABSOLUTE): 0.2 10*3/uL (ref 0.0–0.4)
Eos: 2 %
Hematocrit: 45.7 % (ref 37.5–51.0)
Hemoglobin: 15.7 g/dL (ref 13.0–17.7)
Lymphocytes Absolute: 3 10*3/uL (ref 0.7–3.1)
Lymphs: 29 %
MCH: 28.8 pg (ref 26.6–33.0)
MCHC: 34.4 g/dL (ref 31.5–35.7)
MCV: 84 fL (ref 79–97)
Monocytes Absolute: 0.9 10*3/uL (ref 0.1–0.9)
Monocytes: 8 %
Neutrophils Absolute: 6.3 10*3/uL (ref 1.4–7.0)
Neutrophils: 60 %
Platelets: 219 10*3/uL (ref 150–450)
RBC: 5.45 x10E6/uL (ref 4.14–5.80)
RDW: 14.7 % (ref 11.6–15.4)
WBC: 10.5 10*3/uL (ref 3.4–10.8)

## 2021-12-09 LAB — BASIC METABOLIC PANEL
BUN/Creatinine Ratio: 29 — ABNORMAL HIGH (ref 9–20)
BUN: 26 mg/dL — ABNORMAL HIGH (ref 6–24)
CO2: 25 mmol/L (ref 20–29)
Calcium: 9.4 mg/dL (ref 8.7–10.2)
Chloride: 103 mmol/L (ref 96–106)
Creatinine, Ser: 0.89 mg/dL (ref 0.76–1.27)
Glucose: 125 mg/dL — ABNORMAL HIGH (ref 70–99)
Potassium: 4.2 mmol/L (ref 3.5–5.2)
Sodium: 136 mmol/L (ref 134–144)
eGFR: 99 mL/min/{1.73_m2} (ref >59)

## 2021-12-09 LAB — PROTIME-INR
INR: 2.4 — ABNORMAL HIGH (ref 0.9–1.2)
Prothrombin Time: 23.4 s — ABNORMAL HIGH (ref 9.1–12.0)

## 2021-12-10 ENCOUNTER — Ambulatory Visit (INDEPENDENT_AMBULATORY_CARE_PROVIDER_SITE_OTHER): Payer: 59 | Admitting: Cardiology

## 2021-12-10 ENCOUNTER — Encounter: Payer: Self-pay | Admitting: Cardiology

## 2021-12-10 DIAGNOSIS — I480 Paroxysmal atrial fibrillation: Secondary | ICD-10-CM

## 2021-12-10 DIAGNOSIS — Z5181 Encounter for therapeutic drug level monitoring: Secondary | ICD-10-CM

## 2021-12-10 NOTE — Progress Notes (Signed)
This encounter was created in error - please disregard.

## 2021-12-12 ENCOUNTER — Other Ambulatory Visit: Payer: Self-pay | Admitting: Cardiology

## 2021-12-12 DIAGNOSIS — I480 Paroxysmal atrial fibrillation: Secondary | ICD-10-CM

## 2021-12-18 ENCOUNTER — Ambulatory Visit (INDEPENDENT_AMBULATORY_CARE_PROVIDER_SITE_OTHER): Payer: 59

## 2021-12-18 DIAGNOSIS — I480 Paroxysmal atrial fibrillation: Secondary | ICD-10-CM | POA: Diagnosis not present

## 2021-12-18 DIAGNOSIS — Z7901 Long term (current) use of anticoagulants: Secondary | ICD-10-CM

## 2021-12-18 LAB — POCT INR: INR: 2.3 (ref 2.0–3.0)

## 2021-12-18 NOTE — Patient Instructions (Signed)
Description   Called and spoke to pt's wife and instructed pt to continue taking warfarin 1/2 a tablet daily except for 1 tablet on Mondays and Fridays. Recheck INR in 1 week. - Ablation 01/02/2022   *Please discuss DOAc option with spouse during next INR check - note on file to discuss after insurance change*  Holding off for now.

## 2021-12-25 ENCOUNTER — Telehealth (HOSPITAL_COMMUNITY): Payer: Self-pay | Admitting: *Deleted

## 2021-12-25 ENCOUNTER — Ambulatory Visit (INDEPENDENT_AMBULATORY_CARE_PROVIDER_SITE_OTHER): Payer: 59

## 2021-12-25 ENCOUNTER — Telehealth: Payer: Self-pay

## 2021-12-25 DIAGNOSIS — I480 Paroxysmal atrial fibrillation: Secondary | ICD-10-CM

## 2021-12-25 DIAGNOSIS — Z5181 Encounter for therapeutic drug level monitoring: Secondary | ICD-10-CM | POA: Diagnosis not present

## 2021-12-25 LAB — POCT INR: INR: 1.9 — AB (ref 2.0–3.0)

## 2021-12-25 NOTE — Telephone Encounter (Signed)
POC INR 1.9 - Following message sent to Dr Lalla Brothers and Inetta Fermo, RN   Memory Dance, RN  Sampson Goon, RN; Lanier Prude, MD Hi,  Daniel Grant.. Daniel Grant is scheduled for Afib Ablation on 01/02/22.We have been checking INR weekly and his INR today was 1.9 (Goal 2-3).   Wanted to make you aware since the procedure is pending for next week.   Thanks,  Cammy Copa, Charity fundraiser

## 2021-12-25 NOTE — Telephone Encounter (Signed)
Patient's wife returning call regarding upcoming cardiac imaging study; pt's wife verbalizes understanding of appt date/time, parking situation and where to check in, pre-test NPO status and medications ordered, and verified current allergies; name and call back number provided for further questions should they arise  Larey Brick RN Navigator Cardiac Imaging [facility] [phone] office [phone] cell  They are aware he is to arrive at 1pm and take 25mg  metoprolol tartrate two hours prior to his cardiac CT scan if his HR is greater than 80bpm.

## 2021-12-25 NOTE — Telephone Encounter (Signed)
Attempted to call patient regarding upcoming cardiac CT appointment. °Left message on voicemail with name and callback number ° °Croy Drumwright RN Navigator Cardiac Imaging °[facility] °[phone] Office °[phone] Cell ° °

## 2021-12-25 NOTE — Patient Instructions (Signed)
Description   Called and spoke to pt's wife and instructed pt to take 1 tablet today and then continue taking warfarin 1/2 a tablet daily except for 1 tablet on Mondays and Fridays. Recheck INR in 1 week. - Ablation 01/02/2022   *Please discuss DOAc option with spouse during next INR check - note on file to discuss after insurance change*  Holding off for now.

## 2021-12-26 ENCOUNTER — Ambulatory Visit (HOSPITAL_COMMUNITY)
Admission: RE | Admit: 2021-12-26 | Discharge: 2021-12-26 | Disposition: A | Discharge status: Home / Self Care | Payer: 59 | Source: Ambulatory Visit | Attending: Cardiology | Admitting: Cardiology

## 2021-12-26 ENCOUNTER — Other Ambulatory Visit (HOSPITAL_COMMUNITY): Payer: 59

## 2021-12-26 DIAGNOSIS — I4891 Unspecified atrial fibrillation: Secondary | ICD-10-CM | POA: Insufficient documentation

## 2021-12-26 DIAGNOSIS — I4819 Other persistent atrial fibrillation: Secondary | ICD-10-CM | POA: Diagnosis not present

## 2021-12-26 MED ORDER — IOHEXOL 350 MG/ML SOLN
100.0000 mL | Freq: Once | INTRAVENOUS | Status: AC | PRN
  Administered 2021-12-26: 100 mL via INTRAVENOUS

## 2021-12-26 MED ORDER — NITROGLYCERIN 0.4 MG SL SUBL: 0.8000 mg | SUBLINGUAL_TABLET | Freq: Once | SUBLINGUAL | Status: DC

## 2021-12-26 NOTE — Telephone Encounter (Signed)
Received message from Dr. Lalla Brothers:   Lanier Prude, MD  Memory Dance, RN; Sampson Goon, RN That's OK. Continue with current plan.  CL

## 2022-01-01 ENCOUNTER — Ambulatory Visit (INDEPENDENT_AMBULATORY_CARE_PROVIDER_SITE_OTHER): Payer: 59

## 2022-01-01 DIAGNOSIS — I480 Paroxysmal atrial fibrillation: Secondary | ICD-10-CM | POA: Diagnosis not present

## 2022-01-01 DIAGNOSIS — Z5181 Encounter for therapeutic drug level monitoring: Secondary | ICD-10-CM

## 2022-01-01 LAB — POCT INR: INR: 1.8 — AB (ref 2.0–3.0)

## 2022-01-01 NOTE — Pre-Procedure Instructions (Signed)
Spoke with patients wife Dionne.  Instructed on the following items: Arrival time 0530 Nothing to eat or drink after midnight No meds AM of procedure Responsible person to drive you home and stay with you for 24 hrs  Have you missed any doses of anti-coagulant Coumadin, hasn't missed any dose

## 2022-01-01 NOTE — Patient Instructions (Signed)
Description   Called and spoke to pt's wife and instructed pt to take 1 tablet today and then continue taking warfarin 1/2 a tablet daily except for 1 tablet on Mondays and Fridays. Recheck INR in 1 week. - Ablation 01/02/2022   *Please discuss DOAc option with spouse during next INR check - note on file to discuss after insurance change*  Holding off for now.      

## 2022-01-02 ENCOUNTER — Other Ambulatory Visit (HOSPITAL_COMMUNITY): Payer: Self-pay

## 2022-01-02 ENCOUNTER — Ambulatory Visit (HOSPITAL_BASED_OUTPATIENT_CLINIC_OR_DEPARTMENT_OTHER): Payer: 59 | Admitting: Anesthesiology

## 2022-01-02 ENCOUNTER — Encounter (HOSPITAL_COMMUNITY)
Admission: RE | Disposition: A | Discharge status: Home / Self Care | Payer: 59 | Source: Ambulatory Visit | Attending: Cardiology

## 2022-01-02 ENCOUNTER — Ambulatory Visit (HOSPITAL_COMMUNITY)
Admission: RE | Admit: 2022-01-02 | Discharge: 2022-01-02 | Disposition: A | Discharge status: Home / Self Care | Payer: 59 | Source: Ambulatory Visit | Attending: Cardiology | Admitting: Cardiology

## 2022-01-02 ENCOUNTER — Ambulatory Visit (HOSPITAL_BASED_OUTPATIENT_CLINIC_OR_DEPARTMENT_OTHER): Payer: 59

## 2022-01-02 ENCOUNTER — Ambulatory Visit (HOSPITAL_COMMUNITY): Payer: 59 | Admitting: Anesthesiology

## 2022-01-02 ENCOUNTER — Other Ambulatory Visit: Payer: Self-pay

## 2022-01-02 DIAGNOSIS — G4733 Obstructive sleep apnea (adult) (pediatric): Secondary | ICD-10-CM | POA: Diagnosis not present

## 2022-01-02 DIAGNOSIS — E119 Type 2 diabetes mellitus without complications: Secondary | ICD-10-CM | POA: Insufficient documentation

## 2022-01-02 DIAGNOSIS — Z7984 Long term (current) use of oral hypoglycemic drugs: Secondary | ICD-10-CM | POA: Insufficient documentation

## 2022-01-02 DIAGNOSIS — Z87891 Personal history of nicotine dependence: Secondary | ICD-10-CM | POA: Insufficient documentation

## 2022-01-02 DIAGNOSIS — I340 Nonrheumatic mitral (valve) insufficiency: Secondary | ICD-10-CM | POA: Diagnosis not present

## 2022-01-02 DIAGNOSIS — I4891 Unspecified atrial fibrillation: Secondary | ICD-10-CM

## 2022-01-02 DIAGNOSIS — E669 Obesity, unspecified: Secondary | ICD-10-CM | POA: Insufficient documentation

## 2022-01-02 DIAGNOSIS — I119 Hypertensive heart disease without heart failure: Secondary | ICD-10-CM | POA: Diagnosis not present

## 2022-01-02 DIAGNOSIS — I10 Essential (primary) hypertension: Secondary | ICD-10-CM | POA: Diagnosis not present

## 2022-01-02 DIAGNOSIS — E785 Hyperlipidemia, unspecified: Secondary | ICD-10-CM | POA: Diagnosis not present

## 2022-01-02 DIAGNOSIS — I081 Rheumatic disorders of both mitral and tricuspid valves: Secondary | ICD-10-CM

## 2022-01-02 DIAGNOSIS — I4819 Other persistent atrial fibrillation: Secondary | ICD-10-CM | POA: Insufficient documentation

## 2022-01-02 DIAGNOSIS — Z6838 Body mass index (BMI) 38.0-38.9, adult: Secondary | ICD-10-CM | POA: Diagnosis not present

## 2022-01-02 HISTORY — PX: ATRIAL FIBRILLATION ABLATION: EP1191

## 2022-01-02 HISTORY — PX: TEE WITHOUT CARDIOVERSION: SHX5443

## 2022-01-02 LAB — PROTIME-INR
INR: 1.9 — ABNORMAL HIGH (ref 0.8–1.2)
Prothrombin Time: 21.4 seconds — ABNORMAL HIGH (ref 11.4–15.2)

## 2022-01-02 LAB — GLUCOSE, CAPILLARY
Glucose-Capillary: 146 mg/dL — ABNORMAL HIGH (ref 70–99)
Glucose-Capillary: 201 mg/dL — ABNORMAL HIGH (ref 70–99)

## 2022-01-02 SURGERY — ATRIAL FIBRILLATION ABLATION
Anesthesia: General

## 2022-01-02 MED ORDER — HEPARIN (PORCINE) IN NACL 1000-0.9 UT/500ML-% IV SOLN
INTRAVENOUS | Status: DC | PRN
  Administered 2022-01-02 (×3): 500 mL

## 2022-01-02 MED ORDER — SODIUM CHLORIDE 0.9 % IV SOLN: INTRAVENOUS | Status: DC

## 2022-01-02 MED ORDER — ACETAMINOPHEN 500 MG PO TABS
1000.0000 mg | ORAL_TABLET | Freq: Once | ORAL | Status: AC
  Administered 2022-01-02: 1000 mg via ORAL

## 2022-01-02 MED ORDER — HEPARIN SODIUM (PORCINE) 1000 UNIT/ML IJ SOLN
INTRAMUSCULAR | Status: DC | PRN
  Administered 2022-01-02: 10000 [IU] via INTRAVENOUS
  Administered 2022-01-02: 5000 [IU] via INTRAVENOUS
  Administered 2022-01-02: 4000 [IU] via INTRAVENOUS

## 2022-01-02 MED ORDER — HEPARIN SODIUM (PORCINE) 1000 UNIT/ML IJ SOLN
INTRAMUSCULAR | Status: DC | PRN
  Administered 2022-01-02: 1000 [IU] via INTRAVENOUS

## 2022-01-02 MED ORDER — ROCURONIUM BROMIDE 10 MG/ML (PF) SYRINGE
PREFILLED_SYRINGE | INTRAVENOUS | Status: DC | PRN
  Administered 2022-01-02: 70 mg via INTRAVENOUS
  Administered 2022-01-02: 20 mg via INTRAVENOUS
  Administered 2022-01-02: 10 mg via INTRAVENOUS
  Administered 2022-01-02: 20 mg via INTRAVENOUS

## 2022-01-02 MED ORDER — SODIUM CHLORIDE 0.9 % IV SOLN: 250.0000 mL | INTRAVENOUS | Status: DC | PRN

## 2022-01-02 MED ORDER — ENOXAPARIN SODIUM 120 MG/0.8ML IJ SOSY
120.0000 mg | PREFILLED_SYRINGE | Freq: Two times a day (BID) | INTRAMUSCULAR | 0 refills | Status: DC
  Filled 2022-01-02: qty 6.4, 4d supply, fill #0

## 2022-01-02 MED ORDER — FENTANYL CITRATE (PF) 100 MCG/2ML IJ SOLN
INTRAMUSCULAR | Status: AC
  Filled 2022-01-02: qty 2

## 2022-01-02 MED ORDER — SODIUM CHLORIDE 0.9% FLUSH
3.0000 mL | INTRAVENOUS | Status: DC | PRN
Start: 2022-01-02 — End: 2022-01-02

## 2022-01-02 MED ORDER — ACETAMINOPHEN 325 MG PO TABS: 650.0000 mg | ORAL_TABLET | ORAL | Status: DC | PRN

## 2022-01-02 MED ORDER — PROPOFOL 10 MG/ML IV BOLUS
INTRAVENOUS | Status: DC | PRN
  Administered 2022-01-02: 200 mg via INTRAVENOUS

## 2022-01-02 MED ORDER — FISH OIL 1200 MG PO CAPS: 2400.0000 mg | ORAL_CAPSULE | Freq: Two times a day (BID) | ORAL | Status: DC

## 2022-01-02 MED ORDER — ONDANSETRON HCL 4 MG/2ML IJ SOLN: 4.0000 mg | Freq: Four times a day (QID) | INTRAMUSCULAR | Status: DC | PRN

## 2022-01-02 MED ORDER — DEXAMETHASONE SODIUM PHOSPHATE 10 MG/ML IJ SOLN
INTRAMUSCULAR | Status: DC | PRN
  Administered 2022-01-02: 10 mg via INTRAVENOUS

## 2022-01-02 MED ORDER — ONDANSETRON HCL 4 MG/2ML IJ SOLN
INTRAMUSCULAR | Status: DC | PRN
  Administered 2022-01-02: 4 mg via INTRAVENOUS

## 2022-01-02 MED ORDER — FENTANYL CITRATE (PF) 250 MCG/5ML IJ SOLN
INTRAMUSCULAR | Status: DC | PRN
  Administered 2022-01-02: 100 ug via INTRAVENOUS

## 2022-01-02 MED ORDER — SUGAMMADEX SODIUM 200 MG/2ML IV SOLN
INTRAVENOUS | Status: DC | PRN
  Administered 2022-01-02: 400 mg via INTRAVENOUS

## 2022-01-02 MED ORDER — ACETAMINOPHEN 500 MG PO TABS
ORAL_TABLET | ORAL | Status: AC
  Filled 2022-01-02: qty 2

## 2022-01-02 MED ORDER — LIDOCAINE 2% (20 MG/ML) 5 ML SYRINGE
INTRAMUSCULAR | Status: DC | PRN
  Administered 2022-01-02: 60 mg via INTRAVENOUS

## 2022-01-02 MED ORDER — PHENYLEPHRINE HCL-NACL 20-0.9 MG/250ML-% IV SOLN
INTRAVENOUS | Status: DC | PRN
  Administered 2022-01-02: 20 ug/min via INTRAVENOUS

## 2022-01-02 MED ORDER — PANTOPRAZOLE SODIUM 40 MG PO TBEC
40.0000 mg | DELAYED_RELEASE_TABLET | Freq: Every day | ORAL | 0 refills | Status: DC
  Filled 2022-01-02: qty 45, 45d supply, fill #0

## 2022-01-02 MED ORDER — COLCHICINE 0.6 MG PO TABS
0.6000 mg | ORAL_TABLET | Freq: Two times a day (BID) | ORAL | 0 refills | Status: DC
  Filled 2022-01-02: qty 10, 5d supply, fill #0

## 2022-01-02 MED ORDER — SODIUM CHLORIDE 0.9% FLUSH: 3.0000 mL | Freq: Two times a day (BID) | INTRAVENOUS | Status: DC

## 2022-01-02 MED ORDER — ENOXAPARIN SODIUM 120 MG/0.8ML IJ SOSY
120.0000 mg | PREFILLED_SYRINGE | Freq: Two times a day (BID) | INTRAMUSCULAR | Status: DC
Start: 2022-01-02 — End: 2022-01-02
  Administered 2022-01-02: 120 mg via SUBCUTANEOUS
  Filled 2022-01-02: qty 0.8

## 2022-01-02 MED ORDER — PROTAMINE SULFATE 10 MG/ML IV SOLN
INTRAVENOUS | Status: DC | PRN
  Administered 2022-01-02: 30 mg via INTRAVENOUS

## 2022-01-02 MED ORDER — HEPARIN SODIUM (PORCINE) 1000 UNIT/ML IJ SOLN
INTRAMUSCULAR | Status: AC
  Filled 2022-01-02: qty 10

## 2022-01-02 SURGICAL SUPPLY — 18 items
BLANKET WARM UNDERBOD FULL ACC (MISCELLANEOUS) ×3 IMPLANT
CATH 8FR REPROCESSED SOUNDSTAR (CATHETERS) ×3 IMPLANT
CATH 8FR SOUNDSTAR REPROCESSED (CATHETERS) IMPLANT
CATH ABLAT QDOT MICRO BI TC DF (CATHETERS) ×1 IMPLANT
CATH OCTARAY 2.0 F 3-3-3-3-3 (CATHETERS) ×1 IMPLANT
CATH S CIRCA THERM PROBE 10F (CATHETERS) ×1 IMPLANT
CATH WEB BI DIR CSDF CRV REPRO (CATHETERS) ×1 IMPLANT
CLOSURE PERCLOSE PROSTYLE (VASCULAR PRODUCTS) ×3 IMPLANT
COVER SWIFTLINK CONNECTOR (BAG) ×3 IMPLANT
PACK EP LATEX FREE (CUSTOM PROCEDURE TRAY) ×3
PACK EP LF (CUSTOM PROCEDURE TRAY) ×2 IMPLANT
PAD DEFIB RADIO PHYSIO CONN (PAD) ×3 IMPLANT
PATCH CARTO3 (PAD) ×1 IMPLANT
SHEATH BAYLIS TRANSSEPTAL 98CM (NEEDLE) ×1 IMPLANT
SHEATH CARTO VIZIGO SM CVD (SHEATH) ×1 IMPLANT
SHEATH PINNACLE 8F 10CM (SHEATH) ×2 IMPLANT
SHEATH PINNACLE 9F 10CM (SHEATH) ×1 IMPLANT
SHEATH PROBE COVER 6X72 (BAG) ×1 IMPLANT

## 2022-01-02 NOTE — Discharge Instructions (Signed)

## 2022-01-02 NOTE — CV Procedure (Addendum)
    TRANSESOPHAGEAL ECHOCARDIOGRAM   NAME:  Daniel Grant   MRN: [id number] DOB:  01-10-1966   ADMIT DATE: 01/02/2022  INDICATIONS:  Atrial fibrillation   PROCEDURE:   Informed consent was obtained prior to the procedure. The risks, benefits and alternatives for the procedure were discussed and the patient comprehended these risks.  Risks include, but are not limited to, cough, sore throat, vomiting, nausea, somnolence, esophageal and stomach trauma or perforation, bleeding, low blood pressure, aspiration, pneumonia, infection, trauma to the teeth and death.    The patient was already sedated and intubated in the EP lab. After a procedural time-out, the  transesophageal probe was inserted in the esophagus and stomach without difficulty and multiple views were obtained.    COMPLICATIONS:    There were no immediate complications.  FINDINGS:  LEFT VENTRICLE: EF = 40-45% Global HK   RIGHT VENTRICLE: Mildly HK  LEFT ATRIUM: Moderately dilated  LEFT ATRIAL APPENDAGE: No thrombus.   RIGHT ATRIUM: Mildly dilated  AORTIC VALVE:  Trileaflet. Normal  MITRAL VALVE:    Normal. Mild MR  TRICUSPID VALVE: Normal. Mild TR  PULMONIC VALVE: Grossly normal.  INTERATRIAL SEPTUM: Probable very small PFO   PERICARDIUM: No effusion  DESCENDING AORTA: Mild plaque   Pressley Tadesse,MD 8:17 AM

## 2022-01-02 NOTE — Anesthesia Procedure Notes (Signed)
Procedure Name: Intubation Date/Time: 01/02/2022 7:54 AM  Performed by: Thelma Comp, CRNAPre-anesthesia Checklist: Patient identified, Emergency Drugs available, Suction available and Patient being monitored Patient Re-evaluated:Patient Re-evaluated prior to induction Oxygen Delivery Method: Circle System Utilized Preoxygenation: Pre-oxygenation with 100% oxygen Induction Type: IV induction Ventilation: Mask ventilation without difficulty and Oral airway inserted - appropriate to patient size Laryngoscope Size: Mac and 4 Grade View: Grade I Tube type: Oral Tube size: 7.5 mm Number of attempts: 1 Airway Equipment and Method: Stylet and Oral airway Placement Confirmation: ETT inserted through vocal cords under direct vision, positive ETCO2 and breath sounds checked- equal and bilateral Secured at: 23 cm Tube secured with: Tape Dental Injury: Teeth and Oropharynx as per pre-operative assessment

## 2022-01-02 NOTE — Anesthesia Preprocedure Evaluation (Signed)
Anesthesia Evaluation  Patient identified by MRN, date of birth, ID band Patient awake    Reviewed: Allergy & Precautions, NPO status , Patient's Chart, lab work & pertinent test results  Airway Mallampati: III  TM Distance: >3 FB Neck ROM: Full    Dental  (+) Dental Advisory Given   Pulmonary sleep apnea , former smoker,    breath sounds clear to auscultation       Cardiovascular hypertension, Pt. on medications and Pt. on home beta blockers + dysrhythmias Atrial Fibrillation  Rhythm:Regular Rate:Normal     Neuro/Psych negative neurological ROS     GI/Hepatic negative GI ROS, Neg liver ROS,   Endo/Other  diabetes, Type 2, Oral Hypoglycemic Agents  Renal/GU negative Renal ROS     Musculoskeletal   Abdominal   Peds  Hematology  (+) Blood dyscrasia (On coumadin), ,   Anesthesia Other Findings   Reproductive/Obstetrics                             Lab Results  Component Value Date   WBC 10.5 12/09/2021   HGB 15.7 12/09/2021   HCT 45.7 12/09/2021   MCV 84 12/09/2021   PLT 219 12/09/2021   Lab Results  Component Value Date   CREATININE 0.89 12/09/2021   BUN 26 (H) 12/09/2021   NA 136 12/09/2021   K 4.2 12/09/2021   CL 103 12/09/2021   CO2 25 12/09/2021    Anesthesia Physical Anesthesia Plan  ASA: 3  Anesthesia Plan: General   Post-op Pain Management: Tylenol PO (pre-op)*   Induction: Intravenous  PONV Risk Score and Plan: 2 and Dexamethasone, Ondansetron and Treatment may vary due to age or medical condition  Airway Management Planned: Oral ETT  Additional Equipment:   Intra-op Plan:   Post-operative Plan: Extubation in OR  Informed Consent: I have reviewed the patients History and Physical, chart, labs and discussed the procedure including the risks, benefits and alternatives for the proposed anesthesia with the patient or authorized representative who has indicated  his/her understanding and acceptance.     Dental advisory given  Plan Discussed with: CRNA  Anesthesia Plan Comments:         Anesthesia Quick Evaluation

## 2022-01-02 NOTE — Progress Notes (Signed)
  Echocardiogram Echocardiogram Transesophageal has been performed.  Gerda Diss 01/02/2022, 9:14 AM

## 2022-01-02 NOTE — Progress Notes (Signed)
Patient and wife was given discharge instructions. Both verbalized understanding. 

## 2022-01-02 NOTE — Anesthesia Postprocedure Evaluation (Signed)
Anesthesia Post Note  Patient: Daniel Grant  Procedure(s) Performed: ATRIAL FIBRILLATION ABLATION TRANSESOPHAGEAL ECHOCARDIOGRAM (TEE)     Patient location during evaluation: PACU Anesthesia Type: General Level of consciousness: awake and alert Pain management: pain level controlled Vital Signs Assessment: post-procedure vital signs reviewed and stable Respiratory status: spontaneous breathing, nonlabored ventilation, respiratory function stable and patient connected to nasal cannula oxygen Cardiovascular status: blood pressure returned to baseline and stable Postop Assessment: no apparent nausea or vomiting Anesthetic complications: no   No notable events documented.  Last Vitals:  Vitals:   01/02/22 1230 01/02/22 1300  BP: (!) 128/95 (!) 136/107  Pulse: 67 74  Resp: 15 20  Temp:    SpO2: 98% 97%    Last Pain:  Vitals:   01/02/22 1100  TempSrc:   PainSc: 0-No pain                 Kennieth Rad

## 2022-01-02 NOTE — H&P (Signed)
Electrophysiology Office Note:     Date:  01/02/2022    ID:  Daniel Grant, DOB [birth year], MRN [id number]   PCP:  Midge Minium, MD      [facility] Cardiologist:  None  [facility] Electrophysiologist:  Vickie Epley, MD    Referring MD: Lelon Perla, MD    Chief Complaint: Atrial fibrillation   History of Present Illness:     Daniel Grant is a 56 y.o. male who presents for an evaluation of atrial fibrillation at the request of Dr. Stanford Breed. Their medical history includes diabetes, hypertension, hyperlipidemia, obesity, obstructive sleep apnea on CPAP.  The patient is in clinic today with his wife who have not previously met.  She is a Marine scientist here with [employer].  He has symptomatic atrial fibrillation.  He had a cardioversion earlier in 2022.  After the cardioversion he maintain normal rhythm for several weeks.  During that time he felt significantly better with improved energy levels.  He tells me that the longer he is in atrial fibrillation he can tell that his stamina has decreased.  He is interested in a rhythm control strategy.  He knows he can work on his weight.  He eats a lot of sweets and large portions.  He uses a CPAP nightly.  He has an appointment scheduled with sleep medicine to discuss his CPAP and may need an additional titration of his CPAP machine.    Plan for PVI today. TEE preprocedure.   Objective      Past Medical History:  Diagnosis Date   DM II (diabetes mellitus, type II), controlled ([place])     Erectile dysfunction     Hyperlipidemia     Hypertension     Obesity     OSA (obstructive sleep apnea)     Paroxysmal atrial fibrillation [facility])             Past Surgical History:  Procedure Laterality Date   CARDIAC CATHETERIZATION       CARDIOVERSION N/A 09/10/2020    Procedure: CARDIOVERSION;  Surgeon: Lelon Perla, MD;  Location: [facility] ENDOSCOPY;  Service: Cardiovascular;  Laterality: N/A;   FINGER SURGERY        left 1st finger       Current Medications: Active Medications      Current Meds  Medication Sig   benazepril (LOTENSIN) 20 MG tablet Take 1 tablet (20 mg total) by mouth daily.   fluticasone (FLONASE) 50 MCG/ACT nasal spray Place 2 sprays into both nostrils daily.   hydrochlorothiazide (HYDRODIURIL) 12.5 MG tablet TAKE 1 TABLET BY MOUTH  DAILY   metFORMIN (GLUCOPHAGE-XR) 500 MG 24 hr tablet TAKE 2 TABLETS BY MOUTH  BEFORE BREAKFAST AND 1  TABLET BY MOUTH WITH SUPPER   metoprolol (TOPROL-XL) 100 MG 24 hr tablet Take 1 tablet (100 mg total) by mouth daily. WITH OR IMMEDIATELY  FOLLOWING A MEAL   Multiple Vitamin (MULTIVITAMIN) tablet Take 1 tablet by mouth daily.   Omega-3 Fatty Acids (FISH OIL) 1200 MG CAPS Take 2,400 capsules by mouth 2 (two) times daily.   pravastatin (PRAVACHOL) 40 MG tablet TAKE 1 TABLET BY MOUTH  DAILY   sildenafil (VIAGRA) 100 MG tablet TAKE 1 TABLET BY MOUTH  DAILY AS NEEDED FOR  ERECTILE DYSFUNCTION (Patient taking differently: Take 100 mg by mouth as needed for erectile dysfunction. TAKE 1 TABLET BY MOUTH  DAILY AS NEEDED FOR  ERECTILE DYSFUNCTION)   warfarin (COUMADIN) 10 MG tablet TAKE 1/2 TO  1 TABLET BY  MOUTH DAILY AS DIRECTED BY  COUMADIN CLINIC        Allergies:   Patient has no known allergies.    Social History         Socioeconomic History   Marital status: Married      Spouse name: Not on file   Number of children: Not on file   Years of education: Not on file   Highest education level: Not on file  Occupational History   Not on file  Tobacco Use   Smoking status: Former      Types: Cigarettes      Quit date: 05/26/1994      Years since quitting: 26.8   Smokeless tobacco: Never   Tobacco comments:      quit in 1995  Vaping Use   Vaping Use: Never used  Substance and Sexual Activity   Alcohol use: Yes      Alcohol/week: 5.0 standard drinks      Types: 5 Cans of beer per week   Drug use: No   Sexual activity: Yes  Other Topics Concern   Not on file   Social History Narrative   Not on file    Social Determinants of Health    Financial Resource Strain: Not on file  Food Insecurity: Not on file  Transportation Needs: Not on file  Physical Activity: Not on file  Stress: Not on file  Social Connections: Not on file      Family History: The patient's family history includes Alcohol abuse in his father; Diabetes in his mother; Heart attack in his maternal grandmother and paternal uncle; Heart disease in his mother; Hypertension in his mother. There is no history of Cancer, Early death, Hyperlipidemia, Kidney disease, or Stroke.   ROS:   Please see the history of present illness.    All other systems reviewed and are negative.   EKGs/Labs/Other Studies Reviewed:     The following studies were reviewed today:   August 09, 2020 echo Left ventricular function normal, 55% Right ventricular function normal Moderately dilated left atrium Mild MR  September 10, 2020 EKG post cardioversion QTC is 440 ms, ventricular rate 66 bpm, sinus rhythm   EKG:  The ekg ordered today demonstrates atrial fibrillation with rapid ventricular rate.  Ventricular rate of 121 bpm.  QTc is 477 ms.     Recent Labs: 10/16/2020: ALT 30; Hemoglobin 15.8; Platelets 199.0; TSH 0.75 02/04/2021: BUN 20; Creatinine, Ser 0.87; Potassium 4.2; Sodium 139  Recent Lipid Panel Labs (Brief)          Component Value Date/Time    CHOL 150 10/16/2020 1621    CHOL 128 07/27/2020 1015    TRIG 158.0 (H) 10/16/2020 1621    HDL 35.80 (L) 10/16/2020 1621    HDL 33 (L) 07/27/2020 1015    CHOLHDL 4 10/16/2020 1621    VLDL 31.6 10/16/2020 1621    LDLCALC 82 10/16/2020 1621    LDLCALC 70 07/27/2020 1015    LDLDIRECT 80.0 12/15/2018 1359        Physical Exam:     VS:  BP 115/99   Pulse 75   Ht _0  (1.778 m)   Wt 267 lb (121.1 kg)   SpO2 98%   BMI 38.31 kg/m         Wt Readings from Last 3 Encounters:  03/29/21 267 lb (121.1 kg)  03/18/21 278 lb (126.1 kg)   02/04/21 274 lb 9.6 oz (124.6 kg)  GEN:  Well nourished, well developed in no acute distress.  Obese HEENT: Normal NECK: No JVD; No carotid bruits LYMPHATICS: No lymphadenopathy CARDIAC: irregularly irregular, no murmurs, rubs, gallops RESPIRATORY:  Clear to auscultation without rales, wheezing or rhonchi  ABDOMEN: Soft, non-tender, non-distended MUSCULOSKELETAL:  No edema; No deformity  SKIN: Warm and dry NEUROLOGIC:  Alert and oriented x 3 PSYCHIATRIC:  Normal affect          Assessment ASSESSMENT:     1. Persistent atrial fibrillation ([place])   2. OSA (obstructive sleep apnea)   3. Essential hypertension   4. Controlled type 2 diabetes mellitus without complication, without long-term current use of insulin (HCC)   5. Obesity, unspecified classification, unspecified obesity type, unspecified whether serious comorbidity present     PLAN:     In order of problems listed above:   #Persistent atrial fibrillation Symptomatic.  Poorly rate controlled.  On Coumadin for stroke prophylaxis. Discussed rhythm control strategies with the patient including initiation of an antiarrhythmic drug such as dofetilide versus catheter ablation.  I do think he is a candidate for catheter ablation.  I would like him to lose some weight prior to the catheter ablation procedure.  We discussed this at length during today's appointment.  I discussed the procedure in detail include the risk, recovery and likelihood of success.  He would like to proceed with scheduling.  He will need a CT scan prior to the procedure.  Risk, benefits, and alternatives to EP study and radiofrequency ablation for afib were also discussed in detail today. These risks include but are not limited to stroke, bleeding, vascular damage, tamponade, perforation, damage to the esophagus, lungs, and other structures, pulmonary vein stenosis, worsening renal function, and death. The patient understands these risk and wishes to  proceed.  We will therefore proceed with catheter ablation at the next available time.  Carto, ICE, anesthesia are requested for the procedure.  Will also obtain CT PV protocol prior to the procedure to exclude LAA thrombus and further evaluate atrial anatomy.   His INR is slightly low today at 1.9. Pharmacy has increased his dose of coumadin last night with INR 1.8>1.9. I will have a TEE performed pre-procedure to confirm no LAA thrombus. TEE discussed with the patient including the risks and he wishes to proceed. Will plan to discharge on higher dose of coumadin tonight with early recheck on INR on Monday.       Signed, Hilton Cork. Quentin Ore, MD, [facility], [facility] 01/02/2022 Electrophysiology [facility]

## 2022-01-02 NOTE — Transfer of Care (Signed)
Immediate Anesthesia Transfer of Care Note  Patient: Daniel Grant  Procedure(s) Performed: ATRIAL FIBRILLATION ABLATION TRANSESOPHAGEAL ECHOCARDIOGRAM (TEE)  Patient Location: PACU and Cath Lab  Anesthesia Type:General  Level of Consciousness: drowsy and patient cooperative  Airway & Oxygen Therapy: Patient Spontanous Breathing  Post-op Assessment: Report given to RN and Post -op Vital signs reviewed and stable  Post vital signs: Reviewed and stable  Last Vitals:  Vitals Value Taken Time  BP 117/75 01/02/22 1017  Temp    Pulse 63 01/02/22 1018  Resp 16 01/02/22 1018  SpO2 95 % 01/02/22 1018  Vitals shown include unvalidated device data.  Last Pain:  Vitals:   01/02/22 0605  PainSc: 0-No pain      Patients Stated Pain Goal: 3 (01/02/22 5797)  Complications: No notable events documented.

## 2022-01-02 NOTE — Interval H&P Note (Signed)
History and Physical Interval Note:  01/02/2022 8:15 AM  Daniel Grant  has presented today for surgery, with the diagnosis of afib.  The various methods of treatment have been discussed with the patient and family. After consideration of risks, benefits and other options for treatment, the patient has consented to  Transesophageal Echocardiogram as a surgical intervention.  The patient's history has been reviewed, patient examined, no change in status, stable for surgery.  I have reviewed the patient's chart and labs.  Questions were answered to the patient's satisfaction.     Otis Burress

## 2022-01-03 ENCOUNTER — Encounter (HOSPITAL_COMMUNITY): Payer: Self-pay | Admitting: Cardiology

## 2022-01-03 ENCOUNTER — Telehealth: Payer: Self-pay | Admitting: Cardiology

## 2022-01-03 ENCOUNTER — Encounter: Payer: Self-pay | Admitting: Cardiology

## 2022-01-03 DIAGNOSIS — Z0279 Encounter for issue of other medical certificate: Secondary | ICD-10-CM

## 2022-01-03 LAB — POCT ACTIVATED CLOTTING TIME
Activated Clotting Time: 251 seconds
Activated Clotting Time: 305 seconds
Activated Clotting Time: 323 seconds

## 2022-01-03 NOTE — Telephone Encounter (Signed)
FMLA documents received, Payment received, forms will be in providers box  Thank you

## 2022-01-08 ENCOUNTER — Telehealth: Payer: Self-pay | Admitting: *Deleted

## 2022-01-08 LAB — POCT INR: INR: 2.3 (ref 2.0–3.0)

## 2022-01-08 NOTE — Telephone Encounter (Signed)
Called wife since pt INR monitoring is due, spoke with wife and she stated she would check INR.

## 2022-01-09 ENCOUNTER — Ambulatory Visit (INDEPENDENT_AMBULATORY_CARE_PROVIDER_SITE_OTHER): Payer: 59 | Admitting: Cardiology

## 2022-01-09 DIAGNOSIS — I480 Paroxysmal atrial fibrillation: Secondary | ICD-10-CM | POA: Diagnosis not present

## 2022-01-09 DIAGNOSIS — Z5181 Encounter for therapeutic drug level monitoring: Secondary | ICD-10-CM | POA: Diagnosis not present

## 2022-01-09 NOTE — Telephone Encounter (Signed)
Forms completed and returned to front desk for faxing and pt contact.

## 2022-01-13 ENCOUNTER — Encounter: Payer: 59 | Admitting: Family Medicine

## 2022-01-20 ENCOUNTER — Encounter (HOSPITAL_BASED_OUTPATIENT_CLINIC_OR_DEPARTMENT_OTHER): Payer: Self-pay | Admitting: Emergency Medicine

## 2022-01-20 ENCOUNTER — Other Ambulatory Visit (HOSPITAL_BASED_OUTPATIENT_CLINIC_OR_DEPARTMENT_OTHER): Payer: Self-pay

## 2022-01-20 ENCOUNTER — Emergency Department (HOSPITAL_BASED_OUTPATIENT_CLINIC_OR_DEPARTMENT_OTHER)
Admission: EM | Admit: 2022-01-20 | Discharge: 2022-01-20 | Disposition: A | Discharge status: Home / Self Care | Payer: 59 | Attending: Emergency Medicine | Admitting: Emergency Medicine

## 2022-01-20 ENCOUNTER — Ambulatory Visit (INDEPENDENT_AMBULATORY_CARE_PROVIDER_SITE_OTHER): Payer: 59 | Admitting: *Deleted

## 2022-01-20 ENCOUNTER — Emergency Department (HOSPITAL_BASED_OUTPATIENT_CLINIC_OR_DEPARTMENT_OTHER): Payer: 59 | Admitting: Radiology

## 2022-01-20 ENCOUNTER — Other Ambulatory Visit: Payer: Self-pay

## 2022-01-20 DIAGNOSIS — M542 Cervicalgia: Secondary | ICD-10-CM | POA: Diagnosis present

## 2022-01-20 DIAGNOSIS — I4891 Unspecified atrial fibrillation: Secondary | ICD-10-CM

## 2022-01-20 DIAGNOSIS — I480 Paroxysmal atrial fibrillation: Secondary | ICD-10-CM

## 2022-01-20 DIAGNOSIS — R000 Tachycardia, unspecified: Secondary | ICD-10-CM | POA: Insufficient documentation

## 2022-01-20 DIAGNOSIS — Z7901 Long term (current) use of anticoagulants: Secondary | ICD-10-CM | POA: Diagnosis not present

## 2022-01-20 DIAGNOSIS — M436 Torticollis: Secondary | ICD-10-CM

## 2022-01-20 LAB — BASIC METABOLIC PANEL
Anion gap: 11 (ref 5–15)
BUN: 17 mg/dL (ref 6–20)
CO2: 24 mmol/L (ref 22–32)
Calcium: 9.2 mg/dL (ref 8.9–10.3)
Chloride: 101 mmol/L (ref 98–111)
Creatinine, Ser: 0.85 mg/dL (ref 0.61–1.24)
GFR, Estimated: >60 mL/min (ref >60)
Glucose, Bld: 289 mg/dL — ABNORMAL HIGH (ref 70–99)
Potassium: 4.1 mmol/L (ref 3.5–5.1)
Sodium: 136 mmol/L (ref 135–145)

## 2022-01-20 LAB — CBC
HCT: 40.8 % (ref 39.0–52.0)
Hemoglobin: 14.3 g/dL (ref 13.0–17.0)
MCH: 28.5 pg (ref 26.0–34.0)
MCHC: 35 g/dL (ref 30.0–36.0)
MCV: 81.4 fL (ref 80.0–100.0)
Platelets: 192 10*3/uL (ref 150–400)
RBC: 5.01 MIL/uL (ref 4.22–5.81)
RDW: 13 % (ref 11.5–15.5)
WBC: 14 10*3/uL — ABNORMAL HIGH (ref 4.0–10.5)
nRBC: 0 % (ref 0.0–0.2)

## 2022-01-20 LAB — PROTIME-INR
INR: 1.6 — ABNORMAL HIGH (ref 0.8–1.2)
Prothrombin Time: 19 seconds — ABNORMAL HIGH (ref 11.4–15.2)

## 2022-01-20 MED ORDER — DIAZEPAM 5 MG PO TABS
5.0000 mg | ORAL_TABLET | Freq: Once | ORAL | Status: AC
  Administered 2022-01-20: 5 mg via ORAL
  Filled 2022-01-20: qty 1

## 2022-01-20 MED ORDER — OXYCODONE-ACETAMINOPHEN 5-325 MG PO TABS
1.0000 | ORAL_TABLET | Freq: Four times a day (QID) | ORAL | 0 refills | Status: DC | PRN
  Filled 2022-01-20: qty 15, 4d supply, fill #0

## 2022-01-20 MED ORDER — HYDROMORPHONE HCL 1 MG/ML IJ SOLN
1.0000 mg | Freq: Once | INTRAMUSCULAR | Status: AC
  Administered 2022-01-20: 1 mg via INTRAVENOUS
  Filled 2022-01-20: qty 1

## 2022-01-20 MED ORDER — LORAZEPAM 2 MG/ML IJ SOLN
0.5000 mg | Freq: Once | INTRAMUSCULAR | Status: AC
Start: 2022-01-20 — End: 2022-01-20
  Administered 2022-01-20: 0.5 mg via INTRAVENOUS
  Filled 2022-01-20: qty 1

## 2022-01-20 MED ORDER — METOPROLOL TARTRATE 5 MG/5ML IV SOLN
5.0000 mg | Freq: Once | INTRAVENOUS | Status: AC
  Administered 2022-01-20: 5 mg via INTRAVENOUS
  Filled 2022-01-20: qty 5

## 2022-01-20 MED ORDER — METHOCARBAMOL 750 MG PO TABS
750.0000 mg | ORAL_TABLET | Freq: Four times a day (QID) | ORAL | 0 refills | Status: DC
Start: 2022-01-20 — End: 2022-04-30
  Filled 2022-01-20: qty 30, 8d supply, fill #0

## 2022-01-20 MED ORDER — HYDROMORPHONE HCL 1 MG/ML IJ SOLN
0.5000 mg | Freq: Once | INTRAMUSCULAR | Status: AC
  Administered 2022-01-20: 0.5 mg via INTRAVENOUS
  Filled 2022-01-20: qty 1

## 2022-01-20 NOTE — ED Provider Notes (Signed)
[facility] EMERGENCY DEPT Provider Note   CSN: [id number] Arrival date & time: 01/20/22  2637     History  Chief Complaint  Patient presents with   Neck Injury   Irregular Heart Beat    Daniel Grant is a 56 y.o. male.  56 year old male presents with right-sided neck pain that began after he slept funny in a chair.  Patient describes the pain as sharp and stabbing worse when he turns his head to the left.  No numbness or tingling to his right or left arm.  No headache.  Patient had a cardiac ablation for A-fib about 2 weeks ago.  States that over a week ago he felt he was back in A-fib.  He does take Coumadin.  He has been compliant with his beta-blocker as well as calcium channel blocker.  Denies any chest pain or shortness of breath.  Has not called his cardiologist       Home Medications Prior to Admission medications   Medication Sig Start Date End Date Taking? Authorizing Provider  benazepril (LOTENSIN) 20 MG tablet Take 1 tablet (20 mg total) by mouth daily. 12/13/21   Lewayne Bunting, MD  cetirizine (ZYRTEC) 10 MG tablet Take 10 mg by mouth daily.    [provider]  colchicine 0.6 MG tablet Take 1 tablet (0.6 mg total) by mouth 2 (two) times daily for 5 days. 01/02/22 01/07/22  Graciella Freer, PA-C  diltiazem (CARDIZEM CD) 180 MG 24 hr capsule TAKE 1 CAPSULE BY MOUTH  DAILY 10/24/21   Lewayne Bunting, MD  enoxaparin (LOVENOX) 120 MG/0.8ML injection Inject 1 syringe (120 mg total) into the skin every 12 (twelve) hours for 8 doses. Stop once INR > 2.0. 01/02/22 01/06/22  Graciella Freer, PA-C  fluticasone [employer]) 50 MCG/ACT nasal spray Place 2 sprays into both nostrils daily. 09/24/15   Allegra Grana, FNP  hydrochlorothiazide (HYDRODIURIL) 12.5 MG tablet TAKE 1 TABLET BY MOUTH  DAILY 10/24/21   Lewayne Bunting, MD  metFORMIN (GLUCOPHAGE-XR) 500 MG 24 hr tablet TAKE 2 TABLETS BY MOUTH  BEFORE BREAKFAST AND 1  TABLET BY MOUTH WITH  SUPPER 10/04/21   Sheliah Hatch, MD  metoprolol succinate (TOPROL-XL) 100 MG 24 hr tablet TAKE 1 TABLET BY MOUTH  DAILY WITH OR IMMEDIATELY  FOLLOWING A MEAL 10/24/21   Crenshaw, Madolyn Frieze, MD  metoprolol tartrate (LOPRESSOR) 25 MG tablet Take 1 tablet (25 mg total) by mouth Once PRN for up to 1 dose (If heart rate is over 80 two hours prior to CT scan, Take.). Patient not taking: Reported on 12/30/2021 10/29/21   Lanier Prude, MD  Multiple Vitamin (MULTIVITAMIN) tablet Take 1 tablet by mouth daily.    [provider]  Omega-3 Fatty Acids (FISH OIL) 1200 MG CAPS Take 2 capsules (2,400 mg total) by mouth 2 (two) times daily. 01/02/22   Graciella Freer, PA-C  pantoprazole (PROTONIX) 40 MG tablet Take 1 tablet (40 mg total) by mouth daily. 01/02/22 02/16/22  Graciella Freer, PA-C  pravastatin (PRAVACHOL) 40 MG tablet Take 1 tablet (40 mg total) by mouth daily. 05/23/21   Lewayne Bunting, MD  sildenafil (VIAGRA) 100 MG tablet TAKE 1 TABLET BY MOUTH ONCE DAILY AS NEEDED FOR ERECTILE DYSFUNCTION 04/11/21   Sheliah Hatch, MD  warfarin (COUMADIN) 10 MG tablet TAKE 0.5-1 TABLET BY  MOUTH DAILY AS DIRECTED BY  COUMADIN CLINIC Patient taking differently: Take 5-10 mg by mouth See admin instructions.  TAKE 0.5-1 TABLET BY  MOUTH DAILY AS DIRECTED BY COUMADIN CLINIC; 10 mg on Mon and Fri, 5 mg all other days 10/24/21   Lewayne Bunting, MD      Allergies    Patient has no known allergies.    Review of Systems   Review of Systems  All other systems reviewed and are negative.   Physical Exam Updated Vital Signs BP (!) 155/89 (BP Location: Right Arm)   Pulse (!) 108   Temp 98.4 F (36.9 C)   Resp 20   Ht 1.778 m (5\' 10" )   Wt 121.6 kg   SpO2 99%   BMI 38.45 kg/m  Physical Exam Vitals and nursing note reviewed.  Constitutional:      General: He is not in acute distress.    Appearance: Normal appearance. He is well-developed. He is not toxic-appearing.  HENT:      Head: Normocephalic and atraumatic.  Eyes:     General: Lids are normal.     Conjunctiva/sclera: Conjunctivae normal.     Pupils: Pupils are equal, round, and reactive to light.  Neck:     Thyroid: No thyroid mass.     Trachea: No tracheal deviation.  Cardiovascular:     Rate and Rhythm: Tachycardia present. Rhythm irregular.     Heart sounds: Normal heart sounds. No murmur heard.    No gallop.  Pulmonary:     Effort: Pulmonary effort is normal. No respiratory distress.     Breath sounds: Normal breath sounds. No stridor. No decreased breath sounds, wheezing, rhonchi or rales.  Abdominal:     General: There is no distension.     Palpations: Abdomen is soft.     Tenderness: There is no abdominal tenderness. There is no rebound.  Musculoskeletal:        General: No tenderness. Normal range of motion.     Cervical back: Normal range of motion and neck supple.  Skin:    General: Skin is warm and dry.     Findings: No abrasion or rash.  Neurological:     General: No focal deficit present.     Mental Status: He is alert and oriented to person, place, and time. Mental status is at baseline.     GCS: GCS eye subscore is 4. GCS verbal subscore is 5. GCS motor subscore is 6.     Cranial Nerves: No cranial nerve deficit.     Sensory: No sensory deficit.     Motor: Motor function is intact.  Psychiatric:        Attention and Perception: Attention normal.        Speech: Speech normal.        Behavior: Behavior normal.     ED Results / Procedures / Treatments   Labs (all labs ordered are listed, but only abnormal results are displayed) Labs Reviewed  BASIC METABOLIC PANEL  CBC  PROTIME-INR    EKG EKG Interpretation  Date/Time:  Monday January 20 2022 09:37:35 EDT Ventricular Rate:  125 PR Interval:    QRS Duration: 84 QT Interval:  266 QTC Calculation: 383 R Axis:   57 Text Interpretation: Atrial fibrillation with rapid ventricular response Nonspecific T wave abnormality  Abnormal ECG When compared with ECG of 02-Jan-2022 10:20, Atrial fibrillation has replaced Sinus rhythm Vent. rate has increased BY  63 BPM Nonspecific T wave abnormality now evident in Inferior leads Nonspecific T wave abnormality now evident in Lateral leads Confirmed by 04-Jan-2022 (Lorre Nick) on 01/20/2022  9:54:46 AM  Radiology DG Chest 2 View  Result Date: 01/20/2022 CLINICAL DATA:  56 year old male with right neck pain radiating to head. Recent cardiac ablation. Atrial fibrillation. EXAM: CHEST - 2 VIEW COMPARISON:  Cardiac CT 12/26/2021 and earlier. FINDINGS: Cardiac silhouette appears decreased compared to 2011. Borderline cardiomegaly now. Other mediastinal contours are within normal limits. Visualized tracheal air column is within normal limits. Lung volumes are stable and within normal limits. No pneumothorax or pleural effusion. No pulmonary edema or confluent pulmonary opacity. Flowing thoracic endplate osteophytes. No acute osseous abnormality identified. Negative visible bowel gas. IMPRESSION: No acute cardiopulmonary abnormality. Electronically Signed   By: Odessa Fleming M.D.   On: 01/20/2022 10:25    Procedures Procedures    Medications Ordered in ED Medications  LORazepam (ATIVAN) injection 0.5 mg (has no administration in time range)  HYDROmorphone (DILAUDID) injection 0.5 mg (has no administration in time range)  metoprolol tartrate (LOPRESSOR) injection 5 mg (has no administration in time range)    ED Course/ Medical Decision Making/ A&P                           Medical Decision Making Amount and/or Complexity of Data Reviewed Labs: ordered. Radiology: ordered.  Risk Prescription drug management.   Patient presented with right-sided neck pain which appears to be torticollis.  Do not feel that patient needs imaging at this time.  Patient treated and pain is somewhat improved.  Patient was noted to be in A-fib with RVR per my review of his EKG and interpreted.  Patient  treated with Lopressor 5 mg IV push and heart rate has improved.  Patient is on Coumadin as INR is 1.6.  Discussed with Dr. Cristal Deer from cardiology who states that patient can follow-up in the A-fib clinic and they will adjust his Coumadin there and consider adding amiodarone to his regimen.  Patient monitored here and heart rate and blood pressure remained stable.  Case discussed with his wife at bedside who is also a Engineer, civil (consulting) and she is agreeable to taking him home.  Will place patient on pain medication muscle relaxants.  Strict return precautions given  CRITICAL CARE Performed by: Toy Baker Total critical care time: 60 minutes Critical care time was exclusive of separately billable procedures and treating other patients. Critical care was necessary to treat or prevent imminent or life-threatening deterioration. Critical care was time spent personally by me on the following activities: development of treatment plan with patient and/or surrogate as well as nursing, discussions with consultants, evaluation of patient's response to treatment, examination of patient, obtaining history from patient or surrogate, ordering and performing treatments and interventions, ordering and review of laboratory studies, ordering and review of radiographic studies, pulse oximetry and re-evaluation of patient's condition.         Final Clinical Impression(s) / ED Diagnoses Final diagnoses:  None    Rx / DC Orders ED Discharge Orders     None         Lorre Nick, MD 01/20/22 1359

## 2022-01-20 NOTE — ED Notes (Signed)
Patient transported to X-ray 

## 2022-01-20 NOTE — ED Notes (Signed)
Patient verbalizes understanding of discharge instructions. Opportunity for questioning and answers were provided. Patient discharged from ED.  °

## 2022-01-20 NOTE — Patient Instructions (Addendum)
Description   Spoke to pt's wife, on DPR, and instructed pt to take 1.5 tablets then continue taking warfarin 1/2 tablet daily except for 1 tablet on Mondays and Fridays. Recheck INR in 1 week (normally 2 weeks). *Please discuss DOAc option with spouse during next INR check - note on file to discuss after insurance change*  Holding off for now.

## 2022-01-20 NOTE — ED Triage Notes (Signed)
Pt from home c/o of pain to the right side of his neck that radiates to his head. Pt stated that pain stared on Saturday after taking a nap.

## 2022-01-21 NOTE — Progress Notes (Signed)
HPI: FU atrial fibrillation. Cardiac catheterization in January of 2001 showed normal LV function and normal coronary arteries. Nuclear study in June of 2011 showed soft tissue attenuation and no ischemia. Echocardiogram repeated March 2022 and showed normal LV function, moderate left ventricular hypertrophy, moderate left atrial enlargement, mild mitral regurgitation. Patient underwent atrial fibrillation ablation August 2023.  TEE prior to ablation showed ejection fraction 40 to 45%, mild RV dysfunction, moderate left atrial enlargement, mild to moderate right atrial enlargement, mild mitral regurgitation.  Patient separately seen in the emergency room August 28 with recurrent atrial fibrillation.  Patient's INR noted to be subtherapeutic at 1.6.  Since last seen, he denies dyspnea, exertional chest pain or syncope.  He has had occasional palpitations consistent with atrial fibrillation.  Over the past 5 to 6 days he has had problems with general fatigue, nausea and diarrhea.  He feels "gas" in his abdomen but denies fevers, chills, productive cough, hemoptysis.  Current Outpatient Medications  Medication Sig Dispense Refill   benazepril (LOTENSIN) 20 MG tablet Take 1 tablet (20 mg total) by mouth daily. 60 tablet 1   cetirizine (ZYRTEC) 10 MG tablet Take 10 mg by mouth daily.     diltiazem (CARDIZEM CD) 180 MG 24 hr capsule TAKE 1 CAPSULE BY MOUTH  DAILY 90 capsule 3   fluticasone (FLONASE) 50 MCG/ACT nasal spray Place 2 sprays into both nostrils daily. 16 g 5   hydrochlorothiazide (HYDRODIURIL) 12.5 MG tablet TAKE 1 TABLET BY MOUTH  DAILY 90 tablet 3   metFORMIN (GLUCOPHAGE-XR) 500 MG 24 hr tablet TAKE 2 TABLETS BY MOUTH  BEFORE BREAKFAST AND 1  TABLET BY MOUTH WITH SUPPER 270 tablet 3   metoprolol succinate (TOPROL-XL) 100 MG 24 hr tablet TAKE 1 TABLET BY MOUTH  DAILY WITH OR IMMEDIATELY  FOLLOWING A MEAL 90 tablet 3   Multiple Vitamin (MULTIVITAMIN) tablet Take 1 tablet by mouth daily.      Omega-3 Fatty Acids (FISH OIL) 1200 MG CAPS Take 2 capsules (2,400 mg total) by mouth 2 (two) times daily.     oxyCODONE-acetaminophen (PERCOCET/ROXICET) 5-325 MG tablet Take 1 tablet by mouth every 6 (six) hours as needed for severe pain. 15 tablet 0   pantoprazole (PROTONIX) 40 MG tablet Take 1 tablet (40 mg total) by mouth daily. 45 tablet 0   pravastatin (PRAVACHOL) 40 MG tablet Take 1 tablet (40 mg total) by mouth daily. 90 tablet 3   sildenafil (VIAGRA) 100 MG tablet TAKE 1 TABLET BY MOUTH ONCE DAILY AS NEEDED FOR ERECTILE DYSFUNCTION 10 tablet 6   warfarin (COUMADIN) 10 MG tablet TAKE 0.5-1 TABLET BY  MOUTH DAILY AS DIRECTED BY  COUMADIN CLINIC (Patient taking differently: Take 5-10 mg by mouth See admin instructions. TAKE 0.5-1 TABLET BY  MOUTH DAILY AS DIRECTED BY COUMADIN CLINIC; 10 mg on Mon and Fri, 5 mg all other days) 60 tablet 1   Dulaglutide (TRULICITY) 0.75 MG/0.5ML SOPN Inject 0.75 mg into the skin once a week. (Patient not taking: Reported on 02/03/2022) 2 mL 2   methocarbamol (ROBAXIN-750) 750 MG tablet Take 1 tablet (750 mg total) by mouth 4 (four) times daily. 30 tablet 0   predniSONE (DELTASONE) 10 MG tablet 3 tabs x3 days and then 2 tabs x3 days and then 1 tab x3 days.  Take w/ food. 18 tablet 0   No current facility-administered medications for this visit.     Past Medical History:  Diagnosis Date   DM II (diabetes mellitus,  type II), controlled (HCC)    Erectile dysfunction    Hyperlipidemia    Hypertension    Obesity    OSA (obstructive sleep apnea)    Paroxysmal atrial fibrillation (HCC)     Past Surgical History:  Procedure Laterality Date   ATRIAL FIBRILLATION ABLATION N/A 01/02/2022   Procedure: ATRIAL FIBRILLATION ABLATION;  Surgeon: Lanier Prude, MD;  Location: [facility] INVASIVE CV LAB;  Service: Cardiovascular;  Laterality: N/A;   CARDIAC CATHETERIZATION     CARDIOVERSION N/A 09/10/2020   Procedure: CARDIOVERSION;  Surgeon: Lewayne Bunting, MD;   Location: [facility] ENDOSCOPY;  Service: Cardiovascular;  Laterality: N/A;   FINGER SURGERY     left 1st finger   TEE WITHOUT CARDIOVERSION  01/02/2022   Procedure: TRANSESOPHAGEAL ECHOCARDIOGRAM (TEE);  Surgeon: Lanier Prude, MD;  Location: [facility] INVASIVE CV LAB;  Service: Cardiovascular;;    Social History   Socioeconomic History   Marital status: Married    Spouse name: Not on file   Number of children: Not on file   Years of education: Not on file   Highest education level: Not on file  Occupational History   Not on file  Tobacco Use   Smoking status: Former    Types: Cigarettes    Quit date: 05/26/1994    Years since quitting: 27.7   Smokeless tobacco: Never   Tobacco comments:    Former smoker  quit in 1995 01/30/22  Vaping Use   Vaping Use: Never used  Substance and Sexual Activity   Alcohol use: Not Currently    Alcohol/week: 5.0 standard drinks of alcohol    Types: 5 Cans of beer per week   Drug use: No   Sexual activity: Yes  Other Topics Concern   Not on file  Social History Narrative   Not on file   Social Determinants of Health   Financial Resource Strain: Not on file  Food Insecurity: Not on file  Transportation Needs: Not on file  Physical Activity: Not on file  Stress: Not on file  Social Connections: Not on file  Intimate Partner Violence: Not on file    Family History  Problem Relation Age of Onset   Heart attack Paternal Uncle    Heart attack Maternal Grandmother    Heart disease Mother    Hypertension Mother    Diabetes Mother    Alcohol abuse Father    Cancer Neg Hx    Early death Neg Hx    Hyperlipidemia Neg Hx    Kidney disease Neg Hx    Stroke Neg Hx     ROS: no fevers or chills, productive cough, hemoptysis, dysphasia, odynophagia, melena, hematochezia, dysuria, hematuria, rash, seizure activity, orthopnea, PND, pedal edema, claudication. Remaining systems are negative.  Physical Exam: Well-developed well-nourished in no acute  distress.  Skin is warm and dry.  HEENT is normal.  Neck is supple.  Chest is clear to auscultation with normal expansion.  Cardiovascular exam is regular rate and rhythm.  Abdominal exam nontender or distended. No masses palpated. Extremities show no edema. neuro grossly intact  ECG-January 30, 2022-sinus rhythm with nonspecific ST changes.  Personally reviewed  A/P  1 paroxysmal atrial fibrillation-patient is in sinus rhythm today.  Continue Cardizem, Toprol and Coumadin.  He has had occasional intermittent atrial fibrillation since his ablation.  Hopefully this will improve the further he gets away from his procedure.  He also notes nausea and diarrhea.  However he denies fevers, chills, productive cough, hemoptysis or  significant chest pain.  This does not appear to be a complication from his atrial fibrillation ablation.  I will check a CBC to evaluate his white blood cell count.  Question viral syndrome.  2 hypertension-patient's blood pressure appears to be controlled.  Continue present medical regimen.  3 obstructive sleep apnea-continue CPAP.  4 hyperlipidemia-continue statin.  5 LV dysfunction-noted on transesophageal echocardiogram at time of ablation.  Continue beta-blocker and ACE inhibitor.  We will repeat echocardiogram in 3 to 6 months now that sinus has been reestablished.  Olga Millers, MD

## 2022-01-22 ENCOUNTER — Encounter: Payer: Self-pay | Admitting: Family Medicine

## 2022-01-22 ENCOUNTER — Other Ambulatory Visit (HOSPITAL_BASED_OUTPATIENT_CLINIC_OR_DEPARTMENT_OTHER): Payer: Self-pay

## 2022-01-22 ENCOUNTER — Ambulatory Visit (INDEPENDENT_AMBULATORY_CARE_PROVIDER_SITE_OTHER): Payer: 59 | Admitting: Family Medicine

## 2022-01-22 VITALS — BP 118/86 | HR 81 | Temp 97.0°F | Resp 16 | Ht 69.5 in | Wt 258.4 lb

## 2022-01-22 DIAGNOSIS — Z125 Encounter for screening for malignant neoplasm of prostate: Secondary | ICD-10-CM

## 2022-01-22 DIAGNOSIS — Z0000 Encounter for general adult medical examination without abnormal findings: Secondary | ICD-10-CM | POA: Diagnosis not present

## 2022-01-22 DIAGNOSIS — M5412 Radiculopathy, cervical region: Secondary | ICD-10-CM

## 2022-01-22 DIAGNOSIS — E119 Type 2 diabetes mellitus without complications: Secondary | ICD-10-CM

## 2022-01-22 MED ORDER — PREDNISONE 10 MG PO TABS
ORAL_TABLET | ORAL | 0 refills | Status: DC
  Filled 2022-01-22: qty 18, 9d supply, fill #0

## 2022-01-22 NOTE — Progress Notes (Unsigned)
Subjective:    Patient ID: Daniel Grant, male    DOB: July 26, 1965, 56 y.o.   MRN: [id number]  HPI CPE- UTD on eye exam, foot exam, colonoscopy.  Due for Tdap, flu.  Patient Care Team    Relationship Specialty Notifications Start End  Sheliah Hatch, MD PCP - General Family Medicine  11/28/15   Lanier Prude, MD PCP - Electrophysiology Cardiology  03/29/21   Herby Abraham, MD  Cardiology  04/25/11   Karie Soda, MD Consulting Physician General Surgery  04/21/16   Lewayne Bunting, MD Consulting Physician Cardiology  04/21/16     Health Maintenance  Topic Date Due   Zoster Vaccines- Shingrix (2 of 2) 04/24/2022 (Originally 11/29/2021)   INFLUENZA VACCINE  08/24/2022 (Originally 12/24/2021)   TETANUS/TDAP  10/05/2022 (Originally 09/07/2021)   HEMOGLOBIN A1C  04/06/2022   OPHTHALMOLOGY EXAM  10/24/2022   FOOT EXAM  01/23/2023   COLONOSCOPY (Pts 45-40yrs Insurance coverage will need to be confirmed)  05/05/2024   Hepatitis C Screening  Completed   HIV Screening  Completed   HPV VACCINES  Aged Out   COVID-19 Vaccine  Discontinued      Review of Systems Patient reports no vision/hearing changes, anorexia, fever ,adenopathy, persistant/recurrent hoarseness, swallowing issues, chest pain, edema, persistant/recurrent cough, hemoptysis, gastrointestinal  bleeding (melena, rectal bleeding), abdominal pain, excessive heart burn, GU symptoms (dysuria, hematuria, voiding/incontinence issues) syncope, focal weakness, memory loss, numbness & tingling, skin/hair/nail changes, depression, anxiety, abnormal bruising/bleeding.  R sided neck pain- sxs started after sleeping in recliner.  Went to ER on 8/28 and dx'd w/ Torticollis.  Pt reports pain is 'all in my head'.  Indicates that pain will start on R side of neck and radiate up to top of the head.  Still has difficulty turning head.  Taking Methocarbamol w/ some relief but when this wears off, pain and stiffness returns.  Pt reports head  is TTP and has a stinging sensation.    + SOB w/ Afib    Objective:   Physical Exam General Appearance:    Alert, cooperative, no distress, appears stated age, obese  Head:    Normocephalic, without obvious abnormality, atraumatic  Eyes:    PERRL, conjunctiva/corneas clear, EOM's intact, fundi    benign, both eyes       Ears:    Normal TM's and external ear canals, both ears  Nose:   Nares normal, septum midline, mucosa normal, no drainage   or sinus tenderness  Throat:   Lips, mucosa, and tongue normal; teeth and gums normal  Neck:   symmetrical, trachea midline, no adenopathy;  R sided trap spasm w/ limited range of rotational motion   thyroid:  No enlargement/tenderness/nodules  Back:     Symmetric, no curvature, ROM normal, no CVA tenderness  Lungs:     Clear to auscultation bilaterally, respirations unlabored  Chest wall:    No tenderness or deformity  Heart:    Regular rate and rhythm, S1 and S2 normal, no murmur, rub   or gallop  Abdomen:     Soft, non-tender, bowel sounds active all four quadrants,    no masses, no organomegaly  Genitalia:    Normal male without lesion, masses,discharge or tenderness  Rectal:    Deferred due to young age  Extremities:   Extremities normal, atraumatic, no cyanosis or edema  Pulses:   2+ and symmetric all extremities  Skin:   Skin color, texture, turgor normal, no rashes or lesions  Lymph nodes:   Cervical, supraclavicular, and axillary nodes normal  Neurologic:   CNII-XII intact. Normal strength, sensation and reflexes      throughout          Assessment & Plan:   Cervical radicular pain- new.  Pt w/ obvious trap spasm on R.  The shooting pain he is describing running up his head is consistent w/ nerve irritation.  Methocarbamol is providing some relief but continues to have very limited range of motion and pain.  Will add prednisone taper but cautioned him that this will elevate his sugars and he needs to be very careful with what he is  eating.  Pt expressed understanding and is in agreement w/ plan.

## 2022-01-22 NOTE — Patient Instructions (Addendum)
Follow up in 3-4 months to recheck sugar We'll notify you of your lab results and make any changes if needed START the Prednisone as directed- take w/ food CONTINUE the Methocarbamol for pain Use the heating pad for pain relief Call with any questions or concerns Stay Safe!  Stay Healthy! Hang in there!!!

## 2022-01-23 ENCOUNTER — Other Ambulatory Visit (HOSPITAL_BASED_OUTPATIENT_CLINIC_OR_DEPARTMENT_OTHER): Payer: Self-pay

## 2022-01-23 ENCOUNTER — Other Ambulatory Visit (HOSPITAL_COMMUNITY): Payer: Self-pay

## 2022-01-23 ENCOUNTER — Encounter: Payer: Self-pay | Admitting: Family Medicine

## 2022-01-23 LAB — CBC WITH DIFFERENTIAL/PLATELET
Basophils Absolute: 0.1 10*3/uL (ref 0.0–0.1)
Basophils Relative: 0.9 % (ref 0.0–3.0)
Eosinophils Absolute: 1.5 10*3/uL — ABNORMAL HIGH (ref 0.0–0.7)
Eosinophils Relative: 11.8 % — ABNORMAL HIGH (ref 0.0–5.0)
HCT: 43.7 % (ref 39.0–52.0)
Hemoglobin: 14.8 g/dL (ref 13.0–17.0)
Lymphocytes Relative: 17.7 % (ref 12.0–46.0)
Lymphs Abs: 2.2 10*3/uL (ref 0.7–4.0)
MCHC: 33.9 g/dL (ref 30.0–36.0)
MCV: 84.3 fl (ref 78.0–100.0)
Monocytes Absolute: 0.9 10*3/uL (ref 0.1–1.0)
Monocytes Relative: 7.2 % (ref 3.0–12.0)
Neutro Abs: 7.8 10*3/uL — ABNORMAL HIGH (ref 1.4–7.7)
Neutrophils Relative %: 62.4 % (ref 43.0–77.0)
Platelets: 201 10*3/uL (ref 150.0–400.0)
RBC: 5.18 Mil/uL (ref 4.22–5.81)
RDW: 13.4 % (ref 11.5–15.5)
WBC: 12.5 10*3/uL — ABNORMAL HIGH (ref 4.0–10.5)

## 2022-01-23 LAB — BASIC METABOLIC PANEL
BUN: 19 mg/dL (ref 6–23)
CO2: 28 mEq/L (ref 19–32)
Calcium: 9.6 mg/dL (ref 8.4–10.5)
Chloride: 98 mEq/L (ref 96–112)
Creatinine, Ser: 0.92 mg/dL (ref 0.40–1.50)
GFR: 91.75 mL/min (ref >60.00)
Glucose, Bld: 276 mg/dL — ABNORMAL HIGH (ref 70–99)
Potassium: 4.4 mEq/L (ref 3.5–5.1)
Sodium: 135 mEq/L (ref 135–145)

## 2022-01-23 LAB — HEPATIC FUNCTION PANEL
ALT: 16 U/L (ref 0–53)
AST: 14 U/L (ref 0–37)
Albumin: 3.9 g/dL (ref 3.5–5.2)
Alkaline Phosphatase: 108 U/L (ref 39–117)
Bilirubin, Direct: 0.1 mg/dL (ref 0.0–0.3)
Total Bilirubin: 0.6 mg/dL (ref 0.2–1.2)
Total Protein: 7.4 g/dL (ref 6.0–8.3)

## 2022-01-23 LAB — TSH: TSH: 0.72 u[IU]/mL (ref 0.35–5.50)

## 2022-01-23 LAB — LIPID PANEL
Cholesterol: 119 mg/dL (ref 0–200)
HDL: 44.6 mg/dL (ref >39.00)
LDL Cholesterol: 47 mg/dL (ref 0–99)
NonHDL: 74.48
Total CHOL/HDL Ratio: 3
Triglycerides: 137 mg/dL (ref 0.0–149.0)
VLDL: 27.4 mg/dL (ref 0.0–40.0)

## 2022-01-23 LAB — HEMOGLOBIN A1C: Hgb A1c MFr Bld: 9 % — ABNORMAL HIGH (ref 4.6–6.5)

## 2022-01-23 MED ORDER — TRULICITY 0.75 MG/0.5ML ~~LOC~~ SOAJ
0.7500 mg | SUBCUTANEOUS | 2 refills | Status: DC
  Filled 2022-01-23 (×2): qty 2, 28d supply, fill #0

## 2022-01-23 NOTE — Assessment & Plan Note (Signed)
Pt's PE WNL w/ exception of obesity and torticollis.  UTD on colonoscopy.  Due for Tdap and flu but wants to hold off until neck is feeling better.  Check labs.  Anticipatory guidance provided.

## 2022-01-23 NOTE — Progress Notes (Signed)
Pt seen results via my chart . Trulicity has been sent to pharmacy

## 2022-01-23 NOTE — Assessment & Plan Note (Signed)
Chronic problem.  Pt has had unexplained weight loss which is concerning for elevated A1C.  UTD on eye exam, foot exam.  Check labs.  Adjust meds prn

## 2022-01-29 ENCOUNTER — Encounter: Payer: Self-pay | Admitting: Family Medicine

## 2022-01-29 ENCOUNTER — Ambulatory Visit (INDEPENDENT_AMBULATORY_CARE_PROVIDER_SITE_OTHER): Payer: 59

## 2022-01-29 DIAGNOSIS — Z5181 Encounter for therapeutic drug level monitoring: Secondary | ICD-10-CM | POA: Diagnosis not present

## 2022-01-29 DIAGNOSIS — I480 Paroxysmal atrial fibrillation: Secondary | ICD-10-CM

## 2022-01-29 LAB — POCT INR: INR: 1.3 — AB (ref 2.0–3.0)

## 2022-01-29 NOTE — Patient Instructions (Signed)
Description   Spoke to pt's wife, on DPR, and instructed pt to take 1 tablet today then START taking warfarin 1/2 tablet daily except for 1 tablet on Mondays, Thursdays, and Fridays.  Recheck INR in 1 week (normally 2 weeks). *Please discuss DOAc option with spouse during next INR check - note on file to discuss after insurance change*  Holding off for now.

## 2022-01-30 ENCOUNTER — Ambulatory Visit (HOSPITAL_COMMUNITY): Payer: 59 | Admitting: Nurse Practitioner

## 2022-01-30 ENCOUNTER — Ambulatory Visit (HOSPITAL_COMMUNITY)
Admission: RE | Admit: 2022-01-30 | Discharge: 2022-01-30 | Disposition: A | Discharge status: Home / Self Care | Payer: 59 | Source: Ambulatory Visit | Attending: Nurse Practitioner | Admitting: Nurse Practitioner

## 2022-01-30 ENCOUNTER — Other Ambulatory Visit: Payer: Self-pay

## 2022-01-30 ENCOUNTER — Encounter (HOSPITAL_COMMUNITY): Payer: Self-pay | Admitting: Physician Assistant

## 2022-01-30 VITALS — BP 122/84 | HR 66 | Ht 69.5 in | Wt 249.0 lb

## 2022-01-30 DIAGNOSIS — I4819 Other persistent atrial fibrillation: Secondary | ICD-10-CM | POA: Diagnosis present

## 2022-01-30 DIAGNOSIS — Z6836 Body mass index (BMI) 36.0-36.9, adult: Secondary | ICD-10-CM | POA: Insufficient documentation

## 2022-01-30 DIAGNOSIS — I10 Essential (primary) hypertension: Secondary | ICD-10-CM | POA: Diagnosis not present

## 2022-01-30 DIAGNOSIS — G4733 Obstructive sleep apnea (adult) (pediatric): Secondary | ICD-10-CM | POA: Diagnosis not present

## 2022-01-30 DIAGNOSIS — E669 Obesity, unspecified: Secondary | ICD-10-CM | POA: Insufficient documentation

## 2022-01-30 DIAGNOSIS — D6869 Other thrombophilia: Secondary | ICD-10-CM | POA: Diagnosis not present

## 2022-01-30 DIAGNOSIS — Z79899 Other long term (current) drug therapy: Secondary | ICD-10-CM | POA: Insufficient documentation

## 2022-01-30 DIAGNOSIS — E785 Hyperlipidemia, unspecified: Secondary | ICD-10-CM | POA: Insufficient documentation

## 2022-01-30 DIAGNOSIS — Z7901 Long term (current) use of anticoagulants: Secondary | ICD-10-CM | POA: Diagnosis not present

## 2022-01-30 DIAGNOSIS — E119 Type 2 diabetes mellitus without complications: Secondary | ICD-10-CM | POA: Diagnosis not present

## 2022-01-30 NOTE — Progress Notes (Signed)
Primary Care Physician: Sheliah Hatch, MD Primary Cardiologist: Dr Jens Som Primary Electrophysiologist: Dr Lalla Brothers Referring Physician: Dr Barbarann Ehlers is a 56 y.o. male with a history of DM, HTN, HLD, OSA, atrial fibrillation who presents for follow up in the [facility]. Patient is on warfarin for a CHADS2VASC score of 3. Patient is s/p afib ablation with Dr Lalla Brothers on 01/02/22. TEE at the time of ablation showed decreased systolic function with EF 40-45%.  On follow up today, patient was seen at the ED with neck pain and found to be back in rapid afib. Neck pain was felt to be trap spasm. He was discharged from the ED in afib. Fortunately, patient has converted back to SR. He reports that since the ablation, he has been in SR the vast majority of the time and has more energy, especially with exertion. His INR was subtherapeutic at the ED, his warfarin has been adjusted and will be rechecked next week.   Today, he denies symptoms of palpitations, chest pain, shortness of breath, orthopnea, PND, lower extremity edema, dizziness, presyncope, syncope, snoring, daytime somnolence, bleeding, or neurologic sequela. The patient is tolerating medications without difficulties and is otherwise without complaint today.    Atrial Fibrillation Risk Factors:  he does have symptoms or diagnosis of sleep apnea. he is compliant with CPAP therapy. he does not have a history of rheumatic fever.   he has a BMI of Body mass index is 36.24 kg/m.Marland Kitchen Filed Weights   01/30/22 1408  Weight: 112.9 kg    Family History  Problem Relation Age of Onset   Heart attack Paternal Uncle    Heart attack Maternal Grandmother    Heart disease Mother    Hypertension Mother    Diabetes Mother    Alcohol abuse Father    Cancer Neg Hx    Early death Neg Hx    Hyperlipidemia Neg Hx    Kidney disease Neg Hx    Stroke Neg Hx      Atrial Fibrillation Management  history:  Previous antiarrhythmic drugs: none Previous cardioversions: 2022 Previous ablations: 01/02/22 CHADS2VASC score: 3 Anticoagulation history: warfarin    Past Medical History:  Diagnosis Date   DM II (diabetes mellitus, type II), controlled (HCC)    Erectile dysfunction    Hyperlipidemia    Hypertension    Obesity    OSA (obstructive sleep apnea)    Paroxysmal atrial fibrillation (HCC)    Past Surgical History:  Procedure Laterality Date   ATRIAL FIBRILLATION ABLATION N/A 01/02/2022   Procedure: ATRIAL FIBRILLATION ABLATION;  Surgeon: Lanier Prude, MD;  Location: [facility] INVASIVE CV LAB;  Service: Cardiovascular;  Laterality: N/A;   CARDIAC CATHETERIZATION     CARDIOVERSION N/A 09/10/2020   Procedure: CARDIOVERSION;  Surgeon: Lewayne Bunting, MD;  Location: [facility] ENDOSCOPY;  Service: Cardiovascular;  Laterality: N/A;   FINGER SURGERY     left 1st finger   TEE WITHOUT CARDIOVERSION  01/02/2022   Procedure: TRANSESOPHAGEAL ECHOCARDIOGRAM (TEE);  Surgeon: Lanier Prude, MD;  Location: [facility] INVASIVE CV LAB;  Service: Cardiovascular;;    Current Outpatient Medications  Medication Sig Dispense Refill   benazepril (LOTENSIN) 20 MG tablet Take 1 tablet (20 mg total) by mouth daily. 60 tablet 1   cetirizine (ZYRTEC) 10 MG tablet Take 10 mg by mouth daily.     diltiazem (CARDIZEM CD) 180 MG 24 hr capsule TAKE 1 CAPSULE BY MOUTH  DAILY 90 capsule 3  Dulaglutide (TRULICITY) 0.75 MG/0.5ML SOPN Inject 0.75 mg into the skin once a week. 2 mL 2   fluticasone (FLONASE) 50 MCG/ACT nasal spray Place 2 sprays into both nostrils daily. 16 g 5   hydrochlorothiazide (HYDRODIURIL) 12.5 MG tablet TAKE 1 TABLET BY MOUTH  DAILY 90 tablet 3   metFORMIN (GLUCOPHAGE-XR) 500 MG 24 hr tablet TAKE 2 TABLETS BY MOUTH  BEFORE BREAKFAST AND 1  TABLET BY MOUTH WITH SUPPER 270 tablet 3   methocarbamol (ROBAXIN-750) 750 MG tablet Take 1 tablet (750 mg total) by mouth 4 (four) times daily. 30 tablet 0    metoprolol succinate (TOPROL-XL) 100 MG 24 hr tablet TAKE 1 TABLET BY MOUTH  DAILY WITH OR IMMEDIATELY  FOLLOWING A MEAL 90 tablet 3   Multiple Vitamin (MULTIVITAMIN) tablet Take 1 tablet by mouth daily.     Omega-3 Fatty Acids (FISH OIL) 1200 MG CAPS Take 2 capsules (2,400 mg total) by mouth 2 (two) times daily.     oxyCODONE-acetaminophen (PERCOCET/ROXICET) 5-325 MG tablet Take 1 tablet by mouth every 6 (six) hours as needed for severe pain. 15 tablet 0   pantoprazole (PROTONIX) 40 MG tablet Take 1 tablet (40 mg total) by mouth daily. 45 tablet 0   pravastatin (PRAVACHOL) 40 MG tablet Take 1 tablet (40 mg total) by mouth daily. 90 tablet 3   predniSONE (DELTASONE) 10 MG tablet 3 tabs x3 days and then 2 tabs x3 days and then 1 tab x3 days.  Take w/ food. 18 tablet 0   sildenafil (VIAGRA) 100 MG tablet TAKE 1 TABLET BY MOUTH ONCE DAILY AS NEEDED FOR ERECTILE DYSFUNCTION 10 tablet 6   warfarin (COUMADIN) 10 MG tablet TAKE 0.5-1 TABLET BY  MOUTH DAILY AS DIRECTED BY  COUMADIN CLINIC (Patient taking differently: Take 5-10 mg by mouth See admin instructions. TAKE 0.5-1 TABLET BY  MOUTH DAILY AS DIRECTED BY COUMADIN CLINIC; 10 mg on Mon and Fri, 5 mg all other days) 60 tablet 1   No current facility-administered medications for this encounter.    No Known Allergies  Social History   Socioeconomic History   Marital status: Married    Spouse name: Not on file   Number of children: Not on file   Years of education: Not on file   Highest education level: Not on file  Occupational History   Not on file  Tobacco Use   Smoking status: Former    Types: Cigarettes    Quit date: 05/26/1994    Years since quitting: 27.7   Smokeless tobacco: Never   Tobacco comments:    Former smoker  quit in 1995 01/30/22  Vaping Use   Vaping Use: Never used  Substance and Sexual Activity   Alcohol use: Not Currently    Alcohol/week: 5.0 standard drinks of alcohol    Types: 5 Cans of beer per week   Drug use: No    Sexual activity: Yes  Other Topics Concern   Not on file  Social History Narrative   Not on file   Social Determinants of Health   Financial Resource Strain: Not on file  Food Insecurity: Not on file  Transportation Needs: Not on file  Physical Activity: Not on file  Stress: Not on file  Social Connections: Not on file  Intimate Partner Violence: Not on file     ROS- All systems are reviewed and negative except as per the HPI above.  Physical Exam: Vitals:   01/30/22 1408  BP: 122/84  Pulse: 66  Weight: 112.9 kg  Height: 5' 9.5" (1.765 m)    GEN- The patient is a well appearing obese male, alert and oriented x 3 today.   Head- normocephalic, atraumatic Eyes-  Sclera clear, conjunctiva pink Ears- hearing intact Oropharynx- clear Neck- supple  Lungs- Clear to ausculation bilaterally, normal work of breathing Heart- Regular rate and rhythm, no murmurs, rubs or gallops  GI- soft, NT, ND, + BS Extremities- no clubbing, cyanosis, or edema MS- no significant deformity or atrophy Skin- no rash or lesion Psych- euthymic mood, full affect Neuro- strength and sensation are intact  Wt Readings from Last 3 Encounters:  01/30/22 112.9 kg  01/22/22 117.2 kg  01/20/22 121.6 kg    EKG today demonstrates  SR Vent. rate 66 BPM PR interval 176 ms QRS duration 86 ms QT/QTcB 378/396 ms  Echo 08/09/20 demonstrated  1. Pt in atrial fibrillation during the study.   2. Left ventricular ejection fraction, by estimation, is 55 to 60%. The  left ventricle has normal function. The left ventricle has no regional  wall motion abnormalities. There is moderate left ventricular hypertrophy. Left ventricular diastolic function could not be evaluated.   3. Right ventricular systolic function is normal. The right ventricular  size is normal. Tricuspid regurgitation signal is inadequate for assessing PA pressure.   4. Left atrial size was moderately dilated.   5. The mitral valve is  normal in structure. Mild mitral valve  regurgitation. No evidence of mitral stenosis.   6. The aortic valve is tricuspid. Aortic valve regurgitation is not  visualized. No aortic stenosis is present.   7. The inferior vena cava is normal in size with greater than 50%  respiratory variability, suggesting right atrial pressure of 3 mmHg.   Epic records are reviewed at length today  CHA2DS2-VASc Score = 3  The patient's score is based upon: CHF History: 1 HTN History: 1 Diabetes History: 1 Stroke History: 0 Vascular Disease History: 0 Age Score: 0 Gender Score: 0       ASSESSMENT AND PLAN: 1. Persistent Atrial Fibrillation (ICD10:  I48.19) The patient's CHA2DS2-VASc score is 3, indicating a 3.2% annual risk of stroke.   S/p afib ablation 01/20/22 Patient back in SR. Patient reports that he has been in SR the majority of the time. His wife is a Engineer, civil (consulting) and checks his rhythm daily. We discussed smart device technology for home monitoring.  Continue diltiazem 180 mg daily Continue Toprol 100 mg daily Continue warfarin  2. Secondary Hypercoagulable State (ICD10:  D68.69) The patient is at significant risk for stroke/thromboembolism based upon his CHA2DS2-VASc Score of 3.  Continue Warfarin (Coumadin).   3. Obesity Body mass index is 36.24 kg/m. Lifestyle modification was discussed at length including regular exercise and weight reduction.  4. Obstructive sleep apnea Encouraged compliance with CPAP therapy.  5. HTN Stable, no changes today.  6. Systolic dysfunction EF 40-45% on TEE at time of ablation. Fluid status appears stable today. Will likely need repeat echo to reassess post ablation.    Follow up with Dr Jens Som and Dr Lalla Brothers as scheduled.    Jorja Loa PA-C [facility] [address] [place], [place] [address] [phone] 01/30/2022 2:25 PM

## 2022-02-03 ENCOUNTER — Encounter: Payer: Self-pay | Admitting: *Deleted

## 2022-02-03 ENCOUNTER — Encounter: Payer: Self-pay | Admitting: Cardiology

## 2022-02-03 ENCOUNTER — Ambulatory Visit: Payer: 59 | Attending: Cardiology | Admitting: Cardiology

## 2022-02-03 VITALS — BP 110/62 | HR 79 | Ht 70.0 in | Wt 247.2 lb

## 2022-02-03 DIAGNOSIS — I4819 Other persistent atrial fibrillation: Secondary | ICD-10-CM | POA: Diagnosis not present

## 2022-02-03 DIAGNOSIS — I10 Essential (primary) hypertension: Secondary | ICD-10-CM | POA: Diagnosis not present

## 2022-02-03 DIAGNOSIS — I480 Paroxysmal atrial fibrillation: Secondary | ICD-10-CM

## 2022-02-03 DIAGNOSIS — D6869 Other thrombophilia: Secondary | ICD-10-CM | POA: Diagnosis not present

## 2022-02-03 NOTE — Patient Instructions (Signed)
  Follow-Up: At [facility], you and your health needs are our priority.  As part of our continuing mission to provide you with exceptional heart care, we have created designated Provider Care Teams.  These Care Teams include your primary Cardiologist (physician) and Advanced Practice Providers (APPs -  Physician Assistants and Nurse Practitioners) who all work together to provide you with the care you need, when you need it.  We recommend signing up for the patient portal called "MyChart".  Sign up information is provided on this After Visit Summary.  MyChart is used to connect with patients for Virtual Visits (Telemedicine).  Patients are able to view lab/test results, encounter notes, upcoming appointments, etc.  Non-urgent messages can be sent to your provider as well.   To learn more about what you can do with MyChart, go to [email or web address].    Your next appointment:   6 week(s)  The format for your next appointment:   In Person  Provider:    Olga Millers MD

## 2022-02-04 LAB — CBC
Hematocrit: 49.8 % (ref 37.5–51.0)
Hemoglobin: 17.1 g/dL (ref 13.0–17.7)
MCH: 28.7 pg (ref 26.6–33.0)
MCHC: 34.3 g/dL (ref 31.5–35.7)
MCV: 84 fL (ref 79–97)
Platelets: 286 10*3/uL (ref 150–450)
RBC: 5.95 x10E6/uL — ABNORMAL HIGH (ref 4.14–5.80)
RDW: 13.3 % (ref 11.6–15.4)
WBC: 12.8 10*3/uL — ABNORMAL HIGH (ref 3.4–10.8)

## 2022-02-05 LAB — POCT INR: INR: 2.2 (ref 2.0–3.0)

## 2022-02-06 ENCOUNTER — Ambulatory Visit (INDEPENDENT_AMBULATORY_CARE_PROVIDER_SITE_OTHER): Payer: 59

## 2022-02-06 DIAGNOSIS — Z5181 Encounter for therapeutic drug level monitoring: Secondary | ICD-10-CM

## 2022-02-06 DIAGNOSIS — I480 Paroxysmal atrial fibrillation: Secondary | ICD-10-CM

## 2022-02-06 NOTE — Patient Instructions (Signed)
Description   Spoke to pt's wife, on DPR, and instructed pt to continue taking warfarin 1/2 tablet daily except for 1 tablet on Mondays, Thursdays, and Fridays.  Recheck INR in 2 weeks. *Please discuss DOAc option with spouse during next INR check - note on file to discuss after insurance change*  Holding off for now.

## 2022-02-08 ENCOUNTER — Other Ambulatory Visit (HOSPITAL_COMMUNITY): Payer: Self-pay

## 2022-02-17 ENCOUNTER — Other Ambulatory Visit: Payer: Self-pay | Admitting: Cardiology

## 2022-02-19 ENCOUNTER — Ambulatory Visit (INDEPENDENT_AMBULATORY_CARE_PROVIDER_SITE_OTHER): Payer: 59

## 2022-02-19 DIAGNOSIS — Z5181 Encounter for therapeutic drug level monitoring: Secondary | ICD-10-CM | POA: Diagnosis not present

## 2022-02-19 DIAGNOSIS — I480 Paroxysmal atrial fibrillation: Secondary | ICD-10-CM | POA: Diagnosis not present

## 2022-02-19 LAB — POCT INR: INR: 1.4 — AB (ref 2.0–3.0)

## 2022-02-19 NOTE — Patient Instructions (Signed)
Description   Spoke to pt's wife, on DPR, and instructed to take 1 tablet today and then START taking 1 tablet daily EXCEPT 0.5 tablet on Sundays and Wednesdays.  Recheck INR in 1 week. *Please discuss DOAc option with spouse during next INR check - note on file to discuss after insurance change*  Holding off for now.

## 2022-02-24 ENCOUNTER — Ambulatory Visit: Payer: 59 | Attending: Nurse Practitioner | Admitting: Nurse Practitioner

## 2022-02-24 ENCOUNTER — Encounter: Payer: Self-pay | Admitting: Nurse Practitioner

## 2022-02-24 VITALS — BP 132/84 | HR 67 | Ht 70.0 in | Wt 259.8 lb

## 2022-02-24 DIAGNOSIS — E119 Type 2 diabetes mellitus without complications: Secondary | ICD-10-CM

## 2022-02-24 DIAGNOSIS — E782 Mixed hyperlipidemia: Secondary | ICD-10-CM | POA: Diagnosis not present

## 2022-02-24 DIAGNOSIS — I480 Paroxysmal atrial fibrillation: Secondary | ICD-10-CM

## 2022-02-24 DIAGNOSIS — I519 Heart disease, unspecified: Secondary | ICD-10-CM | POA: Diagnosis not present

## 2022-02-24 DIAGNOSIS — I10 Essential (primary) hypertension: Secondary | ICD-10-CM | POA: Diagnosis not present

## 2022-02-24 DIAGNOSIS — G4733 Obstructive sleep apnea (adult) (pediatric): Secondary | ICD-10-CM

## 2022-02-24 NOTE — Patient Instructions (Signed)
Medication Instructions:  Your physician recommends that you continue on your current medications as directed. Please refer to the Current Medication list given to you today.   *If you need a refill on your cardiac medications before your next appointment, please call your pharmacy*   Lab Work: NONE ordered at this time of appointment   If you have labs (blood work) drawn today and your tests are completely normal, you will receive your results only by: [place] (if you have MyChart) OR A paper copy in the mail If you have any lab test that is abnormal or we need to change your treatment, we will call you to review the results.   Testing/Procedures: Your physician has requested that you have an echocardiogram 04/2022. Echocardiography is a painless test that uses sound waves to create images of your heart. It provides your doctor with information about the size and shape of your heart and how well your heart's chambers and valves are working. This procedure takes approximately one hour. There are no restrictions for this procedure.    Follow-Up: At [facility], you and your health needs are our priority.  As part of our continuing mission to provide you with exceptional heart care, we have created designated Provider Care Teams.  These Care Teams include your primary Cardiologist (physician) and Advanced Practice Providers (APPs -  Physician Assistants and Nurse Practitioners) who all work together to provide you with the care you need, when you need it.  We recommend signing up for the patient portal called "MyChart".  Sign up information is provided on this After Visit Summary.  MyChart is used to connect with patients for Virtual Visits (Telemedicine).  Patients are able to view lab/test results, encounter notes, upcoming appointments, etc.  Non-urgent messages can be sent to your provider as well.   To learn more about what you can do with MyChart, go to  [email or web address].    Your next appointment:   4 month(s)  The format for your next appointment:   In Person  Provider:   Kirk Ruths, MD     Other Instructions   Important Information About Sugar

## 2022-02-24 NOTE — Progress Notes (Signed)
Office Visit    Patient Name: Daniel Grant Date of Encounter: 02/24/2022  Primary Care Provider:  Midge Minium, MD Primary Cardiologist:  Kirk Ruths, MD  Chief Complaint    56 year old male with a history of paroxysmal atrial fibrillation, LV dysfunction, hypertension, hyperlipidemia, OSA, and type 2 diabetes who presents for follow-up related to atrial fibrillation.   Past Medical History    Past Medical History:  Diagnosis Date   DM II (diabetes mellitus, type II), controlled ([place])    Erectile dysfunction    Hyperlipidemia    Hypertension    Obesity    OSA (obstructive sleep apnea)    Paroxysmal atrial fibrillation ([place])    Past Surgical History:  Procedure Laterality Date   ATRIAL FIBRILLATION ABLATION N/A 01/02/2022   Procedure: ATRIAL FIBRILLATION ABLATION;  Surgeon: Vickie Epley, MD;  Location: [place] [facility];  Service: Cardiovascular;  Laterality: N/A;   CARDIAC CATHETERIZATION     CARDIOVERSION N/A 09/10/2020   Procedure: CARDIOVERSION;  Surgeon: Lelon Perla, MD;  Location: [facility] ENDOSCOPY;  Service: Cardiovascular;  Laterality: N/A;   FINGER SURGERY     left 1st finger   TEE WITHOUT CARDIOVERSION  01/02/2022   Procedure: TRANSESOPHAGEAL ECHOCARDIOGRAM (TEE);  Surgeon: Vickie Epley, MD;  Location: [place] [facility] LAB;  Service: Cardiovascular;;    Allergies  No Known Allergies  History of Present Illness    56 year old male with the above past medical history including paroxysmal atrial fibrillation, LV dysfunction, hypertension, hyperlipidemia, OSA, and type 2 diabetes.  Cardiac catheterization in 2001 showed normal LV function, normal coronary arteries.  Stress test in June 2011 showed soft tissue attenuation, no evidence of ischemia.  Echocardiogram in March 2022 showed normal LV function, moderate LVH, moderate left atrial enlargement, mild mitral valve regurgitation.  He has a history of paroxysmal atrial fibrillation s/p  ablation in August 2023.  TEE prior to ablation showed EF 40 to 45%, mild RV dysfunction, moderate LAE, mild to moderate RAE, mild mitral valve regurgitation.  He was evaluated in the ED on 01/20/2022 with recurrent atrial fibrillation.  His INR was noted to be subtherapeutic at 1.6.  He was last seen in the office on 02/03/2022 and noted occasional intermittent atrial fibrillation since the time of his ablation though he was maintaining sinus rhythm at his visit.  Additionally, he reported nausea and diarrhea.  His symptoms were thought to be due to a viral syndrome.  Repeat echocardiogram was recommended in 3 to 6 months.  He presents today for follow-up. Since his last visit he has done well from a cardiac standpoint.  He denies any recent palpitations, pain, dyspnea, edema, PND, orthopnea, weight gain.  He is working full-time.  He denies bleeding on Coumadin.  He thinks that at his last visit he was ill with a virus.  He has since recovered.  Overall, he reports feeling well denies any additional concerns today.  Home Medications    Current Outpatient Medications  Medication Sig Dispense Refill   benazepril (LOTENSIN) 20 MG tablet Take 1 tablet (20 mg total) by mouth daily. 60 tablet 1   cetirizine (ZYRTEC) 10 MG tablet Take 10 mg by mouth daily.     diltiazem (CARDIZEM CD) 180 MG 24 hr capsule TAKE 1 CAPSULE BY MOUTH  DAILY 90 capsule 3   Dulaglutide (TRULICITY) A999333 0000000 SOPN Inject 0.75 mg into the skin once a week. 2 mL 2   fluticasone (FLONASE) 50 MCG/ACT nasal spray Place 2  sprays into both nostrils daily. 16 g 5   hydrochlorothiazide (HYDRODIURIL) 12.5 MG tablet TAKE 1 TABLET BY MOUTH  DAILY 90 tablet 3   metFORMIN (GLUCOPHAGE-XR) 500 MG 24 hr tablet TAKE 2 TABLETS BY MOUTH  BEFORE BREAKFAST AND 1  TABLET BY MOUTH WITH SUPPER 270 tablet 3   methocarbamol (ROBAXIN-750) 750 MG tablet Take 1 tablet (750 mg total) by mouth 4 (four) times daily. 30 tablet 0   metoprolol succinate (TOPROL-XL)  100 MG 24 hr tablet TAKE 1 TABLET BY MOUTH  DAILY WITH OR IMMEDIATELY  FOLLOWING A MEAL 90 tablet 3   Multiple Vitamin (MULTIVITAMIN) tablet Take 1 tablet by mouth daily.     Omega-3 Fatty Acids (FISH OIL) 1200 MG CAPS Take 2 capsules (2,400 mg total) by mouth 2 (two) times daily.     pravastatin (PRAVACHOL) 40 MG tablet Take 1 tablet (40 mg total) by mouth daily. 90 tablet 3   sildenafil (VIAGRA) 100 MG tablet TAKE 1 TABLET BY MOUTH ONCE DAILY AS NEEDED FOR ERECTILE DYSFUNCTION 10 tablet 6   warfarin (COUMADIN) 10 MG tablet TAKE 1/2 TO 1 TABLET BY MOUTH  DAILY AS DIRECTED BY COUMADIN  CLINIC 90 tablet 3   oxyCODONE-acetaminophen (PERCOCET/ROXICET) 5-325 MG tablet Take 1 tablet by mouth every 6 (six) hours as needed for severe pain. (Patient not taking: Reported on 02/24/2022) 15 tablet 0   pantoprazole (PROTONIX) 40 MG tablet Take 1 tablet (40 mg total) by mouth daily. 45 tablet 0   predniSONE (DELTASONE) 10 MG tablet 3 tabs x3 days and then 2 tabs x3 days and then 1 tab x3 days.  Take w/ food. (Patient not taking: Reported on 02/24/2022) 18 tablet 0   No current facility-administered medications for this visit.     Review of Systems    He denies chest pain, palpitations, dyspnea, pnd, orthopnea, n, v, dizziness, syncope, edema, weight gain, or early satiety. All other systems reviewed and are otherwise negative except as noted above.  Physical Exam    VS:  BP 132/84 (BP Location: Right Arm, Patient Position: Sitting, Cuff Size: Large)   Pulse 67   Ht 5\' 10"  (1.778 m)   Wt 259 lb 12.8 oz (117.8 kg)   SpO2 97%   BMI 37.28 kg/m  GEN: Well nourished, well developed, in no acute distress. HEENT: normal. Neck: Supple, no JVD, carotid bruits, or masses. Cardiac: RRR, no murmurs, rubs, or gallops. No clubbing, cyanosis, edema.  Radials/DP/PT 2+ and equal bilaterally.  Respiratory:  Respirations regular and unlabored, clear to auscultation bilaterally. GI: Soft, nontender, nondistended, BS + x  4. MS: no deformity or atrophy. Skin: warm and dry, no rash. Neuro:  Strength and sensation are intact. Psych: Normal affect.  Accessory Clinical Findings    ECG personally reviewed by me today -no EKG in office today.   Lab Results  Component Value Date   WBC 12.8 (H) 02/03/2022   HGB 17.1 02/03/2022   HCT 49.8 02/03/2022   MCV 84 02/03/2022   PLT 286 02/03/2022   Lab Results  Component Value Date   CREATININE 0.92 01/22/2022   BUN 19 01/22/2022   NA 135 01/22/2022   K 4.4 01/22/2022   CL 98 01/22/2022   CO2 28 01/22/2022   Lab Results  Component Value Date   ALT 16 01/22/2022   AST 14 01/22/2022   ALKPHOS 108 01/22/2022   BILITOT 0.6 01/22/2022   Lab Results  Component Value Date   CHOL 119 01/22/2022  HDL 44.60 01/22/2022   LDLCALC 47 01/22/2022   LDLDIRECT 80.0 12/15/2018   TRIG 137.0 01/22/2022   CHOLHDL 3 01/22/2022    Lab Results  Component Value Date   HGBA1C 9.0 (H) 01/22/2022    Assessment & Plan   1. Paroxysmal atrial fibrillation: S/p ablation in August 2023. Maintaining NSR.  Denies any recent palpitations.  Continue diltiazem, metoprolol, Coumadin.  Continue Coumadin, diltiazem, metoprolol.  2. LV dysfunction: Echo in March 2022 showed normal LV function, moderate LVH, moderate left atrial enlargement, mild mitral valve regurgitation. TEE prior to ablation showed EF 40 to 45%, mild RV dysfunction, moderate LAE, mild to moderate RAE, mild mitral valve regurgitation.  Euvolemic and well compensated on exam.  We will plan for repeat echo in December 2023.  Continue metoprolol, hydrochlorothiazide.  3. Hypertension: BP well controlled. Continue current antihypertensive regimen.   4. Hyperlipidemia: LDL was 47 in 12/2021. Continue pravastatin.  5. OSA: Adherent to CPAP.   6. Type 2 diabetes: A1c was 9.0 in 12/2021.  Monitored and managed per PCP.  7. Disposition: Follow-up as scheduled with Dr. Quentin Ore in 03/2022, follow-up with Dr. Stanford Breed in  06/2022.      Lenna Sciara, NP 02/24/2022, 9:53 AM

## 2022-02-26 ENCOUNTER — Ambulatory Visit (INDEPENDENT_AMBULATORY_CARE_PROVIDER_SITE_OTHER): Payer: 59

## 2022-02-26 DIAGNOSIS — Z5181 Encounter for therapeutic drug level monitoring: Secondary | ICD-10-CM

## 2022-02-26 DIAGNOSIS — I480 Paroxysmal atrial fibrillation: Secondary | ICD-10-CM | POA: Diagnosis not present

## 2022-02-26 LAB — POCT INR: INR: 1.9 — AB (ref 2.0–3.0)

## 2022-02-26 NOTE — Patient Instructions (Signed)
Description   Spoke to pt's wife, on DPR, and instructed to take 1 tablet today and then continue taking 1 tablet daily EXCEPT 0.5 tablet on Sundays and Wednesdays.  Recheck INR in 1 week. *Please discuss DOAc option with spouse during next INR check - note on file to discuss after insurance change*  Holding off for now.

## 2022-02-27 ENCOUNTER — Encounter: Payer: Self-pay | Admitting: *Deleted

## 2022-02-27 DIAGNOSIS — I480 Paroxysmal atrial fibrillation: Secondary | ICD-10-CM

## 2022-02-27 NOTE — Progress Notes (Signed)
This encounter was created in error - please disregard.

## 2022-03-05 LAB — POCT INR: INR: 1.9 — AB (ref 2.0–3.0)

## 2022-03-06 ENCOUNTER — Ambulatory Visit (INDEPENDENT_AMBULATORY_CARE_PROVIDER_SITE_OTHER): Payer: 59

## 2022-03-06 DIAGNOSIS — I480 Paroxysmal atrial fibrillation: Secondary | ICD-10-CM | POA: Diagnosis not present

## 2022-03-06 DIAGNOSIS — Z5181 Encounter for therapeutic drug level monitoring: Secondary | ICD-10-CM

## 2022-03-06 NOTE — Patient Instructions (Signed)
Description   Spoke to pt's wife, on DPR, and instructed to START taking 1 tablet daily EXCEPT 0.5 tablet on Wednesdays.  Recheck INR in 1 week. *Please discuss DOAc option with spouse during next INR check - note on file to discuss after insurance change*  Holding off for now.

## 2022-03-12 LAB — POCT INR: INR: 2.2 (ref 2.0–3.0)

## 2022-03-13 ENCOUNTER — Ambulatory Visit (INDEPENDENT_AMBULATORY_CARE_PROVIDER_SITE_OTHER): Payer: 59

## 2022-03-13 DIAGNOSIS — Z5181 Encounter for therapeutic drug level monitoring: Secondary | ICD-10-CM

## 2022-03-13 DIAGNOSIS — I480 Paroxysmal atrial fibrillation: Secondary | ICD-10-CM

## 2022-03-13 NOTE — Patient Instructions (Signed)
Description   Spoke to pt's wife, on DPR, and instructed to continue taking 1 tablet daily EXCEPT 0.5 tablet on Wednesdays.  Recheck INR in 2 weeks. *Please discuss DOAc option with spouse during next INR check - note on file to discuss after insurance change*  Holding off for now.

## 2022-03-26 ENCOUNTER — Ambulatory Visit (INDEPENDENT_AMBULATORY_CARE_PROVIDER_SITE_OTHER): Payer: 59

## 2022-03-26 DIAGNOSIS — Z5181 Encounter for therapeutic drug level monitoring: Secondary | ICD-10-CM | POA: Diagnosis not present

## 2022-03-26 DIAGNOSIS — I480 Paroxysmal atrial fibrillation: Secondary | ICD-10-CM

## 2022-03-26 LAB — POCT INR: INR: 1.8 — AB (ref 2.0–3.0)

## 2022-03-26 NOTE — Patient Instructions (Signed)
Description   Spoke to pt's wife, on DPR, and instructed for pt to take 1 tablet today and then continue taking 1 tablet daily EXCEPT 0.5 tablet on Wednesdays.  Recheck INR in 2 weeks. *Please discuss DOAc option with spouse during next INR check - note on file to discuss after insurance change*  Holding off for now.

## 2022-03-31 ENCOUNTER — Ambulatory Visit: Payer: 59 | Admitting: Cardiology

## 2022-04-08 ENCOUNTER — Ambulatory Visit: Payer: 59 | Attending: Cardiology | Admitting: Cardiology

## 2022-04-08 ENCOUNTER — Encounter: Payer: Self-pay | Admitting: Cardiology

## 2022-04-08 ENCOUNTER — Ambulatory Visit (HOSPITAL_BASED_OUTPATIENT_CLINIC_OR_DEPARTMENT_OTHER): Payer: 59

## 2022-04-08 VITALS — BP 132/84 | HR 101 | Ht 70.0 in | Wt 265.0 lb

## 2022-04-08 DIAGNOSIS — I480 Paroxysmal atrial fibrillation: Secondary | ICD-10-CM

## 2022-04-08 DIAGNOSIS — I4819 Other persistent atrial fibrillation: Secondary | ICD-10-CM | POA: Insufficient documentation

## 2022-04-08 DIAGNOSIS — I519 Heart disease, unspecified: Secondary | ICD-10-CM

## 2022-04-08 DIAGNOSIS — G4733 Obstructive sleep apnea (adult) (pediatric): Secondary | ICD-10-CM | POA: Insufficient documentation

## 2022-04-08 LAB — ECHOCARDIOGRAM COMPLETE
Area-P 1/2: 3.37 cm2
S' Lateral: 2.8 cm

## 2022-04-08 NOTE — Patient Instructions (Signed)
Medication Instructions:  No changes  *If you need a refill on your cardiac medications before your next appointment, please call your pharmacy*   Lab Work: None  If you have labs (blood work) drawn today and your tests are completely normal, you will receive your results only by: MyChart Message (if you have MyChart) OR A paper copy in the mail If you have any lab test that is abnormal or we need to change your treatment, we will call you to review the results.   Testing/Procedures: Your physician has recommended that you have a Cardioversion (DCCV). Electrical Cardioversion uses a jolt of electricity to your heart either through paddles or wired patches attached to your chest. This is a controlled, usually prescheduled, procedure. Defibrillation is done under light anesthesia in the hospital, and you usually go home the day of the procedure. This is done to get your heart back into a normal rhythm. You are not awake for the procedure. Please see the instruction sheet given to you today.    Follow-Up: At [facility], you and your health needs are our priority.  As part of our continuing mission to provide you with exceptional heart care, we have created designated Provider Care Teams.  These Care Teams include your primary Cardiologist (physician) and Advanced Practice Providers (APPs -  Physician Assistants and Nurse Practitioners) who all work together to provide you with the care you need, when you need it.  We recommend signing up for the patient portal called "MyChart".  Sign up information is provided on this After Visit Summary.  MyChart is used to connect with patients for Virtual Visits (Telemedicine).  Patients are able to view lab/test results, encounter notes, upcoming appointments, etc.  Non-urgent messages can be sent to your provider as well.   To learn more about what you can do with MyChart, go to [email or web address].    Your next appointment:   3  month(s)  The format for your next appointment:   In Person  Provider:   You will see one of the following Advanced Practice Providers on your designated Care Team:   Francis Dowse, Charlott Holler "[facility] [place], [place]      Other Instructions     Dear Daniel Grant  You are scheduled for a Cardioversion on Friday, December 15 with Dr. Jens Som.  Please arrive at the [facility] (Main Entrance A) at [facility]: [address] [place], [place] [address] at 12:30 PM.   DIET:  Nothing to eat or drink after midnight except a sip of water with medications (see medication instructions below)  MEDICATION INSTRUCTIONS: HOLD: Dulaglutide (Trulicity) for 1 week prior. Continue taking your anticoagulant (blood thinner): Warfarin (Coumadin).  You will need to continue this after your procedure until you are told by your provider that it is safe to stop.    LABS:   Your labs will be done at the hospital prior to your procedure - you will need to arrive 1 and 1/2 hours prior to your procedure.  You must have a responsible person to drive you home and stay in the waiting area during your procedure. Failure to do so could result in cancellation.  Bring your insurance cards.  *Special Note: Every effort is made to have your procedure done on time. Occasionally there are emergencies that occur at the hospital that may cause delays. Please be patient if a delay does occur.      Important Information About Sugar

## 2022-04-08 NOTE — Progress Notes (Signed)
Electrophysiology Office Follow up Visit Note:    Date:  04/08/2022   ID:  Daniel Grant, DOB 02/14/1966, MRN [id number]  PCP:  Sheliah Hatch, MD  [facility] Cardiologist:  Olga Millers, MD  [facility] Electrophysiologist:  Lanier Prude, MD    Interval History:    Daniel Grant is a 56 y.o. male who presents for a follow up visit. He underwent atrial fibrillation ablation in 12/2021. They were last seen in clinic 02/24/2022 by Bernadene Person, NP where he was doing well from a cardiac standpoint. No bleeding issues on Coumadin. Continued on metoprolol and HCTZ.  He had an echocardiogram performed earlier today.  Today, he reports that 3 weeks ago he noticed becoming fatigued easily when climbing the stairs at his home. This is a typical symptom associated with his Afib episodes.  He confirms that his INR was 1.9 most recently. He admits to missing 1 day of his coumadin  He denies any palpitations, chest pain, shortness of breath, or peripheral edema. No lightheadedness, headaches, syncope, orthopnea, or PND.      Past Medical History:  Diagnosis Date   DM II (diabetes mellitus, type II), controlled ([facility])    Erectile dysfunction    Hyperlipidemia    Hypertension    Obesity    OSA (obstructive sleep apnea)    Paroxysmal atrial fibrillation ([facility])     Past Surgical History:  Procedure Laterality Date   ATRIAL FIBRILLATION ABLATION N/A 01/02/2022   Procedure: ATRIAL FIBRILLATION ABLATION;  Surgeon: Lanier Prude, MD;  Location: [facility] INVASIVE CV LAB;  Service: Cardiovascular;  Laterality: N/A;   CARDIAC CATHETERIZATION     CARDIOVERSION N/A 09/10/2020   Procedure: CARDIOVERSION;  Surgeon: Lewayne Bunting, MD;  Location: [facility] ENDOSCOPY;  Service: Cardiovascular;  Laterality: N/A;   FINGER SURGERY     left 1st finger   TEE WITHOUT CARDIOVERSION  01/02/2022   Procedure: TRANSESOPHAGEAL ECHOCARDIOGRAM (TEE);  Surgeon: Lanier Prude, MD;  Location: [facility]  INVASIVE CV LAB;  Service: Cardiovascular;;    Current Medications: Current Meds  Medication Sig   benazepril (LOTENSIN) 20 MG tablet Take 1 tablet (20 mg total) by mouth daily.   cetirizine (ZYRTEC) 10 MG tablet Take 10 mg by mouth daily.   diltiazem (CARDIZEM CD) 180 MG 24 hr capsule TAKE 1 CAPSULE BY MOUTH  DAILY   fluticasone (FLONASE) 50 MCG/ACT nasal spray Place 2 sprays into both nostrils daily.   hydrochlorothiazide (HYDRODIURIL) 12.5 MG tablet TAKE 1 TABLET BY MOUTH  DAILY   metFORMIN (GLUCOPHAGE-XR) 500 MG 24 hr tablet TAKE 2 TABLETS BY MOUTH  BEFORE BREAKFAST AND 1  TABLET BY MOUTH WITH SUPPER   metoprolol succinate (TOPROL-XL) 100 MG 24 hr tablet TAKE 1 TABLET BY MOUTH  DAILY WITH OR IMMEDIATELY  FOLLOWING A MEAL   Multiple Vitamin (MULTIVITAMIN) tablet Take 1 tablet by mouth daily.   Omega-3 Fatty Acids (FISH OIL) 1200 MG CAPS Take 2 capsules (2,400 mg total) by mouth 2 (two) times daily.   pravastatin (PRAVACHOL) 40 MG tablet Take 1 tablet (40 mg total) by mouth daily.   sildenafil (VIAGRA) 100 MG tablet TAKE 1 TABLET BY MOUTH ONCE DAILY AS NEEDED FOR ERECTILE DYSFUNCTION   warfarin (COUMADIN) 10 MG tablet TAKE 1/2 TO 1 TABLET BY MOUTH  DAILY AS DIRECTED BY COUMADIN  CLINIC     Allergies:   Patient has no known allergies.   Social History   Socioeconomic History   Marital status: Married  Spouse name: Not on file   Number of children: Not on file   Years of education: Not on file   Highest education level: Not on file  Occupational History   Not on file  Tobacco Use   Smoking status: Former    Types: Cigarettes    Quit date: 05/26/1994    Years since quitting: 27.8   Smokeless tobacco: Never   Tobacco comments:    Former smoker  quit in 1995 01/30/22  Vaping Use   Vaping Use: Never used  Substance and Sexual Activity   Alcohol use: Not Currently    Alcohol/week: 5.0 standard drinks of alcohol    Types: 5 Cans of beer per week   Drug use: No   Sexual  activity: Yes  Other Topics Concern   Not on file  Social History Narrative   Not on file   Social Determinants of Health   Financial Resource Strain: Not on file  Food Insecurity: Not on file  Transportation Needs: Not on file  Physical Activity: Not on file  Stress: Not on file  Social Connections: Not on file     Family History: The patient's family history includes Alcohol abuse in his father; Diabetes in his mother; Heart attack in his maternal grandmother and paternal uncle; Heart disease in his mother; Hypertension in his mother. There is no history of Cancer, Early death, Hyperlipidemia, Kidney disease, or Stroke.  ROS:   Please see the history of present illness.    (+) Fatigue All other systems reviewed and are negative.  EKGs/Labs/Other Studies Reviewed:    The following studies were reviewed today:  Echo  04/08/2022: EF 60, rv normal. No MR.    EKG:  EKG is personally reviewed.  04/08/2022:  atrial fib/flutter w a rate of 101 bpm.   Recent Labs: 01/22/2022: ALT 16; BUN 19; Creatinine, Ser 0.92; Potassium 4.4; Sodium 135; TSH 0.72 02/03/2022: Hemoglobin 17.1; Platelets 286   Recent Lipid Panel    Component Value Date/Time   CHOL 119 01/22/2022 1431   CHOL 128 07/27/2020 1015   TRIG 137.0 01/22/2022 1431   HDL 44.60 01/22/2022 1431   HDL 33 (L) 07/27/2020 1015   CHOLHDL 3 01/22/2022 1431   VLDL 27.4 01/22/2022 1431   LDLCALC 47 01/22/2022 1431   LDLCALC 64 06/07/2021 1426   LDLDIRECT 80.0 12/15/2018 1359    Physical Exam:    VS:  BP 132/84   Pulse (!) 101   Ht 5\' 10"  (1.778 m)   Wt 265 lb (120.2 kg)   SpO2 97%   BMI 38.02 kg/m     Wt Readings from Last 3 Encounters:  04/08/22 265 lb (120.2 kg)  02/24/22 259 lb 12.8 oz (117.8 kg)  02/03/22 247 lb 3.2 oz (112.1 kg)     GEN: Well nourished, well developed in no acute distress HEENT: Normal NECK: No JVD; No carotid bruits LYMPHATICS: No lymphadenopathy CARDIAC: irregularly irregular.  no  murmurs, rubs, gallops RESPIRATORY:  Clear to auscultation without rales, wheezing or rhonchi  ABDOMEN: Soft, non-tender, non-distended MUSCULOSKELETAL:  No edema; No deformity  SKIN: Warm and dry NEUROLOGIC:  Alert and oriented x 3 PSYCHIATRIC:  Normal affect        ASSESSMENT:    1. PAF (paroxysmal atrial fibrillation) (HCC)   2. Persistent atrial fibrillation (HCC)   3. Obstructive sleep apnea (adult) (pediatric)    PLAN:    In order of problems listed above:  #Persistent atrial fibrillation s/p PVI 01/02/2022 Recurrence ~  3 weeks ago. Continue warfarin.  Discussed treatment options for his recurrent AF.   Discussed cardioversion, repeat ablation and antiarrhythmic therapy. He would like to pursue cardioversion. I discussed the procedure in detail including risks and he wishes to proceed. He will need to be therapeutic with his INR 2-3 prior to the procedure.   #Obstructive sleep apnea He is using CPAP.    #Hypertension Controlled.  Continue current regimen  #Diabetes  #Obesity Discussed weight loss and the connection between his weight and likelihood of a having of successful ablation.  Follow-up in 3 months with APP.   Medication Adjustments/Labs and Tests Ordered: Current medicines are reviewed at length with the patient today.  Concerns regarding medicines are outlined above.   Orders Placed This Encounter  Procedures   EKG 12-Lead   No orders of the defined types were placed in this encounter.   I,Mathew Stumpf,acting as a Neurosurgeon for Lanier Prude, MD.,have documented all relevant documentation on the behalf of Lanier Prude, MD,as directed by  Lanier Prude, MD while in the presence of Lanier Prude, MD.  I, Lanier Prude, MD, have reviewed all documentation for this visit. The documentation on 04/08/22 for the exam, diagnosis, procedures, and orders are all accurate and complete.   Signed, Steffanie Dunn, MD, [facility],  [facility] 04/08/2022 8:20 PM    Electrophysiology [facility]

## 2022-04-09 ENCOUNTER — Ambulatory Visit (INDEPENDENT_AMBULATORY_CARE_PROVIDER_SITE_OTHER): Payer: 59

## 2022-04-09 DIAGNOSIS — I480 Paroxysmal atrial fibrillation: Secondary | ICD-10-CM | POA: Diagnosis not present

## 2022-04-09 DIAGNOSIS — Z5181 Encounter for therapeutic drug level monitoring: Secondary | ICD-10-CM | POA: Diagnosis not present

## 2022-04-09 LAB — POCT INR: INR: 3.2 — AB (ref 2.0–3.0)

## 2022-04-09 NOTE — Patient Instructions (Signed)
Description   Spoke to pt's wife, on DPR, and instructed for pt to hold today's dose and then continue taking 1 tablet daily EXCEPT 0.5 tablet on Wednesdays.  Recheck INR in 2 weeks. *Please discuss DOAc option with spouse during next INR check - note on file to discuss after insurance change*  Holding off for now.

## 2022-04-10 ENCOUNTER — Telehealth: Payer: Self-pay | Admitting: Cardiology

## 2022-04-10 NOTE — Telephone Encounter (Signed)
Called and spoke to patient. He has cancelled his DCCV that was scheduled on 12/15. He states that he has to work and cannot get off due to low staffing. He states that he will call back to reschedule for sometime after January. Made him aware that he would need another OV since it would be outside of the 30 day window. Patient verbalized understanding.   I also made patient aware that one of the options that Dr. Lalla Brothers gave for controlling his PAF was antiarrhythmic therapy. Patient declined this as well at this time and stated that he will call back later to set up an appointment to reschedule DCCV.  Instructed patient to let us know if his Sx changed or worsened before then. He verbalized understanding and thanked me for the call.

## 2022-04-10 NOTE — Telephone Encounter (Signed)
Called to say that patient cancel his procedure for 12/15. Please advise

## 2022-04-15 ENCOUNTER — Telehealth: Payer: Self-pay

## 2022-04-15 NOTE — Telephone Encounter (Signed)
Spoke with pt. Pt was notified of echocardiogram results and recommendations. Pt will continue his current medication and follow up as planned.

## 2022-04-23 ENCOUNTER — Ambulatory Visit (INDEPENDENT_AMBULATORY_CARE_PROVIDER_SITE_OTHER): Payer: 59 | Admitting: *Deleted

## 2022-04-23 DIAGNOSIS — I480 Paroxysmal atrial fibrillation: Secondary | ICD-10-CM

## 2022-04-23 LAB — POCT INR: INR: 4.4 — AB (ref 2.0–3.0)

## 2022-04-23 NOTE — Patient Instructions (Signed)
Description   Spoke to pt's wife, on DPR, and instructed for pt to hold today's dose and hold tomorrow's dose then start taking 1 tablet daily EXCEPT 0.5 tablet on Sundays and Wednesdays.  Recheck INR in 1 week (normally 2 weeks). *Please discuss DOAc option with spouse during next INR check - note on file to discuss after insurance change*  Holding off for now.

## 2022-04-25 ENCOUNTER — Ambulatory Visit: Payer: 59 | Admitting: Family Medicine

## 2022-04-30 ENCOUNTER — Ambulatory Visit (INDEPENDENT_AMBULATORY_CARE_PROVIDER_SITE_OTHER): Payer: 59 | Admitting: Family Medicine

## 2022-04-30 ENCOUNTER — Encounter: Payer: Self-pay | Admitting: Family Medicine

## 2022-04-30 VITALS — BP 130/78 | HR 44 | Temp 97.6°F | Resp 18 | Ht 70.0 in | Wt 262.5 lb

## 2022-04-30 DIAGNOSIS — E119 Type 2 diabetes mellitus without complications: Secondary | ICD-10-CM

## 2022-04-30 LAB — MICROALBUMIN / CREATININE URINE RATIO
Creatinine,U: 168.5 mg/dL
Microalb Creat Ratio: 3.8 mg/g (ref 0.0–30.0)
Microalb, Ur: 6.4 mg/dL — ABNORMAL HIGH (ref 0.0–1.9)

## 2022-04-30 LAB — POCT INR: INR: 2.3 (ref 2.0–3.0)

## 2022-04-30 MED ORDER — TRULICITY 0.75 MG/0.5ML ~~LOC~~ SOAJ: 0.7500 mg | SUBCUTANEOUS | 2 refills | Status: DC

## 2022-04-30 NOTE — Patient Instructions (Addendum)
Follow up in 3-4 months to recheck sugar, BP, cholesterol We'll notify you of your lab results and make any changes if needed START the Trulicity injection weekly once it's approved (don't pick it up if it's too expensive) Continue to work on low carb/low sugar diet and regular physical activity- walking is a great start! Call with any questions or concerns Stay Safe!  Stay Healthy! Happy Holidays!!!

## 2022-04-30 NOTE — Assessment & Plan Note (Signed)
Deteriorated. Last A1C was 9.  He states he never started the Trulicity b/c his sugars started to improve.  He is currently on Metformin XR 1000mg  QAM and 500mg  QPM.  He did not understand that Trulicity was not insulin and would help w/ weight loss.  After a longer discussion about the medicine today, he decided he wanted to try it.  Discussed appropriate use, dosing and need for titration, possible side effects.  Check labs.  Will continue to follow closely.

## 2022-04-30 NOTE — Progress Notes (Signed)
   Subjective:    Patient ID: Daniel Grant, male    DOB: 12-18-1965, 56 y.o.   MRN: [id number]  HPI DM- chronic problem.  Currently on Metformin XR 1000mg  QAM and 500mg  QPM.  Never started Trulicity.  On ACE for renal protection but due for microalbumin.  UTD on eye exam and foot exam.  + fatigue but pt is back in Afib.  No CP, SOB, HA's, visual changes, abd pain, N/V.  Denies symptomatic lows.  No numbness/tingling of hands/feet.   Review of Systems For ROS see HPI     Objective:   Physical Exam Vitals reviewed.  Constitutional:      General: He is not in acute distress.    Appearance: Normal appearance. He is well-developed. He is obese. He is not ill-appearing.  HENT:     Head: Normocephalic and atraumatic.  Eyes:     Extraocular Movements: Extraocular movements intact.     Conjunctiva/sclera: Conjunctivae normal.     Pupils: Pupils are equal, round, and reactive to light.  Neck:     Thyroid: No thyromegaly.  Cardiovascular:     Rate and Rhythm: Normal rate. Rhythm irregular.     Pulses: Normal pulses.     Heart sounds: Normal heart sounds. No murmur heard. Pulmonary:     Effort: Pulmonary effort is normal. No respiratory distress.     Breath sounds: Normal breath sounds.  Abdominal:     General: Bowel sounds are normal. There is no distension.     Palpations: Abdomen is soft.  Musculoskeletal:     Cervical back: Normal range of motion and neck supple.     Right lower leg: No edema.     Left lower leg: No edema.  Lymphadenopathy:     Cervical: No cervical adenopathy.  Skin:    General: Skin is warm and dry.  Neurological:     General: No focal deficit present.     Mental Status: He is alert and oriented to person, place, and time.     Cranial Nerves: No cranial nerve deficit.  Psychiatric:        Mood and Affect: Mood normal.        Behavior: Behavior normal.           Assessment & Plan:

## 2022-05-01 ENCOUNTER — Telehealth: Payer: Self-pay

## 2022-05-01 ENCOUNTER — Ambulatory Visit (INDEPENDENT_AMBULATORY_CARE_PROVIDER_SITE_OTHER): Payer: 59

## 2022-05-01 DIAGNOSIS — Z5181 Encounter for therapeutic drug level monitoring: Secondary | ICD-10-CM

## 2022-05-01 DIAGNOSIS — I480 Paroxysmal atrial fibrillation: Secondary | ICD-10-CM | POA: Diagnosis not present

## 2022-05-01 LAB — BASIC METABOLIC PANEL
BUN: 24 mg/dL — ABNORMAL HIGH (ref 6–23)
CO2: 25 mEq/L (ref 19–32)
Calcium: 9.2 mg/dL (ref 8.4–10.5)
Chloride: 105 mEq/L (ref 96–112)
Creatinine, Ser: 1.07 mg/dL (ref 0.40–1.50)
GFR: 76.39 mL/min (ref >60.00)
Glucose, Bld: 144 mg/dL — ABNORMAL HIGH (ref 70–99)
Potassium: 4.4 mEq/L (ref 3.5–5.1)
Sodium: 141 mEq/L (ref 135–145)

## 2022-05-01 LAB — HEMOGLOBIN A1C: Hgb A1c MFr Bld: 8.1 % — ABNORMAL HIGH (ref 4.6–6.5)

## 2022-05-01 NOTE — Telephone Encounter (Signed)
Pt seen results Via my chart  

## 2022-05-01 NOTE — Telephone Encounter (Signed)
-----   Message from Sheliah Hatch, MD sent at 05/01/2022  4:26 PM EST ----- A1C is better!  Still not at goal but I think we will definitely get there with the Trulicity.  Continue to work on low carb diet and regular exercise.  Remainder of labs are stable and look good!

## 2022-05-01 NOTE — Patient Instructions (Signed)
Description   Spoke to pt's wife, on DPR, and instructed for pt to continue taking 1 tablet daily EXCEPT 0.5 tablet on Sundays and Wednesdays.  Recheck INR in 2 weeks.  *Please discuss DOAc option with spouse during next INR check - note on file to discuss after insurance change*  Holding off for now.      

## 2022-05-07 ENCOUNTER — Other Ambulatory Visit (HOSPITAL_COMMUNITY): Payer: Self-pay

## 2022-05-09 ENCOUNTER — Ambulatory Visit (HOSPITAL_COMMUNITY): Admit: 2022-05-09 | Payer: 59 | Admitting: Cardiology

## 2022-05-09 ENCOUNTER — Encounter (HOSPITAL_COMMUNITY): Payer: Self-pay

## 2022-05-09 DIAGNOSIS — Z01818 Encounter for other preprocedural examination: Secondary | ICD-10-CM

## 2022-05-09 DIAGNOSIS — I480 Paroxysmal atrial fibrillation: Secondary | ICD-10-CM

## 2022-05-09 SURGERY — CARDIOVERSION
Anesthesia: General

## 2022-05-12 ENCOUNTER — Other Ambulatory Visit: Payer: Self-pay | Admitting: Cardiology

## 2022-05-14 ENCOUNTER — Ambulatory Visit (INDEPENDENT_AMBULATORY_CARE_PROVIDER_SITE_OTHER): Payer: 59

## 2022-05-14 DIAGNOSIS — Z5181 Encounter for therapeutic drug level monitoring: Secondary | ICD-10-CM

## 2022-05-14 DIAGNOSIS — I480 Paroxysmal atrial fibrillation: Secondary | ICD-10-CM | POA: Diagnosis not present

## 2022-05-14 LAB — POCT INR: INR: 2.9 (ref 2.0–3.0)

## 2022-05-14 NOTE — Patient Instructions (Signed)
Description   Spoke to pt's wife, on DPR, and instructed for pt to continue taking 1 tablet daily EXCEPT 0.5 tablet on Sundays and Wednesdays.  Recheck INR in 2 weeks.  *Please discuss DOAc option with spouse during next INR check - note on file to discuss after insurance change*  Holding off for now.

## 2022-05-18 ENCOUNTER — Other Ambulatory Visit: Payer: Self-pay | Admitting: Family Medicine

## 2022-05-18 ENCOUNTER — Other Ambulatory Visit: Payer: Self-pay | Admitting: Cardiology

## 2022-05-18 DIAGNOSIS — I480 Paroxysmal atrial fibrillation: Secondary | ICD-10-CM

## 2022-05-18 DIAGNOSIS — N522 Drug-induced erectile dysfunction: Secondary | ICD-10-CM

## 2022-05-28 ENCOUNTER — Ambulatory Visit (INDEPENDENT_AMBULATORY_CARE_PROVIDER_SITE_OTHER): Payer: 59 | Admitting: *Deleted

## 2022-05-28 DIAGNOSIS — Z5181 Encounter for therapeutic drug level monitoring: Secondary | ICD-10-CM | POA: Diagnosis not present

## 2022-05-28 DIAGNOSIS — I480 Paroxysmal atrial fibrillation: Secondary | ICD-10-CM

## 2022-05-28 LAB — POCT INR: INR: 3 (ref 2.0–3.0)

## 2022-05-28 NOTE — Patient Instructions (Signed)
Description   Call wife phone at [phone]. Spoke to pt's wife, on [place], and instructed for pt to take an extra 1/2 tablet today then continue taking 1 tablet daily EXCEPT 0.5 tablet on Sundays and Wednesdays.  Recheck INR in 2 weeks.  *Please discuss DOAC option with spouse during next INR check - note on file to discuss after insurance change*  Holding off for now.

## 2022-06-11 ENCOUNTER — Ambulatory Visit (INDEPENDENT_AMBULATORY_CARE_PROVIDER_SITE_OTHER): Payer: 59

## 2022-06-11 DIAGNOSIS — I480 Paroxysmal atrial fibrillation: Secondary | ICD-10-CM

## 2022-06-11 DIAGNOSIS — Z5181 Encounter for therapeutic drug level monitoring: Secondary | ICD-10-CM | POA: Diagnosis not present

## 2022-06-11 LAB — POCT INR: INR: 4.3 — AB (ref 2.0–3.0)

## 2022-06-11 NOTE — Patient Instructions (Signed)
Description   Call wife phone at [phone]. Spoke to pt's wife, on [place], and instructed for pt to HOLD today's dose and eat greens and then continue taking 1 tablet daily EXCEPT 0.5 tablet on Sundays and Wednesdays. Recheck INR in 2 weeks.  *Please discuss DOAC option with spouse during next INR check - note on file to discuss after insurance change*  Holding off for now.

## 2022-06-23 NOTE — Progress Notes (Signed)
HPI: FU atrial fibrillation. Cardiac catheterization in January of 2001 showed normal LV function and normal coronary arteries. Nuclear study in June of 2011 showed soft tissue attenuation and no ischemia. Patient underwent atrial fibrillation ablation August 2023.  TEE prior to ablation showed ejection fraction 40 to 45%, mild RV dysfunction, moderate left atrial enlargement, mild to moderate right atrial enlargement, mild mitral regurgitation.  Patient separately seen in the emergency room August 28 with recurrent atrial fibrillation.  Patient's INR noted to be subtherapeutic at 1.6.  Last echocardiogram November 2023 showed normal LV function, mild left ventricular hypertrophy, moderate left atrial enlargement.  Cardioversion was planned but patient canceled.  Since last seen, patient notes some dyspnea on exertion but no orthopnea, PND, pedal edema, chest pain, palpitations or syncope.  Current Outpatient Medications  Medication Sig Dispense Refill   benazepril (LOTENSIN) 20 MG tablet TAKE 1 TABLET BY MOUTH DAILY 90 tablet 3   cetirizine (ZYRTEC) 10 MG tablet Take 10 mg by mouth daily.     diltiazem (CARDIZEM CD) 180 MG 24 hr capsule TAKE 1 CAPSULE BY MOUTH  DAILY 90 capsule 3   Dulaglutide (TRULICITY) A999333 0000000 SOPN Inject 0.75 mg into the skin once a week. 2 mL 2   fluticasone (FLONASE) 50 MCG/ACT nasal spray Place 2 sprays into both nostrils daily. 16 g 5   hydrochlorothiazide (HYDRODIURIL) 12.5 MG tablet TAKE 1 TABLET BY MOUTH  DAILY 90 tablet 3   metFORMIN (GLUCOPHAGE-XR) 500 MG 24 hr tablet TAKE 2 TABLETS BY MOUTH  BEFORE BREAKFAST AND 1  TABLET BY MOUTH WITH SUPPER 270 tablet 3   metoprolol succinate (TOPROL-XL) 100 MG 24 hr tablet TAKE 1 TABLET BY MOUTH  DAILY WITH OR IMMEDIATELY  FOLLOWING A MEAL 90 tablet 3   Multiple Vitamin (MULTIVITAMIN) tablet Take 1 tablet by mouth daily.     Omega-3 Fatty Acids (FISH OIL) 1200 MG CAPS Take 2 capsules (2,400 mg total) by mouth 2 (two)  times daily.     pravastatin (PRAVACHOL) 40 MG tablet TAKE 1 TABLET BY MOUTH DAILY 90 tablet 3   sildenafil (VIAGRA) 100 MG tablet TAKE 1 TABLET BY MOUTH ONCE DAILY AS NEEDED FOR ERECTILE DYSFUNCTION 10 tablet 0   warfarin (COUMADIN) 10 MG tablet TAKE 1/2 TO 1 TABLET BY MOUTH  DAILY AS DIRECTED BY COUMADIN  CLINIC 90 tablet 3   No current facility-administered medications for this visit.     Past Medical History:  Diagnosis Date   DM II (diabetes mellitus, type II), controlled ([place])    Erectile dysfunction    Hyperlipidemia    Hypertension    Obesity    OSA (obstructive sleep apnea)    Paroxysmal atrial fibrillation ([place])     Past Surgical History:  Procedure Laterality Date   ATRIAL FIBRILLATION ABLATION N/A 01/02/2022   Procedure: ATRIAL FIBRILLATION ABLATION;  Surgeon: Vickie Epley, MD;  Location: [facility];  Service: Cardiovascular;  Laterality: N/A;   CARDIAC CATHETERIZATION     CARDIOVERSION N/A 09/10/2020   Procedure: CARDIOVERSION;  Surgeon: Lelon Perla, MD;  Location[facility] ENDOSCOPY;  Service: Cardiovascular;  Laterality: N/A;   FINGER SURGERY     left 1st finger   TEE WITHOUT CARDIOVERSION  01/02/2022   Procedure: TRANSESOPHAGEAL ECHOCARDIOGRAM (TEE);  Surgeon: Vickie Epley, MD;  Location: [facility];  Service: Cardiovascular;;    Social History   Socioeconomic History   Marital status: Married    Spouse name: Not on  file   Number of children: Not on file   Years of education: Not on file   Highest education level: Not on file  Occupational History   Not on file  Tobacco Use   Smoking status: Former    Types: Cigarettes    Quit date: 05/26/1994    Years since quitting: 28.1   Smokeless tobacco: Never   Tobacco comments:    Former smoker  quit in 1995 01/30/22  Vaping Use   Vaping Use: Never used  Substance and Sexual Activity   Alcohol use: Not Currently    Alcohol/week: 5.0 standard drinks of alcohol    Types: 5 Cans of  beer per week   Drug use: No   Sexual activity: Yes  Other Topics Concern   Not on file  Social History Narrative   Not on file   Social Determinants of Health   Financial Resource Strain: Not on file  Food Insecurity: Not on file  Transportation Needs: Not on file  Physical Activity: Not on file  Stress: Not on file  Social Connections: Not on file  Intimate Partner Violence: Not on file    Family History  Problem Relation Age of Onset   Heart attack Paternal Uncle    Heart attack Maternal Grandmother    Heart disease Mother    Hypertension Mother    Diabetes Mother    Alcohol abuse Father    Cancer Neg Hx    Early death Neg Hx    Hyperlipidemia Neg Hx    Kidney disease Neg Hx    Stroke Neg Hx     ROS: no fevers or chills, productive cough, hemoptysis, dysphasia, odynophagia, melena, hematochezia, dysuria, hematuria, rash, seizure activity, orthopnea, PND, pedal edema, claudication. Remaining systems are negative.  Physical Exam: Well-developed well-nourished in no acute distress.  Skin is warm and dry.  HEENT is normal.  Neck is supple.  Chest is clear to auscultation with normal expansion.  Cardiovascular exam is irregular and tachycardic Abdominal exam nontender or distended. No masses palpated. Extremities show no edema. neuro grossly intact  ECG-atrial fibrillation with rapid ventricular response and nonspecific ST changes.  Personally reviewed  A/P  1 paroxysmal atrial fibrillation-patient is status post ablation but had recurrence of atrial fibrillation.  Cardioversion had been planned but he canceled.  He is now willing to proceed.  His heart rate is 159 today and he does note increased dyspnea on exertion.  His INR has been therapeutic.  We have arranged cardioversion for today to reestablish sinus rhythm.  If atrial fibrillation recurs he will likely need Tikosyn.  Continue Cardizem and Toprol.  Continue Coumadin.  We will arrange follow-up with atrial  fibrillation clinic on Monday.  2 hypertension-blood pressure controlled.  Continue present medications.  3 history of LV dysfunction-echocardiogram has normalized on most recent echo.  I am concerned with his elevated heart rate that he may have developed worsening LV function secondary to tachycardia.  Will plan to repeat his echocardiogram in 3 months.  4 hyperlipidemia-continue statin.  5 obstructive sleep apnea-continue CPAP.  Kirk Ruths, MD

## 2022-06-23 NOTE — H&P (View-Only) (Signed)
HPI: FU atrial fibrillation. Cardiac catheterization in January of 2001 showed normal LV function and normal coronary arteries. Nuclear study in June of 2011 showed soft tissue attenuation and no ischemia. Patient underwent atrial fibrillation ablation August 2023.  TEE prior to ablation showed ejection fraction 40 to 45%, mild RV dysfunction, moderate left atrial enlargement, mild to moderate right atrial enlargement, mild mitral regurgitation.  Patient separately seen in the emergency room August 28 with recurrent atrial fibrillation.  Patient's INR noted to be subtherapeutic at 1.6.  Last echocardiogram November 2023 showed normal LV function, mild left ventricular hypertrophy, moderate left atrial enlargement.  Cardioversion was planned but patient canceled.  Since last seen, patient notes some dyspnea on exertion but no orthopnea, PND, pedal edema, chest pain, palpitations or syncope.  Current Outpatient Medications  Medication Sig Dispense Refill   benazepril (LOTENSIN) 20 MG tablet TAKE 1 TABLET BY MOUTH DAILY 90 tablet 3   cetirizine (ZYRTEC) 10 MG tablet Take 10 mg by mouth daily.     diltiazem (CARDIZEM CD) 180 MG 24 hr capsule TAKE 1 CAPSULE BY MOUTH  DAILY 90 capsule 3   Dulaglutide (TRULICITY) A999333 0000000 SOPN Inject 0.75 mg into the skin once a week. 2 mL 2   fluticasone (FLONASE) 50 MCG/ACT nasal spray Place 2 sprays into both nostrils daily. 16 g 5   hydrochlorothiazide (HYDRODIURIL) 12.5 MG tablet TAKE 1 TABLET BY MOUTH  DAILY 90 tablet 3   metFORMIN (GLUCOPHAGE-XR) 500 MG 24 hr tablet TAKE 2 TABLETS BY MOUTH  BEFORE BREAKFAST AND 1  TABLET BY MOUTH WITH SUPPER 270 tablet 3   metoprolol succinate (TOPROL-XL) 100 MG 24 hr tablet TAKE 1 TABLET BY MOUTH  DAILY WITH OR IMMEDIATELY  FOLLOWING A MEAL 90 tablet 3   Multiple Vitamin (MULTIVITAMIN) tablet Take 1 tablet by mouth daily.     Omega-3 Fatty Acids (FISH OIL) 1200 MG CAPS Take 2 capsules (2,400 mg total) by mouth 2 (two)  times daily.     pravastatin (PRAVACHOL) 40 MG tablet TAKE 1 TABLET BY MOUTH DAILY 90 tablet 3   sildenafil (VIAGRA) 100 MG tablet TAKE 1 TABLET BY MOUTH ONCE DAILY AS NEEDED FOR ERECTILE DYSFUNCTION 10 tablet 0   warfarin (COUMADIN) 10 MG tablet TAKE 1/2 TO 1 TABLET BY MOUTH  DAILY AS DIRECTED BY COUMADIN  CLINIC 90 tablet 3   No current facility-administered medications for this visit.     Past Medical History:  Diagnosis Date   DM II (diabetes mellitus, type II), controlled ([place])    Erectile dysfunction    Hyperlipidemia    Hypertension    Obesity    OSA (obstructive sleep apnea)    Paroxysmal atrial fibrillation ([place])     Past Surgical History:  Procedure Laterality Date   ATRIAL FIBRILLATION ABLATION N/A 01/02/2022   Procedure: ATRIAL FIBRILLATION ABLATION;  Surgeon: Vickie Epley, MD;  Location: [facility];  Service: Cardiovascular;  Laterality: N/A;   CARDIAC CATHETERIZATION     CARDIOVERSION N/A 09/10/2020   Procedure: CARDIOVERSION;  Surgeon: Lelon Perla, MD;  Location: [facility] ENDOSCOPY;  Service: Cardiovascular;  Laterality: N/A;   FINGER SURGERY     left 1st finger   TEE WITHOUT CARDIOVERSION  01/02/2022   Procedure: TRANSESOPHAGEAL ECHOCARDIOGRAM (TEE);  Surgeon: Vickie Epley, MD;  Location: [facility];  Service: Cardiovascular;;    Social History   Socioeconomic History   Marital status: Married    Spouse name: Not on  file   Number of children: Not on file   Years of education: Not on file   Highest education level: Not on file  Occupational History   Not on file  Tobacco Use   Smoking status: Former    Types: Cigarettes    Quit date: 05/26/1994    Years since quitting: 28.1   Smokeless tobacco: Never   Tobacco comments:    Former smoker  quit in 1995 01/30/22  Vaping Use   Vaping Use: Never used  Substance and Sexual Activity   Alcohol use: Not Currently    Alcohol/week: 5.0 standard drinks of alcohol    Types: 5 Cans of  beer per week   Drug use: No   Sexual activity: Yes  Other Topics Concern   Not on file  Social History Narrative   Not on file   Social Determinants of Health   Financial Resource Strain: Not on file  Food Insecurity: Not on file  Transportation Needs: Not on file  Physical Activity: Not on file  Stress: Not on file  Social Connections: Not on file  Intimate Partner Violence: Not on file    Family History  Problem Relation Age of Onset   Heart attack Paternal Uncle    Heart attack Maternal Grandmother    Heart disease Mother    Hypertension Mother    Diabetes Mother    Alcohol abuse Father    Cancer Neg Hx    Early death Neg Hx    Hyperlipidemia Neg Hx    Kidney disease Neg Hx    Stroke Neg Hx     ROS: no fevers or chills, productive cough, hemoptysis, dysphasia, odynophagia, melena, hematochezia, dysuria, hematuria, rash, seizure activity, orthopnea, PND, pedal edema, claudication. Remaining systems are negative.  Physical Exam: Well-developed well-nourished in no acute distress.  Skin is warm and dry.  HEENT is normal.  Neck is supple.  Chest is clear to auscultation with normal expansion.  Cardiovascular exam is irregular and tachycardic Abdominal exam nontender or distended. No masses palpated. Extremities show no edema. neuro grossly intact  ECG-atrial fibrillation with rapid ventricular response and nonspecific ST changes.  Personally reviewed  A/P  1 paroxysmal atrial fibrillation-patient is status post ablation but had recurrence of atrial fibrillation.  Cardioversion had been planned but he canceled.  He is now willing to proceed.  His heart rate is 159 today and he does note increased dyspnea on exertion.  His INR has been therapeutic.  We have arranged cardioversion for today to reestablish sinus rhythm.  If atrial fibrillation recurs he will likely need Tikosyn.  Continue Cardizem and Toprol.  Continue Coumadin.  We will arrange follow-up with atrial  fibrillation clinic on Monday.  2 hypertension-blood pressure controlled.  Continue present medications.  3 history of LV dysfunction-echocardiogram has normalized on most recent echo.  I am concerned with his elevated heart rate that he may have developed worsening LV function secondary to tachycardia.  Will plan to repeat his echocardiogram in 3 months.  4 hyperlipidemia-continue statin.  5 obstructive sleep apnea-continue CPAP.  Kirk Ruths, MD

## 2022-06-25 ENCOUNTER — Ambulatory Visit (INDEPENDENT_AMBULATORY_CARE_PROVIDER_SITE_OTHER): Payer: 59 | Admitting: *Deleted

## 2022-06-25 DIAGNOSIS — Z5181 Encounter for therapeutic drug level monitoring: Secondary | ICD-10-CM | POA: Diagnosis not present

## 2022-06-25 DIAGNOSIS — I480 Paroxysmal atrial fibrillation: Secondary | ICD-10-CM | POA: Diagnosis not present

## 2022-06-25 LAB — POCT INR: INR: 3.6 — AB (ref 2.0–3.0)

## 2022-06-25 NOTE — Patient Instructions (Addendum)
Description   Call wife phone at [phone]. Spoke to pt's wife, on [place], and instructed for pt to HOLD today's dose then continue taking 1 tablet daily EXCEPT 0.5 tablet on Sundays, Wednesdays, and Fridays. Recheck INR in 2 weeks.  *Please discuss DOAC option with spouse during next INR check - note on file to discuss after insurance change*  Holding off for now.

## 2022-07-03 ENCOUNTER — Encounter (HOSPITAL_COMMUNITY): Payer: Self-pay | Admitting: *Deleted

## 2022-07-04 ENCOUNTER — Encounter (HOSPITAL_COMMUNITY): Payer: Self-pay | Admitting: Cardiology

## 2022-07-04 ENCOUNTER — Ambulatory Visit (HOSPITAL_BASED_OUTPATIENT_CLINIC_OR_DEPARTMENT_OTHER): Payer: 59 | Admitting: Anesthesiology

## 2022-07-04 ENCOUNTER — Encounter (HOSPITAL_COMMUNITY)
Admission: RE | Disposition: A | Discharge status: Home / Self Care | Payer: Self-pay | Source: Ambulatory Visit | Attending: Cardiology

## 2022-07-04 ENCOUNTER — Ambulatory Visit (HOSPITAL_COMMUNITY)
Admission: RE | Admit: 2022-07-04 | Discharge: 2022-07-04 | Disposition: A | Discharge status: Home / Self Care | Payer: 59 | Source: Ambulatory Visit | Attending: Cardiology | Admitting: Cardiology

## 2022-07-04 ENCOUNTER — Encounter: Payer: Self-pay | Admitting: Cardiology

## 2022-07-04 ENCOUNTER — Ambulatory Visit: Payer: 59 | Attending: Cardiology | Admitting: Cardiology

## 2022-07-04 ENCOUNTER — Other Ambulatory Visit: Payer: Self-pay

## 2022-07-04 ENCOUNTER — Encounter: Payer: Self-pay | Admitting: *Deleted

## 2022-07-04 ENCOUNTER — Ambulatory Visit (HOSPITAL_COMMUNITY): Payer: 59 | Admitting: Anesthesiology

## 2022-07-04 ENCOUNTER — Other Ambulatory Visit: Payer: Self-pay | Admitting: *Deleted

## 2022-07-04 VITALS — BP 110/72 | HR 159 | Ht 70.0 in | Wt 260.0 lb

## 2022-07-04 DIAGNOSIS — E785 Hyperlipidemia, unspecified: Secondary | ICD-10-CM | POA: Insufficient documentation

## 2022-07-04 DIAGNOSIS — E119 Type 2 diabetes mellitus without complications: Secondary | ICD-10-CM

## 2022-07-04 DIAGNOSIS — I10 Essential (primary) hypertension: Secondary | ICD-10-CM | POA: Diagnosis not present

## 2022-07-04 DIAGNOSIS — Z7984 Long term (current) use of oral hypoglycemic drugs: Secondary | ICD-10-CM

## 2022-07-04 DIAGNOSIS — I4891 Unspecified atrial fibrillation: Secondary | ICD-10-CM | POA: Diagnosis not present

## 2022-07-04 DIAGNOSIS — G4733 Obstructive sleep apnea (adult) (pediatric): Secondary | ICD-10-CM | POA: Diagnosis not present

## 2022-07-04 DIAGNOSIS — I4819 Other persistent atrial fibrillation: Secondary | ICD-10-CM | POA: Diagnosis not present

## 2022-07-04 DIAGNOSIS — E782 Mixed hyperlipidemia: Secondary | ICD-10-CM

## 2022-07-04 DIAGNOSIS — Z7901 Long term (current) use of anticoagulants: Secondary | ICD-10-CM | POA: Diagnosis not present

## 2022-07-04 DIAGNOSIS — Z87891 Personal history of nicotine dependence: Secondary | ICD-10-CM | POA: Diagnosis not present

## 2022-07-04 DIAGNOSIS — I480 Paroxysmal atrial fibrillation: Secondary | ICD-10-CM | POA: Diagnosis not present

## 2022-07-04 DIAGNOSIS — Z09 Encounter for follow-up examination after completed treatment for conditions other than malignant neoplasm: Secondary | ICD-10-CM | POA: Diagnosis not present

## 2022-07-04 DIAGNOSIS — Z9889 Other specified postprocedural states: Secondary | ICD-10-CM | POA: Diagnosis not present

## 2022-07-04 DIAGNOSIS — Z79899 Other long term (current) drug therapy: Secondary | ICD-10-CM | POA: Diagnosis not present

## 2022-07-04 HISTORY — PX: CARDIOVERSION: SHX1299

## 2022-07-04 LAB — PROTIME-INR
INR: 2 — ABNORMAL HIGH (ref 0.8–1.2)
Prothrombin Time: 22.4 seconds — ABNORMAL HIGH (ref 11.4–15.2)

## 2022-07-04 LAB — POCT I-STAT, CHEM 8
BUN: 27 mg/dL — ABNORMAL HIGH (ref 6–20)
Calcium, Ion: 1.24 mmol/L (ref 1.15–1.40)
Chloride: 104 mmol/L (ref 98–111)
Creatinine, Ser: 0.9 mg/dL (ref 0.61–1.24)
Glucose, Bld: 112 mg/dL — ABNORMAL HIGH (ref 70–99)
HCT: 47 % (ref 39.0–52.0)
Hemoglobin: 16 g/dL (ref 13.0–17.0)
Potassium: 4.2 mmol/L (ref 3.5–5.1)
Sodium: 141 mmol/L (ref 135–145)
TCO2: 26 mmol/L (ref 22–32)

## 2022-07-04 SURGERY — CARDIOVERSION
Anesthesia: General

## 2022-07-04 MED ORDER — SODIUM CHLORIDE 0.9 % IV SOLN: INTRAVENOUS | Status: DC

## 2022-07-04 MED ORDER — LIDOCAINE 2% (20 MG/ML) 5 ML SYRINGE
INTRAMUSCULAR | Status: DC | PRN
  Administered 2022-07-04: 100 mg via INTRAVENOUS

## 2022-07-04 MED ORDER — PROPOFOL 10 MG/ML IV BOLUS
INTRAVENOUS | Status: DC | PRN
  Administered 2022-07-04: 100 mg via INTRAVENOUS

## 2022-07-04 NOTE — CV Procedure (Signed)
Procedure:   DCCV  Indication:  Symptomatic atrial fibrillation  Procedure Note:  The patient signed informed consent.  They have had had therapeutic anticoagulation with coumadin greater than 3 weeks.  Anesthesia was administered by Dr. Sabra Heck.  Patient received 100 mg IV lidocaine and 100 mg IV propofol.Adequate airway was maintained throughout and vital followed per protocol.  They were cardioverted x 1 with 150J of biphasic synchronized energy, with brief sinus rhythm them return to afib RVR. They received another shock of 200J x1 with restoration and maintenance of sinus rhythm.  There were no apparent complications.  The patient had normal neuro status and respiratory status post procedure with vitals stable as recorded elsewhere.    Follow up:  They will continue on current medical therapy and follow up with cardiology as scheduled.  Buford Dresser, MD PhD 07/04/2022 12:14 PM

## 2022-07-04 NOTE — Anesthesia Postprocedure Evaluation (Signed)
Anesthesia Post Note  Patient: Daniel Grant  Procedure(s) Performed: CARDIOVERSION     Patient location during evaluation: PACU Anesthesia Type: General Level of consciousness: awake and alert Pain management: pain level controlled Vital Signs Assessment: post-procedure vital signs reviewed and stable Respiratory status: spontaneous breathing, nonlabored ventilation and respiratory function stable Cardiovascular status: blood pressure returned to baseline and stable Postop Assessment: no apparent nausea or vomiting Anesthetic complications: no   No notable events documented.  Last Vitals:  Vitals:   07/04/22 1220 07/04/22 1230  BP: (!) 132/98 (!) 123/98  Pulse: 81 79  Resp: 18 16  Temp:    SpO2: 96% 96%    Last Pain:  Vitals:   07/04/22 1230  TempSrc:   PainSc: 0-No pain                 Lynda Rainwater

## 2022-07-04 NOTE — Patient Instructions (Signed)
   You are scheduled for a Cardioversion on Friday, February 9 with .  Please arrive at the [facility] (Main Entrance A) at [facility]: [address] [place], [place] [address] at 10:30 AM.   DIET:  Nothing to eat or drink after midnight except a sip of water with medications (see medication instructions below)  MEDICATION INSTRUCTIONS:     LABS: NOW  FYI:  For your safety, and to allow [place] to monitor your vital signs accurately during the surgery/procedure we request: If you have artificial nails, gel coating, SNS etc, please have those removed prior to your surgery/procedure. Not having the nail coverings /polish removed may result in cancellation or delay of your surgery/procedure.  You must have a responsible person to drive you home and stay in the waiting area during your procedure. Failure to do so could result in cancellation.  Bring your insurance cards.  *Special Note: Every effort is made to have your procedure done on time. Occasionally there are emergencies that occur at the hospital that may cause delays. Please be patient if a delay does occur.

## 2022-07-04 NOTE — Interval H&P Note (Signed)
History and Physical Interval Note:  07/04/2022 11:15 AM  Daniel Grant  has presented today for surgery, with the diagnosis of afib.  The various methods of treatment have been discussed with the patient and family. After consideration of risks, benefits and other options for treatment, the patient has consented to  Procedure(s): CARDIOVERSION (N/A) as a surgical intervention.  The patient's history has been reviewed, patient examined, no change in status, stable for surgery.  I have reviewed the patient's chart and labs.  Questions were answered to the patient's satisfaction.     Myelle Poteat Harrell Gave

## 2022-07-04 NOTE — Anesthesia Procedure Notes (Signed)
Procedure Name: General with mask airway Date/Time: 07/04/2022 12:10 PM  Performed by: Amadeo Garnet, CRNAPre-anesthesia Checklist: Patient identified, Suction available, Patient being monitored and Emergency Drugs available Patient Re-evaluated:Patient Re-evaluated prior to induction Oxygen Delivery Method: Ambu bag Preoxygenation: Pre-oxygenation with 100% oxygen Induction Type: IV induction Placement Confirmation: positive ETCO2 Dental Injury: Teeth and Oropharynx as per pre-operative assessment

## 2022-07-04 NOTE — Discharge Instructions (Signed)

## 2022-07-04 NOTE — Transfer of Care (Signed)
Immediate Anesthesia Transfer of Care Note  Patient: Daniel Grant  Procedure(s) Performed: CARDIOVERSION  Patient Location: Endoscopy Unit  Anesthesia Type:General  Level of Consciousness: drowsy and patient cooperative  Airway & Oxygen Therapy: Patient Spontanous Breathing and Patient connected to nasal cannula oxygen  Post-op Assessment: Report given to RN, Post -op Vital signs reviewed and stable, and Patient moving all extremities  Post vital signs: Reviewed and stable  Last Vitals:  Vitals Value Taken Time  BP    Temp    Pulse 80 07/04/22 1213  Resp 16 07/04/22 1213  SpO2 98 % 07/04/22 1213  Vitals shown include unvalidated device data.  Last Pain:  Vitals:   07/04/22 1112  TempSrc: Temporal  PainSc: 0-No pain         Complications: No notable events documented.

## 2022-07-04 NOTE — Anesthesia Preprocedure Evaluation (Signed)
Anesthesia Evaluation  Patient identified by MRN, date of birth, ID band Patient awake    Reviewed: Allergy & Precautions, NPO status , Patient's Chart, lab work & pertinent test results  Airway Mallampati: III  TM Distance: >3 FB Neck ROM: Full    Dental  (+) Dental Advisory Given   Pulmonary sleep apnea , former smoker   breath sounds clear to auscultation       Cardiovascular hypertension, Pt. on medications and Pt. on home beta blockers + dysrhythmias Atrial Fibrillation  Rhythm:Regular Rate:Normal     Neuro/Psych negative neurological ROS     GI/Hepatic negative GI ROS, Neg liver ROS,,,  Endo/Other  diabetes, Type 2, Oral Hypoglycemic Agents    Renal/GU negative Renal ROS     Musculoskeletal   Abdominal  (+) + obese  Peds  Hematology  (+) Blood dyscrasia (On coumadin)   Anesthesia Other Findings   Reproductive/Obstetrics                             Lab Results  Component Value Date   WBC 12.8 (H) 02/03/2022   HGB 16.0 07/04/2022   HCT 47.0 07/04/2022   MCV 84 02/03/2022   PLT 286 02/03/2022   Lab Results  Component Value Date   CREATININE 0.90 07/04/2022   BUN 27 (H) 07/04/2022   NA 141 07/04/2022   K 4.2 07/04/2022   CL 104 07/04/2022   CO2 25 04/30/2022    Anesthesia Physical Anesthesia Plan  ASA: 3 and emergent  Anesthesia Plan: General   Post-op Pain Management: Minimal or no pain anticipated   Induction: Intravenous  PONV Risk Score and Plan: 2 and Ondansetron, Treatment may vary due to age or medical condition and Midazolam  Airway Management Planned: Natural Airway  Additional Equipment:   Intra-op Plan:   Post-operative Plan:   Informed Consent: I have reviewed the patients History and Physical, chart, labs and discussed the procedure including the risks, benefits and alternatives for the proposed anesthesia with the patient or authorized  representative who has indicated his/her understanding and acceptance.     Dental advisory given  Plan Discussed with: CRNA  Anesthesia Plan Comments:         Anesthesia Quick Evaluation

## 2022-07-07 ENCOUNTER — Ambulatory Visit (HOSPITAL_COMMUNITY)
Admission: RE | Admit: 2022-07-07 | Discharge: 2022-07-07 | Disposition: A | Discharge status: Home / Self Care | Payer: 59 | Source: Ambulatory Visit | Attending: Physician Assistant | Admitting: Physician Assistant

## 2022-07-07 ENCOUNTER — Telehealth: Payer: Self-pay | Admitting: Pharmacist

## 2022-07-07 ENCOUNTER — Encounter (HOSPITAL_COMMUNITY): Payer: Self-pay | Admitting: Cardiology

## 2022-07-07 VITALS — BP 148/94 | HR 112 | Ht 70.0 in | Wt 261.4 lb

## 2022-07-07 DIAGNOSIS — I4819 Other persistent atrial fibrillation: Secondary | ICD-10-CM | POA: Diagnosis present

## 2022-07-07 DIAGNOSIS — G4733 Obstructive sleep apnea (adult) (pediatric): Secondary | ICD-10-CM | POA: Insufficient documentation

## 2022-07-07 DIAGNOSIS — E785 Hyperlipidemia, unspecified: Secondary | ICD-10-CM | POA: Insufficient documentation

## 2022-07-07 DIAGNOSIS — I10 Essential (primary) hypertension: Secondary | ICD-10-CM | POA: Insufficient documentation

## 2022-07-07 DIAGNOSIS — D6869 Other thrombophilia: Secondary | ICD-10-CM | POA: Insufficient documentation

## 2022-07-07 DIAGNOSIS — Z6837 Body mass index (BMI) 37.0-37.9, adult: Secondary | ICD-10-CM | POA: Diagnosis not present

## 2022-07-07 DIAGNOSIS — E119 Type 2 diabetes mellitus without complications: Secondary | ICD-10-CM | POA: Insufficient documentation

## 2022-07-07 DIAGNOSIS — Z7901 Long term (current) use of anticoagulants: Secondary | ICD-10-CM | POA: Diagnosis not present

## 2022-07-07 DIAGNOSIS — Z006 Encounter for examination for normal comparison and control in clinical research program: Secondary | ICD-10-CM

## 2022-07-07 DIAGNOSIS — I480 Paroxysmal atrial fibrillation: Secondary | ICD-10-CM

## 2022-07-07 DIAGNOSIS — E669 Obesity, unspecified: Secondary | ICD-10-CM | POA: Insufficient documentation

## 2022-07-07 LAB — MAGNESIUM: Magnesium: 1.9 mg/dL (ref 1.7–2.4)

## 2022-07-07 MED ORDER — DILTIAZEM HCL ER COATED BEADS 240 MG PO CP24: 240.0000 mg | ORAL_CAPSULE | Freq: Every day | ORAL | 3 refills | Status: DC

## 2022-07-07 NOTE — Research (Signed)
[place] Informed Consent   Subject Name: Daniel Grant  Subject met inclusion and exclusion criteria.  The informed consent form, study requirements and expectations were reviewed with the subject and questions and concerns were addressed prior to the signing of the consent form.  The subject verbalized understanding of the trial requirements.  The subject agreed to participate in the [facility] trial and signed the informed consent on 12/Feb/2024.  The informed consent was obtained prior to performance of any protocol-specific procedures for the subject.  A copy of the signed informed consent was given to the subject and a copy was placed in the subject's medical record.   Tori Milks Gio Janoski

## 2022-07-07 NOTE — Patient Instructions (Signed)
Continue weekly INR checks -- if you should have an INR less than 2.0 please let Marzetta Board know [id number]    Hold Trulicity the week before admission

## 2022-07-07 NOTE — Progress Notes (Signed)
Primary Care Physician: Midge Minium, MD Primary Cardiologist: Dr Stanford Breed Primary Electrophysiologist: Dr Quentin Ore Referring Physician: Dr Charlotte Crumb is a 57 y.o. male with a history of DM, HTN, HLD, OSA, atrial fibrillation who presents for follow up in the [facility]. Patient is on warfarin for a CHADS2VASC score of 3. Patient is s/p afib ablation with Dr Quentin Ore on 01/02/22. TEE at the time of ablation showed decreased systolic function with EF 40-45%.  Patient was seen at the ED with neck pain and found to be back in rapid afib. Neck pain was felt to be trap spasm. He was discharged from the ED in afib. Patient has converted back to SR before his follow up on 01/30/22 but went out of rhythm again and is s/p DCCV 07/04/22. He has a Paramedic which showed he was back out of rhythm within 24 hours.   On follow up today, patient remains in afib with palpitations. He denies any bleeding issues on anticoagulation.   Today, he denies symptoms of chest pain, shortness of breath, orthopnea, PND, lower extremity edema, dizziness, presyncope, syncope, snoring, daytime somnolence, bleeding, or neurologic sequela. The patient is tolerating medications without difficulties and is otherwise without complaint today.    Atrial Fibrillation Risk Factors:  he does have symptoms or diagnosis of sleep apnea. he is compliant with CPAP therapy. he does not have a history of rheumatic fever.   he has a BMI of Body mass index is 37.51 kg/m.Marland Kitchen Filed Weights   07/07/22 1511  Weight: 118.6 kg    Family History  Problem Relation Age of Onset   Heart attack Paternal Uncle    Heart attack Maternal Grandmother    Heart disease Mother    Hypertension Mother    Diabetes Mother    Alcohol abuse Father    Cancer Neg Hx    Early death Neg Hx    Hyperlipidemia Neg Hx    Kidney disease Neg Hx    Stroke Neg Hx      Atrial Fibrillation Management  history:  Previous antiarrhythmic drugs: none Previous cardioversions: 2022, 07/04/22 Previous ablations: 01/02/22 CHADS2VASC score: 3 Anticoagulation history: warfarin    Past Medical History:  Diagnosis Date   DM II (diabetes mellitus, type II), controlled ([place])    Erectile dysfunction    Hyperlipidemia    Hypertension    Obesity    OSA (obstructive sleep apnea)    Paroxysmal atrial fibrillation ([place])    Past Surgical History:  Procedure Laterality Date   ATRIAL FIBRILLATION ABLATION N/A 01/02/2022   Procedure: ATRIAL FIBRILLATION ABLATION;  Surgeon: Vickie Epley, MD;  Location: [facility];  Service: Cardiovascular;  Laterality: N/A;   CARDIAC CATHETERIZATION     CARDIOVERSION N/A 09/10/2020   Procedure: CARDIOVERSION;  Surgeon: Lelon Perla, MD;  Location: [facility]OSCOPY;  Service: Cardiovascular;  Laterality: N/A;   FINGER SURGERY     left 1st finger   TEE WITHOUT CARDIOVERSION  01/02/2022   Procedure: TRANSESOPHAGEAL ECHOCARDIOGRAM (TEE);  Surgeon: Vickie Epley, MD;  Location: [facility];  Service: Cardiovascular;;    Current Outpatient Medications  Medication Sig Dispense Refill   benazepril (LOTENSIN) 20 MG tablet TAKE 1 TABLET BY MOUTH DAILY 90 tablet 3   cetirizine (ZYRTEC) 10 MG tablet Take 10 mg by mouth daily.     Dulaglutide (TRULICITY) A999333 0000000 SOPN Inject 0.75 mg into the skin once a week. 2  mL 2   fluticasone (FLONASE) 50 MCG/ACT nasal spray Place 2 sprays into both nostrils daily. (Patient taking differently: Place 2 sprays into both nostrils as needed.) 16 g 5   hydrochlorothiazide (HYDRODIURIL) 12.5 MG tablet TAKE 1 TABLET BY MOUTH  DAILY 90 tablet 3   metFORMIN (GLUCOPHAGE-XR) 500 MG 24 hr tablet TAKE 2 TABLETS BY MOUTH  BEFORE BREAKFAST AND 1  TABLET BY MOUTH WITH SUPPER 270 tablet 3   metoprolol succinate (TOPROL-XL) 100 MG 24 hr tablet TAKE 1 TABLET BY MOUTH  DAILY WITH OR IMMEDIATELY  FOLLOWING A MEAL 90 tablet 3    Multiple Vitamin (MULTIVITAMIN) tablet Take 1 tablet by mouth daily.     Omega-3 Fatty Acids (FISH OIL) 1200 MG CAPS Take 2 capsules (2,400 mg total) by mouth 2 (two) times daily.     pravastatin (PRAVACHOL) 40 MG tablet TAKE 1 TABLET BY MOUTH DAILY 90 tablet 3   sildenafil (VIAGRA) 100 MG tablet TAKE 1 TABLET BY MOUTH ONCE DAILY AS NEEDED FOR ERECTILE DYSFUNCTION 10 tablet 0   warfarin (COUMADIN) 10 MG tablet TAKE 1/2 TO 1 TABLET BY MOUTH  DAILY AS DIRECTED BY COUMADIN  CLINIC 90 tablet 3   diltiazem (CARDIZEM CD) 240 MG 24 hr capsule Take 1 capsule (240 mg total) by mouth daily. 30 capsule 3   No current facility-administered medications for this encounter.    No Known Allergies  Social History   Socioeconomic History   Marital status: Married    Spouse name: Not on file   Number of children: Not on file   Years of education: Not on file   Highest education level: Not on file  Occupational History   Not on file  Tobacco Use   Smoking status: Former    Types: Cigarettes    Quit date: 05/26/1994    Years since quitting: 28.1   Smokeless tobacco: Never   Tobacco comments:    Former smoker  quit in 1995 01/30/22  Vaping Use   Vaping Use: Never used  Substance and Sexual Activity   Alcohol use: Not Currently    Alcohol/week: 5.0 standard drinks of alcohol    Types: 5 Cans of beer per week   Drug use: No   Sexual activity: Yes  Other Topics Concern   Not on file  Social History Narrative   Not on file   Social Determinants of Health   Financial Resource Strain: Not on file  Food Insecurity: Not on file  Transportation Needs: Not on file  Physical Activity: Not on file  Stress: Not on file  Social Connections: Not on file  Intimate Partner Violence: Not on file     ROS- All systems are reviewed and negative except as per the HPI above.  Physical Exam: Vitals:   07/07/22 1511  BP: (!) 148/94  Pulse: (!) 112  Weight: 118.6 kg  Height: 5' 10"$  (1.778 m)      GEN- The patient is a well appearing obese male, alert and oriented x 3 today.   HEENT-head normocephalic, atraumatic, sclera clear, conjunctiva pink, hearing intact, trachea midline. Lungs- Clear to ausculation bilaterally, normal work of breathing Heart- irregular rate and rhythm, no murmurs, rubs or gallops  GI- soft, NT, ND, + BS Extremities- no clubbing, cyanosis, or edema MS- no significant deformity or atrophy Skin- no rash or lesion Psych- euthymic mood, full affect Neuro- strength and sensation are intact   Wt Readings from Last 3 Encounters:  07/07/22 118.6 kg  07/04/22  117.9 kg  04/30/22 119.1 kg    EKG today demonstrates  Afib Vent. rate 112 BPM PR interval * ms QRS duration 90 ms QT/QTcB 316/431 ms  Echo 04/08/22 demonstrated   1. Left ventricular ejection fraction, by estimation, is 60 to 65%. The  left ventricle has normal function. The left ventricle has no regional  wall motion abnormalities. There is mild concentric left ventricular  hypertrophy. Left ventricular diastolic  parameters were normal.   2. Right ventricular systolic function is normal. The right ventricular  size is normal. There is normal pulmonary artery systolic pressure.   3. Left atrial size was moderately dilated.   4. No evidence of mitral valve regurgitation.   5. Aortic valve regurgitation is not visualized.   6. The inferior vena cava is normal in size with greater than 50%  respiratory variability, suggesting right atrial pressure of 3 mmHg.   Comparison(s): No significant change from prior study.   Epic records are reviewed at length today  CHA2DS2-VASc Score = 3  The patient's score is based upon: CHF History: 1 HTN History: 1 Diabetes History: 1 Stroke History: 0 Vascular Disease History: 0 Age Score: 0 Gender Score: 0       ASSESSMENT AND PLAN: 1. Persistent Atrial Fibrillation (ICD10:  I48.19) The patient's CHA2DS2-VASc score is 3, indicating a 3.2% annual  risk of stroke.   S/p afib ablation 01/20/22 S/p DCCV 07/04/22 with quick return of afib. We discussed rhythm control options including AAD and repeat ablation. Patient would like to pursue dofetilide.  Continue warfarin, he will continue  No recent benadryl use PharmD to screen medications. He will need to stop HCTZ 3 days before admission.  QTc in SR 451 ms Will check magnesium today. Increase diltiazem to 240 mg daily Continue Toprol 100 mg daily Continue warfarin. Continue weekly INR leading up to admission.   2. Secondary Hypercoagulable State (ICD10:  D68.69) The patient is at significant risk for stroke/thromboembolism based upon his CHA2DS2-VASc Score of 3.  Continue Warfarin (Coumadin).   3. Obesity Body mass index is 37.51 kg/m. Lifestyle modification was discussed and encouraged including regular physical activity and weight reduction.  4. Obstructive sleep apnea Encouraged compliance with CPAP therapy.  5. HTN Borderline elevated, increase diltiazem as above and stop HCTZ 3 days before admission.   6. Systolic dysfunction EF A999333 on TEE at time of ablation. Repeat echo showed EF 60-65% Fluid status appears stable.   Follow up in the AF clinic 07/29/22 for dofetilide admission.    [facility] [address] [place], [place] [address] [phone] 07/07/2022 4:16 PM

## 2022-07-07 NOTE — Telephone Encounter (Signed)
Medication list reviewed in anticipation of upcoming Tikosyn initiation. Patient is not taking any contraindicated or QTc prolonging medications. Patient is aware to stop taking HCTZ at least 3 days prior to admission.   Patient is anticoagulated on warfarin. He will need 3 therapeutic weekly INR prior to admission and 1 therapeutic INR the day of admission.   Patient will need to be counseled to avoid use of Benadryl while on Tikosyn and in the 2-3 days prior to Tikosyn initiation.

## 2022-07-09 ENCOUNTER — Ambulatory Visit (INDEPENDENT_AMBULATORY_CARE_PROVIDER_SITE_OTHER): Payer: 59

## 2022-07-09 DIAGNOSIS — I480 Paroxysmal atrial fibrillation: Secondary | ICD-10-CM

## 2022-07-09 DIAGNOSIS — Z5181 Encounter for therapeutic drug level monitoring: Secondary | ICD-10-CM | POA: Diagnosis not present

## 2022-07-09 LAB — POCT INR: INR: 2.7 (ref 2.0–3.0)

## 2022-07-09 NOTE — Patient Instructions (Signed)
Description   Call wife phone at [phone]. Spoke to pt's wife, on [place], and instructed for pt to continue taking 1 tablet daily EXCEPT 0.5 tablet on Sundays, Wednesdays, and Fridays. Recheck INR in 1 week - Needs weekly checks for Tikosyn Admission on 07/29/22  *Please discuss DOAC option with spouse during next INR check - note on file to discuss after insurance change*  Holding off for now.

## 2022-07-11 ENCOUNTER — Encounter (HOSPITAL_COMMUNITY): Payer: Self-pay

## 2022-07-11 ENCOUNTER — Telehealth (HOSPITAL_COMMUNITY): Payer: Self-pay

## 2022-07-11 NOTE — Telephone Encounter (Signed)
Initiated prior authorization for [name] preadmission. Date of service 07/29/2022 Ref # [id number] Authorization is pending.  Faxed clinical information to [facility].  [phone]

## 2022-07-13 ENCOUNTER — Encounter: Payer: Self-pay | Admitting: Cardiology

## 2022-07-14 ENCOUNTER — Other Ambulatory Visit (HOSPITAL_COMMUNITY): Payer: Self-pay | Admitting: *Deleted

## 2022-07-14 MED ORDER — DILTIAZEM HCL 30 MG PO TABS: ORAL_TABLET | ORAL | 1 refills | Status: DC

## 2022-07-16 ENCOUNTER — Ambulatory Visit (INDEPENDENT_AMBULATORY_CARE_PROVIDER_SITE_OTHER): Payer: 59

## 2022-07-16 DIAGNOSIS — Z5181 Encounter for therapeutic drug level monitoring: Secondary | ICD-10-CM

## 2022-07-16 DIAGNOSIS — I480 Paroxysmal atrial fibrillation: Secondary | ICD-10-CM

## 2022-07-16 LAB — POCT INR: INR: 2.4 (ref 2.0–3.0)

## 2022-07-16 NOTE — Patient Instructions (Signed)
Description   Call wife phone at [phone]. Spoke to pt's wife, on [place], and instructed for pt to continue taking 1 tablet daily EXCEPT 0.5 tablet on Sundays, Wednesdays, and Fridays. Recheck INR in 1 week - Needs weekly checks for Tikosyn Admission on 07/29/22  *Please discuss DOAC option with spouse during next INR check - note on file to discuss after insurance change*  Holding off for now.

## 2022-07-20 ENCOUNTER — Other Ambulatory Visit: Payer: Self-pay | Admitting: Family Medicine

## 2022-07-23 ENCOUNTER — Ambulatory Visit (INDEPENDENT_AMBULATORY_CARE_PROVIDER_SITE_OTHER): Payer: 59

## 2022-07-23 DIAGNOSIS — I480 Paroxysmal atrial fibrillation: Secondary | ICD-10-CM

## 2022-07-23 DIAGNOSIS — Z5181 Encounter for therapeutic drug level monitoring: Secondary | ICD-10-CM

## 2022-07-23 LAB — POCT INR: INR: 2.5 (ref 2.0–3.0)

## 2022-07-23 NOTE — Patient Instructions (Signed)
Description   Call wife phone at [phone]. Spoke to pt's wife, on [place], and instructed for pt to continue taking 1 tablet daily EXCEPT 0.5 tablet on Sundays, Wednesdays, and Fridays. Recheck INR on 07/29/22 am of Tikosyn Admission.  Has appt at AF clinic at 10:30 on 07/29/22 for Tikosyn Prep.  *Please discuss DOAC option with spouse during next INR check - note on file to discuss after insurance change*  Holding off for now.

## 2022-07-25 ENCOUNTER — Encounter (HOSPITAL_COMMUNITY): Payer: Self-pay

## 2022-07-29 ENCOUNTER — Ambulatory Visit (HOSPITAL_COMMUNITY): Payer: 59 | Admitting: Physician Assistant

## 2022-07-29 ENCOUNTER — Observation Stay: Admit: 2022-07-29 | Payer: 59

## 2022-07-29 ENCOUNTER — Ambulatory Visit (INDEPENDENT_AMBULATORY_CARE_PROVIDER_SITE_OTHER): Payer: 59 | Admitting: Cardiology

## 2022-07-29 DIAGNOSIS — I4819 Other persistent atrial fibrillation: Secondary | ICD-10-CM

## 2022-07-29 DIAGNOSIS — Z5181 Encounter for therapeutic drug level monitoring: Secondary | ICD-10-CM | POA: Diagnosis not present

## 2022-07-29 LAB — POCT INR: INR: 1.8 — AB (ref 2.0–3.0)

## 2022-07-29 NOTE — Patient Instructions (Signed)
Description   Call wife phone at [phone], Pt's wife, on [place].  Pt takes warfarin in the evening. Pt is going to appointment today at AF clinic at 10:30 and should be admitted for [place] admission. Informed wife to let the providers know what pt's INR is and his warfarin should be managed by the pharmacy while he is in the hospital and to follow those instructions. However, if he dose not end up being admitted pt should take 1.5 tablets of warfarin today and then go back to his normal dose of warfarin 1 tablet daily except for 1/2 a tablet on Sunday, Wednesday and Friday. Recheck INR in 1 week. Coumadin Clinic [phone] *Please discuss DOAC option with spouse during next INR check - note on file to discuss after insurance change*  Holding off for now.

## 2022-07-30 ENCOUNTER — Telehealth: Payer: Self-pay | Admitting: Cardiology

## 2022-07-30 NOTE — Telephone Encounter (Signed)
Spoke with patient's wife yesterday regarding admission for tikosyn being canceled due to INR of 1.8. They were planning to restart weekly INR checks next Wednesday and would reschedule tikosyn admission once therapeutic in 4 weeks (This is why new appt is not set yet). Will forward to Dr. Quentin Ore regarding ?repeat ablation instead of Tikosyn admission.

## 2022-07-30 NOTE — Telephone Encounter (Signed)
Spoke with the patient who states that his Tikosyn admission was cancelled yesterday due to his INR being low. He is wondering what his next steps should be and if an ablation is still a possible option for him.

## 2022-07-30 NOTE — Telephone Encounter (Signed)
Pt is calling to speak about ablation he was scheduled to have but was cancelled due to INR reading. Requesting call back from Dr. Mardene Speak nurse.

## 2022-08-05 ENCOUNTER — Ambulatory Visit (INDEPENDENT_AMBULATORY_CARE_PROVIDER_SITE_OTHER): Payer: 59

## 2022-08-05 DIAGNOSIS — I480 Paroxysmal atrial fibrillation: Secondary | ICD-10-CM

## 2022-08-05 DIAGNOSIS — Z5181 Encounter for therapeutic drug level monitoring: Secondary | ICD-10-CM

## 2022-08-05 LAB — POCT INR: INR: 1.8 — AB (ref 2.0–3.0)

## 2022-08-05 NOTE — Patient Instructions (Signed)
Description   Call wife phone at [phone], Pt's wife, on [place]. Called spoke with pt's wife, she states pt did not get admitted last week for Tikosyn start given subtherapeutic INR. Pt will need 4 weekly therapeutic INR's to be admitted for Tikosyn start.  Advised to have pt take 1.5 tablets of warfarin today and then start taking Warfarin 1 tablet daily except for 1/2 a tablet on Sundays and Fridays. Recheck INR in 1 week. Coumadin Clinic [phone] *Please discuss DOAC option with spouse during next INR check - note on file to discuss after insurance change*  Holding off for now.

## 2022-08-11 NOTE — Telephone Encounter (Signed)
Awaiting INR to be therapeutic to restart 4 weekly INR checks. INR on 3/12 was 1.8

## 2022-08-12 ENCOUNTER — Ambulatory Visit (INDEPENDENT_AMBULATORY_CARE_PROVIDER_SITE_OTHER): Payer: 59 | Admitting: Cardiovascular Disease

## 2022-08-12 ENCOUNTER — Telehealth: Payer: Self-pay | Admitting: *Deleted

## 2022-08-12 DIAGNOSIS — Z5181 Encounter for therapeutic drug level monitoring: Secondary | ICD-10-CM | POA: Diagnosis not present

## 2022-08-12 DIAGNOSIS — I480 Paroxysmal atrial fibrillation: Secondary | ICD-10-CM | POA: Diagnosis not present

## 2022-08-12 LAB — POCT INR: INR: 2.4 (ref 2.0–3.0)

## 2022-08-12 NOTE — Telephone Encounter (Signed)
Called pt since INR is due; left a message for pt/wife to call back regarding this.

## 2022-08-12 NOTE — Patient Instructions (Addendum)
Description   Call wife phone at [phone], Pt's wife, on [place]. Called spoke with pt's wife, she states pt did not get admitted  (07/29/2022) for Tikosyn start given subtherapeutic INR. Pt will need 4 weekly therapeutic INR's to be admitted for Tikosyn start.  Advised pt to continue taking Warfarin 1 tablet daily except for 1/2 a tablet on Sundays and Fridays. Recheck INR in 1 week. Coumadin Clinic [phone] *Please discuss DOAC option with spouse during next INR check - note on file to discuss after insurance change*  Holding off for now.

## 2022-08-14 ENCOUNTER — Other Ambulatory Visit: Payer: Self-pay | Admitting: *Deleted

## 2022-08-14 DIAGNOSIS — I480 Paroxysmal atrial fibrillation: Secondary | ICD-10-CM

## 2022-08-15 ENCOUNTER — Encounter (HOSPITAL_COMMUNITY): Payer: Self-pay

## 2022-08-15 MED ORDER — DILTIAZEM HCL ER COATED BEADS 240 MG PO CP24: 240.0000 mg | ORAL_CAPSULE | Freq: Every day | ORAL | 1 refills | Status: DC

## 2022-08-18 ENCOUNTER — Other Ambulatory Visit: Payer: Self-pay | Admitting: Family Medicine

## 2022-08-19 ENCOUNTER — Ambulatory Visit (INDEPENDENT_AMBULATORY_CARE_PROVIDER_SITE_OTHER): Payer: 59

## 2022-08-19 DIAGNOSIS — I480 Paroxysmal atrial fibrillation: Secondary | ICD-10-CM | POA: Diagnosis not present

## 2022-08-19 DIAGNOSIS — Z5181 Encounter for therapeutic drug level monitoring: Secondary | ICD-10-CM | POA: Diagnosis not present

## 2022-08-19 LAB — POCT INR: INR: 2.7 (ref 2.0–3.0)

## 2022-08-19 NOTE — Patient Instructions (Signed)
Description   Called spoke with pt's wife, she states pt will need 4 weekly therapeutic INR's to be admitted for Tikosyn start.  Advised pt to continue taking Warfarin 1 tablet daily except for 1/2 a tablet on Sundays and Fridays.  Recheck INR in 1 week.  Coumadin Clinic [phone] *Please discuss DOAC option with spouse during next INR check - note on file to discuss after insurance change*  Holding off for now.

## 2022-08-26 ENCOUNTER — Ambulatory Visit (INDEPENDENT_AMBULATORY_CARE_PROVIDER_SITE_OTHER): Payer: 59

## 2022-08-26 DIAGNOSIS — I480 Paroxysmal atrial fibrillation: Secondary | ICD-10-CM | POA: Diagnosis not present

## 2022-08-26 DIAGNOSIS — Z5181 Encounter for therapeutic drug level monitoring: Secondary | ICD-10-CM

## 2022-08-26 LAB — POCT INR: INR: 3.2 — AB (ref 2.0–3.0)

## 2022-08-26 NOTE — Patient Instructions (Signed)
Description   Called spoke with pt's wife, she states pt will need 4 weekly therapeutic INR's to be admitted for Tikosyn start.  Advised pt to only take 1/2 tablet today and then continue taking Warfarin 1 tablet daily except for 1/2 a tablet on Sundays and Fridays.  Recheck INR in 1 week.  C[facility]in Clinic [phone] *Please discuss DOAC option with spouse during next INR check - note on file to discuss after insurance change*  Holding off for now.

## 2022-08-28 ENCOUNTER — Other Ambulatory Visit (HOSPITAL_COMMUNITY): Payer: Self-pay | Admitting: *Deleted

## 2022-08-28 DIAGNOSIS — I480 Paroxysmal atrial fibrillation: Secondary | ICD-10-CM

## 2022-08-28 MED ORDER — DILTIAZEM HCL ER COATED BEADS 240 MG PO CP24: 240.0000 mg | ORAL_CAPSULE | Freq: Every day | ORAL | 1 refills | Status: DC

## 2022-09-02 ENCOUNTER — Ambulatory Visit (INDEPENDENT_AMBULATORY_CARE_PROVIDER_SITE_OTHER): Payer: 59 | Admitting: Pharmacist

## 2022-09-02 DIAGNOSIS — I480 Paroxysmal atrial fibrillation: Secondary | ICD-10-CM

## 2022-09-02 DIAGNOSIS — Z5181 Encounter for therapeutic drug level monitoring: Secondary | ICD-10-CM | POA: Diagnosis not present

## 2022-09-02 LAB — POCT INR: INR: 2 (ref 2.0–3.0)

## 2022-09-02 NOTE — Progress Notes (Signed)
LMOM

## 2022-09-02 NOTE — Patient Instructions (Addendum)
    Description   Spoke with pt's wife, she states pt will need 4 weekly therapeutic INR's to be admitted for Tikosyn start.  Advised pt to take 1.5 tablets today and then continue taking Warfarin 1 tablet daily except for 1/2 a tablet on Sundays and Fridays.  Recheck INR in 1 week on 4/16, pending Tikosyn start admission that day if INR therapeutic.  C[facility]in Clinic [phone] *Please discuss DOAC option with spouse during next INR check - note on file to discuss after insurance change*  Holding off for now.

## 2022-09-05 ENCOUNTER — Telehealth (HOSPITAL_COMMUNITY): Payer: Self-pay | Admitting: *Deleted

## 2022-09-05 ENCOUNTER — Encounter (HOSPITAL_COMMUNITY): Payer: Self-pay

## 2022-09-05 NOTE — Telephone Encounter (Signed)
Per Cassandra with [employer] - no authorization can be done until after patient is actually admitted to the hospital. No pre-auth is required. Ref number is [id number].

## 2022-09-06 ENCOUNTER — Other Ambulatory Visit (HOSPITAL_COMMUNITY): Payer: Self-pay | Admitting: Physician Assistant

## 2022-09-09 ENCOUNTER — Other Ambulatory Visit: Payer: Self-pay

## 2022-09-09 ENCOUNTER — Ambulatory Visit (HOSPITAL_COMMUNITY)
Admission: RE | Admit: 2022-09-09 | Discharge: 2022-09-09 | Disposition: A | Discharge status: Home / Self Care | Payer: 59 | Source: Ambulatory Visit | Attending: Physician Assistant | Admitting: Physician Assistant

## 2022-09-09 ENCOUNTER — Encounter (HOSPITAL_COMMUNITY): Payer: Self-pay | Admitting: Physician Assistant

## 2022-09-09 ENCOUNTER — Inpatient Hospital Stay (HOSPITAL_COMMUNITY)
Admission: AD | Admit: 2022-09-09 | Discharge: 2022-09-12 | DRG: 309 | Disposition: A | Discharge status: Home / Self Care | Payer: 59 | Source: Ambulatory Visit | Attending: Cardiology | Admitting: Cardiology

## 2022-09-09 ENCOUNTER — Other Ambulatory Visit (HOSPITAL_COMMUNITY): Payer: Self-pay

## 2022-09-09 ENCOUNTER — Ambulatory Visit (INDEPENDENT_AMBULATORY_CARE_PROVIDER_SITE_OTHER): Payer: 59

## 2022-09-09 VITALS — BP 126/92 | HR 113 | Ht 70.0 in | Wt 265.4 lb

## 2022-09-09 DIAGNOSIS — I4819 Other persistent atrial fibrillation: Secondary | ICD-10-CM | POA: Diagnosis present

## 2022-09-09 DIAGNOSIS — Z87891 Personal history of nicotine dependence: Secondary | ICD-10-CM | POA: Diagnosis not present

## 2022-09-09 DIAGNOSIS — I5032 Chronic diastolic (congestive) heart failure: Secondary | ICD-10-CM | POA: Diagnosis present

## 2022-09-09 DIAGNOSIS — I480 Paroxysmal atrial fibrillation: Secondary | ICD-10-CM | POA: Diagnosis not present

## 2022-09-09 DIAGNOSIS — Z6838 Body mass index (BMI) 38.0-38.9, adult: Secondary | ICD-10-CM

## 2022-09-09 DIAGNOSIS — Z7984 Long term (current) use of oral hypoglycemic drugs: Secondary | ICD-10-CM

## 2022-09-09 DIAGNOSIS — I110 Hypertensive heart disease with heart failure: Secondary | ICD-10-CM | POA: Diagnosis present

## 2022-09-09 DIAGNOSIS — Z7985 Long-term (current) use of injectable non-insulin antidiabetic drugs: Secondary | ICD-10-CM

## 2022-09-09 DIAGNOSIS — Z8249 Family history of ischemic heart disease and other diseases of the circulatory system: Secondary | ICD-10-CM

## 2022-09-09 DIAGNOSIS — D6869 Other thrombophilia: Secondary | ICD-10-CM | POA: Diagnosis present

## 2022-09-09 DIAGNOSIS — Z7901 Long term (current) use of anticoagulants: Secondary | ICD-10-CM

## 2022-09-09 DIAGNOSIS — M542 Cervicalgia: Secondary | ICD-10-CM | POA: Diagnosis present

## 2022-09-09 DIAGNOSIS — E119 Type 2 diabetes mellitus without complications: Secondary | ICD-10-CM | POA: Diagnosis present

## 2022-09-09 DIAGNOSIS — G4733 Obstructive sleep apnea (adult) (pediatric): Secondary | ICD-10-CM | POA: Diagnosis present

## 2022-09-09 DIAGNOSIS — N529 Male erectile dysfunction, unspecified: Secondary | ICD-10-CM | POA: Diagnosis present

## 2022-09-09 DIAGNOSIS — E669 Obesity, unspecified: Secondary | ICD-10-CM | POA: Diagnosis present

## 2022-09-09 DIAGNOSIS — Z5181 Encounter for therapeutic drug level monitoring: Secondary | ICD-10-CM | POA: Diagnosis not present

## 2022-09-09 DIAGNOSIS — E785 Hyperlipidemia, unspecified: Secondary | ICD-10-CM | POA: Diagnosis present

## 2022-09-09 DIAGNOSIS — Z79899 Other long term (current) drug therapy: Secondary | ICD-10-CM | POA: Diagnosis not present

## 2022-09-09 LAB — BASIC METABOLIC PANEL
Anion gap: 9 (ref 5–15)
BUN: 22 mg/dL — ABNORMAL HIGH (ref 6–20)
CO2: 20 mmol/L — ABNORMAL LOW (ref 22–32)
Calcium: 8.9 mg/dL (ref 8.9–10.3)
Chloride: 106 mmol/L (ref 98–111)
Creatinine, Ser: 0.98 mg/dL (ref 0.61–1.24)
GFR, Estimated: >60 mL/min (ref >60)
Glucose, Bld: 196 mg/dL — ABNORMAL HIGH (ref 70–99)
Potassium: 4.2 mmol/L (ref 3.5–5.1)
Sodium: 135 mmol/L (ref 135–145)

## 2022-09-09 LAB — HIV ANTIBODY (ROUTINE TESTING W REFLEX): HIV Screen 4th Generation wRfx: NONREACTIVE

## 2022-09-09 LAB — POCT INR: INR: 2.5 (ref 2.0–3.0)

## 2022-09-09 LAB — MAGNESIUM: Magnesium: 1.8 mg/dL (ref 1.7–2.4)

## 2022-09-09 MED ORDER — METOPROLOL SUCCINATE ER 100 MG PO TB24
100.0000 mg | ORAL_TABLET | Freq: Every day | ORAL | Status: DC
  Administered 2022-09-10 – 2022-09-12 (×3): 100 mg via ORAL
  Filled 2022-09-09 (×3): qty 1

## 2022-09-09 MED ORDER — SODIUM CHLORIDE 0.9% FLUSH
3.0000 mL | Freq: Two times a day (BID) | INTRAVENOUS | Status: DC
  Administered 2022-09-09 – 2022-09-12 (×6): 3 mL via INTRAVENOUS

## 2022-09-09 MED ORDER — MAGNESIUM SULFATE 2 GM/50ML IV SOLN
2.0000 g | Freq: Once | INTRAVENOUS | Status: AC
  Administered 2022-09-09: 2 g via INTRAVENOUS
  Filled 2022-09-09: qty 50

## 2022-09-09 MED ORDER — BENAZEPRIL HCL 20 MG PO TABS
20.0000 mg | ORAL_TABLET | Freq: Every day | ORAL | Status: DC
  Administered 2022-09-10 – 2022-09-12 (×3): 20 mg via ORAL
  Filled 2022-09-09 (×3): qty 1

## 2022-09-09 MED ORDER — WARFARIN - PHARMACIST DOSING INPATIENT: Freq: Every day | Status: DC

## 2022-09-09 MED ORDER — METFORMIN HCL ER 500 MG PO TB24
500.0000 mg | ORAL_TABLET | Freq: Two times a day (BID) | ORAL | Status: DC
  Administered 2022-09-09 – 2022-09-12 (×6): 500 mg via ORAL
  Filled 2022-09-09 (×7): qty 1

## 2022-09-09 MED ORDER — DILTIAZEM HCL 30 MG PO TABS: 30.0000 mg | ORAL_TABLET | ORAL | Status: DC | PRN

## 2022-09-09 MED ORDER — LORATADINE 10 MG PO TABS
10.0000 mg | ORAL_TABLET | Freq: Every day | ORAL | Status: DC
  Administered 2022-09-10 – 2022-09-12 (×2): 10 mg via ORAL
  Filled 2022-09-09 (×3): qty 1

## 2022-09-09 MED ORDER — FLUTICASONE PROPIONATE 50 MCG/ACT NA SUSP: 2.0000 | NASAL | Status: DC | PRN

## 2022-09-09 MED ORDER — PRAVASTATIN SODIUM 40 MG PO TABS
40.0000 mg | ORAL_TABLET | Freq: Every day | ORAL | Status: DC
  Administered 2022-09-10 – 2022-09-12 (×3): 40 mg via ORAL
  Filled 2022-09-09 (×3): qty 1

## 2022-09-09 MED ORDER — SODIUM CHLORIDE 0.9% FLUSH: 3.0000 mL | INTRAVENOUS | Status: DC | PRN

## 2022-09-09 MED ORDER — DOFETILIDE 500 MCG PO CAPS
500.0000 ug | ORAL_CAPSULE | Freq: Two times a day (BID) | ORAL | Status: DC
  Administered 2022-09-09 – 2022-09-12 (×6): 500 ug via ORAL
  Filled 2022-09-09 (×6): qty 1

## 2022-09-09 MED ORDER — SODIUM CHLORIDE 0.9 % IV SOLN: 250.0000 mL | INTRAVENOUS | Status: DC | PRN

## 2022-09-09 MED ORDER — WARFARIN SODIUM 5 MG PO TABS
10.0000 mg | ORAL_TABLET | Freq: Once | ORAL | Status: AC
  Administered 2022-09-09: 10 mg via ORAL
  Filled 2022-09-09: qty 2

## 2022-09-09 MED ORDER — DILTIAZEM HCL ER COATED BEADS 240 MG PO CP24
240.0000 mg | ORAL_CAPSULE | Freq: Every day | ORAL | Status: DC
  Administered 2022-09-10 – 2022-09-12 (×3): 240 mg via ORAL
  Filled 2022-09-09 (×3): qty 1

## 2022-09-09 NOTE — Progress Notes (Signed)
ANTICOAGULATION CONSULT NOTE  Pharmacy Consult for warfarin Indication: atrial fibrillation  No Known Allergies  Patient Measurements: Height:  (177.8 cm) Weight: 120.2 kg (264 lb 14.4 oz) IBW/kg (Calculated) : 73  Vital Signs: Temp: 98.6 F (37 C) (04/16 1202) Temp Source: Oral (04/16 1202) BP: 108/86 (04/16 1202) Pulse Rate: 63 (04/16 1202)  Labs: Recent Labs    09/09/22 0000 09/09/22 0939  INR 2.5  --   CREATININE  --  0.98    Estimated Creatinine Clearance: 105.5 mL/min (by C-G formula based on SCr of 0.98 mg/dL).   Medical History: Past Medical History:  Diagnosis Date   DM II (diabetes mellitus, type II), controlled    Erectile dysfunction    Hyperlipidemia    Hypertension    Obesity    OSA (obstructive sleep apnea)    Paroxysmal atrial fibrillation       Assessment: 32 yoM with hx AF on warfarin PTA admitted for tikosyn initiation. INR is therapeutic at 2.5 and has remained therapeutic x4 weeks pre-admit. Home warfarin dose is  daily except  Sun/Fri, last dose 4/15.  Goal of Therapy:  INR 2-3 Monitor platelets by anticoagulation protocol: Yes   Plan:  Warfarin  PO x1 tonight Daily INR  Fredonia Highland, PharmD, BCPS, [facility] Clinical Pharmacist [phone] Please check AMION for all [facility] numbers 09/09/2022

## 2022-09-09 NOTE — Care Management (Signed)
  Transition of Care [facility]) Screening Note   Patient Details  Name: Daniel Grant Date of Birth: 09-04-1965   Transition of Care [facility]) CM/SW Contact:    Gala Lewandowsky, RN Phone Number: 09/09/2022, 12:26 PM    Transition of Care Department [facility]) has reviewed the patient. Patient presented for Tikosyn Load. Benefits check submitted for cost. Case Manager will discuss cost and pharmacy of choice as the patient progresses.

## 2022-09-09 NOTE — TOC Benefit Eligibility Note (Signed)
Patient Product/process development scientist completed.    The patient is currently admitted and upon discharge could be taking dofetilide (Tikosyn) 500 mcg capsule.  The current 30 day co-pay is $60.00.   The patient is insured through [employer]   This test claim was processed through [employer]- copay amounts may vary at other pharmacies due to [employer], or as the patient moves through the different stages of their insurance plan.  Roland Earl, CPHT Pharmacy Patient Advocate Specialist [facility] Patient Advocate Team Direct Number: [phone]  Fax: [phone]

## 2022-09-09 NOTE — H&P (Signed)
Primary Care Physician: Sheliah Hatch, MD Primary Cardiologist: Dr Jens Som Primary Electrophysiologist: Dr Lalla Brothers Referring Physician: Dr Barbarann Ehlers is a 57 y.o. male with a history of DM, HTN, HLD, OSA, atrial fibrillation who presents for follow up in the [facility]. Patient is on warfarin for a CHADS2VASC score of 3. Patient is s/p afib ablation with Dr Lalla Brothers on 01/02/22. TEE at the time of ablation showed decreased systolic function with EF 40-45%.   Patient was seen at the ED with neck pain and found to be back in rapid afib. Neck pain was felt to be trap spasm. He was discharged from the ED in afib. Patient has converted back to SR before his follow up on 01/30/22 but went out of rhythm again and is s/p DCCV 07/04/22. He has a Nutritional therapist which showed he was back out of rhythm within 24 hours.    On follow up today, patient remains in afib today. He has had 4 therapeutic INR levels, 2.5 today. He discontinued HCTZ last Friday. He skipped his dose of Trulicity on Sunday.    Today, he denies symptoms of chest pain, shortness of breath, orthopnea, PND, lower extremity edema, dizziness, presyncope, syncope, snoring, daytime somnolence, bleeding, or neurologic sequela. The patient is tolerating medications without difficulties and is otherwise without complaint today.      Atrial Fibrillation Risk Factors:   he does have symptoms or diagnosis of sleep apnea. he is compliant with CPAP therapy. he does not have a history of rheumatic fever.     he has a BMI of Body mass index is 38.08 kg/m.Marland Kitchen    Filed Weights    09/09/22 0922  Weight: 120.4 kg           Family History  Problem Relation Age of Onset   Heart attack Paternal Uncle     Heart attack Maternal Grandmother     Heart disease Mother     Hypertension Mother     Diabetes Mother     Alcohol abuse Father     Cancer Neg Hx     Early death Neg Hx      Hyperlipidemia Neg Hx     Kidney disease Neg Hx     Stroke Neg Hx          Atrial Fibrillation Management history:   Previous antiarrhythmic drugs: none Previous cardioversions: 2022, 07/04/22 Previous ablations: 01/02/22 CHADS2VASC score: 3 Anticoagulation history: warfarin          Past Medical History:  Diagnosis Date   DM II (diabetes mellitus, type II), controlled     Erectile dysfunction     Hyperlipidemia     Hypertension     Obesity     OSA (obstructive sleep apnea)     Paroxysmal atrial fibrillation           Past Surgical History:  Procedure Laterality Date   ATRIAL FIBRILLATION ABLATION N/A 01/02/2022    Procedure: ATRIAL FIBRILLATION ABLATION;  Surgeon: Lanier Prude, MD;  Location: [facility] INVASIVE CV LAB;  Service: Cardiovascular;  Laterality: N/A;   CARDIAC CATHETERIZATION       CARDIOVERSION N/A 09/10/2020    Procedure: CARDIOVERSION;  Surgeon: Lewayne Bunting, MD;  Location: [facility] ENDOSCOPY;  Service: Cardiovascular;  Laterality: N/A;   CARDIOVERSION N/A 07/04/2022    Procedure: CARDIOVERSION;  Surgeon: Jodelle Red, MD;  Location: [facility] ENDOSCOPY;  Service: Cardiovascular;  Laterality: N/A;  FINGER SURGERY        left 1st finger   TEE WITHOUT CARDIOVERSION   01/02/2022    Procedure: TRANSESOPHAGEAL ECHOCARDIOGRAM (TEE);  Surgeon: Lanier Prude, MD;  Location: [facility] INVASIVE CV LAB;  Service: Cardiovascular;;            Current Outpatient Medications  Medication Sig Dispense Refill   benazepril (LOTENSIN) 20 MG tablet TAKE 1 TABLET BY MOUTH DAILY 90 tablet 3   cetirizine (ZYRTEC) 10 MG tablet Take 10 mg by mouth daily.       diltiazem (CARDIZEM CD) 240 MG 24 hr capsule Take 1 capsule (240 mg total) by mouth daily. 90 capsule 1   diltiazem (CARDIZEM) 30 MG tablet TAKE 1 TABLET BY MOUTH EVERY 4 HOURS AS NEEDED FOR AFIB HEART RATE GREATER THAN 100 AS LONG AS TOP BP GREATER THAN 100 30 tablet 1   fluticasone (FLONASE) 50 MCG/ACT nasal spray Place 2  sprays into both nostrils daily. (Patient taking differently: Place 2 sprays into both nostrils as needed.) 16 g 5   metFORMIN (GLUCOPHAGE-XR) 500 MG 24 hr tablet TAKE 2 TABLETS BY MOUTH  BEFORE BREAKFAST AND 1  TABLET BY MOUTH WITH SUPPER 270 tablet 3   metoprolol succinate (TOPROL-XL) 100 MG 24 hr tablet TAKE 1 TABLET BY MOUTH  DAILY WITH OR IMMEDIATELY  FOLLOWING A MEAL 90 tablet 3   Multiple Vitamin (MULTIVITAMIN) tablet Take 1 tablet by mouth daily.       Omega-3 Fatty Acids (FISH OIL) 1200 MG CAPS Take 2 capsules (2,400 mg total) by mouth 2 (two) times daily.       pravastatin (PRAVACHOL) 40 MG tablet TAKE 1 TABLET BY MOUTH DAILY 90 tablet 3   sildenafil (VIAGRA) 100 MG tablet TAKE 1 TABLET BY MOUTH ONCE DAILY AS NEEDED FOR ERECTILE DYSFUNCTION 10 tablet 0   TRULICITY 0.75 MG/0.5ML SOPN INJECT 0.75 MG SUBCUTANEOUSLY  ONCE A WEEK 4 mL 0   warfarin (COUMADIN) 10 MG tablet TAKE 1/2 TO 1 TABLET BY MOUTH  DAILY AS DIRECTED BY COUMADIN  CLINIC 90 tablet 3    No current facility-administered medications for this encounter.      No Known Allergies   Social History         Socioeconomic History   Marital status: Married      Spouse name: Not on file   Number of children: Not on file   Years of education: Not on file   Highest education level: Not on file  Occupational History   Not on file  Tobacco Use   Smoking status: Former      Types: Cigarettes      Quit date: 05/26/1994      Years since quitting: 28.3   Smokeless tobacco: Never   Tobacco comments:      Former smoker  quit in 1995 01/30/22  Vaping Use   Vaping Use: Never used  Substance and Sexual Activity   Alcohol use: Not Currently      Alcohol/week: 5.0 standard drinks of alcohol      Types: 5 Cans of beer per week   Drug use: No   Sexual activity: Yes  Other Topics Concern   Not on file  Social History Narrative   Not on file    Social Determinants of Health    Financial Resource Strain: Not on file  Food  Insecurity: Not on file  Transportation Needs: Not on file  Physical Activity: Not on file  Stress: Not on  file  Social Connections: Not on file  Intimate Partner Violence: Not on file        ROS- All systems are reviewed and negative except as per the HPI above.   Physical Exam:    Vitals:    09/09/22 0922  BP: (!) 126/92  Pulse: (!) 113  Weight: 120.4 kg  Height: 5\' 10"  (1.778 m)        GEN- The patient is a well appearing male, alert and oriented x 3 today.   HEENT-head normocephalic, atraumatic, sclera clear, conjunctiva pink, hearing intact, trachea midline. Lungs- Clear to ausculation bilaterally, normal work of breathing Heart- irregular rate and rhythm, no murmurs, rubs or gallops  GI- soft, NT, ND, + BS Extremities- no clubbing, cyanosis, or edema MS- no significant deformity or atrophy Skin- no rash or lesion Psych- euthymic mood, full affect Neuro- strength and sensation are intact        Wt Readings from Last 3 Encounters:  09/09/22 120.4 kg  07/07/22 118.6 kg  07/04/22 117.9 kg      EKG today demonstrates  Afib Vent. rate 113 BPM PR interval * ms QRS duration 88 ms QT/QTcB 324/444 ms   Echo 04/08/22 demonstrated   1. Left ventricular ejection fraction, by estimation, is 60 to 65%. The  left ventricle has normal function. The left ventricle has no regional  wall motion abnormalities. There is mild concentric left ventricular  hypertrophy. Left ventricular diastolic  parameters were normal.   2. Right ventricular systolic function is normal. The right ventricular  size is normal. There is normal pulmonary artery systolic pressure.   3. Left atrial size was moderately dilated.   4. No evidence of mitral valve regurgitation.   5. Aortic valve regurgitation is not visualized.   6. The inferior vena cava is normal in size with greater than 50%  respiratory variability, suggesting right atrial pressure of 3 mmHg.   Comparison(s): No significant  change from prior study.    Epic records are reviewed at length today   CHA2DS2-VASc Score = 3  The patient's score is based upon: CHF History: 1 HTN History: 1 Diabetes History: 1 Stroke History: 0 Vascular Disease History: 0 Age Score: 0 Gender Score: 0         ASSESSMENT AND PLAN: 1. Persistent Atrial Fibrillation (ICD10:  I48.19) The patient's CHA2DS2-VASc score is 3, indicating a 3.2% annual risk of stroke.   S/p afib ablation 01/20/22 S/p DCCV 07/04/22 with quick return of afib. Patient presents for dofetilide admission. Continue warfarin, has had 4 therapeutic INR readings. No recent benadryl use PharmD has screened medications, patient discontinued HCTZ 3 days ago. QTc in SR 451 ms Labs today show creatinine at 0.98, K+ 4.2 and mag 1.8, CrCl calculated at 138 mL/min Continue diltiazem 240 mg daily Continue Toprol 100 mg daily   2. Secondary Hypercoagulable State (ICD10:  D68.69) The patient is at significant risk for stroke/thromboembolism based upon his CHA2DS2-VASc Score of 3.  Continue Warfarin (Coumadin).    3. Obesity Body mass index is 38.08 kg/m. Lifestyle modification was discussed and encouraged including regular physical activity and weight reduction.   4. Obstructive sleep apnea Encouraged compliance with CPAP therapy.   5. HTN Stable, no changes today.   6. Systolic dysfunction EF 40-45% on TEE at time of ablation. Repeat echo showed EF 60-65% Appears euvolemic today.      I have seen, examined the patient, and reviewed the above assessment and plan.    Patient  has persistent atrial fibrillation after a prior A-fib ablation in August 2023.  He presents for Tikosyn loading.  His QTc is 442 ms today and his creatinine is acceptable for his initial loading dose of Tikosyn.  Sheria Lang T. Lalla Brothers, MD, [facility], [facility] Cardiac Electrophysiology

## 2022-09-09 NOTE — Patient Instructions (Signed)
Description   Spoke with pt's wife, advised to have pt continue taking Warfarin 1 tablet daily except for 1/2 a tablet on Sundays and Fridays. Pt is being admitted today for Tikosyn start. Recheck INR in 1 week on 4/23,  Coumadin Clinic [phone] *Please discuss DOAC option with spouse during next INR check - note on file to discuss after insurance change*  Holding off for now.

## 2022-09-09 NOTE — Progress Notes (Signed)
Pharmacy: Dofetilide (Tikosyn) - Initial Consult Assessment and Electrolyte Replacement  Pharmacy consulted to assist in monitoring and replacing electrolytes in this 57 y.o. male admitted on 09/09/2022 undergoing dofetilide initiation. First dofetilide dose: 4/16 8PM.  Assessment:  Patient Exclusion Criteria: If any screening criteria checked as "Yes", then  patient  should NOT receive dofetilide until criteria item is corrected.  If "Yes" please indicate correction plan.  YES  NO Patient  Exclusion Criteria Correction Plan       Baseline QTc interval is greater than or equal to 440 msec. IF above YES box checked dofetilide contraindicated unless patient has ICD; then may proceed if QTc 500-550 msec or with known ventricular conduction abnormalities may proceed with QTc 550-600 msec. QTc =  451 EP aware - ok to proceed       Patient is known or suspected to have a digoxin level greater than 2 ng/ml: Lab Results  Component Value Date   DIGOXIN 0.5 (L) 09/15/2012           Creatinine clearance less than 20 ml/min (calculated using Cockcroft-Gault, actual body weight and serum creatinine): Estimated Creatinine Clearance: 105.5 mL/min (by C-G formula based on SCr of 0.98 mg/dL).        Patient has received drugs known to prolong the QT intervals within the last 48 hours (phenothiazines, tricyclics or tetracyclic antidepressants, erythromycin, H-1 antihistamines, cisapride, fluoroquinolones, azithromycin, ondansetron).   Updated information on QT prolonging agents is available to be searched on the following database:QT prolonging agents         Patient received a dose of hydrochlorothiazide (Oretic) alone or in any combination including triamterene (Dyazide, Maxzide) in the last 48 hours.       Patient received a medication known to increase dofetilide plasma concentrations prior to initial dofetilide dose:  Trimethoprim (Primsol, Proloprim) in  the last 36 hours Verapamil (Calan, Verelan) in the last 36 hours or a sustained release dose in the last 72 hours Megestrol (Megace) in the last 5 days  Cimetidine (Tagamet) in the last 6 hours Ketoconazole (Nizoral) in the last 24 hours Itraconazole (Sporanox) in the last 48 hours  Prochlorperazine (Compazine) in the last 36 hours         Patient is known to have a history of torsades de pointes; congenital or acquired long QT syndromes.        Patient has received a Class 1 antiarrhythmic with less than 2 half-lives since last dose. (Disopyramide, Quinidine, Procainamide, Lidocaine, Mexiletine, Flecainide, Propafenone)        Patient has received amiodarone therapy in the past 3 months or amiodarone level is greater than 0.3 ng/ml.    Labs:    Component Value Date/Time   K 4.2 09/09/2022 0939   MG 1.8 09/09/2022 1610     Plan: Select One Calculated CrCl  Dose q12h   > 60 ml/min 500 mcg   40-60 ml/min 250 mcg   20-40 ml/min 125 mcg     Physician selected initial dose within range recommended for patients level of renal function - will monitor for response.    Physician selected initial dose outside of range recommended for patients level of renal function - will discuss if the dose should be altered at this time.   Patient has been appropriately anticoagulated with warfarin, INR therapeutic.  Potassium: K >/= 4: Appropriate to initiate Tikosyn, no replacement needed    Magnesium: Mg 1.8-2: Give Mg 2 gm IV x1 to prevent Mg from dropping below 1.8 -  do not need to recheck Mg. Appropriate to initiate Tikosyn   Thank you for allowing pharmacy to participate in this patient's care   Loralee Pacas, PharmD, BCPS Please see amion for complete clinical pharmacist phone list 09/09/2022  12:36 PM

## 2022-09-09 NOTE — Progress Notes (Signed)
Primary Care Physician: Sheliah Hatch, MD Primary Cardiologist: Dr Jens Som Primary Electrophysiologist: Dr Lalla Brothers Referring Physician: Dr Barbarann Ehlers is a 57 y.o. male with a history of DM, HTN, HLD, OSA, atrial fibrillation who presents for follow up in the [facility]. Patient is on warfarin for a CHADS2VASC score of 3. Patient is s/p afib ablation with Dr Lalla Brothers on 01/02/22. TEE at the time of ablation showed decreased systolic function with EF 40-45%.  Patient was seen at the ED with neck pain and found to be back in rapid afib. Neck pain was felt to be trap spasm. He was discharged from the ED in afib. Patient has converted back to SR before his follow up on 01/30/22 but went out of rhythm again and is s/p DCCV 07/04/22. He has a Nutritional therapist which showed he was back out of rhythm within 24 hours.   On follow up today, patient remains in afib today. He has had 4 therapeutic INR levels, 2.5 today. He discontinued HCTZ last Friday. He skipped his dose of Trulicity on Sunday.   Today, he denies symptoms of chest pain, shortness of breath, orthopnea, PND, lower extremity edema, dizziness, presyncope, syncope, snoring, daytime somnolence, bleeding, or neurologic sequela. The patient is tolerating medications without difficulties and is otherwise without complaint today.    Atrial Fibrillation Risk Factors:  he does have symptoms or diagnosis of sleep apnea. he is compliant with CPAP therapy. he does not have a history of rheumatic fever.   he has a BMI of Body mass index is 38.08 kg/m.Marland Kitchen Filed Weights   09/09/22 0922  Weight: 120.4 kg    Family History  Problem Relation Age of Onset   Heart attack Paternal Uncle    Heart attack Maternal Grandmother    Heart disease Mother    Hypertension Mother    Diabetes Mother    Alcohol abuse Father    Cancer Neg Hx    Early death Neg Hx    Hyperlipidemia Neg Hx    Kidney disease Neg  Hx    Stroke Neg Hx      Atrial Fibrillation Management history:  Previous antiarrhythmic drugs: none Previous cardioversions: 2022, 07/04/22 Previous ablations: 01/02/22 CHADS2VASC score: 3 Anticoagulation history: warfarin    Past Medical History:  Diagnosis Date   DM II (diabetes mellitus, type II), controlled    Erectile dysfunction    Hyperlipidemia    Hypertension    Obesity    OSA (obstructive sleep apnea)    Paroxysmal atrial fibrillation    Past Surgical History:  Procedure Laterality Date   ATRIAL FIBRILLATION ABLATION N/A 01/02/2022   Procedure: ATRIAL FIBRILLATION ABLATION;  Surgeon: Lanier Prude, MD;  Location: [facility] INVASIVE CV LAB;  Service: Cardiovascular;  Laterality: N/A;   CARDIAC CATHETERIZATION     CARDIOVERSION N/A 09/10/2020   Procedure: CARDIOVERSION;  Surgeon: Lewayne Bunting, MD;  Location: [facility] ENDOSCOPY;  Service: Cardiovascular;  Laterality: N/A;   CARDIOVERSION N/A 07/04/2022   Procedure: CARDIOVERSION;  Surgeon: Jodelle Red, MD;  Location: [facility] ENDOSCOPY;  Service: Cardiovascular;  Laterality: N/A;   FINGER SURGERY     left 1st finger   TEE WITHOUT CARDIOVERSION  01/02/2022   Procedure: TRANSESOPHAGEAL ECHOCARDIOGRAM (TEE);  Surgeon: Lanier Prude, MD;  Location: [facility] INVASIVE CV LAB;  Service: Cardiovascular;;    Current Outpatient Medications  Medication Sig Dispense Refill   benazepril (LOTENSIN) 20 MG tablet TAKE 1 TABLET BY MOUTH  DAILY 90 tablet 3   cetirizine (ZYRTEC) 10 MG tablet Take 10 mg by mouth daily.     diltiazem (CARDIZEM CD) 240 MG 24 hr capsule Take 1 capsule (240 mg total) by mouth daily. 90 capsule 1   diltiazem (CARDIZEM) 30 MG tablet TAKE 1 TABLET BY MOUTH EVERY 4 HOURS AS NEEDED FOR AFIB HEART RATE GREATER THAN 100 AS LONG AS TOP BP GREATER THAN 100 30 tablet 1   fluticasone (FLONASE) 50 MCG/ACT nasal spray Place 2 sprays into both nostrils daily. (Patient taking differently: Place 2 sprays into both nostrils as  needed.) 16 g 5   metFORMIN (GLUCOPHAGE-XR) 500 MG 24 hr tablet TAKE 2 TABLETS BY MOUTH  BEFORE BREAKFAST AND 1  TABLET BY MOUTH WITH SUPPER 270 tablet 3   metoprolol succinate (TOPROL-XL) 100 MG 24 hr tablet TAKE 1 TABLET BY MOUTH  DAILY WITH OR IMMEDIATELY  FOLLOWING A MEAL 90 tablet 3   Multiple Vitamin (MULTIVITAMIN) tablet Take 1 tablet by mouth daily.     Omega-3 Fatty Acids (FISH OIL) 1200 MG CAPS Take 2 capsules (2,400 mg total) by mouth 2 (two) times daily.     pravastatin (PRAVACHOL) 40 MG tablet TAKE 1 TABLET BY MOUTH DAILY 90 tablet 3   sildenafil (VIAGRA) 100 MG tablet TAKE 1 TABLET BY MOUTH ONCE DAILY AS NEEDED FOR ERECTILE DYSFUNCTION 10 tablet 0   TRULICITY 0.75 MG/0.5ML SOPN INJECT 0.75 MG SUBCUTANEOUSLY  ONCE A WEEK 4 mL 0   warfarin (COUMADIN) 10 MG tablet TAKE 1/2 TO 1 TABLET BY MOUTH  DAILY AS DIRECTED BY COUMADIN  CLINIC 90 tablet 3   No current facility-administered medications for this encounter.    No Known Allergies  Social History   Socioeconomic History   Marital status: Married    Spouse name: Not on file   Number of children: Not on file   Years of education: Not on file   Highest education level: Not on file  Occupational History   Not on file  Tobacco Use   Smoking status: Former    Types: Cigarettes    Quit date: 05/26/1994    Years since quitting: 28.3   Smokeless tobacco: Never   Tobacco comments:    Former smoker  quit in 1995 01/30/22  Vaping Use   Vaping Use: Never used  Substance and Sexual Activity   Alcohol use: Not Currently    Alcohol/week: 5.0 standard drinks of alcohol    Types: 5 Cans of beer per week   Drug use: No   Sexual activity: Yes  Other Topics Concern   Not on file  Social History Narrative   Not on file   Social Determinants of Health   Financial Resource Strain: Not on file  Food Insecurity: Not on file  Transportation Needs: Not on file  Physical Activity: Not on file  Stress: Not on file  Social Connections:  Not on file  Intimate Partner Violence: Not on file     ROS- All systems are reviewed and negative except as per the HPI above.  Physical Exam: Vitals:   09/09/22 0922  BP: (!) 126/92  Pulse: (!) 113  Weight: 120.4 kg  Height:  (1.778 m)     GEN- The patient is a well appearing male, alert and oriented x 3 today.   HEENT-head normocephalic, atraumatic, sclera clear, conjunctiva pink, hearing intact, trachea midline. Lungs- Clear to ausculation bilaterally, normal work of breathing Heart- irregular rate and rhythm, no murmurs, rubs  or gallops  GI- soft, NT, ND, + BS Extremities- no clubbing, cyanosis, or edema MS- no significant deformity or atrophy Skin- no rash or lesion Psych- euthymic mood, full affect Neuro- strength and sensation are intact   Wt Readings from Last 3 Encounters:  09/09/22 120.4 kg  07/07/22 118.6 kg  07/04/22 117.9 kg    EKG today demonstrates  Afib Vent. rate 113 BPM PR interval * ms QRS duration 88 ms QT/QTcB 324/444 ms  Echo 04/08/22 demonstrated   1. Left ventricular ejection fraction, by estimation, is 60 to 65%. The  left ventricle has normal function. The left ventricle has no regional  wall motion abnormalities. There is mild concentric left ventricular  hypertrophy. Left ventricular diastolic  parameters were normal.   2. Right ventricular systolic function is normal. The right ventricular  size is normal. There is normal pulmonary artery systolic pressure.   3. Left atrial size was moderately dilated.   4. No evidence of mitral valve regurgitation.   5. Aortic valve regurgitation is not visualized.   6. The inferior vena cava is normal in size with greater than 50%  respiratory variability, suggesting right atrial pressure of 3 mmHg.   Comparison(s): No significant change from prior study.   Epic records are reviewed at length today  CHA2DS2-VASc Score = 3  The patient's score is based upon: CHF History: 1 HTN  History: 1 Diabetes History: 1 Stroke History: 0 Vascular Disease History: 0 Age Score: 0 Gender Score: 0       ASSESSMENT AND PLAN: 1. Persistent Atrial Fibrillation (ICD10:  I48.19) The patient's CHA2DS2-VASc score is 3, indicating a 3.2% annual risk of stroke.   S/p afib ablation 01/20/22 S/p DCCV 07/04/22 with quick return of afib. Patient presents for dofetilide admission. Continue warfarin, has had 4 therapeutic INR readings. No recent benadryl use PharmD has screened medications, patient discontinued HCTZ 3 days ago. QTc in SR 451 ms Labs today show creatinine at 0.98, K+ 4.2 and mag 1.8, CrCl calculated at 138 mL/min Continue diltiazem 240 mg daily Continue Toprol 100 mg daily  2. Secondary Hypercoagulable State (ICD10:  D68.69) The patient is at significant risk for stroke/thromboembolism based upon his CHA2DS2-VASc Score of 3.  Continue Warfarin (Coumadin).   3. Obesity Body mass index is 38.08 kg/m. Lifestyle modification was discussed and encouraged including regular physical activity and weight reduction.  4. Obstructive sleep apnea Encouraged compliance with CPAP therapy.  5. HTN Stable, no changes today.  6. Systolic dysfunction EF 40-45% on TEE at time of ablation. Repeat echo showed EF 60-65% Appears euvolemic today.   To be admitted later today once a bed becomes available.    Jorja Loa PA-C Af[facility] [address] [place], [place] [address] [phone] 09/09/2022 10:01 AM

## 2022-09-10 ENCOUNTER — Ambulatory Visit: Payer: 59 | Admitting: Family Medicine

## 2022-09-10 LAB — PROTIME-INR
INR: 2.8 — ABNORMAL HIGH (ref 0.8–1.2)
Prothrombin Time: 29 seconds — ABNORMAL HIGH (ref 11.4–15.2)

## 2022-09-10 LAB — BASIC METABOLIC PANEL
Anion gap: 6 (ref 5–15)
BUN: 20 mg/dL (ref 6–20)
CO2: 23 mmol/L (ref 22–32)
Calcium: 8.8 mg/dL — ABNORMAL LOW (ref 8.9–10.3)
Chloride: 108 mmol/L (ref 98–111)
Creatinine, Ser: 0.97 mg/dL (ref 0.61–1.24)
GFR, Estimated: >60 mL/min (ref >60)
Glucose, Bld: 138 mg/dL — ABNORMAL HIGH (ref 70–99)
Potassium: 4.2 mmol/L (ref 3.5–5.1)
Sodium: 137 mmol/L (ref 135–145)

## 2022-09-10 LAB — MAGNESIUM: Magnesium: 2.1 mg/dL (ref 1.7–2.4)

## 2022-09-10 MED ORDER — WARFARIN SODIUM 5 MG PO TABS
10.0000 mg | ORAL_TABLET | Freq: Once | ORAL | Status: AC
  Administered 2022-09-10: 10 mg via ORAL
  Filled 2022-09-10: qty 2

## 2022-09-10 NOTE — Progress Notes (Signed)
Patient converted to SR Post dose EKG QT difficult in AF SR EKG/QTc is OK Reviewed with Dr. Lalla Brothers Continue Tikosyn  Francis Dowse, PA-C

## 2022-09-10 NOTE — Progress Notes (Addendum)
Rounding Note    Patient Name: Daniel Grant Date of Encounter: 09/10/2022  [facility] Cardiologist: Olga Millers, MD   Subjective   Doing wel, no complaints this morning  Inpatient Medications    Scheduled Meds:  benazepril  20 mg Oral Daily   diltiazem  240 mg Oral Daily   dofetilide  500 mcg Oral BID   loratadine  10 mg Oral Daily   metFORMIN  500 mg Oral BID WC   metoprolol succinate  100 mg Oral Daily   pravastatin  40 mg Oral Daily   sodium chloride flush  3 mL Intravenous Q12H   Warfarin - Pharmacist Dosing Inpatient   Does not apply q1600   Continuous Infusions:  sodium chloride     PRN Meds: sodium chloride, fluticasone, sodium chloride flush   Vital Signs    Vitals:   09/09/22 1202 09/09/22 1630 09/09/22 2004 09/10/22 0359  BP: 108/86 (!) 133/94 (!) 143/94 (!) 130/97  Pulse: 63 82 86 88  Resp: 20 20    Temp: 98.6 F (37 C) 98 F (36.7 C) 97.7 F (36.5 C) 98 F (36.7 C)  TempSrc: Oral Oral Oral Oral  SpO2: 100% 100% 100% 100%  Weight:      Height:        Intake/Output Summary (Last 24 hours) at 09/10/2022 0823 Last data filed at 09/09/2022 2020 Gross per 24 hour  Intake 290.01 ml  Output --  Net 290.01 ml      09/09/2022   10:28 AM 09/09/2022    9:22 AM 07/07/2022    3:11 PM  Last 3 Weights  Weight (lbs) 264 lb 14.4 oz 265 lb 6.4 oz 261 lb 6.4 oz  Weight (kg) 120.158 kg 120.385 kg 118.57 kg      Telemetry    AFib, variable rates, 80's-90's mostly, some faster - Personally Reviewed  ECG    AFib 84bpm, QTc - Personally Reviewed  Physical Exam   GEN: No acute distress.   Neck: No JVD Cardiac: irreg-irreg, no murmurs, rubs, or gallops.  Respiratory: CTA b/l. GI: Soft, nontender, non-distended  MS: No edema; No deformity. Neuro:  Nonfocal  Psych: Normal affect   Labs    High Sensitivity Troponin:  No results for input(s): "TROPONINIHS" in the last 720 hours.   Chemistry Recent Labs  Lab 09/09/22 0939  09/10/22 0216  NA 135 137  K 4.2 4.2  CL 106 108  CO2 20* 23  GLUCOSE 196* 138*  BUN 22* 20  CREATININE 0.98 0.97  CALCIUM 8.9 8.8*  MG 1.8 2.1  GFRNONAA >60 >60  ANIONGAP 9 6    Lipids No results for input(s): "CHOL", "TRIG", "HDL", "LABVLDL", "LDLCALC", "CHOLHDL" in the last 168 hours.  HematologyNo results for input(s): "WBC", "RBC", "HGB", "HCT", "MCV", "MCH", "MCHC", "RDW", "PLT" in the last 168 hours. Thyroid No results for input(s): "TSH", "FREET4" in the last 168 hours.  BNPNo results for input(s): "BNP", "PROBNP" in the last 168 hours.  DDimer No results for input(s): "DDIMER" in the last 168 hours.   Radiology    No results found.  Cardiac Studies   04/08/22: TTE 1. Left ventricular ejection fraction, by estimation, is 60 to 65%. The  left ventricle has normal function. The left ventricle has no regional  wall motion abnormalities. There is mild concentric left ventricular  hypertrophy. Left ventricular diastolic  parameters were normal.   2. Right ventricular systolic function is normal. The right ventricular  size  is normal. There is normal pulmonary artery systolic pressure.   3. Left atrial size was moderately dilated.   4. No evidence of mitral valve regurgitation.   5. Aortic valve regurgitation is not visualized.   6. The inferior vena cava is normal in size with greater than 50%  respiratory variability, suggesting right atrial pressure of 3 mmHg.   Comparison(s): No significant change from prior study.  01/02/22: EPS/ablation  CONCLUSIONS: 1. Successful PVI 2. Successful ablation/isolation of the posterior wall 3. Intracardiac echo reveals trivial pericardial effusion and normal left atrial architecture. 4. Preprocedural TEE showed no left atrial appendage thrombus. 5. No early apparent complications. 6. Colchicine 0.6mg  PO BID x 5 days 7. Protonix  PO daily x 45 days 8. Therapeutic Lovenox as bridge to therapeutic INR with repeat INR check  Monday.  Patient Profile     57 y.o. male w/PMHx of HTN, HLD, OSA, DM, CM, and Afib admitted for Tikosyn  Assessment & Plan    Persistent AFib CHA2DS2Vasc is 3, on warfarin Tikosyn load is in progress K+ 4.2 Mag 2.4 Creat 0.97 INR 2.8 QTc stable  DCCV tomorrow if not in SR, pt aware and agreeable   HTN Home meds  CM Recovered LVEF  OSA CPAP  DM Home meds    For questions or updates, please contact [facility] Please consult [email or web address] for contact info under        Signed, Sheilah Pigeon, PA-C  09/10/2022, 8:23 AM

## 2022-09-10 NOTE — Progress Notes (Signed)
Pt converted to NSR at 1113. PA Francis Dowse notified via secure chat. EKG done and in the chart.

## 2022-09-10 NOTE — Plan of Care (Signed)

## 2022-09-10 NOTE — Progress Notes (Signed)
ANTICOAGULATION CONSULT NOTE  Pharmacy Consult for warfarin Indication: atrial fibrillation  No Known Allergies  Patient Measurements: Height:  (177.8 cm) Weight: 120.2 kg (264 lb 14.4 oz) IBW/kg (Calculated) : 73  Vital Signs: Temp: 98 F (36.7 C) (04/17 0359) Temp Source: Oral (04/17 0359) BP: 132/90 (04/17 0827) Pulse Rate: 111 (04/17 0827)  Labs: Recent Labs    09/09/22 0000 09/09/22 0939 09/10/22 0216  LABPROT  --   --  29.0*  INR 2.5  --  2.8*  CREATININE  --  0.98 0.97     Estimated Creatinine Clearance: 106.6 mL/min (by C-G formula based on SCr of 0.97 mg/dL).   Medical History: Past Medical History:  Diagnosis Date   DM II (diabetes mellitus, type II), controlled    Erectile dysfunction    Hyperlipidemia    Hypertension    Obesity    OSA (obstructive sleep apnea)    Paroxysmal atrial fibrillation       Assessment: 35 yoM with hx AF on warfarin PTA admitted for tikosyn initiation.  Home warfarin dose is  daily except  Sun/Fri, last dose 4/15. -INR= 2.8   Goal of Therapy:  INR 2-3 Monitor platelets by anticoagulation protocol: Yes   Plan:  Warfarin  PO x1 tonight Daily INR  Harland German, PharmD Clinical Pharmacist **Pharmacist phone directory can now be found on [email or web address] ([email or web address]).  Listed under [facility].

## 2022-09-10 NOTE — Progress Notes (Signed)
Pharmacy: Dofetilide (Tikosyn) - Follow Up Assessment and Electrolyte Replacement  Pharmacy consulted to assist in monitoring and replacing electrolytes in this 57 y.o. male admitted on 09/09/2022 undergoing dofetilide initiation.   Labs:    Component Value Date/Time   K 4.2 09/10/2022 0216   MG 2.1 09/10/2022 0216     Plan: Potassium: K >/= 4: No additional supplementation needed  Magnesium: Mg > 2: No additional supplementation needed   Thank you for allowing pharmacy to participate in this patient's care   Harland German, PharmD Clinical Pharmacist **Pharmacist phone directory can now be found on [email or web address] ([email or web address]).  Listed under [facility].

## 2022-09-10 NOTE — Care Management (Signed)
09-10-22 Case Manager spoke with the patient regarding co pay cost. Patient is agreeable to cost and would like to have the initial Rx filled via [facility] and the Rx refills escribed to [facility] 90 day supply. No further needs identified at this time.

## 2022-09-11 ENCOUNTER — Encounter (HOSPITAL_COMMUNITY)
Admission: AD | Disposition: A | Discharge status: Home / Self Care | Payer: Self-pay | Source: Ambulatory Visit | Attending: Physician Assistant

## 2022-09-11 ENCOUNTER — Inpatient Hospital Stay (HOSPITAL_COMMUNITY): Admission: RE | Admit: 2022-09-11 | Payer: 59 | Source: Home / Self Care | Admitting: Cardiology

## 2022-09-11 LAB — PROTIME-INR
INR: 2.8 — ABNORMAL HIGH (ref 0.8–1.2)
Prothrombin Time: 29.6 seconds — ABNORMAL HIGH (ref 11.4–15.2)

## 2022-09-11 LAB — BASIC METABOLIC PANEL
Anion gap: 9 (ref 5–15)
BUN: 20 mg/dL (ref 6–20)
CO2: 23 mmol/L (ref 22–32)
Calcium: 8.9 mg/dL (ref 8.9–10.3)
Chloride: 105 mmol/L (ref 98–111)
Creatinine, Ser: 0.93 mg/dL (ref 0.61–1.24)
GFR, Estimated: >60 mL/min (ref >60)
Glucose, Bld: 135 mg/dL — ABNORMAL HIGH (ref 70–99)
Potassium: 4.1 mmol/L (ref 3.5–5.1)
Sodium: 137 mmol/L (ref 135–145)

## 2022-09-11 LAB — MAGNESIUM: Magnesium: 1.7 mg/dL (ref 1.7–2.4)

## 2022-09-11 SURGERY — CARDIOVERSION
Anesthesia: Monitor Anesthesia Care

## 2022-09-11 MED ORDER — MAGNESIUM SULFATE 4 GM/100ML IV SOLN
4.0000 g | Freq: Once | INTRAVENOUS | Status: AC
  Administered 2022-09-11: 4 g via INTRAVENOUS
  Filled 2022-09-11: qty 100

## 2022-09-11 MED ORDER — WARFARIN SODIUM 5 MG PO TABS
10.0000 mg | ORAL_TABLET | Freq: Once | ORAL | Status: AC
  Administered 2022-09-11: 10 mg via ORAL
  Filled 2022-09-11: qty 2

## 2022-09-11 NOTE — Progress Notes (Signed)
Rounding Note    Patient Name: Daniel Grant Date of Encounter: 09/10/2022  [facility] Cardiologist: Olga Millers, MD   Subjective   Doing well, no complaints this morning, happy to be in SR  Inpatient Medications    Scheduled Meds:  benazepril  20 mg Oral Daily   diltiazem  240 mg Oral Daily   dofetilide  500 mcg Oral BID   loratadine  10 mg Oral Daily   metFORMIN  500 mg Oral BID WC   metoprolol succinate  100 mg Oral Daily   pravastatin  40 mg Oral Daily   sodium chloride flush  3 mL Intravenous Q12H   Warfarin - Pharmacist Dosing Inpatient   Does not apply q1600   Continuous Infusions:  sodium chloride     PRN Meds: sodium chloride, fluticasone, sodium chloride flush   Vital Signs    Vitals:   09/09/22 1202 09/09/22 1630 09/09/22 2004 09/10/22 0359  BP: 108/86 (!) 133/94 (!) 143/94 (!) 130/97  Pulse: 63 82 86 88  Resp: 20 20    Temp: 98.6 F (37 C) 98 F (36.7 C) 97.7 F (36.5 C) 98 F (36.7 C)  TempSrc: Oral Oral Oral Oral  SpO2: 100% 100% 100% 100%  Weight:      Height:        Intake/Output Summary (Last 24 hours) at 09/10/2022 0823 Last data filed at 09/09/2022 2020 Gross per 24 hour  Intake 290.01 ml  Output --  Net 290.01 ml      09/09/2022   10:28 AM 09/09/2022    9:22 AM 07/07/2022    3:11 PM  Last 3 Weights  Weight (lbs) 264 lb 14.4 oz 265 lb 6.4 oz 261 lb 6.4 oz  Weight (kg) 120.158 kg 120.385 kg 118.57 kg      Telemetry    AFib >> SR  60's- Personally Reviewed  ECG    SR 66bpm, QTc - Personally Reviewed  Physical Exam    GEN: No acute distress.   Neck: No JVD Cardiac: RRR, no murmurs, rubs, or gallops.  Respiratory: CTA b/l. GI: Soft, nontender, non-distended  MS: No edema; No deformity. Neuro:  Nonfocal  Psych: Normal affect   Labs    High Sensitivity Troponin:  No results for input(s): "TROPONINIHS" in the last 720 hours.   Chemistry Recent Labs  Lab 09/09/22 0939 09/10/22 0216  NA 135 137   K 4.2 4.2  CL 106 108  CO2 20* 23  GLUCOSE 196* 138*  BUN 22* 20  CREATININE 0.98 0.97  CALCIUM 8.9 8.8*  MG 1.8 2.1  GFRNONAA >60 >60  ANIONGAP 9 6    Lipids No results for input(s): "CHOL", "TRIG", "HDL", "LABVLDL", "LDLCALC", "CHOLHDL" in the last 168 hours.  HematologyNo results for input(s): "WBC", "RBC", "HGB", "HCT", "MCV", "MCH", "MCHC", "RDW", "PLT" in the last 168 hours. Thyroid No results for input(s): "TSH", "FREET4" in the last 168 hours.  BNPNo results for input(s): "BNP", "PROBNP" in the last 168 hours.  DDimer No results for input(s): "DDIMER" in the last 168 hours.   Radiology    No results found.  Cardiac Studies   04/08/22: TTE 1. Left ventricular ejection fraction, by estimation, is 60 to 65%. The  left ventricle has normal function. The left ventricle has no regional  wall motion abnormalities. There is mild concentric left ventricular  hypertrophy. Left ventricular diastolic  parameters were normal.   2. Right ventricular systolic function is normal. The right  ventricular  size is normal. There is normal pulmonary artery systolic pressure.   3. Left atrial size was moderately dilated.   4. No evidence of mitral valve regurgitation.   5. Aortic valve regurgitation is not visualized.   6. The inferior vena cava is normal in size with greater than 50%  respiratory variability, suggesting right atrial pressure of 3 mmHg.   Comparison(s): No significant change from prior study.  01/02/22: EPS/ablation  CONCLUSIONS: 1. Successful PVI 2. Successful ablation/isolation of the posterior wall 3. Intracardiac echo reveals trivial pericardial effusion and normal left atrial architecture. 4. Preprocedural TEE showed no left atrial appendage thrombus. 5. No early apparent complications. 6. Colchicine 0.6mg  PO BID x 5 days 7. Protonix  PO daily x 45 days 8. Therapeutic Lovenox as bridge to therapeutic INR with repeat INR check Monday.  Patient Profile      57 y.o. male w/PMHx of HTN, HLD, OSA, DM, CM, and Afib admitted for Tikosyn  Assessment & Plan    Persistent AFib CHA2DS2Vasc is 3, on warfarin Tikosyn load is in progress K+ 4.1 Mag 1.7 (replacement with RPH) Creat 0.93, stable INR 2.8 QTc stable  Converted with drug Anticipate discharge tomorrow   HTN Home meds  CM Recovered LVEF Compensated  OSA CPAP  DM Home meds    For questions or updates, please contact [facility] Please consult [email or web address] for contact info under        Signed, Sheilah Pigeon, PA-C  09/10/2022, 8:23 AM

## 2022-09-11 NOTE — Progress Notes (Signed)
Pharmacy: Dofetilide (Tikosyn) - Follow Up Assessment and Electrolyte Replacement  Pharmacy consulted to assist in monitoring and replacing electrolytes in this 57 y.o. male admitted on 09/09/2022 undergoing dofetilide initiation.   Labs:    Component Value Date/Time   K 4.1 09/11/2022 0207   MG 1.7 09/11/2022 0207     Plan: Potassium: K >/= 4: No additional supplementation needed  Magnesium: Mg 1.3-1.7: Give Mg 4 gm IV x1  I anticipate no need for a potassium supplement at discharge  Thank you for allowing pharmacy to participate in this patient's care   Harland German, PharmD Clinical Pharmacist **Pharmacist phone directory can now be found on [email or web address] ([email or web address]).  Listed under [facility].

## 2022-09-11 NOTE — Progress Notes (Signed)
Post dose EKG reviewed Stable QTc Continue tikosyn   Samyra Limb, PA-C 

## 2022-09-11 NOTE — Progress Notes (Signed)
ANTICOAGULATION CONSULT NOTE  Pharmacy Consult for warfarin Indication: atrial fibrillation  No Known Allergies  Patient Measurements: Height:  (177.8 cm) Weight: 120.2 kg (264 lb 14.4 oz) IBW/kg (Calculated) : 73  Vital Signs: Temp: 98 F (36.7 C) (04/18 0814) Temp Source: Oral (04/18 0814) BP: 127/76 (04/18 0817) Pulse Rate: 71 (04/18 0817)  Labs: Recent Labs    09/09/22 0000 09/09/22 0939 09/10/22 0216 09/11/22 0207  LABPROT  --   --  29.0* 29.6*  INR 2.5  --  2.8* 2.8*  CREATININE  --  0.98 0.97 0.93     Estimated Creatinine Clearance: 111.2 mL/min (by C-G formula based on SCr of 0.93 mg/dL).   Medical History: Past Medical History:  Diagnosis Date   DM II (diabetes mellitus, type II), controlled    Erectile dysfunction    Hyperlipidemia    Hypertension    Obesity    OSA (obstructive sleep apnea)    Paroxysmal atrial fibrillation       Assessment: 5 yoM with hx AF on warfarin PTA admitted for tikosyn initiation.  Home warfarin dose is  daily except  Sun/Fri, last dose 4/15. -INR= 2.8   Goal of Therapy:  INR 2-3 Monitor platelets by anticoagulation protocol: Yes   Plan:  Warfarin  PO x1 tonight Daily INR  Harland German, PharmD Clinical Pharmacist **Pharmacist phone directory can now be found on [email or web address] ([email or web address]).  Listed under [facility].

## 2022-09-12 ENCOUNTER — Other Ambulatory Visit (HOSPITAL_COMMUNITY): Payer: Self-pay

## 2022-09-12 LAB — BASIC METABOLIC PANEL
Anion gap: 5 (ref 5–15)
BUN: 21 mg/dL — ABNORMAL HIGH (ref 6–20)
CO2: 23 mmol/L (ref 22–32)
Calcium: 8.2 mg/dL — ABNORMAL LOW (ref 8.9–10.3)
Chloride: 108 mmol/L (ref 98–111)
Creatinine, Ser: 0.98 mg/dL (ref 0.61–1.24)
GFR, Estimated: >60 mL/min (ref >60)
Glucose, Bld: 142 mg/dL — ABNORMAL HIGH (ref 70–99)
Potassium: 4.2 mmol/L (ref 3.5–5.1)
Sodium: 136 mmol/L (ref 135–145)

## 2022-09-12 LAB — MAGNESIUM: Magnesium: 2 mg/dL (ref 1.7–2.4)

## 2022-09-12 LAB — PROTIME-INR
INR: 2.9 — ABNORMAL HIGH (ref 0.8–1.2)
Prothrombin Time: 30.1 seconds — ABNORMAL HIGH (ref 11.4–15.2)

## 2022-09-12 MED ORDER — MAGNESIUM SULFATE 2 GM/50ML IV SOLN
2.0000 g | Freq: Once | INTRAVENOUS | Status: AC
  Administered 2022-09-12: 2 g via INTRAVENOUS
  Filled 2022-09-12: qty 50

## 2022-09-12 MED ORDER — MAGNESIUM OXIDE 400 MG PO TABS
400.0000 mg | ORAL_TABLET | Freq: Every day | ORAL | 5 refills | Status: DC
  Filled 2022-09-12: qty 30, 30d supply, fill #0

## 2022-09-12 MED ORDER — DOFETILIDE 500 MCG PO CAPS
500.0000 ug | ORAL_CAPSULE | Freq: Two times a day (BID) | ORAL | 5 refills | Status: DC
  Filled 2022-09-12: qty 60, 30d supply, fill #0

## 2022-09-12 NOTE — Progress Notes (Signed)
Pharmacy: Dofetilide (Tikosyn) - Follow Up Assessment and Electrolyte Replacement  Pharmacy consulted to assist in monitoring and replacing electrolytes in this 57 y.o. male admitted on 09/09/2022 undergoing dofetilide initiation.   Labs:    Component Value Date/Time   K 4.2 09/12/2022 0242   MG 2.0 09/12/2022 0242     Plan: Potassium: K >/= 4: No additional supplementation needed  Magnesium: Mg 1.8-2: Give Mg 2 gm IV x1    No potassium given this admission. Recommend no potassium supplementation needed at discharge  Thank you for allowing pharmacy to participate in this patient's care   Harland German, PharmD Clinical Pharmacist **Pharmacist phone directory can now be found on [email or web address] ([email or web address]).  Listed under [facility].

## 2022-09-12 NOTE — Progress Notes (Signed)
ANTICOAGULATION CONSULT NOTE  Pharmacy Consult for warfarin Indication: atrial fibrillation  No Known Allergies  Patient Measurements: Height:  (177.8 cm) Weight: 120.2 kg (264 lb 14.4 oz) IBW/kg (Calculated) : 73  Vital Signs: Temp: 97.9 F (36.6 C) (04/19 0644) Temp Source: Oral (04/19 0644) BP: 152/86 (04/19 0644) Pulse Rate: 63 (04/19 0644)  Labs: Recent Labs    09/10/22 0216 09/11/22 0207 09/12/22 0242  LABPROT 29.0* 29.6* 30.1*  INR 2.8* 2.8* 2.9*  CREATININE 0.97 0.93 0.98     Estimated Creatinine Clearance: 105.5 mL/min (by C-G formula based on SCr of 0.98 mg/dL).   Medical History: Past Medical History:  Diagnosis Date   DM II (diabetes mellitus, type II), controlled    Erectile dysfunction    Hyperlipidemia    Hypertension    Obesity    OSA (obstructive sleep apnea)    Paroxysmal atrial fibrillation       Assessment: 24 yoM with hx AF on warfarin PTA admitted for tikosyn initiation.  Home warfarin dose is  daily except  Sun/Fri, last dose 4/15. -INR= 2.8  Plans noted for discharge  Goal of Therapy:  INR 2-3 Monitor platelets by anticoagulation protocol: Yes   Plan:  -At discharge, would continue  daily except  Sun/Fri -Consider INR late next week  Harland German, PharmD Clinical Pharmacist **Pharmacist phone directory can now be found on [email or web address] ([email or web address]).  Listed under [facility].

## 2022-09-12 NOTE — Discharge Summary (Signed)
ELECTROPHYSIOLOGY PROCEDURE DISCHARGE SUMMARY    Patient ID: Daniel Grant,  MRN: [id number], DOB/AGE: 59-Jun-1967 59 y.o.  Admit date: 09/09/2022 Discharge date: 09/12/2022  Primary Care Physician: Sheliah Hatch, MD  Primary Cardiologist: Dr. Jens Som Electrophysiologist: Dr. Lalla Brothers  Primary Discharge Diagnosis:  1.  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  Tikosyn loading   Brief HPI: [name] is a 57 y.o. male with a past medical history as noted above.  They were referred to EP in the outpatient setting for treatment options of atrial fibrillation.  Risks, benefits, and alternatives to Tikosyn were reviewed with the patient who wished to proceed.    Hospital Course:  The patient was admitted and Tikosyn was initiated.  Renal function and electrolytes were followed during the hospitalization.  The patient's QTc remained stable.  He converted with drug and did not need DCCV  He was monitored until discharge on telemetry which demonstrated AFib > SR.  On the day of discharge, he feels well, was examined by Dr Lalla Brothers who considered the patient stable for discharge to home.  Follow-up has been arranged with the AFib clinic in 1 week and EP service in 4 weeks.   Tikosyn teaching was completed electrolyte replacement for home will be Mag ox  daily     Physical Exam: Vitals:   09/11/22 1437 09/12/22 0644 09/12/22 0849 09/12/22 1033  BP: 135/77 (!) 152/86 (!) 150/82 137/80  Pulse: 75 63  68  Resp: Temp: 97.9 F (36.6 C) 97.9 F (36.6 C)  97.9 F (36.6 C)  TempSrc: Oral Oral  Oral  SpO2: 96%   99%  Weight:      Height:        GEN- The patient is well appearing, alert and oriented x 3 today.   HEENT:  normocephalic, atraumatic; sclera clear, conjunctiva pink; hearing intact; oropharynx clear; neck supple, no JVP Lymph- no cervical lymphadenopathy Lungs- CTA b/l, normal work of breathing.  No wheezes, rales, rhonchi Heart- RRR, no murmurs, rubs or gallops, PMI not laterally displaced GI- soft, non-tender, non-distended Extremities- no clubbing, cyanosis, or edema MS- no significant deformity or atrophy Skin- warm and dry, no rash or lesion Psych- euthymic mood, full affect Neuro- strength and sensation are intact   Labs:   Lab Results  Component Value Date   WBC 12.8 (H) 02/03/2022   HGB 16.0 07/04/2022   HCT 47.0 07/04/2022   MCV 84 02/03/2022   PLT 286 02/03/2022    Recent Labs  Lab 09/12/22 0242  NA 136  K 4.2  CL 108  CO2 23  BUN 21*  CREATININE 0.98  CALCIUM 8.2*  GLUCOSE 142*     Discharge Medications:  Allergies as of 09/12/2022   No Known Allergies      Medication List     TAKE these medications    benazepril 20 MG tablet Commonly known as: LOTENSIN TAKE 1 TABLET BY MOUTH DAILY   cetirizine 10 MG tablet Commonly known as: ZYRTEC Take 10 mg by mouth daily.   diltiazem 240 MG 24 hr capsule Commonly known as: CARDIZEM CD Take 1 capsule (240 mg total) by mouth daily.   diltiazem 30 MG tablet Commonly known as:  CARDIZEM TAKE 1 TABLET BY MOUTH EVERY 4 HOURS AS NEEDED FOR AFIB HEART RATE GREATER THAN 100 AS LONG AS TOP BP GREATER THAN 100 What changed:  how much to take how to take this when to take this additional instructions   dofetilide 500 MCG capsule Commonly known as: TIKOSYN Take 1 capsule (500 mcg total) by mouth 2 (two) times daily.   FISH OIL PO Take 2 capsules by mouth 2 (two) times daily.   fluticasone 50 MCG/ACT nasal spray Commonly known as: FLONASE Place 2 sprays into both nostrils daily. What changed:  when to take this reasons to take this   Magnesium Oxide 400 MG Caps Take 1 capsule (400 mg total) by mouth  daily.   metFORMIN 500 MG 24 hr tablet Commonly known as: GLUCOPHAGE-XR TAKE 2 TABLETS BY MOUTH  BEFORE BREAKFAST AND 1  TABLET BY MOUTH WITH SUPPER What changed:  how much to take how to take this when to take this additional instructions   metoprolol succinate 100 MG 24 hr tablet Commonly known as: TOPROL-XL TAKE 1 TABLET BY MOUTH  DAILY WITH OR IMMEDIATELY  FOLLOWING A MEAL What changed: See the new instructions.   multivitamin tablet Take 1 tablet by mouth daily.   pravastatin 40 MG tablet Commonly known as: PRAVACHOL TAKE 1 TABLET BY MOUTH DAILY What changed: when to take this   PROBIOTIC PO Take 1 capsule by mouth daily.   sildenafil 100 MG tablet Commonly known as: VIAGRA TAKE 1 TABLET BY MOUTH ONCE DAILY AS NEEDED FOR ERECTILE DYSFUNCTION   Trulicity 0.75 MG/0.5ML Sopn Generic drug: Dulaglutide INJECT 0.75 MG SUBCUTANEOUSLY  ONCE A WEEK   warfarin 10 MG tablet Commonly known as: COUMADIN Take as directed. If you are unsure how to take this medication, talk to your nurse or doctor. Original instructions: TAKE 1/2 TO 1 TABLET BY MOUTH  DAILY AS DIRECTED BY COUMADIN  CLINIC What changed:  how much to take how to take this when to take this additional instructions        Disposition: home Discharge Instructions     Diet - low sodium heart healthy   Complete by: As directed    Increase activity slowly   Complete by: As directed         Duration of Discharge Encounter: Greater than 30 minutes including physician time.  Norma Fredrickson, PA-C 09/12/2022 1:30 PM

## 2022-09-16 ENCOUNTER — Ambulatory Visit (INDEPENDENT_AMBULATORY_CARE_PROVIDER_SITE_OTHER): Payer: 59 | Admitting: *Deleted

## 2022-09-16 ENCOUNTER — Telehealth: Payer: Self-pay | Admitting: *Deleted

## 2022-09-16 ENCOUNTER — Telehealth: Payer: Self-pay

## 2022-09-16 DIAGNOSIS — Z5181 Encounter for therapeutic drug level monitoring: Secondary | ICD-10-CM

## 2022-09-16 DIAGNOSIS — I480 Paroxysmal atrial fibrillation: Secondary | ICD-10-CM | POA: Diagnosis not present

## 2022-09-16 LAB — POCT INR: INR: 1.6 — AB (ref 2.0–3.0)

## 2022-09-16 NOTE — Transitions of Care (Post Inpatient/ED Visit) (Signed)
   09/16/2022  Name: Daniel Grant MRN: [id number] DOB: 06-Jan-1966  Today's TOC FU Call Status: Today's TOC FU Call Status:: Successful TOC FU Call Competed TOC FU Call Complete Date: 09/16/22  Transition Care Management Follow-up Telephone Call Date of Discharge: 09/12/22 Discharge [facility]) Type of Discharge: Inpatient Admission Primary Inpatient Discharge Diagnosis:: Persistent Atrial Fibrillation How have you been since you were released from the hospital?: Better Any questions or concerns?: No  Items Reviewed: Did you receive and understand the discharge instructions provided?: Yes Medications obtained and verified?: Yes (Medications Reviewed) Any new allergies since your discharge?: No Dietary orders reviewed?: NA Do you have support at home?: Yes People in Home: spouse  Home Care and Equipment/Supplies: Were Home Health Services Ordered?: No Any new equipment or medical supplies ordered?: No  Functional Questionnaire: Do you need assistance with bathing/showering or dressing?: No Do you need assistance with meal preparation?: No Do you need assistance with eating?: No Do you have difficulty maintaining continence: No Do you need assistance with getting out of bed/getting out of a chair/moving?: No Do you have difficulty managing or taking your medications?: No  Follow up appointments reviewed: PCP Follow-up appointment confirmed?: Yes Date of PCP follow-up appointment?: 09/24/22 Follow-up Provider: Dr. Beverely Low Specialist [facility] Follow-up appointment confirmed?: Yes Date of Specialist follow-up appointment?: 09/22/22 Follow-Up Specialty Provider:: [facility] Do you need transportation to your follow-up appointment?: No Do you understand care options if your condition(s) worsen?: Yes-patient verbalized understanding    SIGNATURE Kandis Fantasia, LPN [facility] Health Advisor [place] [facility] You Are. We Are. [employer] #  [phone]

## 2022-09-16 NOTE — Patient Instructions (Signed)
Description   Spoke with pt's wife, advised to have pt to take 1.5 tablets of warfarin today then continue taking Warfarin 1 tablet daily except for 1/2 tablet on Sundays and Fridays. Recheck INR in 1 week. [facility] [phone] *Please discuss DOAC option with spouse during next INR check - note on file to discuss after insurance change*  Holding off for now.

## 2022-09-16 NOTE — Telephone Encounter (Signed)
Called pt's wife to see if pt INR has been done and she stated she would complete it once he gets home this afternoon. Will await.   Also, she states the tikosyn worked on the 2nd day and the pt feels better.

## 2022-09-20 ENCOUNTER — Other Ambulatory Visit: Payer: Self-pay | Admitting: Family Medicine

## 2022-09-22 ENCOUNTER — Ambulatory Visit (HOSPITAL_COMMUNITY)
Admit: 2022-09-22 | Discharge: 2022-09-22 | Disposition: A | Discharge status: Home / Self Care | Payer: 59 | Attending: Physician Assistant | Admitting: Physician Assistant

## 2022-09-22 ENCOUNTER — Telehealth: Payer: Self-pay | Admitting: Cardiology

## 2022-09-22 VITALS — BP 170/84 | HR 67 | Ht 70.0 in | Wt 269.0 lb

## 2022-09-22 DIAGNOSIS — E785 Hyperlipidemia, unspecified: Secondary | ICD-10-CM | POA: Insufficient documentation

## 2022-09-22 DIAGNOSIS — I480 Paroxysmal atrial fibrillation: Secondary | ICD-10-CM | POA: Diagnosis not present

## 2022-09-22 DIAGNOSIS — I10 Essential (primary) hypertension: Secondary | ICD-10-CM | POA: Insufficient documentation

## 2022-09-22 DIAGNOSIS — G4733 Obstructive sleep apnea (adult) (pediatric): Secondary | ICD-10-CM | POA: Diagnosis not present

## 2022-09-22 DIAGNOSIS — E669 Obesity, unspecified: Secondary | ICD-10-CM | POA: Diagnosis not present

## 2022-09-22 DIAGNOSIS — Z7901 Long term (current) use of anticoagulants: Secondary | ICD-10-CM | POA: Insufficient documentation

## 2022-09-22 DIAGNOSIS — D6869 Other thrombophilia: Secondary | ICD-10-CM | POA: Diagnosis not present

## 2022-09-22 DIAGNOSIS — Z6838 Body mass index (BMI) 38.0-38.9, adult: Secondary | ICD-10-CM | POA: Diagnosis not present

## 2022-09-22 DIAGNOSIS — I4819 Other persistent atrial fibrillation: Secondary | ICD-10-CM | POA: Insufficient documentation

## 2022-09-22 DIAGNOSIS — Z5181 Encounter for therapeutic drug level monitoring: Secondary | ICD-10-CM

## 2022-09-22 DIAGNOSIS — Z79899 Other long term (current) drug therapy: Secondary | ICD-10-CM

## 2022-09-22 DIAGNOSIS — E119 Type 2 diabetes mellitus without complications: Secondary | ICD-10-CM | POA: Insufficient documentation

## 2022-09-22 LAB — BASIC METABOLIC PANEL
Anion gap: 8 (ref 5–15)
BUN: 18 mg/dL (ref 6–20)
CO2: 23 mmol/L (ref 22–32)
Calcium: 9 mg/dL (ref 8.9–10.3)
Chloride: 107 mmol/L (ref 98–111)
Creatinine, Ser: 0.94 mg/dL (ref 0.61–1.24)
GFR, Estimated: >60 mL/min (ref >60)
Glucose, Bld: 152 mg/dL — ABNORMAL HIGH (ref 70–99)
Potassium: 3.8 mmol/L (ref 3.5–5.1)
Sodium: 138 mmol/L (ref 135–145)

## 2022-09-22 LAB — MAGNESIUM: Magnesium: 1.8 mg/dL (ref 1.7–2.4)

## 2022-09-22 MED ORDER — BENAZEPRIL HCL 40 MG PO TABS
40.0000 mg | ORAL_TABLET | Freq: Every day | ORAL | 1 refills | Status: DC
Start: 2022-09-22 — End: 2023-04-20

## 2022-09-22 MED ORDER — MAGNESIUM OXIDE 400 MG PO TABS: 400.0000 mg | ORAL_TABLET | Freq: Every day | ORAL | 1 refills | Status: DC

## 2022-09-22 MED ORDER — DOFETILIDE 500 MCG PO CAPS: 500.0000 ug | ORAL_CAPSULE | Freq: Two times a day (BID) | ORAL | 1 refills | Status: DC

## 2022-09-22 NOTE — Progress Notes (Signed)
Primary Care Physician: Sheliah Hatch, MD Primary Cardiologist: Dr Jens Som Primary Electrophysiologist: Dr Lalla Brothers Referring Physician: Dr Barbarann Ehlers is a 57 y.o. male with a history of DM, HTN, HLD, OSA, atrial fibrillation who presents for follow up in the [facility]. Patient is on warfarin for a CHADS2VASC score of 3. Patient is s/p afib ablation with Dr Lalla Brothers on 01/02/22. TEE at the time of ablation showed decreased systolic function with EF 40-45%.  Patient was seen at the ED with neck pain and found to be back in rapid afib. Neck pain was felt to be trap spasm. He was discharged from the ED in afib. Patient has converted back to SR before his follow up on 01/30/22 but went out of rhythm again and is s/p DCCV 07/04/22. He has a Nutritional therapist which showed he was back out of rhythm within 24 hours.   On follow up today, patient is s/p dofetilide loading 4/16-4/19/24. He converted with the medication and did not require DCCV. Patient reports that he has had afib almost every day since leaving the hospital. Most episodes appear to be 1-2 hours, some longer. He is asymptomatic but rapid during those episodes. He is in SR today. He has also noticed increased BP readings since stopping HCTZ.   Today, he denies symptoms of palpitations, chest pain, shortness of breath, orthopnea, PND, lower extremity edema, dizziness, presyncope, syncope, snoring, daytime somnolence, bleeding, or neurologic sequela. The patient is tolerating medications without difficulties and is otherwise without complaint today.    Atrial Fibrillation Risk Factors:  he does have symptoms or diagnosis of sleep apnea. he is compliant with CPAP therapy. he does not have a history of rheumatic fever.   he has a BMI of Body mass index is 38.6 kg/m.Marland Kitchen Filed Weights   09/22/22 1451  Weight: 122 kg    Family History  Problem Relation Age of Onset   Heart attack Paternal  Uncle    Heart attack Maternal Grandmother    Heart disease Mother    Hypertension Mother    Diabetes Mother    Alcohol abuse Father    Cancer Neg Hx    Early death Neg Hx    Hyperlipidemia Neg Hx    Kidney disease Neg Hx    Stroke Neg Hx      Atrial Fibrillation Management history:  Previous antiarrhythmic drugs: dofetilide  Previous cardioversions: 2022, 07/04/22 Previous ablations: 01/02/22 CHADS2VASC score: 3 Anticoagulation history: warfarin    Past Medical History:  Diagnosis Date   DM II (diabetes mellitus, type II), controlled (HCC)    Erectile dysfunction    Hyperlipidemia    Hypertension    Obesity    OSA (obstructive sleep apnea)    Paroxysmal atrial fibrillation (HCC)    Past Surgical History:  Procedure Laterality Date   ATRIAL FIBRILLATION ABLATION N/A 01/02/2022   Procedure: ATRIAL FIBRILLATION ABLATION;  Surgeon: Lanier Prude, MD;  Location: [facility] INVASIVE CV LAB;  Service: Cardiovascular;  Laterality: N/A;   CARDIAC CATHETERIZATION     CARDIOVERSION N/A 09/10/2020   Procedure: CARDIOVERSION;  Surgeon: Lewayne Bunting, MD;  Location: [facility] ENDOSCOPY;  Service: Cardiovascular;  Laterality: N/A;   CARDIOVERSION N/A 07/04/2022   Procedure: CARDIOVERSION;  Surgeon: Jodelle Red, MD;  Location: [facility] ENDOSCOPY;  Service: Cardiovascular;  Laterality: N/A;   FINGER SURGERY     left 1st finger   TEE WITHOUT CARDIOVERSION  01/02/2022   Procedure: TRANSESOPHAGEAL ECHOCARDIOGRAM (  TEE);  Surgeon: Lanier Prude, MD;  Location: [facility] INVASIVE CV LAB;  Service: Cardiovascular;;    Current Outpatient Medications  Medication Sig Dispense Refill   cetirizine (ZYRTEC) 10 MG tablet Take 10 mg by mouth daily.     diltiazem (CARDIZEM CD) 240 MG 24 hr capsule Take 1 capsule (240 mg total) by mouth daily. 90 capsule 1   diltiazem (CARDIZEM) 30 MG tablet TAKE 1 TABLET BY MOUTH EVERY 4 HOURS AS NEEDED FOR AFIB HEART RATE GREATER THAN 100 AS LONG AS TOP BP GREATER THAN 100  (Patient taking differently: Take 30 mg by mouth See admin instructions. 30 mg every 4 hours as needed for AFIB - HR > 100 as long as sBP > 100.) 30 tablet 1   fluticasone (FLONASE) 50 MCG/ACT nasal spray Place 2 sprays into both nostrils daily. (Patient taking differently: Place 2 sprays into both nostrils daily as needed for allergies.) 16 g 5   metFORMIN (GLUCOPHAGE-XR) 500 MG 24 hr tablet TAKE 2 TABLETS BY MOUTH  BEFORE BREAKFAST AND 1  TABLET BY MOUTH WITH SUPPER (Patient taking differently: Take 500-1,000 mg by mouth See admin instructions. 1000 mg in the morning before breakfast, 500 mg with supper.) 270 tablet 3   metoprolol succinate (TOPROL-XL) 100 MG 24 hr tablet TAKE 1 TABLET BY MOUTH  DAILY WITH OR IMMEDIATELY  FOLLOWING A MEAL (Patient taking differently: Take 100 mg by mouth daily with breakfast.) 90 tablet 3   Multiple Vitamin (MULTIVITAMIN) tablet Take 1 tablet by mouth daily.     Omega-3 Fatty Acids (FISH OIL PO) Take 2 capsules by mouth 2 (two) times daily.     pravastatin (PRAVACHOL) 40 MG tablet TAKE 1 TABLET BY MOUTH DAILY (Patient taking differently: Take 40 mg by mouth every evening.) 90 tablet 3   Probiotic Product (PROBIOTIC PO) Take 1 capsule by mouth daily.     sildenafil (VIAGRA) 100 MG tablet TAKE 1 TABLET BY MOUTH ONCE DAILY AS NEEDED FOR ERECTILE DYSFUNCTION 10 tablet 0   TRULICITY 0.75 MG/0.5ML SOPN INJECT 0.75 MG SUBCUTANEOUSLY ONCE A WEEK 4 mL 0   warfarin (COUMADIN) 10 MG tablet TAKE 1/2 TO 1 TABLET BY MOUTH  DAILY AS DIRECTED BY COUMADIN  CLINIC (Patient taking differently: Take 5-10 mg by mouth See admin instructions. 5 mg once daily in the evenings on Sunday, Friday 10 mg on every other day.) 90 tablet 3   benazepril (LOTENSIN) 40 MG tablet Take 1 tablet (40 mg total) by mouth daily. 90 tablet 1   dofetilide (TIKOSYN) 500 MCG capsule Take 1 capsule (500 mcg total) by mouth 2 (two) times daily. 180 capsule 1   magnesium oxide (MAG-OX) 400 MG tablet Take 1 tablet  (400 mg total) by mouth daily. 90 tablet 1   No current facility-administered medications for this encounter.    No Known Allergies  Social History   Socioeconomic History   Marital status: Married    Spouse name: Not on file   Number of children: Not on file   Years of education: Not on file   Highest education level: Not on file  Occupational History   Not on file  Tobacco Use   Smoking status: Former    Types: Cigarettes    Quit date: 05/26/1994    Years since quitting: 28.3   Smokeless tobacco: Never   Tobacco comments:    Former smoker  quit in 1995 01/30/22  Vaping Use   Vaping Use: Never used  Substance and Sexual  Activity   Alcohol use: Not Currently    Alcohol/week: 5.0 standard drinks of alcohol    Types: 5 Cans of beer per week   Drug use: No   Sexual activity: Yes  Other Topics Concern   Not on file  Social History Narrative   Not on file   Social Determinants of Health   Financial Resource Strain: Not on file  Food Insecurity: No Food Insecurity (09/11/2022)   Hunger Vital Sign    Worried About Running Out of Food in the Last Year: Never true    Ran Out of Food in the Last Year: Never true  Transportation Needs: No Transportation Needs (09/11/2022)   PRAPARE - Administrator, Civil Service (Medical): No    Lack of Transportation (Non-Medical): No  Physical Activity: Not on file  Stress: Not on file  Social Connections: Not on file  Intimate Partner Violence: Not At Risk (09/11/2022)   Humiliation, Afraid, Rape, and Kick questionnaire    Fear of Current or Ex-Partner: No    Emotionally Abused: No    Physically Abused: No    Sexually Abused: No     ROS- All systems are reviewed and negative except as per the HPI above.  Physical Exam: Vitals:   09/22/22 1451  BP: (!) 170/84  Pulse: 67  Weight: 122 kg  Height: 5\' 10"  (1.778 m)    GEN- The patient is a well appearing male, alert and oriented x 3 today.   HEENT-head normocephalic,  atraumatic, sclera clear, conjunctiva pink, hearing intact, trachea midline. Lungs- Clear to ausculation bilaterally, normal work of breathing Heart- Regular rate and rhythm, no murmurs, rubs or gallops  GI- soft, NT, ND, + BS Extremities- no clubbing, cyanosis, or edema MS- no significant deformity or atrophy Skin- no rash or lesion Psych- euthymic mood, full affect Neuro- strength and sensation are intact   Wt Readings from Last 3 Encounters:  09/22/22 122 kg  09/09/22 120.2 kg  09/09/22 120.4 kg    EKG today demonstrates  SR Vent. rate 67 BPM PR interval 160 ms QRS duration 88 ms QT/QTcB 424/448 ms  Echo 04/08/22 demonstrated   1. Left ventricular ejection fraction, by estimation, is 60 to 65%. The  left ventricle has normal function. The left ventricle has no regional  wall motion abnormalities. There is mild concentric left ventricular  hypertrophy. Left ventricular diastolic  parameters were normal.   2. Right ventricular systolic function is normal. The right ventricular  size is normal. There is normal pulmonary artery systolic pressure.   3. Left atrial size was moderately dilated.   4. No evidence of mitral valve regurgitation.   5. Aortic valve regurgitation is not visualized.   6. The inferior vena cava is normal in size with greater than 50%  respiratory variability, suggesting right atrial pressure of 3 mmHg.   Comparison(s): No significant change from prior study.   Epic records are reviewed at length today  CHA2DS2-VASc Score = 3  The patient's score is based upon: CHF History: 1 HTN History: 1 Diabetes History: 1 Stroke History: 0 Vascular Disease History: 0 Age Score: 0 Gender Score: 0       ASSESSMENT AND PLAN: 1. Persistent Atrial Fibrillation (ICD10:  I48.19) The patient's CHA2DS2-VASc score is 3, indicating a 3.2% annual risk of stroke.   S/p afib ablation 01/20/22 S/p dofetilide admission 4/16-4/19/24 Patient in SR but having frequent  afib episodes. We discussed rhythm control options today. Will see how he  does over the next month and if he continues to have frequent afib, he is willing to consider repeat ablation.  Continue dofetilide 500 mcg BID. QT stable. Check bmet/mag today.  Continue warfarin Continue diltiazem 240 mg daily Continue Toprol 100 mg daily  2. Secondary Hypercoagulable State (ICD10:  D68.69) The patient is at significant risk for stroke/thromboembolism based upon his CHA2DS2-VASc Score of 3.  Continue Warfarin (Coumadin).   3. Obesity Body mass index is 38.6 kg/m. Lifestyle modification was discussed and encouraged including regular physical activity and weight reduction.  4. Obstructive sleep apnea Encouraged compliance with CPAP therapy.  5. HTN Elevated today and at home. Will increase benazepril to 40 mg daily.  Bmet at follow up visit.   6. Systolic dysfunction EF 40-45% on TEE at time of ablation. Repeat echo showed EF 60-65% Fluid status appears stable today.   Follow up Yevette Edwards as scheduled. AF clinic in 4 months.    Jorja Loa PA-C Afib [facility] [address] [place], [place] [address] [phone] 09/22/2022 4:36 PM

## 2022-09-22 NOTE — Patient Instructions (Addendum)
Increase benazepril 40mg  once a day

## 2022-09-22 NOTE — Telephone Encounter (Signed)
Patient is calling wanting to know why he is scheduled to see the coumadin clinic when he checks his own coumadin at home. His wife is a nurse who sends it to our office so he has never had to come in prior. He is requesting a VM be left on if the appt is cancelled if he does not answer due to having an appt at 3 pm with the Afib clinic. Please advise.

## 2022-09-22 NOTE — Telephone Encounter (Signed)
Returned call to pt, LM on VM as requested advised I cancelled Coumadin Clinic follow-up appt.  Appt was made at recent hospital discharge they did not realize pt was a self-tester and tests weekly at home.  Advised to continue to keep his home testing regimen of weekly checks, next one due on 09/23/22.  Call back with further questions.

## 2022-09-23 ENCOUNTER — Ambulatory Visit (INDEPENDENT_AMBULATORY_CARE_PROVIDER_SITE_OTHER): Payer: 59 | Admitting: *Deleted

## 2022-09-23 DIAGNOSIS — Z5181 Encounter for therapeutic drug level monitoring: Secondary | ICD-10-CM

## 2022-09-23 DIAGNOSIS — I480 Paroxysmal atrial fibrillation: Secondary | ICD-10-CM | POA: Diagnosis not present

## 2022-09-23 LAB — POCT INR: INR: 2.1 (ref 2.0–3.0)

## 2022-09-23 NOTE — Patient Instructions (Signed)
Description   Call wife at [phone]; Spoke with pt's wife on DPR, advised to have pt to continue taking Warfarin 1 tablet daily except for 1/2 tablet on Sundays and Fridays. Recheck INR in 1 week (normally 2 weeks). Coumadin Clinic [phone] *Please discuss DOAC option with spouse during next INR check - note on file to discuss after insurance change*  Holding off for now.

## 2022-09-24 ENCOUNTER — Ambulatory Visit: Payer: 59 | Admitting: Family Medicine

## 2022-09-24 ENCOUNTER — Encounter: Payer: Self-pay | Admitting: Cardiology

## 2022-09-25 ENCOUNTER — Ambulatory Visit: Payer: 59

## 2022-09-29 ENCOUNTER — Other Ambulatory Visit (HOSPITAL_COMMUNITY): Payer: Self-pay

## 2022-09-29 ENCOUNTER — Ambulatory Visit: Payer: 59 | Admitting: Family Medicine

## 2022-09-30 ENCOUNTER — Encounter: Payer: Self-pay | Admitting: Cardiology

## 2022-09-30 ENCOUNTER — Ambulatory Visit (INDEPENDENT_AMBULATORY_CARE_PROVIDER_SITE_OTHER): Payer: 59

## 2022-09-30 ENCOUNTER — Other Ambulatory Visit (HOSPITAL_COMMUNITY): Payer: Self-pay

## 2022-09-30 DIAGNOSIS — I480 Paroxysmal atrial fibrillation: Secondary | ICD-10-CM | POA: Diagnosis not present

## 2022-09-30 DIAGNOSIS — Z5181 Encounter for therapeutic drug level monitoring: Secondary | ICD-10-CM

## 2022-09-30 LAB — POCT INR: INR: 2 (ref 2.0–3.0)

## 2022-09-30 MED ORDER — MAGNESIUM OXIDE 400 MG PO TABS: 400.0000 mg | ORAL_TABLET | Freq: Every day | ORAL | 1 refills | Status: DC

## 2022-09-30 NOTE — Patient Instructions (Signed)
Description   Call wife at [phone]; Spoke with pt's wife on DPR, advised to have pt to take 1.5 tablets today and then continue taking Warfarin 1 tablet daily except for 1/2 tablet on Sundays and Fridays.  Recheck INR in 2 weeks.   Coumadin Clinic [phone] *Please discuss DOAC option with spouse during next INR check - note on file to discuss after insurance change*  Holding off for now.

## 2022-10-07 ENCOUNTER — Ambulatory Visit: Payer: 59 | Admitting: Cardiology

## 2022-10-08 ENCOUNTER — Encounter: Payer: Self-pay | Admitting: Family Medicine

## 2022-10-08 ENCOUNTER — Ambulatory Visit: Payer: 59 | Admitting: Family Medicine

## 2022-10-08 ENCOUNTER — Other Ambulatory Visit: Payer: Self-pay | Admitting: Family Medicine

## 2022-10-08 VITALS — BP 130/84 | HR 57 | Temp 98.4°F | Resp 19 | Ht 70.0 in | Wt 268.5 lb

## 2022-10-08 DIAGNOSIS — Z7984 Long term (current) use of oral hypoglycemic drugs: Secondary | ICD-10-CM

## 2022-10-08 DIAGNOSIS — N522 Drug-induced erectile dysfunction: Secondary | ICD-10-CM

## 2022-10-08 DIAGNOSIS — E119 Type 2 diabetes mellitus without complications: Secondary | ICD-10-CM

## 2022-10-08 DIAGNOSIS — E1169 Type 2 diabetes mellitus with other specified complication: Secondary | ICD-10-CM

## 2022-10-08 DIAGNOSIS — E785 Hyperlipidemia, unspecified: Secondary | ICD-10-CM

## 2022-10-08 DIAGNOSIS — I10 Essential (primary) hypertension: Secondary | ICD-10-CM

## 2022-10-08 LAB — LIPID PANEL
Cholesterol: 118 mg/dL (ref 0–200)
HDL: 35.7 mg/dL — ABNORMAL LOW (ref >39.00)
LDL Cholesterol: 61 mg/dL (ref 0–99)
NonHDL: 81.94
Total CHOL/HDL Ratio: 3
Triglycerides: 106 mg/dL (ref 0.0–149.0)
VLDL: 21.2 mg/dL (ref 0.0–40.0)

## 2022-10-08 LAB — HEPATIC FUNCTION PANEL
ALT: 25 U/L (ref 0–53)
AST: 22 U/L (ref 0–37)
Albumin: 4.1 g/dL (ref 3.5–5.2)
Alkaline Phosphatase: 86 U/L (ref 39–117)
Bilirubin, Direct: 0.1 mg/dL (ref 0.0–0.3)
Total Bilirubin: 0.6 mg/dL (ref 0.2–1.2)
Total Protein: 6.9 g/dL (ref 6.0–8.3)

## 2022-10-08 LAB — CBC WITH DIFFERENTIAL/PLATELET
Basophils Absolute: 0.1 10*3/uL (ref 0.0–0.1)
Basophils Relative: 0.6 % (ref 0.0–3.0)
Eosinophils Absolute: 0.3 10*3/uL (ref 0.0–0.7)
Eosinophils Relative: 3.3 % (ref 0.0–5.0)
HCT: 44 % (ref 39.0–52.0)
Hemoglobin: 15 g/dL (ref 13.0–17.0)
Lymphocytes Relative: 27 % (ref 12.0–46.0)
Lymphs Abs: 2.6 10*3/uL (ref 0.7–4.0)
MCHC: 34.1 g/dL (ref 30.0–36.0)
MCV: 84.2 fl (ref 78.0–100.0)
Monocytes Absolute: 0.7 10*3/uL (ref 0.1–1.0)
Monocytes Relative: 7.6 % (ref 3.0–12.0)
Neutro Abs: 5.8 10*3/uL (ref 1.4–7.7)
Neutrophils Relative %: 61.5 % (ref 43.0–77.0)
Platelets: 226 10*3/uL (ref 150.0–400.0)
RBC: 5.22 Mil/uL (ref 4.22–5.81)
RDW: 14.6 % (ref 11.5–15.5)
WBC: 9.5 10*3/uL (ref 4.0–10.5)

## 2022-10-08 LAB — BASIC METABOLIC PANEL
BUN: 19 mg/dL (ref 6–23)
CO2: 26 mEq/L (ref 19–32)
Calcium: 9.4 mg/dL (ref 8.4–10.5)
Chloride: 105 mEq/L (ref 96–112)
Creatinine, Ser: 0.87 mg/dL (ref 0.40–1.50)
GFR: 94.7 mL/min (ref >60.00)
Glucose, Bld: 90 mg/dL (ref 70–99)
Potassium: 4.3 mEq/L (ref 3.5–5.1)
Sodium: 138 mEq/L (ref 135–145)

## 2022-10-08 LAB — HEMOGLOBIN A1C: Hgb A1c MFr Bld: 6.7 % — ABNORMAL HIGH (ref 4.6–6.5)

## 2022-10-08 LAB — TSH: TSH: 0.77 u[IU]/mL (ref 0.35–5.50)

## 2022-10-08 MED ORDER — TRULICITY 0.75 MG/0.5ML ~~LOC~~ SOAJ
SUBCUTANEOUS | 1 refills | Status: DC
Start: 2022-10-08 — End: 2022-11-11

## 2022-10-08 NOTE — Assessment & Plan Note (Signed)
Deteriorated.  Pt has gained 8 lbs since Feb.  Encouraged low carb diet and regular exercise.  Will follow.

## 2022-10-08 NOTE — Assessment & Plan Note (Signed)
Chronic problem.  On Pravastatin 40mg daily w/o difficulty.  Check labs.  Adjust meds prn  

## 2022-10-08 NOTE — Assessment & Plan Note (Signed)
Chronic problem.  On Metoprolol XL 100mg  daily, Diltiazem 240mg  daily, Benazepril 40mg  daily w/ adequate control.  Currently asymptomatic.  Check labs due to ACE but no anticipated med changes.  Will follow.

## 2022-10-08 NOTE — Patient Instructions (Signed)
Schedule your complete physical in 4 months We'll notify you of your lab results and make any changes if needed Continue to work on low carb/low sugar diet and regular exercise- you can do it! Call with any questions or concerns Stay Safe!  Stay Healthy! Have a great summer!!

## 2022-10-08 NOTE — Progress Notes (Signed)
   Subjective:    Patient ID: Daniel Grant, male    DOB: 21-Jan-1966, 57 y.o.   MRN: [id number]  HPI DM- chronic problem, on Metformin XR 1000mg  qAM and 500mg  qPM and Trulicity 0.75mg  weekly.  No numbness/tingling of hands/feet.  HTN- chronic problem, on Metoprolol XL 100mg  daily, Diltiazem 240mg  daily, Benazepril 40mg  daily w/ adequate control.  No CP, SOB, HA's, visual changes, edema.  Hyperlipidemia- chronic problem, on Pravastatin 40mg  daily.  No abd pain, N/V.  Obesity- pt has gained 8 lbs since Feb.  Pt has sleep apnea evaluation upcoming.   Review of Systems For ROS see HPI     Objective:   Physical Exam Vitals reviewed.  Constitutional:      General: He is not in acute distress.    Appearance: Normal appearance. He is well-developed. He is obese.  HENT:     Head: Normocephalic and atraumatic.  Eyes:     Extraocular Movements: Extraocular movements intact.     Conjunctiva/sclera: Conjunctivae normal.     Pupils: Pupils are equal, round, and reactive to light.  Neck:     Thyroid: No thyromegaly.  Cardiovascular:     Rate and Rhythm: Normal rate. Rhythm irregular.     Pulses: Normal pulses.     Heart sounds: Normal heart sounds. No murmur heard. Pulmonary:     Effort: Pulmonary effort is normal. No respiratory distress.     Breath sounds: Normal breath sounds.  Abdominal:     General: Bowel sounds are normal. There is no distension.     Palpations: Abdomen is soft.  Musculoskeletal:     Cervical back: Normal range of motion and neck supple.     Right lower leg: No edema.     Left lower leg: No edema.  Lymphadenopathy:     Cervical: No cervical adenopathy.  Skin:    General: Skin is warm and dry.  Neurological:     General: No focal deficit present.     Mental Status: He is alert and oriented to person, place, and time.     Cranial Nerves: No cranial nerve deficit.  Psychiatric:        Mood and Affect: Mood normal.        Behavior: Behavior normal.            Assessment & Plan:

## 2022-10-08 NOTE — Assessment & Plan Note (Signed)
Chronic problem.  Currently on Metformin XR 1000mg  qAM and 500mg  qPM and Trulicity 0.75mg  weekly.  Last A1C 8.1%  Encouraged low carb diet and regular physical activity.  UTD on eye exam, foot exam, microalbumin.  Check labs.  Adjust meds prn

## 2022-10-09 ENCOUNTER — Telehealth: Payer: Self-pay

## 2022-10-09 NOTE — Telephone Encounter (Signed)
Pt is aware of lab results.

## 2022-10-09 NOTE — Telephone Encounter (Signed)
-----   Message from Sheliah Hatch, MD sent at 10/09/2022  7:36 AM EDT ----- Labs look great!  A1C is MUCH better!  Keep up the good work!

## 2022-10-10 ENCOUNTER — Ambulatory Visit: Payer: 59 | Attending: Cardiovascular Disease | Admitting: Cardiovascular Disease

## 2022-10-10 ENCOUNTER — Ambulatory Visit: Payer: 59 | Admitting: Nurse Practitioner

## 2022-10-10 VITALS — BP 118/82 | HR 57 | Ht 67.0 in | Wt 268.0 lb

## 2022-10-10 VITALS — BP 118/82 | HR 57 | Ht 67.0 in | Wt 268.2 lb

## 2022-10-10 DIAGNOSIS — E119 Type 2 diabetes mellitus without complications: Secondary | ICD-10-CM

## 2022-10-10 DIAGNOSIS — E782 Mixed hyperlipidemia: Secondary | ICD-10-CM

## 2022-10-10 DIAGNOSIS — D6869 Other thrombophilia: Secondary | ICD-10-CM

## 2022-10-10 DIAGNOSIS — G4733 Obstructive sleep apnea (adult) (pediatric): Secondary | ICD-10-CM

## 2022-10-10 DIAGNOSIS — Z7984 Long term (current) use of oral hypoglycemic drugs: Secondary | ICD-10-CM

## 2022-10-10 DIAGNOSIS — I480 Paroxysmal atrial fibrillation: Secondary | ICD-10-CM | POA: Diagnosis not present

## 2022-10-10 DIAGNOSIS — I10 Essential (primary) hypertension: Secondary | ICD-10-CM

## 2022-10-10 DIAGNOSIS — I4819 Other persistent atrial fibrillation: Secondary | ICD-10-CM

## 2022-10-10 DIAGNOSIS — E1169 Type 2 diabetes mellitus with other specified complication: Secondary | ICD-10-CM

## 2022-10-10 DIAGNOSIS — I519 Heart disease, unspecified: Secondary | ICD-10-CM | POA: Diagnosis not present

## 2022-10-10 DIAGNOSIS — E785 Hyperlipidemia, unspecified: Secondary | ICD-10-CM

## 2022-10-10 NOTE — Progress Notes (Addendum)
Cardiology Office Note    Date:  10/17/2022   ID:  Daniel Grant, DOB 1965/07/23, MRN [id number]  PCP:  Sheliah Hatch, MD  Cardiologist:  Nicki Guadalajara, MD (sleep); Dr. Olga Millers EP: Dr. Steffanie Dunn  New sleep evaluation and consultation   History of Present Illness:  Daniel Grant is a 57 y.o. male who is followed by Dr. Olga Millers for cardiology care and Dr. Lalla Brothers for electrophysiology.  Daniel Grant has a history of hypertension, diabetes mellitus, hyperlipidemia, obstructive sleep apnea, as well as atrial fibrillation.  He is status post atrial fibrillation ablation with Dr. Lalla Brothers on January 02, 2022.  EF at that time was 40 to 45%.  He has had several episodes recurrent atrial fibrillation leading to subsequent cardioversion.  He has been on warfarin for anticoagulation.  Due to recurrent AF, he was started on dofetilide with hospital loading on April 16 through September 12, 2022.  He ultimately again converted to sinus rhythm with dofetilide institution.  He was seen in follow-up in atrial fibrillation clinic by Alphonzo Severance, PA on September 22, 2022.  During that evaluation, he stated that he was experiencing episodes of atrial fibrillation almost every day since leaving the hospital with episodes lasting 1 to 2 hours and some longer.  When seen by Jorja Loa, he was in sinus rhythm.  Blood pressures had increased since he had discontinued hydrochlorothiazide.  From a sleep perspective, Daniel Grant admits to a longstanding history of atrial fibrillation and received an initial CPAP device in 2009 which she believes was in S9 Elite.  Remotely, he had been seen by Dr. Riley Kill.  He apparently received a new CPAP device on August 21, 2016.  He has a ResMed air sense 10 AutoSet unit.  With [employer] as his DME company.  His current device is set at a minimum pressure of 5 with maximum pressure at 15 and apparently had never been adjusted.  Presently, he drives a Production designer, theatre/television/film  for [employer].  He works the night shift typically from 1 AM until 11:30 AM and often goes to bed around 6 PM until midnight.  He is in need of a DOT physical with demonstration of compliance  with CPAP therapy.  I obtained a download today from September 09, 2022 until Oct 08, 2022.  CPAP compliance is excellent with 100% use.  He is averaging 5 hours and 48 minutes of CPAP use per night with 100% usage greater than 4 hours.  His pressure is still set at a minimum of 5 maximum of 15 and his 95th percentile pressure is 14.5 with maximum average pressure at 14.9.  AHI is 1.9.  An Epworth Sleepiness Scale score was calculated in the office today and this endorsed at 10 suggesting borderline residual daytime sleepiness.  He now presents for his initial sleep consultation and evaluation.  Presently, he is on benazepril 40 mg, diltiazem 240 mg, metoprolol succinate 100 mg for hypertension.  He is on dofetilide 500 mg twice a day with his PAF.  He is anticoagulated on warfarin.  He is diabetic metformin in addition to progressively.  He is on pravastatin 40 mg for hyperlipidemia.  Past Medical History:  Diagnosis Date   DM II (diabetes mellitus, type II), controlled (HCC)    Erectile dysfunction    Hyperlipidemia    Hypertension    Obesity    OSA (obstructive sleep apnea)    Paroxysmal atrial fibrillation (HCC)  Past Surgical History:  Procedure Laterality Date   ATRIAL FIBRILLATION ABLATION N/A 01/02/2022   Procedure: ATRIAL FIBRILLATION ABLATION;  Surgeon: Lanier Prude, MD;  Location: [facility] INVASIVE CV LAB;  Service: Cardiovascular;  Laterality: N/A;   CARDIAC CATHETERIZATION     CARDIOVERSION N/A 09/10/2020   Procedure: CARDIOVERSION;  Surgeon: Lewayne Bunting, MD;  Location: [facility] ENDOSCOPY;  Service: Cardiovascular;  Laterality: N/A;   CARDIOVERSION N/A 07/04/2022   Procedure: CARDIOVERSION;  Surgeon: Jodelle Red, MD;  Location: [facility] ENDOSCOPY;  Service: Cardiovascular;   Laterality: N/A;   FINGER SURGERY     left 1st finger   TEE WITHOUT CARDIOVERSION  01/02/2022   Procedure: TRANSESOPHAGEAL ECHOCARDIOGRAM (TEE);  Surgeon: Lanier Prude, MD;  Location: [facility] INVASIVE CV LAB;  Service: Cardiovascular;;    Current Medications: Outpatient Medications Prior to Visit  Medication Sig Dispense Refill   benazepril (LOTENSIN) 40 MG tablet Take 1 tablet (40 mg total) by mouth daily. 90 tablet 1   cetirizine (ZYRTEC) 10 MG tablet Take 10 mg by mouth daily.     diltiazem (CARDIZEM CD) 240 MG 24 hr capsule Take 1 capsule (240 mg total) by mouth daily. 90 capsule 1   diltiazem (CARDIZEM) 30 MG tablet TAKE 1 TABLET BY MOUTH EVERY 4 HOURS AS NEEDED FOR AFIB HEART RATE GREATER THAN 100 AS LONG AS TOP BP GREATER THAN 100 (Patient taking differently: Take 30 mg by mouth See admin instructions. 30 mg every 4 hours as needed for AFIB - HR > 100 as long as sBP > 100.) 30 tablet 1   dofetilide (TIKOSYN) 500 MCG capsule Take 1 capsule (500 mcg total) by mouth 2 (two) times daily. 180 capsule 1   Dulaglutide (TRULICITY) 0.75 MG/0.5ML SOPN INJECT 0.75 MG SUBCUTANEOUSLY ONCE A WEEK 6 mL 1   fluticasone (FLONASE) 50 MCG/ACT nasal spray Place 2 sprays into both nostrils daily. (Patient taking differently: Place 2 sprays into both nostrils daily as needed for allergies.) 16 g 5   magnesium oxide (MAG-OX) 400 MG tablet Take 1 tablet (400 mg total) by mouth daily. 90 tablet 1   metFORMIN (GLUCOPHAGE-XR) 500 MG 24 hr tablet TAKE 2 TABLETS BY MOUTH  BEFORE BREAKFAST AND 1  TABLET BY MOUTH WITH SUPPER (Patient taking differently: Take 500-1,000 mg by mouth See admin instructions. 1000 mg in the morning before breakfast, 500 mg with supper.) 270 tablet 3   metoprolol succinate (TOPROL-XL) 100 MG 24 hr tablet TAKE 1 TABLET BY MOUTH  DAILY WITH OR IMMEDIATELY  FOLLOWING A MEAL (Patient taking differently: Take 100 mg by mouth daily with breakfast.) 90 tablet 3   Multiple Vitamin (MULTIVITAMIN)  tablet Take 1 tablet by mouth daily.     Omega-3 Fatty Acids (FISH OIL PO) Take 2 capsules by mouth 2 (two) times daily.     pravastatin (PRAVACHOL) 40 MG tablet TAKE 1 TABLET BY MOUTH DAILY (Patient taking differently: Take 40 mg by mouth every evening.) 90 tablet 3   Probiotic Product (PROBIOTIC PO) Take 1 capsule by mouth daily.     sildenafil (VIAGRA) 100 MG tablet TAKE 1 TABLET BY MOUTH ONCE DAILY AS NEEDED FOR ERECTILE DYSFUNCTION 10 tablet 0   warfarin (COUMADIN) 10 MG tablet TAKE 1/2 TO 1 TABLET BY MOUTH  DAILY AS DIRECTED BY COUMADIN  CLINIC (Patient taking differently: Take 5-10 mg by mouth See admin instructions. 5 mg once daily in the evenings on Sunday, Friday 10 mg on every other day.) 90 tablet 3   No facility-administered  medications prior to visit.     Allergies:   Patient has no known allergies.   Social History   Socioeconomic History   Marital status: Married    Spouse name: Not on file   Number of children: Not on file   Years of education: Not on file   Highest education level: Not on file  Occupational History   Not on file  Tobacco Use   Smoking status: Former    Types: Cigarettes    Quit date: 05/26/1994    Years since quitting: 28.4   Smokeless tobacco: Never   Tobacco comments:    Former smoker  quit in 1995 01/30/22  Vaping Use   Vaping Use: Never used  Substance and Sexual Activity   Alcohol use: Not Currently    Alcohol/week: 5.0 standard drinks of alcohol    Types: 5 Cans of beer per week   Drug use: No   Sexual activity: Yes  Other Topics Concern   Not on file  Social History Narrative   Not on file   Social Determinants of Health   Financial Resource Strain: Not on file  Food Insecurity: No Food Insecurity (09/11/2022)   Hunger Vital Sign    Worried About Running Out of Food in the Last Year: Never true    Ran Out of Food in the Last Year: Never true  Transportation Needs: No Transportation Needs (09/11/2022)   PRAPARE - Therapist, art (Medical): No    Lack of Transportation (Non-Medical): No  Physical Activity: Not on file  Stress: Not on file  Social Connections: Not on file    Social history is notable that he was born in [place].  He is married for 27 years.  He has 5 children, with 3 sons at 53, 32 and 71 and 2 daughters at 56 and 85  Family History:  The patient's family history includes Alcohol abuse in his father; Diabetes in his mother; Heart attack in his maternal grandmother and paternal uncle; Heart disease in his mother; Hypertension in his mother.   Father died at 56 with cirrhosis.  Mother died at age 40 as result of a gunshot.  He does not have any siblings  ROS General: Negative; No fevers, chills, or night sweats;  HEENT: Negative; No changes in vision or hearing, sinus congestion, difficulty swallowing Pulmonary: Negative; No cough, wheezing, shortness of breath, hemoptysis Cardiovascular: Negative; No chest pain, presyncope, syncope, palpitations GI: Negative; No nausea, vomiting, diarrhea, or abdominal pain GU: Negative; No dysuria, hematuria, or difficulty voiding Musculoskeletal: Negative; no myalgias, joint pain, or weakness Hematologic/Oncology: Negative; no easy bruising, bleeding Endocrine: Negative; no heat/cold intolerance; no diabetes Neuro: Negative; no changes in balance, headaches Skin: Negative; No rashes or skin lesions Psychiatric: Negative; No behavioral problems, depression Sleep: See HPI: Documented OSA since 2009 on CPAP therapy.  Prior snoring, frequent awakenings, nocturia, daytime sleepiness.  Epworth Sleepiness Scale: 10/10/2022 Situation   Chance of Dozing/Sleeping (0 = never , 1 = slight chance , 2 = moderate chance , 3 = high chance )   sitting and reading 1   watching TV 3   sitting inactive in a public place 1   being a passenger in a motor vehicle for an hour or more 2   lying down in the afternoon 3   sitting and talking to someone 0    sitting quietly after lunch (no alcohol) 0   while stopped for a few minutes in traffic  as the driver 0   Total Score  10     Other comprehensive 14 point system review is negative.   PHYSICAL EXAM:   VS:  BP 118/82   Pulse (!) 57   Ht 5\' 7"  (1.702 m)   Wt 268 lb (121.6 kg)   SpO2 97%   BMI 41.97 kg/m     Repeat blood pressure by me was 136/80  Wt Readings from Last 3 Encounters:  10/10/22 268 lb (121.6 kg)  10/10/22 268 lb 3.2 oz (121.7 kg)  10/08/22 268 lb 8 oz (121.8 kg)    General: Alert, oriented, no distress.  Skin: normal turgor, no rashes, warm and dry HEENT: Normocephalic, atraumatic. Pupils equal round and reactive to light; sclera anicteric; extraocular muscles intact;  Nose without nasal septal hypertrophy Mouth/Parynx: Large tongue, Mallinpatti scale 4 Neck: No JVD, no carotid bruits; normal carotid upstroke Lungs: clear to ausculatation and percussion; no wheezing or rales Chest wall: without tenderness to palpitation Heart: PMI not displaced, RRR, s1 s2 normal, 1/6 systolic murmur, no diastolic murmur, no rubs, gallops, thrills, or heaves Abdomen: soft, nontender; no hepatosplenomehaly, BS+; abdominal aorta nontender and not dilated by palpation. Back: no CVA tenderness Pulses 2+ Musculoskeletal: full range of motion, normal strength, no joint deformities Extremities: no clubbing cyanosis or edema, Homan's sign negative  Neurologic: grossly nonfocal; Cranial nerves grossly wnl Psychologic: Normal mood and affect   Studies/Labs Reviewed:   Oct 10, 2022 ECG (independently read by me): Sinus bradycardia at 57, nonspecific T changes  Recent Labs:    Latest Ref Rng & Units 10/08/2022    1:29 PM 09/22/2022    2:49 PM 09/12/2022    2:42 AM  BMP  Glucose 70 - 99 mg/dL 90  161  096   BUN 6 - 23 mg/dL 19  18  21    Creatinine 0.40 - 1.50 mg/dL 0.45  4.09  8.11   Sodium 135 - 145 mEq/L 138  138  136   Potassium 3.5 - 5.1 mEq/L 4.3  3.8  4.2   Chloride 96 -  112 mEq/L 105  107  108   CO2 19 - 32 mEq/L 26  23  23    Calcium 8.4 - 10.5 mg/dL 9.4  9.0  8.2         Latest Ref Rng & Units 10/08/2022    1:29 PM 01/22/2022    2:31 PM 06/07/2021    2:26 PM  Hepatic Function  Total Protein 6.0 - 8.3 g/dL 6.9  7.4  6.8   Albumin 3.5 - 5.2 g/dL 4.1  3.9    AST 0 - 37 U/L 22  14  17    ALT 0 - 53 U/L 25  16  24    Alk Phosphatase 39 - 117 U/L 86  108    Total Bilirubin 0.2 - 1.2 mg/dL 0.6  0.6  0.6   Bilirubin, Direct 0.0 - 0.3 mg/dL 0.1  0.1  0.2        Latest Ref Rng & Units 10/08/2022    1:29 PM 07/04/2022   11:06 AM 02/03/2022    3:40 PM  CBC  WBC 4.0 - 10.5 K/uL 9.5   12.8   Hemoglobin 13.0 - 17.0 g/dL 91.4  78.2  95.6   Hematocrit 39.0 - 52.0 % 44.0  47.0  49.8   Platelets 150.0 - 400.0 K/uL 226.0   286    Lab Results  Component Value Date   MCV 84.2 10/08/2022  MCV 84 02/03/2022   MCV 84.3 01/22/2022   Lab Results  Component Value Date   TSH 0.77 10/08/2022   Lab Results  Component Value Date   HGBA1C 6.7 (H) 10/08/2022     BNP No results found for: "BNP"  ProBNP No results found for: "PROBNP"   Lipid Panel     Component Value Date/Time   CHOL 118 10/08/2022 1329   CHOL 128 07/27/2020 1015   TRIG 106.0 10/08/2022 1329   HDL 35.70 (L) 10/08/2022 1329   HDL 33 (L) 07/27/2020 1015   CHOLHDL 3 10/08/2022 1329   VLDL 21.2 10/08/2022 1329   LDLCALC 61 10/08/2022 1329   LDLCALC 64 06/07/2021 1426   LDLDIRECT 80.0 12/15/2018 1359   LABVLDL 25 07/27/2020 1015     RADIOLOGY: No results found.   Additional studies/ records that were reviewed today include:   Records of Dr. Jens Som, Dr. Lalla Brothers, Alphonzo Severance, PA were reviewed.  A new download was obtained from April 16 through Oct 08, 2022.  ASSESSMENT:    1. OSA (obstructive sleep apnea)   2. PAF (paroxysmal atrial fibrillation) (HCC)   3. Hypercoagulable state due to persistent atrial fibrillation (HCC)   4. Essential hypertension   5. Type 2 diabetes  mellitus without complication, without long-term current use of insulin (HCC)   6. Morbid obesity (HCC)   7. Hyperlipidemia associated with type 2 diabetes mellitus [facility])     PLAN:  Daniel Grant is a 57 year old gentleman who has cardiovascular comorbidities including hypertension, hyperlipidemia, diabetes mellitus, atrial fibrillation, status post ablation with subsequent recurrent AF currently on dofetilide therapy.  TEE at the time of his ablation showed mildly reduced systolic function with EF 40 to 45%.  Most recent echo Doppler study in November 2023 showed improvement in LV function with normalization and EF at 60 to 65% without regional wall motion abnormalities.  Patient has a history of sleep apnea dating back to 2009 and apparently initially had an S9 Elite device.  It appears that he received a new device in 2018 which is a ResMed air sense 10 AutoSet unit.  His initial set up pressures were 5 to 15 cm of water and his device is still at this pressure.  [employer] is his DME company.  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  Oftentimes he goes to bed at 6 PM and wakes up at midnight.  I had a lengthy discussion with him today in the office. I discussed normal sleep architecture and the disruptive sleep architecture associated with obstructive sleep apnea.  I reviewed in detail the effect on cardiovascular health if patients have untreated sleep apnea with particular reference to its effects on hypertension, nocturnal arrhythmias with increased sympathetic tone contributing to palpitations as well as increased atrial fibrillation risk.  I discussed its effects on insulin resistance contributing to increased glucose, GERD, as well as potential increased inflammation.  In addition I discussed nocturnal hypoxemia at contributing potentially to nocturnal ischemia both cardiac as well as cerebrovascular.  I reviewed the  pathophysiology associated with frequent nocturia seen in patients with untreated sleep apnea and the benefit of therapy.  Presently, he is compliant with CPAP therapy but I discussed optimal sleep duration at 7 and 9 hours.  I suspect his average use of only 5 hours and 48 minutes are contributing to his borderline residual daytime sleepiness with current Epworth sleepiness scale score calculated at  10.  With his current download showing 95th percentile pressure at 14.5, I am making adjustments to his current CPAP unit and will change his minimal pressure to 10 and his maximum pressure up to 18.  I will also change his ramp start pressure to 6 from currently at 4.  He is meeting compliance standards to fulfill requirements for his DOT physical.  In the office today I also provided him with a new ResMed AirFit N30i sample mask, since he has not had a new mask in several years.  His BMI is elevated at 42.01 consistent with morbid obesity.  I discussed the importance of weight loss and exercise.  Blood pressure today is relatively stable on current therapy when checked by me systolic pressure was 136 and on presentation to the office 118/82.  He is maintaining sinus rhythm on dofetilide.  He continues to be on Trulicity and metformin for diabetes mellitus and pravastatin for hyperlipidemia.  He is anticoagulated on warfarin.  From a sleep perspective, I will see him in 1 year for reevaluation or sooner as needed.   Medication Adjustments/Labs and Tests Ordered: Current medicines are reviewed at length with the patient today.  Concerns regarding medicines are outlined above.  Medication changes, Labs and Tests ordered today are listed in the Patient Instructions below. Patient Instructions  Medication Instructions:  The current medical regimen is effective;  continue present plan and medications.  *If you need a refill on your cardiac medications before your next appointment, please call your  pharmacy*   Follow-Up: At [facility], you and your health needs are our priority.  As part of our continuing mission to provide you with exceptional heart care, we have created designated Provider Care Teams.  These Care Teams include your primary Cardiologist (physician) and Advanced Practice Providers (APPs -  Physician Assistants and Nurse Practitioners) who all work together to provide you with the care you need, when you need it.  We recommend signing up for the patient portal called "MyChart".  Sign up information is provided on this After Visit Summary.  MyChart is used to connect with patients for Virtual Visits (Telemedicine).  Patients are able to view lab/test results, encounter notes, upcoming appointments, etc.  Non-urgent messages can be sent to your provider as well.   To learn more about what you can do with MyChart, go to [email or web address].    Your next appointment:   Call us to be seen within 90 days of receiving your machine.  Provider:   Nicki Guadalajara, MD (sleep)     Signed, Nicki Guadalajara, MD, [facility], ABSM Diplomate, American Board of Sleep Medicine   10/17/2022 10:52 AM    [facility] [address], [place], [place]  [address] Phone: [phone]

## 2022-10-10 NOTE — Patient Instructions (Signed)
Medication Instructions:  The current medical regimen is effective;  continue present plan and medications.  *If you need a refill on your cardiac medications before your next appointment, please call your pharmacy*   Follow-Up: At [facility], you and your health needs are our priority.  As part of our continuing mission to provide you with exceptional heart care, we have created designated Provider Care Teams.  These Care Teams include your primary Cardiologist (physician) and Advanced Practice Providers (APPs -  Physician Assistants and Nurse Practitioners) who all work together to provide you with the care you need, when you need it.  We recommend signing up for the patient portal called "MyChart".  Sign up information is provided on this After Visit Summary.  MyChart is used to connect with patients for Virtual Visits (Telemedicine).  Patients are able to view lab/test results, encounter notes, upcoming appointments, etc.  Non-urgent messages can be sent to your provider as well.   To learn more about what you can do with MyChart, go to [email or web address].    Your next appointment:   Call us to be seen within 90 days of receiving your machine.  Provider:   Nicki Guadalajara, MD (sleep)

## 2022-10-10 NOTE — Progress Notes (Signed)
Office Visit    Patient Name: Daniel Grant Date of Encounter: 10/10/2022  Primary Care Provider:  Sheliah Hatch, MD Primary Cardiologist:  Olga Millers, MD  Chief Complaint    57 year old male with a history of paroxysmal atrial fibrillation, LV dysfunction, hypertension, hyperlipidemia, type 2 diabetes, and OSA who presents for follow-up related to atrial fibrillation.  Past Medical History    Past Medical History:  Diagnosis Date   DM II (diabetes mellitus, type II), controlled (HCC)    Erectile dysfunction    Hyperlipidemia    Hypertension    Obesity    OSA (obstructive sleep apnea)    Paroxysmal atrial fibrillation (HCC)    Past Surgical History:  Procedure Laterality Date   ATRIAL FIBRILLATION ABLATION N/A 01/02/2022   Procedure: ATRIAL FIBRILLATION ABLATION;  Surgeon: Lanier Prude, MD;  Location: [facility] INVASIVE CV LAB;  Service: Cardiovascular;  Laterality: N/A;   CARDIAC CATHETERIZATION     CARDIOVERSION N/A 09/10/2020   Procedure: CARDIOVERSION;  Surgeon: Lewayne Bunting, MD;  Location: [facility] ENDOSCOPY;  Service: Cardiovascular;  Laterality: N/A;   CARDIOVERSION N/A 07/04/2022   Procedure: CARDIOVERSION;  Surgeon: Jodelle Red, MD;  Location: [facility] ENDOSCOPY;  Service: Cardiovascular;  Laterality: N/A;   FINGER SURGERY     left 1st finger   TEE WITHOUT CARDIOVERSION  01/02/2022   Procedure: TRANSESOPHAGEAL ECHOCARDIOGRAM (TEE);  Surgeon: Lanier Prude, MD;  Location: [facility] INVASIVE CV LAB;  Service: Cardiovascular;;    Allergies  No Known Allergies   Labs/Other Studies Reviewed    The following studies were reviewed today:  Echo 04/08/22: 1. Left ventricular ejection fraction, by estimation, is 60 to 65%. The  left ventricle has normal function. The left ventricle has no regional  wall motion abnormalities. There is mild concentric left ventricular  hypertrophy. Left ventricular diastolic  parameters were normal.   2. Right ventricular  systolic function is normal. The right ventricular  size is normal. There is normal pulmonary artery systolic pressure.   3. Left atrial size was moderately dilated.   4. No evidence of mitral valve regurgitation.   5. Aortic valve regurgitation is not visualized.   6. The inferior vena cava is normal in size with greater than 50%  respiratory variability, suggesting right atrial pressure of 3 mmHg.   Comparison(s): No significant change from prior study.     Recent Labs: 09/22/2022: Magnesium 1.8 10/08/2022: ALT 25; BUN 19; Creatinine, Ser 0.87; Hemoglobin 15.0; Platelets 226.0; Potassium 4.3; Sodium 138; TSH 0.77  Recent Lipid Panel    Component Value Date/Time   CHOL 118 10/08/2022 1329   CHOL 128 07/27/2020 1015   TRIG 106.0 10/08/2022 1329   HDL 35.70 (L) 10/08/2022 1329   HDL 33 (L) 07/27/2020 1015   CHOLHDL 3 10/08/2022 1329   VLDL 21.2 10/08/2022 1329   LDLCALC 61 10/08/2022 1329   LDLCALC 64 06/07/2021 1426   LDLDIRECT 80.0 12/15/2018 1359    History of Present Illness    57 year old male with the above past medical history including paroxysmal atrial fibrillation, LV dysfunction, hypertension, hyperlipidemia, type 2 diabetes, and OSA.  Cardiac catheterization in January 2001 showed normal LV function, normal coronary arteries.  Nuclear study in June 2011 showed soft tissue attenuation, no ischemia.  He underwent atrial fibrillation ablation in August 2023.  He is on Coumadin.  Echocardiogram prior to ablation showed EF 40 to 45%, mild RV dysfunction, moderate left atrial enlargement, mild to moderate right atrial enlargement, mild mitral valve regurgitation.  Most recent echocardiogram in 11/23 showed normal LV function, mild LVH, moderate left atrial enlargement.  DCCV was planned but canceled.  He was hospitalized from 4/16 to 09/12/2022 in the setting of Tikosyn load.  He spontaneously converted to sinus rhythm with the medication and did not require DCCV.  He was last seen  in the A-fib clinic on 09/22/2022 and noted daily episodes of breakthrough atrial fibrillation since leaving the hospital.  It was noted that should he continue to have frequent atrial fibrillation, repeat ablation could be considered.  He presents today for follow-up.  Since last visit has been stable overall from a cardiac standpoint.  He continues to note intermittent episodes of atrial fibrillation, this has been documented with his Kardia mobile EKG tracings.  He notes associated dizziness, denies any chest pain, dyspnea, presyncope, syncope.  Other than his ongoing intermittent atrial fibrillation, he reports feeling well.   Home Medications    Current Outpatient Medications  Medication Sig Dispense Refill   benazepril (LOTENSIN) 40 MG tablet Take 1 tablet (40 mg total) by mouth daily. 90 tablet 1   cetirizine (ZYRTEC) 10 MG tablet Take 10 mg by mouth daily.     diltiazem (CARDIZEM CD) 240 MG 24 hr capsule Take 1 capsule (240 mg total) by mouth daily. 90 capsule 1   diltiazem (CARDIZEM) 30 MG tablet TAKE 1 TABLET BY MOUTH EVERY 4 HOURS AS NEEDED FOR AFIB HEART RATE GREATER THAN 100 AS LONG AS TOP BP GREATER THAN 100 (Patient taking differently: Take 30 mg by mouth See admin instructions. 30 mg every 4 hours as needed for AFIB - HR > 100 as long as sBP > 100.) 30 tablet 1   dofetilide (TIKOSYN) 500 MCG capsule Take 1 capsule (500 mcg total) by mouth 2 (two) times daily. 180 capsule 1   Dulaglutide (TRULICITY) 0.75 MG/0.5ML SOPN INJECT 0.75 MG SUBCUTANEOUSLY ONCE A WEEK 6 mL 1   fluticasone (FLONASE) 50 MCG/ACT nasal spray Place 2 sprays into both nostrils daily. (Patient taking differently: Place 2 sprays into both nostrils daily as needed for allergies.) 16 g 5   magnesium oxide (MAG-OX) 400 MG tablet Take 1 tablet (400 mg total) by mouth daily. 90 tablet 1   metFORMIN (GLUCOPHAGE-XR) 500 MG 24 hr tablet TAKE 2 TABLETS BY MOUTH  BEFORE BREAKFAST AND 1  TABLET BY MOUTH WITH SUPPER (Patient taking  differently: Take 500-1,000 mg by mouth See admin instructions. 1000 mg in the morning before breakfast, 500 mg with supper.) 270 tablet 3   metoprolol succinate (TOPROL-XL) 100 MG 24 hr tablet TAKE 1 TABLET BY MOUTH  DAILY WITH OR IMMEDIATELY  FOLLOWING A MEAL (Patient taking differently: Take 100 mg by mouth daily with breakfast.) 90 tablet 3   Multiple Vitamin (MULTIVITAMIN) tablet Take 1 tablet by mouth daily.     Omega-3 Fatty Acids (FISH OIL PO) Take 2 capsules by mouth 2 (two) times daily.     pravastatin (PRAVACHOL) 40 MG tablet TAKE 1 TABLET BY MOUTH DAILY (Patient taking differently: Take 40 mg by mouth every evening.) 90 tablet 3   Probiotic Product (PROBIOTIC PO) Take 1 capsule by mouth daily.     sildenafil (VIAGRA) 100 MG tablet TAKE 1 TABLET BY MOUTH ONCE DAILY AS NEEDED FOR ERECTILE DYSFUNCTION 10 tablet 0   warfarin (COUMADIN) 10 MG tablet TAKE 1/2 TO 1 TABLET BY MOUTH  DAILY AS DIRECTED BY COUMADIN  CLINIC (Patient taking differently: Take 5-10 mg by mouth See admin instructions. 5 mg  once daily in the evenings on Sunday, Friday 10 mg on every other day.) 90 tablet 3   No current facility-administered medications for this visit.     Review of Systems   He denies chest pain, palpitations, dyspnea, pnd, orthopnea, n, v, syncope, edema, weight gain, or early satiety. All other systems reviewed and are otherwise negative except as noted above.   Physical Exam    VS:  BP 118/82   Pulse (!) 57   Ht 5\' 7"  (1.702 m)   Wt 268 lb 3.2 oz (121.7 kg)   SpO2 97%   BMI 42.01 kg/m   GEN: Well nourished, well developed, in no acute distress. HEENT: normal. Neck: Supple, no JVD, carotid bruits, or masses. Cardiac: RRR, no murmurs, rubs, or gallops. No clubbing, cyanosis, edema.  Radials/DP/PT 2+ and equal bilaterally.  Respiratory:  Respirations regular and unlabored, clear to auscultation bilaterally. GI: Soft, nontender, nondistended, BS + x 4. MS: no deformity or atrophy. Skin:  warm and dry, no rash. Neuro:  Strength and sensation are intact. Psych: Normal affect.  Accessory Clinical Findings    ECG personally reviewed by me today -sinus bradycardia, 57 bpm, QTc 404 ms- no acute changes.   Lab Results  Component Value Date   WBC 9.5 10/08/2022   HGB 15.0 10/08/2022   HCT 44.0 10/08/2022   MCV 84.2 10/08/2022   PLT 226.0 10/08/2022   Lab Results  Component Value Date   CREATININE 0.87 10/08/2022   BUN 19 10/08/2022   NA 138 10/08/2022   K 4.3 10/08/2022   CL 105 10/08/2022   CO2 26 10/08/2022   Lab Results  Component Value Date   ALT 25 10/08/2022   AST 22 10/08/2022   ALKPHOS 86 10/08/2022   BILITOT 0.6 10/08/2022   Lab Results  Component Value Date   CHOL 118 10/08/2022   HDL 35.70 (L) 10/08/2022   LDLCALC 61 10/08/2022   LDLDIRECT 80.0 12/15/2018   TRIG 106.0 10/08/2022   CHOLHDL 3 10/08/2022    Lab Results  Component Value Date   HGBA1C 6.7 (H) 10/08/2022    Assessment & Plan    1. Paroxysmal atrial fibrillation: S/p multiple DCCV's, s/p ablation in 12/2021.  Underwent Tikosyn load in 08/2022.  He is in sinus rhythm today.  He continues to have daily episodes of breakthrough atrial fibrillation with associated dizziness.  He has follow-up with EP to discuss possible repeat ablation.  Recent labs including BMET, CBC, and magnesium were stable.  QT stable on today's EKG.  Reviewed ED precautions.  Continue diltiazem, Tikosyn, metoprolol, Coumadin.  2. LV dysfunction: Prior EF 40 to 45%, most recent echo in 03/2022 showed normal LV function, mild LVH, moderate left atrial enlargement.  Euvolemic and well compensated on exam.  Continue metoprolol.  3. Hypertension: BP well controlled. Continue current antihypertensive regimen.   4. Hyperlipidemia: LDL was 47 in 12/2021.  Continue pravastatin.  5. Type 2 diabetes: A1c was 8.1 in 04/2022.  Monitored and managed per PCP.   6. OSA: He has follow-up scheduled with Dr. Tresa Endo today.   7.  Disposition: Keep scheduled follow-up with EP/afib clinic. Follow-up in 4-6 months with Dr. Jens Som.      Joylene Grapes, NP 10/10/2022, 2:05 PM

## 2022-10-10 NOTE — Patient Instructions (Addendum)
Medication Instructions:  Your physician recommends that you continue on your current medications as directed. Please refer to the Current Medication list given to you today.  *If you need a refill on your cardiac medications before your next appointment, please call your pharmacy*   Lab Work: NONE ordered at this time of appointment   If you have labs (blood work) drawn today and your tests are completely normal, you will receive your results only by: MyChart Message (if you have MyChart) OR A paper copy in the mail If you have any lab test that is abnormal or we need to change your treatment, we will call you to review the results.   Testing/Procedures: NONE ordered at this time of appointment     Follow-Up: At [facility], you and your health needs are our priority.  As part of our continuing mission to provide you with exceptional heart care, we have created designated Provider Care Teams.  These Care Teams include your primary Cardiologist (physician) and Advanced Practice Providers (APPs -  Physician Assistants and Nurse Practitioners) who all work together to provide you with the care you need, when you need it.  We recommend signing up for the patient portal called "MyChart".  Sign up information is provided on this After Visit Summary.  MyChart is used to connect with patients for Virtual Visits (Telemedicine).  Patients are able to view lab/test results, encounter notes, upcoming appointments, etc.  Non-urgent messages can be sent to your provider as well.   To learn more about what you can do with MyChart, go to [email or web address].    Your next appointment:   4-6 month(s)  Provider:   Olga Millers, MD     Other Instructions

## 2022-10-12 ENCOUNTER — Encounter: Payer: Self-pay | Admitting: Nurse Practitioner

## 2022-10-13 ENCOUNTER — Telehealth: Payer: Self-pay | Admitting: Cardiovascular Disease

## 2022-10-13 ENCOUNTER — Encounter: Payer: Self-pay | Admitting: Cardiovascular Disease

## 2022-10-13 NOTE — Telephone Encounter (Signed)
Patient stated his CPAP went out and he wants to get a new CPAP machine.

## 2022-10-14 ENCOUNTER — Telehealth: Payer: Self-pay | Admitting: *Deleted

## 2022-10-14 LAB — POCT INR: INR: 1.6 — AB (ref 2.0–3.0)

## 2022-10-14 NOTE — Telephone Encounter (Signed)
Called pt's wife since INR is due. Wife states pt has been out cutting grass and just got in so will be a little later. Advised will follow up tomorrow.

## 2022-10-15 ENCOUNTER — Ambulatory Visit (INDEPENDENT_AMBULATORY_CARE_PROVIDER_SITE_OTHER): Payer: 59 | Admitting: Cardiovascular Disease

## 2022-10-15 DIAGNOSIS — Z5181 Encounter for therapeutic drug level monitoring: Secondary | ICD-10-CM

## 2022-10-15 DIAGNOSIS — I480 Paroxysmal atrial fibrillation: Secondary | ICD-10-CM | POA: Diagnosis not present

## 2022-10-15 NOTE — Patient Instructions (Signed)
Description   Call wife at [phone]; Spoke with pt's wife on DPR, advised to have pt since he took an extra 1/2 tablet last night then START taking Warfarin 1 tablet daily except for 1/2 tablet on  Fridays.  Recheck INR in 2 weeks.   Coumadin Clinic [phone] *Please discuss DOAC option with spouse during next INR check - note on file to discuss after insurance change*  Holding off for now.

## 2022-10-17 ENCOUNTER — Encounter: Payer: Self-pay | Admitting: Cardiovascular Disease

## 2022-10-22 ENCOUNTER — Ambulatory Visit: Payer: 59 | Admitting: Student

## 2022-10-28 LAB — HM DIABETES EYE EXAM

## 2022-10-28 LAB — POCT INR: INR: 2.3 (ref 2.0–3.0)

## 2022-10-29 ENCOUNTER — Ambulatory Visit (INDEPENDENT_AMBULATORY_CARE_PROVIDER_SITE_OTHER): Payer: 59 | Admitting: Cardiovascular Disease

## 2022-10-29 DIAGNOSIS — I480 Paroxysmal atrial fibrillation: Secondary | ICD-10-CM

## 2022-10-29 DIAGNOSIS — Z5181 Encounter for therapeutic drug level monitoring: Secondary | ICD-10-CM

## 2022-10-29 NOTE — Patient Instructions (Signed)
Description   Call wife at [phone]; Spoke with pt's wife on DPR, advised to have pt continue taking Warfarin 1 tablet daily except for 1/2 tablet on  Fridays.  Recheck INR in 2 weeks.   Coumadin Clinic [phone] *Please discuss DOAC option with spouse during next INR check - note on file to discuss after insurance change*  Holding off for now.

## 2022-10-30 ENCOUNTER — Ambulatory Visit: Payer: 59 | Attending: Student | Admitting: Physician Assistant

## 2022-10-30 ENCOUNTER — Encounter: Payer: Self-pay | Admitting: Physician Assistant

## 2022-10-30 VITALS — BP 124/78 | HR 71 | Ht 67.0 in | Wt 265.8 lb

## 2022-10-30 DIAGNOSIS — I10 Essential (primary) hypertension: Secondary | ICD-10-CM

## 2022-10-30 DIAGNOSIS — I4819 Other persistent atrial fibrillation: Secondary | ICD-10-CM | POA: Diagnosis not present

## 2022-10-30 DIAGNOSIS — D6869 Other thrombophilia: Secondary | ICD-10-CM | POA: Diagnosis not present

## 2022-10-30 DIAGNOSIS — Z5181 Encounter for therapeutic drug level monitoring: Secondary | ICD-10-CM

## 2022-10-30 DIAGNOSIS — I428 Other cardiomyopathies: Secondary | ICD-10-CM

## 2022-10-30 DIAGNOSIS — Z79899 Other long term (current) drug therapy: Secondary | ICD-10-CM

## 2022-10-30 NOTE — Progress Notes (Signed)
Cardiology Office Note Date:  10/30/2022  Patient ID:  [name], [name] [birth year], MRN [id number] PCP:  Sheliah Hatch, MD  Cardiologist:  Dr. Jens Som Electrophysiologist: Dr. Lalla Brothers    Chief Complaint:  s/p Tikosyn load  History of Present Illness: Daniel Grant is a 57 y.o. male with history of HTN, HLD, OSA, DM, CM, and Afib   Admitted 09/09/22 for Tikosyn initiation, converted with drug, required mag supplementation and continued at d/c  Saw AF ib clinic 09/22/22, he was in SR but reported frequent episodes of AFib since his discharge, no changes were made, with plans to observe his rhythm behavior on Tikosyn over time.  He saw E. Monge, NP 10/10/22, he was in SR, again, mentioned frquent AFib episodes, documented with his Lourena Simmonds.  He was euvolemic, , planned to see Dr. Tresa Endo for his OSA as well, no changes were made.  CPAP compliance is excellent with 100% use.   TODAY He is doing well! While he is still having intermittent AFib, they are net every day and are short lived, this is a clear improvement in his burden and is quite happy with it. 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 His pressure on his CPAP recently increased and is sleeping better as well.  No CP, no SOB No near syncope or syncope. No bleeding  He does a kardia tracing every morning and almost every day he starts the day in normal rhythm. He has not gotten any watch alerts for elevated or slow HRs    AFib/AAD Hx 01/02/22: PVI ablation Tikosyn started April 2024   Past Medical History:  Diagnosis Date   DM II (diabetes mellitus, type II), controlled (HCC)    Erectile dysfunction    Hyperlipidemia    Hypertension    Obesity    OSA (obstructive sleep apnea)    Paroxysmal atrial fibrillation (HCC)     Past Surgical History:  Procedure Laterality Date   ATRIAL FIBRILLATION ABLATION  N/A 01/02/2022   Procedure: ATRIAL FIBRILLATION ABLATION;  Surgeon: Lanier Prude, MD;  Location: [facility] INVASIVE CV LAB;  Service: Cardiovascular;  Laterality: N/A;   CARDIAC CATHETERIZATION     CARDIOVERSION N/A 09/10/2020   Procedure: CARDIOVERSION;  Surgeon: Lewayne Bunting, MD;  Location: [facility] ENDOSCOPY;  Service: Cardiovascular;  Laterality: N/A;   CARDIOVERSION N/A 07/04/2022   Procedure: CARDIOVERSION;  Surgeon: Jodelle Red, MD;  Location: [facility] ENDOSCOPY;  Service: Cardiovascular;  Laterality: N/A;   FINGER SURGERY     left 1st finger   TEE WITHOUT CARDIOVERSION  01/02/2022   Procedure: TRANSESOPHAGEAL ECHOCARDIOGRAM (TEE);  Surgeon: Lanier Prude, MD;  Location: [facility] INVASIVE CV LAB;  Service: Cardiovascular;;    Current Outpatient Medications  Medication Sig Dispense Refill   benazepril (LOTENSIN) 40 MG tablet Take 1 tablet (40 mg total) by mouth daily. 90 tablet 1   cetirizine (ZYRTEC) 10 MG tablet Take 10 mg by mouth daily.     dofetilide (TIKOSYN) 500 MCG capsule Take 1 capsule (500 mcg total) by mouth 2 (two) times daily. 180 capsule 1   Dulaglutide (TRULICITY) 0.75 MG/0.5ML SOPN INJECT 0.75 MG SUBCUTANEOUSLY ONCE A WEEK 6 mL 1   fluticasone (FLONASE) 50 MCG/ACT nasal spray Place 2 sprays into both nostrils daily. (Patient taking differently: Place 2 sprays into both nostrils daily as needed for allergies.) 16 g 5   magnesium  oxide (MAG-OX) 400 MG tablet Take 1 tablet (400 mg total) by mouth daily. 90 tablet 1   metFORMIN (GLUCOPHAGE-XR) 500 MG 24 hr tablet TAKE 2 TABLETS BY MOUTH  BEFORE BREAKFAST AND 1  TABLET BY MOUTH WITH SUPPER (Patient taking differently: Take 500-1,000 mg by mouth See admin instructions. 1000 mg in the morning before breakfast, 500 mg with supper.) 270 tablet 3   metoprolol succinate (TOPROL-XL) 100 MG 24 hr tablet TAKE 1 TABLET BY MOUTH  DAILY WITH OR IMMEDIATELY  FOLLOWING A MEAL (Patient taking differently: Take 100 mg by mouth daily with  breakfast.) 90 tablet 3   Multiple Vitamin (MULTIVITAMIN) tablet Take 1 tablet by mouth daily.     Omega-3 Fatty Acids (FISH OIL PO) Take 2 capsules by mouth 2 (two) times daily.     pravastatin (PRAVACHOL) 40 MG tablet TAKE 1 TABLET BY MOUTH DAILY (Patient taking differently: Take 40 mg by mouth every evening.) 90 tablet 3   Probiotic Product (PROBIOTIC PO) Take 1 capsule by mouth daily.     sildenafil (VIAGRA) 100 MG tablet TAKE 1 TABLET BY MOUTH ONCE DAILY AS NEEDED FOR ERECTILE DYSFUNCTION 10 tablet 0   warfarin (COUMADIN) 10 MG tablet TAKE 1/2 TO 1 TABLET BY MOUTH  DAILY AS DIRECTED BY COUMADIN  CLINIC (Patient taking differently: Take 5-10 mg by mouth See admin instructions. 5 mg once daily in the evenings on Sunday, Friday 10 mg on every other day.) 90 tablet 3   diltiazem (CARDIZEM CD) 240 MG 24 hr capsule Take 1 capsule (240 mg total) by mouth daily. (Patient not taking: Reported on 10/30/2022) 90 capsule 1   diltiazem (CARDIZEM) 30 MG tablet TAKE 1 TABLET BY MOUTH EVERY 4 HOURS AS NEEDED FOR AFIB HEART RATE GREATER THAN 100 AS LONG AS TOP BP GREATER THAN 100 (Patient not taking: Reported on 10/30/2022) 30 tablet 1   No current facility-administered medications for this visit.    Allergies:   Patient has no known allergies.   Social History:  The patient  reports that he quit smoking about 28 years ago. His smoking use included cigarettes. He has never used smokeless tobacco. He reports that he does not currently use alcohol after a past usage of about 5.0 standard drinks of alcohol per week. He reports that he does not use drugs.   Family History:  The patient's family history includes Alcohol abuse in his father; Diabetes in his mother; Heart attack in his maternal grandmother and paternal uncle; Heart disease in his mother; Hypertension in his mother.  ROS:  Please see the history of present illness.    All other systems are reviewed and otherwise negative.   PHYSICAL EXAM:  VS:  BP  124/78   Pulse 71   Ht 5\' 7"  (1.702 m)   Wt 265 lb 12.8 oz (120.6 kg)   SpO2 96%   BMI 41.63 kg/m  BMI: Body mass index is 41.63 kg/m. Well nourished, well developed, in no acute distress HEENT: normocephalic, atraumatic Neck: no JVD, carotid bruits or masses Cardiac:  irreg-irreg; no significant murmurs, no rubs, or gallops Lungs:  CTA b/l, no wheezing, rhonchi or rales Abd: soft, nontender MS: no deformity or atrophy Ext: no edema Skin: warm and dry, no rash Neuro:  No gross deficits appreciated Psych: euthymic mood, full affect    EKG:  Done today and reviewed by myself shows  AFib 11bpm, QTc  04/08/22: TTE 1. Left ventricular ejection fraction, by estimation, is 60 to  65%. The  left ventricle has normal function. The left ventricle has no regional  wall motion abnormalities. There is mild concentric left ventricular  hypertrophy. Left ventricular diastolic  parameters were normal.   2. Right ventricular systolic function is normal. The right ventricular  size is normal. There is normal pulmonary artery systolic pressure.   3. Left atrial size was moderately dilated.   4. No evidence of mitral valve regurgitation.   5. Aortic valve regurgitation is not visualized.   6. The inferior vena cava is normal in size with greater than 50%  respiratory variability, suggesting right atrial pressure of 3 mmHg.   Comparison(s): No significant change from prior study.   01/02/22: EPS/ablation  CONCLUSIONS: 1. Successful PVI 2. Successful ablation/isolation of the posterior wall 3. Intracardiac echo reveals trivial pericardial effusion and normal left atrial architecture. 4. Preprocedural TEE showed no left atrial appendage thrombus. 5. No early apparent complications. 6. Colchicine 0.6mg  PO BID x 5 days 7. Protonix 40mg  PO daily x 45 days 8. Therapeutic Lovenox as bridge to therapeutic INR with repeat INR check Monday.  Recent Labs: 09/22/2022: Magnesium 1.8 10/08/2022:  ALT 25; BUN 19; Creatinine, Ser 0.87; Hemoglobin 15.0; Platelets 226.0; Potassium 4.3; Sodium 138; TSH 0.77  10/08/2022: Cholesterol 118; HDL 35.70; LDL Cholesterol 61; Total CHOL/HDL Ratio 3; Triglycerides 106.0; VLDL 21.2   CrCl cannot be calculated (Patient's most recent lab result is older than the maximum 21 days allowed.).   Wt Readings from Last 3 Encounters:  10/30/22 265 lb 12.8 oz (120.6 kg)  10/10/22 268 lb (121.6 kg)  10/10/22 268 lb 3.2 oz (121.7 kg)     Other studies reviewed: Additional studies/records reviewed today include: summarized above  ASSESSMENT AND PLAN:  Persistent AFib CHA2DS2Vasc is 3, on warfarin on tikosyn w/stable QTc Markedly improved burden  No changes today Med list looks ok Re-enforced teaching Labs today     HTN Looks ok   CM Recovered LVEF Compensated   OSA Compliant w/CPAP  5. Secondary hypercoagulable state    Disposition: F/u with [place] in 3 mo, sooner if needed  Current medicines are reviewed at length with the patient today.  The patient did not have any concerns regarding medicines.  Norma Fredrickson, PA-C 10/30/2022 3:27 PM     [facility] [address] [place] [address] [phone] (office)  [phone] (fax)

## 2022-10-30 NOTE — Patient Instructions (Signed)
Medication Instructions:    Your physician recommends that you continue on your current medications as directed. Please refer to the Current Medication list given to you today.   *If you need a refill on your cardiac medications before your next appointment, please call your pharmacy*   Lab Work:  BMET AND MAG TODAY   If you have labs (blood work) drawn today and your tests are completely normal, you will receive your results only by: MyChart Message (if you have MyChart) OR A paper copy in the mail If you have any lab test that is abnormal or we need to change your treatment, we will call you to review the results.   Testing/Procedures: NONE ORDERED  TODAY     Follow-Up: At [facility], you and your health needs are our priority.  As part of our continuing mission to provide you with exceptional heart care, we have created designated Provider Care Teams.  These Care Teams include your primary Cardiologist (physician) and Advanced Practice Providers (APPs -  Physician Assistants and Nurse Practitioners) who all work together to provide you with the care you need, when you need it.  We recommend signing up for the patient portal called "MyChart".  Sign up information is provided on this After Visit Summary.  MyChart is used to connect with patients for Virtual Visits (Telemedicine).  Patients are able to view lab/test results, encounter notes, upcoming appointments, etc.  Non-urgent messages can be sent to your provider as well.   To learn more about what you can do with MyChart, go to [email or web address].    Your next appointment:   3 month(s)  Provider:   You may see Lanier Prude, MD or one of the following Advanced Practice Providers on your designated Care Team:   Francis Dowse, [place]    Other Instructions

## 2022-10-31 LAB — BASIC METABOLIC PANEL
BUN/Creatinine Ratio: 22 — ABNORMAL HIGH (ref 9–20)
BUN: 19 mg/dL (ref 6–24)
CO2: 23 mmol/L (ref 20–29)
Calcium: 9.2 mg/dL (ref 8.7–10.2)
Chloride: 104 mmol/L (ref 96–106)
Creatinine, Ser: 0.88 mg/dL (ref 0.76–1.27)
Glucose: 115 mg/dL — ABNORMAL HIGH (ref 70–99)
Potassium: 4.4 mmol/L (ref 3.5–5.2)
Sodium: 141 mmol/L (ref 134–144)
eGFR: 99 mL/min/{1.73_m2} (ref >59)

## 2022-10-31 LAB — MAGNESIUM: Magnesium: 2 mg/dL (ref 1.6–2.3)

## 2022-11-10 ENCOUNTER — Telehealth: Payer: Self-pay | Admitting: Cardiovascular Disease

## 2022-11-10 ENCOUNTER — Other Ambulatory Visit: Payer: Self-pay | Admitting: Family Medicine

## 2022-11-10 ENCOUNTER — Other Ambulatory Visit: Payer: Self-pay | Admitting: Cardiology

## 2022-11-10 DIAGNOSIS — G4733 Obstructive sleep apnea (adult) (pediatric): Secondary | ICD-10-CM

## 2022-11-10 DIAGNOSIS — E119 Type 2 diabetes mellitus without complications: Secondary | ICD-10-CM

## 2022-11-10 DIAGNOSIS — I10 Essential (primary) hypertension: Secondary | ICD-10-CM

## 2022-11-10 DIAGNOSIS — I480 Paroxysmal atrial fibrillation: Secondary | ICD-10-CM

## 2022-11-10 NOTE — Telephone Encounter (Signed)
Patient stated he is follow-up on getting new sleep equipment as his current machine is not working and has been trying to get an upgrade for about a month.

## 2022-11-11 ENCOUNTER — Ambulatory Visit (INDEPENDENT_AMBULATORY_CARE_PROVIDER_SITE_OTHER): Payer: 59 | Admitting: Cardiovascular Disease

## 2022-11-11 DIAGNOSIS — Z5181 Encounter for therapeutic drug level monitoring: Secondary | ICD-10-CM | POA: Diagnosis not present

## 2022-11-11 DIAGNOSIS — I480 Paroxysmal atrial fibrillation: Secondary | ICD-10-CM

## 2022-11-11 LAB — POCT INR: INR: 1.8 — AB (ref 2.0–3.0)

## 2022-11-12 NOTE — Telephone Encounter (Signed)
New order sent to [facility] today for CPAP machine and supplies. 11/12/22

## 2022-11-18 ENCOUNTER — Telehealth: Payer: Self-pay | Admitting: *Deleted

## 2022-11-18 LAB — POCT INR: INR: 2.3 (ref 2.0–3.0)

## 2022-11-18 NOTE — Telephone Encounter (Signed)
Called pt's wife since INR issue; there was no answer therefore, left a message for them to call back regarding this.

## 2022-11-19 ENCOUNTER — Ambulatory Visit (INDEPENDENT_AMBULATORY_CARE_PROVIDER_SITE_OTHER): Payer: 59 | Admitting: Cardiology

## 2022-11-19 DIAGNOSIS — Z5181 Encounter for therapeutic drug level monitoring: Secondary | ICD-10-CM

## 2022-11-19 DIAGNOSIS — I480 Paroxysmal atrial fibrillation: Secondary | ICD-10-CM

## 2022-11-19 NOTE — Patient Instructions (Signed)
Description   Call wife at [phone]; Spoke with pt's wife on DPR, advised to continue taking Warfarin 1 tablet daily except for 1/2 tablet on Fridays.  Recheck INR in 2 weeks.   [facility] [phone] *Please discuss DOAC option with spouse during next INR check - note on file to discuss after insurance change*  Holding off for now.

## 2022-12-02 ENCOUNTER — Ambulatory Visit (INDEPENDENT_AMBULATORY_CARE_PROVIDER_SITE_OTHER): Payer: 59

## 2022-12-02 ENCOUNTER — Telehealth: Payer: Self-pay

## 2022-12-02 DIAGNOSIS — Z5181 Encounter for therapeutic drug level monitoring: Secondary | ICD-10-CM

## 2022-12-02 DIAGNOSIS — I480 Paroxysmal atrial fibrillation: Secondary | ICD-10-CM | POA: Diagnosis not present

## 2022-12-02 LAB — POCT INR: INR: 1.8 — AB (ref 2.0–3.0)

## 2022-12-02 NOTE — Telephone Encounter (Signed)
done

## 2022-12-02 NOTE — Patient Instructions (Signed)
Description   Call wife at [phone]; Spoke with pt's wife on DPR, advised to have pt take 1.5 tablets today, then start taking Warfarin 1 tablet daily.  Recheck INR in 2 weeks.   [facility] [phone] *Please discuss DOAC option with spouse during next INR check - note on file to discuss after insurance change*  Holding off for now.

## 2022-12-14 ENCOUNTER — Other Ambulatory Visit: Payer: Self-pay | Admitting: Family Medicine

## 2022-12-14 DIAGNOSIS — E119 Type 2 diabetes mellitus without complications: Secondary | ICD-10-CM

## 2022-12-15 ENCOUNTER — Other Ambulatory Visit: Payer: Self-pay

## 2022-12-15 DIAGNOSIS — G4733 Obstructive sleep apnea (adult) (pediatric): Secondary | ICD-10-CM

## 2022-12-16 ENCOUNTER — Telehealth: Payer: Self-pay

## 2022-12-16 ENCOUNTER — Ambulatory Visit (INDEPENDENT_AMBULATORY_CARE_PROVIDER_SITE_OTHER): Payer: 59

## 2022-12-16 DIAGNOSIS — I480 Paroxysmal atrial fibrillation: Secondary | ICD-10-CM | POA: Diagnosis not present

## 2022-12-16 DIAGNOSIS — Z5181 Encounter for therapeutic drug level monitoring: Secondary | ICD-10-CM | POA: Diagnosis not present

## 2022-12-16 LAB — POCT INR: INR: 1.9 — AB (ref 2.0–3.0)

## 2022-12-16 NOTE — Patient Instructions (Signed)
Description   Call wife at [phone]; Spoke with pt's wife on DPR, advised to have pt take 1.5 tablets today, then continue taking Warfarin 1 tablet daily.  Recheck INR in 2 weeks.   [facility] [phone] *Please discuss DOAC option with spouse during next INR check - note on file to discuss after insurance change*  Holding off for now.

## 2022-12-16 NOTE — Telephone Encounter (Signed)
Lp's wife message to check INR.

## 2022-12-29 ENCOUNTER — Encounter: Payer: Self-pay | Admitting: Cardiovascular Disease

## 2022-12-30 ENCOUNTER — Ambulatory Visit (INDEPENDENT_AMBULATORY_CARE_PROVIDER_SITE_OTHER): Payer: 59 | Admitting: *Deleted

## 2022-12-30 ENCOUNTER — Telehealth: Payer: Self-pay

## 2022-12-30 ENCOUNTER — Telehealth: Payer: Self-pay | Admitting: *Deleted

## 2022-12-30 DIAGNOSIS — Z5181 Encounter for therapeutic drug level monitoring: Secondary | ICD-10-CM | POA: Diagnosis not present

## 2022-12-30 DIAGNOSIS — I480 Paroxysmal atrial fibrillation: Secondary | ICD-10-CM

## 2022-12-30 LAB — POCT INR: INR: 2.7 (ref 2.0–3.0)

## 2022-12-30 NOTE — Telephone Encounter (Signed)
Pt's INR due today. Called pt, no answer. Left message on voicemail.

## 2022-12-30 NOTE — Patient Instructions (Signed)
Description   Call wife at [phone]; Spoke with pt's wife on DPR, advised to have pt to continue taking Warfarin 1 tablet daily.  Recheck INR in 2 weeks.   [facility] [phone] *Please discuss DOAC option with spouse during next INR check - note on file to discuss after insurance change*  Holding off for now.

## 2022-12-30 NOTE — Telephone Encounter (Signed)
Prior Authorization for HST sent to [facility] via web portal. Tracking Number . 8/6 IN PROGRESS- Decision ID #: [id number]- reference number is: [id number].

## 2023-01-08 ENCOUNTER — Encounter (INDEPENDENT_AMBULATORY_CARE_PROVIDER_SITE_OTHER): Payer: Self-pay

## 2023-01-11 ENCOUNTER — Other Ambulatory Visit: Payer: Self-pay | Admitting: Family Medicine

## 2023-01-11 DIAGNOSIS — E119 Type 2 diabetes mellitus without complications: Secondary | ICD-10-CM

## 2023-01-13 ENCOUNTER — Ambulatory Visit (INDEPENDENT_AMBULATORY_CARE_PROVIDER_SITE_OTHER): Payer: 59 | Admitting: *Deleted

## 2023-01-13 DIAGNOSIS — Z5181 Encounter for therapeutic drug level monitoring: Secondary | ICD-10-CM | POA: Diagnosis not present

## 2023-01-13 DIAGNOSIS — I480 Paroxysmal atrial fibrillation: Secondary | ICD-10-CM

## 2023-01-13 LAB — POCT INR: INR: 3.8 — AB (ref 2.0–3.0)

## 2023-01-13 NOTE — Patient Instructions (Signed)
Description   Call wife at [phone]; Spoke with pt's wife on DPR, advised to have pt NOT to take any warfarin tonight then continue taking Warfarin 1 tablet daily. Recheck INR in 2 weeks.   [facility] [phone] *Please discuss DOAC option with spouse during next INR check - note on file to discuss after insurance change*  Holding off for now.

## 2023-01-20 ENCOUNTER — Other Ambulatory Visit: Payer: Self-pay | Admitting: Cardiology

## 2023-01-27 ENCOUNTER — Ambulatory Visit (INDEPENDENT_AMBULATORY_CARE_PROVIDER_SITE_OTHER): Payer: 59 | Admitting: *Deleted

## 2023-01-27 DIAGNOSIS — Z5181 Encounter for therapeutic drug level monitoring: Secondary | ICD-10-CM

## 2023-01-27 DIAGNOSIS — I480 Paroxysmal atrial fibrillation: Secondary | ICD-10-CM | POA: Diagnosis not present

## 2023-01-27 LAB — POCT INR: INR: 4.3 — AB (ref 2.0–3.0)

## 2023-01-27 NOTE — Patient Instructions (Signed)
Description   Call wife at [phone]; Spoke with pt's wife on DPR, advised to have pt NOT to take any warfarin tonight and take 1/2 tablet on Wednesday then START taking Warfarin 1 tablet daily except 1/2 tablet on Wednesdays. Recheck INR in 1 week (normally 2 weeks).   [facility] [phone] *Please discuss DOAC option with spouse during next INR check - note on file to discuss after insurance change*  Holding off for now.

## 2023-02-02 ENCOUNTER — Ambulatory Visit: Payer: 59 | Admitting: Physician Assistant

## 2023-02-02 ENCOUNTER — Ambulatory Visit (HOSPITAL_COMMUNITY)
Admission: RE | Admit: 2023-02-02 | Discharge: 2023-02-02 | Disposition: A | Discharge status: Home / Self Care | Payer: 59 | Source: Ambulatory Visit | Attending: Physician Assistant | Admitting: Physician Assistant

## 2023-02-02 VITALS — BP 172/90 | HR 57 | Ht 67.0 in | Wt 268.6 lb

## 2023-02-02 DIAGNOSIS — G4733 Obstructive sleep apnea (adult) (pediatric): Secondary | ICD-10-CM | POA: Diagnosis not present

## 2023-02-02 DIAGNOSIS — Z6841 Body Mass Index (BMI) 40.0 and over, adult: Secondary | ICD-10-CM | POA: Diagnosis not present

## 2023-02-02 DIAGNOSIS — E785 Hyperlipidemia, unspecified: Secondary | ICD-10-CM | POA: Insufficient documentation

## 2023-02-02 DIAGNOSIS — D6869 Other thrombophilia: Secondary | ICD-10-CM | POA: Insufficient documentation

## 2023-02-02 DIAGNOSIS — Z79899 Other long term (current) drug therapy: Secondary | ICD-10-CM | POA: Insufficient documentation

## 2023-02-02 DIAGNOSIS — E669 Obesity, unspecified: Secondary | ICD-10-CM | POA: Diagnosis not present

## 2023-02-02 DIAGNOSIS — Z7984 Long term (current) use of oral hypoglycemic drugs: Secondary | ICD-10-CM | POA: Insufficient documentation

## 2023-02-02 DIAGNOSIS — E119 Type 2 diabetes mellitus without complications: Secondary | ICD-10-CM | POA: Insufficient documentation

## 2023-02-02 DIAGNOSIS — I10 Essential (primary) hypertension: Secondary | ICD-10-CM | POA: Insufficient documentation

## 2023-02-02 DIAGNOSIS — R001 Bradycardia, unspecified: Secondary | ICD-10-CM | POA: Diagnosis not present

## 2023-02-02 DIAGNOSIS — Z7985 Long-term (current) use of injectable non-insulin antidiabetic drugs: Secondary | ICD-10-CM | POA: Insufficient documentation

## 2023-02-02 DIAGNOSIS — I4819 Other persistent atrial fibrillation: Secondary | ICD-10-CM | POA: Insufficient documentation

## 2023-02-02 DIAGNOSIS — Z5181 Encounter for therapeutic drug level monitoring: Secondary | ICD-10-CM

## 2023-02-02 DIAGNOSIS — Z7901 Long term (current) use of anticoagulants: Secondary | ICD-10-CM | POA: Insufficient documentation

## 2023-02-02 LAB — BASIC METABOLIC PANEL
Anion gap: 6 (ref 5–15)
BUN: 16 mg/dL (ref 6–20)
CO2: 24 mmol/L (ref 22–32)
Calcium: 8.7 mg/dL — ABNORMAL LOW (ref 8.9–10.3)
Chloride: 106 mmol/L (ref 98–111)
Creatinine, Ser: 0.87 mg/dL (ref 0.61–1.24)
GFR, Estimated: >60 mL/min (ref >60)
Glucose, Bld: 179 mg/dL — ABNORMAL HIGH (ref 70–99)
Potassium: 4.2 mmol/L (ref 3.5–5.1)
Sodium: 136 mmol/L (ref 135–145)

## 2023-02-02 LAB — MAGNESIUM: Magnesium: 1.8 mg/dL (ref 1.7–2.4)

## 2023-02-02 NOTE — Progress Notes (Signed)
Primary Care Physician: Sheliah Hatch, MD Primary Cardiologist: Dr Jens Som Primary Electrophysiologist: Dr Lalla Brothers Referring Physician: Dr Barbarann Ehlers is a 57 y.o. male with a history of DM, HTN, HLD, OSA, atrial fibrillation who presents for follow up in the [facility]. Patient is on warfarin for a CHADS2VASC score of 3. Patient is s/p afib ablation with Dr Lalla Brothers on 01/02/22. TEE at the time of ablation showed decreased systolic function with EF 40-45%.  Patient was seen at the ED with neck pain and found to be back in rapid afib. Neck pain was felt to be trap spasm. He was discharged from the ED in afib. Patient has converted back to SR before his follow up on 01/30/22 but went out of rhythm again and is s/p DCCV 07/04/22. He has a Nutritional therapist which showed he was back out of rhythm within 24 hours.   Patient is s/p dofetilide loading 4/16-4/19/24. He converted with the medication and did not require DCCV.   On follow up today, patient reports that he has done well from an afib standpoint. He will occasionally have an episode which will last 1-2 hours. He has a Kardia mobile device that he checks daily. His BP is elevated today, he admits that he had more sodium yesterday and he also did not use his mouth strap for his CPAP.   Today, he denies symptoms of palpitations, chest pain, shortness of breath, orthopnea, PND, lower extremity edema, dizziness, presyncope, syncope, snoring, daytime somnolence, bleeding, or neurologic sequela. The patient is tolerating medications without difficulties and is otherwise without complaint today.    Atrial Fibrillation Risk Factors:  he does have symptoms or diagnosis of sleep apnea. he is compliant with CPAP therapy. he does not have a history of rheumatic fever.   Atrial Fibrillation Management history:  Previous antiarrhythmic drugs: dofetilide  Previous cardioversions: 2022, 07/04/22 Previous  ablations: 01/02/22 Anticoagulation history: warfarin    Past Medical History:  Diagnosis Date   DM II (diabetes mellitus, type II), controlled (HCC)    Erectile dysfunction    Hyperlipidemia    Hypertension    Obesity    OSA (obstructive sleep apnea)    Paroxysmal atrial fibrillation (HCC)     Current Outpatient Medications  Medication Sig Dispense Refill   benazepril (LOTENSIN) 40 MG tablet Take 1 tablet (40 mg total) by mouth daily. 90 tablet 1   cetirizine (ZYRTEC) 10 MG tablet Take 10 mg by mouth daily.     diltiazem (CARDIZEM CD) 240 MG 24 hr capsule Take 1 capsule (240 mg total) by mouth daily. 90 capsule 1   diltiazem (CARDIZEM) 30 MG tablet TAKE 1 TABLET BY MOUTH EVERY 4 HOURS AS NEEDED FOR AFIB HEART RATE GREATER THAN 100 AS LONG AS TOP BP GREATER THAN 100 30 tablet 1   dofetilide (TIKOSYN) 500 MCG capsule Take 1 capsule (500 mcg total) by mouth 2 (two) times daily. 180 capsule 1   Dulaglutide (TRULICITY) 0.75 MG/0.5ML SOPN INJECT 0.75MG  UNDER THE SKIN ONCE WEEKLY 4 mL 0   fluticasone (FLONASE) 50 MCG/ACT nasal spray Place 2 sprays into both nostrils daily. (Patient taking differently: Place 2 sprays into both nostrils daily as needed for allergies.) 16 g 5   magnesium oxide (MAG-OX) 400 MG tablet Take 1 tablet (400 mg total) by mouth daily. 90 tablet 1   metFORMIN (GLUCOPHAGE-XR) 500 MG 24 hr tablet TAKE 2 TABLETS BY MOUTH BEFORE  BREAKFAST AND 1  TABLET BY MOUTH  WITH SUPPER 270 tablet 3   metoprolol succinate (TOPROL-XL) 100 MG 24 hr tablet TAKE 1 TABLET BY MOUTH DAILY  WITH OR IMMEDIATELY FOLLOWING A  MEAL 90 tablet 3   Multiple Vitamin (MULTIVITAMIN) tablet Take 1 tablet by mouth daily.     Omega-3 Fatty Acids (FISH OIL PO) Take 2 capsules by mouth 2 (two) times daily.     pravastatin (PRAVACHOL) 40 MG tablet TAKE 1 TABLET BY MOUTH DAILY (Patient taking differently: Take 40 mg by mouth every evening.) 90 tablet 3   Probiotic Product (PROBIOTIC PO) Take 1 capsule by mouth  daily.     sildenafil (VIAGRA) 100 MG tablet TAKE 1 TABLET BY MOUTH ONCE DAILY AS NEEDED FOR ERECTILE DYSFUNCTION 10 tablet 0   warfarin (COUMADIN) 10 MG tablet TAKE 1/2 TO 1 TABLET BY MOUTH  DAILY AS DIRECTED BY COUMADIN  CLINIC 90 tablet 3   No current facility-administered medications for this encounter.    ROS- All systems are reviewed and negative except as per the HPI above.  Physical Exam: Vitals:   02/02/23 1504  BP: (!) 172/90  Pulse: (!) 57  Weight: 121.8 kg  Height: 5\' 7"  (1.702 m)     GEN: Well nourished, well developed in no acute distress NECK: No JVD; No carotid bruits CARDIAC: Regular rate and rhythm, no murmurs, rubs, gallops RESPIRATORY:  Clear to auscultation without rales, wheezing or rhonchi  ABDOMEN: Soft, non-tender, non-distended EXTREMITIES:  No edema; No deformity    Wt Readings from Last 3 Encounters:  02/02/23 121.8 kg  10/30/22 120.6 kg  10/10/22 121.6 kg    EKG today demonstrates  SB Vent. rate 57 BPM PR interval 160 ms QRS duration 94 ms QT/QTcB 460/447 ms  Echo 04/08/22 demonstrated   1. Left ventricular ejection fraction, by estimation, is 60 to 65%. The  left ventricle has normal function. The left ventricle has no regional  wall motion abnormalities. There is mild concentric left ventricular  hypertrophy. Left ventricular diastolic  parameters were normal.   2. Right ventricular systolic function is normal. The right ventricular  size is normal. There is normal pulmonary artery systolic pressure.   3. Left atrial size was moderately dilated.   4. No evidence of mitral valve regurgitation.   5. Aortic valve regurgitation is not visualized.   6. The inferior vena cava is normal in size with greater than 50%  respiratory variability, suggesting right atrial pressure of 3 mmHg.   Comparison(s): No significant change from prior study.   Epic records are reviewed at length today  CHA2DS2-VASc Score = 3  The patient's score is  based upon: CHF History: 1 HTN History: 1 Diabetes History: 1 Stroke History: 0 Vascular Disease History: 0 Age Score: 0 Gender Score: 0        ASSESSMENT AND PLAN: Persistent Atrial Fibrillation (ICD10:  I48.19) The patient's CHA2DS2-VASc score is 3, indicating a 3.2% annual risk of stroke.   S/p afib ablation 01/20/22 S/p dofetilide admission 4/16-4/19/24 Patient in SR today, afib burden low with dofetilide. He is happy with his present therapy. Continue dofetilide 500 mcg BID, QT stable Check bmet/mag today Continue warfarin Continue diltiazem 240 mg daily Continue Toprol 100 mg daily Kardia mobile for home monitoring   Secondary Hypercoagulable State (ICD10:  D68.69) The patient is at significant risk for stroke/thromboembolism based upon his CHA2DS2-VASc Score of 3.  Continue Warfarin (Coumadin).   Obesity Body mass index is 42.07 kg/m.  Encouraged lifestyle modification  OSA  Encouraged nightly CPAP Followed by Dr Tresa Endo  HTN Elevated today, we discussed a low sodium diet and using his CPAP consistently. He will keep a BP log to review with his PCP next week.   Systolic dysfunction EF 40-45% on TEE at time of ablation. Repeat echo showed EF 60-65% Fluid status appears stable   Follow up in the AF clinic in 6 months.    Jorja Loa PA-C Afib Clinic [facility] [address] [place], [place] [address] [phone] 02/02/2023 3:55 PM

## 2023-02-03 ENCOUNTER — Other Ambulatory Visit: Payer: Self-pay | Admitting: Family Medicine

## 2023-02-03 ENCOUNTER — Ambulatory Visit (INDEPENDENT_AMBULATORY_CARE_PROVIDER_SITE_OTHER): Payer: 59 | Admitting: *Deleted

## 2023-02-03 DIAGNOSIS — I480 Paroxysmal atrial fibrillation: Secondary | ICD-10-CM

## 2023-02-03 DIAGNOSIS — Z5181 Encounter for therapeutic drug level monitoring: Secondary | ICD-10-CM

## 2023-02-03 DIAGNOSIS — N522 Drug-induced erectile dysfunction: Secondary | ICD-10-CM

## 2023-02-03 LAB — POCT INR: INR: 2.5 (ref 2.0–3.0)

## 2023-02-03 NOTE — Patient Instructions (Addendum)
Description   Call wife at [phone]; Spoke with pt's wife on DPR, advised to have pt to continue taking Warfarin 1 tablet daily except 1/2 tablet on Wednesdays. Recheck INR in 2 weeks.   [facility]in [facility] [phone] *Please discuss DOAC option with spouse during next INR check - note on file to discuss after insurance change*  Holding off for now.

## 2023-02-08 ENCOUNTER — Other Ambulatory Visit: Payer: Self-pay | Admitting: Family Medicine

## 2023-02-08 ENCOUNTER — Other Ambulatory Visit (HOSPITAL_COMMUNITY): Payer: Self-pay | Admitting: Physician Assistant

## 2023-02-08 DIAGNOSIS — E119 Type 2 diabetes mellitus without complications: Secondary | ICD-10-CM

## 2023-02-09 ENCOUNTER — Encounter: Payer: Self-pay | Admitting: Family Medicine

## 2023-02-09 ENCOUNTER — Ambulatory Visit (INDEPENDENT_AMBULATORY_CARE_PROVIDER_SITE_OTHER): Payer: 59 | Admitting: Family Medicine

## 2023-02-09 VITALS — BP 126/78 | HR 104 | Temp 97.8°F | Resp 18 | Ht 71.0 in | Wt 266.5 lb

## 2023-02-09 DIAGNOSIS — Z0000 Encounter for general adult medical examination without abnormal findings: Secondary | ICD-10-CM | POA: Diagnosis not present

## 2023-02-09 DIAGNOSIS — Z23 Encounter for immunization: Secondary | ICD-10-CM

## 2023-02-09 DIAGNOSIS — Z7984 Long term (current) use of oral hypoglycemic drugs: Secondary | ICD-10-CM

## 2023-02-09 DIAGNOSIS — E119 Type 2 diabetes mellitus without complications: Secondary | ICD-10-CM

## 2023-02-09 DIAGNOSIS — Z125 Encounter for screening for malignant neoplasm of prostate: Secondary | ICD-10-CM | POA: Diagnosis not present

## 2023-02-09 DIAGNOSIS — Z7985 Long-term (current) use of injectable non-insulin antidiabetic drugs: Secondary | ICD-10-CM

## 2023-02-09 LAB — CBC WITH DIFFERENTIAL/PLATELET
Basophils Absolute: 0.1 10*3/uL (ref 0.0–0.1)
Basophils Relative: 0.8 % (ref 0.0–3.0)
Eosinophils Absolute: 0.3 10*3/uL (ref 0.0–0.7)
Eosinophils Relative: 3.1 % (ref 0.0–5.0)
HCT: 44 % (ref 39.0–52.0)
Hemoglobin: 14.5 g/dL (ref 13.0–17.0)
Lymphocytes Relative: 27.7 % (ref 12.0–46.0)
Lymphs Abs: 2.7 10*3/uL (ref 0.7–4.0)
MCHC: 33 g/dL (ref 30.0–36.0)
MCV: 84.3 fl (ref 78.0–100.0)
Monocytes Absolute: 0.8 10*3/uL (ref 0.1–1.0)
Monocytes Relative: 7.7 % (ref 3.0–12.0)
Neutro Abs: 6 10*3/uL (ref 1.4–7.7)
Neutrophils Relative %: 60.7 % (ref 43.0–77.0)
Platelets: 225 10*3/uL (ref 150.0–400.0)
RBC: 5.22 Mil/uL (ref 4.22–5.81)
RDW: 14 % (ref 11.5–15.5)
WBC: 9.8 10*3/uL (ref 4.0–10.5)

## 2023-02-09 LAB — LIPID PANEL
Cholesterol: 130 mg/dL (ref 0–200)
HDL: 38.8 mg/dL — ABNORMAL LOW (ref >39.00)
LDL Cholesterol: 57 mg/dL (ref 0–99)
NonHDL: 91.35
Total CHOL/HDL Ratio: 3
Triglycerides: 171 mg/dL — ABNORMAL HIGH (ref 0.0–149.0)
VLDL: 34.2 mg/dL (ref 0.0–40.0)

## 2023-02-09 LAB — HEPATIC FUNCTION PANEL
ALT: 24 U/L (ref 0–53)
AST: 17 U/L (ref 0–37)
Albumin: 4 g/dL (ref 3.5–5.2)
Alkaline Phosphatase: 95 U/L (ref 39–117)
Bilirubin, Direct: 0.1 mg/dL (ref 0.0–0.3)
Total Bilirubin: 0.6 mg/dL (ref 0.2–1.2)
Total Protein: 6.4 g/dL (ref 6.0–8.3)

## 2023-02-09 LAB — HEMOGLOBIN A1C: Hgb A1c MFr Bld: 7 % — ABNORMAL HIGH (ref 4.6–6.5)

## 2023-02-09 LAB — BASIC METABOLIC PANEL
BUN: 17 mg/dL (ref 6–23)
CO2: 26 meq/L (ref 19–32)
Calcium: 9.2 mg/dL (ref 8.4–10.5)
Chloride: 105 meq/L (ref 96–112)
Creatinine, Ser: 0.82 mg/dL (ref 0.40–1.50)
GFR: 96.18 mL/min (ref >60.00)
Glucose, Bld: 75 mg/dL (ref 70–99)
Potassium: 4.2 meq/L (ref 3.5–5.1)
Sodium: 141 meq/L (ref 135–145)

## 2023-02-09 LAB — TSH: TSH: 0.91 u[IU]/mL (ref 0.35–5.50)

## 2023-02-09 LAB — PSA: PSA: 0.89 ng/mL (ref 0.10–4.00)

## 2023-02-09 MED ORDER — TRULICITY 0.75 MG/0.5ML ~~LOC~~ SOAJ
SUBCUTANEOUS | 1 refills | Status: DC
Start: 2023-02-09 — End: 2023-06-11

## 2023-02-09 NOTE — Patient Instructions (Signed)
Follow up in 3-4 months to recheck diabetes We'll notify you of your lab results and make any changes if needed Continue to work on low carb, low sugar diet and get regular exercise- you can do it! Call with any questions or concerns Stay Safe!!  Stay Healthy! Happy Fall!!

## 2023-02-09 NOTE — Assessment & Plan Note (Signed)
Chronic problem.  UTD on eye exam, microalbumin.  Foot exam done today.  Check labs.  Adjust meds prn

## 2023-02-09 NOTE — Progress Notes (Signed)
Subjective:    Patient ID: Daniel Grant, male    DOB: Nov 02, 1965, 57 y.o.   MRN: [id number]  HPI CPE- UTD on colonoscopy, eye exam, microalbumin, Tdap.  Due for flu and foot exam.  Patient Care Team    Relationship Specialty Notifications Start End  Sheliah Hatch, MD PCP - General Family Medicine  11/28/15   Lanier Prude, MD PCP - Electrophysiology Cardiology  03/29/21   Lewayne Bunting, MD PCP - Cardiology Cardiology  02/03/22   Herby Abraham, MD  Cardiology  04/25/11   Karie Soda, MD Consulting Physician General Surgery  04/21/16   Lewayne Bunting, MD Consulting Physician Cardiology  04/21/16   Dr Dione Booze Referring Physician Ophthalmology  10/08/22      Health Maintenance  Topic Date Due   INFLUENZA VACCINE  12/25/2022   FOOT EXAM  01/23/2023   HEMOGLOBIN A1C  04/10/2023   Diabetic kidney evaluation - Urine ACR  05/01/2023   OPHTHALMOLOGY EXAM  10/28/2023   Diabetic kidney evaluation - eGFR measurement  02/02/2024   Colonoscopy  05/05/2024   Hepatitis C Screening  Completed   HIV Screening  Completed   HPV VACCINES  Aged Out   DTaP/Tdap/Td  Discontinued   COVID-19 Vaccine  Discontinued   Zoster Vaccines- Shingrix  Discontinued      Review of Systems Patient reports no vision/hearing changes, anorexia, fever ,adenopathy, persistant/recurrent hoarseness, swallowing issues, chest pain, edema, persistant/recurrent cough, hemoptysis, dyspnea (rest,exertional, paroxysmal nocturnal), gastrointestinal  bleeding (melena, rectal bleeding), abdominal pain, excessive heart burn, GU symptoms (dysuria, hematuria, voiding/incontinence issues) syncope, focal weakness, memory loss, numbness & tingling, skin/hair/nail changes, depression, anxiety, abnormal bruising/bleeding, musculoskeletal symptoms/signs.     Objective:   Physical Exam General Appearance:    Alert, cooperative, no distress, appears stated age, obese  Head:    Normocephalic, without obvious abnormality,  atraumatic  Eyes:    PERRL, conjunctiva/corneas clear, EOM's intact both eyes       Ears:    Normal TM's and external ear canals, both ears  Nose:   Nares normal, septum midline, mucosa normal, no drainage   or sinus tenderness  Throat:   Lips, mucosa, and tongue normal; teeth and gums normal  Neck:   Supple, symmetrical, trachea midline, no adenopathy;       thyroid:  No enlargement/tenderness/nodules  Back:     Symmetric, no curvature, ROM normal, no CVA tenderness  Lungs:     Clear to auscultation bilaterally, respirations unlabored  Chest wall:    No tenderness or deformity  Heart:    Irregular rhythm, S1 and S2 normal, no murmur, rub   or gallop  Abdomen:     Soft, non-tender, bowel sounds active all four quadrants,    no masses, no organomegaly  Genitalia:    deferred  Rectal:    Extremities:   Extremities normal, atraumatic, no cyanosis or edema  Pulses:   2+ and symmetric all extremities  Skin:   Skin color, texture, turgor normal, no rashes or lesions  Lymph nodes:   Cervical, supraclavicular, and axillary nodes normal  Neurologic:   CNII-XII intact. Normal strength, sensation and reflexes      throughout          Assessment & Plan:

## 2023-02-09 NOTE — Assessment & Plan Note (Signed)
Pt's PE WNL w/ exception of obesity and known Afib.  UTD on colonoscopy, eye exam, microalbumin, Tdap.  Flu shot given.  Check labs.  Anticipatory guidance provided.

## 2023-02-10 ENCOUNTER — Telehealth: Payer: Self-pay

## 2023-02-10 NOTE — Telephone Encounter (Signed)
Left vm to call office about labs.

## 2023-02-10 NOTE — Telephone Encounter (Signed)
-----   Message from Neena Rhymes sent at 02/10/2023  7:19 AM EDT ----- Labs are stable and look good.  Continue to work on healthy diet and regular exercise.  No med changes at this time

## 2023-02-11 NOTE — Telephone Encounter (Signed)
Pt has been notified.

## 2023-02-17 ENCOUNTER — Ambulatory Visit (INDEPENDENT_AMBULATORY_CARE_PROVIDER_SITE_OTHER): Payer: 59 | Admitting: Cardiovascular Disease

## 2023-02-17 DIAGNOSIS — Z5181 Encounter for therapeutic drug level monitoring: Secondary | ICD-10-CM

## 2023-02-17 DIAGNOSIS — I480 Paroxysmal atrial fibrillation: Secondary | ICD-10-CM

## 2023-02-17 LAB — POCT INR: INR: 5.2 — AB (ref 2.0–3.0)

## 2023-02-24 ENCOUNTER — Ambulatory Visit (INDEPENDENT_AMBULATORY_CARE_PROVIDER_SITE_OTHER): Payer: 59 | Admitting: Cardiology

## 2023-02-24 DIAGNOSIS — Z5181 Encounter for therapeutic drug level monitoring: Secondary | ICD-10-CM

## 2023-02-24 DIAGNOSIS — I480 Paroxysmal atrial fibrillation: Secondary | ICD-10-CM | POA: Diagnosis not present

## 2023-02-24 LAB — POCT INR: INR: 3.8 — AB (ref 2.0–3.0)

## 2023-03-03 ENCOUNTER — Ambulatory Visit (INDEPENDENT_AMBULATORY_CARE_PROVIDER_SITE_OTHER): Payer: 59

## 2023-03-03 DIAGNOSIS — I480 Paroxysmal atrial fibrillation: Secondary | ICD-10-CM | POA: Diagnosis not present

## 2023-03-03 DIAGNOSIS — Z5181 Encounter for therapeutic drug level monitoring: Secondary | ICD-10-CM | POA: Diagnosis not present

## 2023-03-03 LAB — POCT INR: INR: 2.6 (ref 2.0–3.0)

## 2023-03-03 NOTE — Patient Instructions (Signed)
Description   Call wife at [phone]; Spoke with pt's wife on DPR, advised to have pt continue taking Warfarin 1 tablet daily except 1/2 tablet on Wednesdays.  Recheck INR in 2 weeks.   [facility] [phone] *Please discuss DOAC option with spouse during next INR check - note on file to discuss after insurance change*  Holding off for now.

## 2023-03-17 ENCOUNTER — Ambulatory Visit (INDEPENDENT_AMBULATORY_CARE_PROVIDER_SITE_OTHER): Payer: 59 | Admitting: *Deleted

## 2023-03-17 DIAGNOSIS — Z5181 Encounter for therapeutic drug level monitoring: Secondary | ICD-10-CM | POA: Diagnosis not present

## 2023-03-17 DIAGNOSIS — I480 Paroxysmal atrial fibrillation: Secondary | ICD-10-CM | POA: Diagnosis not present

## 2023-03-17 LAB — POCT INR: INR: 3.3 — AB (ref 2.0–3.0)

## 2023-03-17 NOTE — Patient Instructions (Signed)
Description   Call wife at [phone]; Spoke with pt's wife on DPR, advised to have pt not take today's dose of warfarin then continue taking Warfarin 1 tablet daily except 1/2 tablet on Wednesdays.  Recheck INR in 2 weeks.   [facility] [phone] *Please discuss DOAC option with spouse during next INR check - note on file to discuss after insurance change*  Holding off for now.

## 2023-03-29 ENCOUNTER — Other Ambulatory Visit: Payer: Self-pay | Admitting: Physician Assistant

## 2023-03-31 ENCOUNTER — Ambulatory Visit (INDEPENDENT_AMBULATORY_CARE_PROVIDER_SITE_OTHER): Payer: 59 | Admitting: *Deleted

## 2023-03-31 DIAGNOSIS — I480 Paroxysmal atrial fibrillation: Secondary | ICD-10-CM

## 2023-03-31 DIAGNOSIS — Z5181 Encounter for therapeutic drug level monitoring: Secondary | ICD-10-CM | POA: Diagnosis not present

## 2023-03-31 LAB — POCT INR: INR: 4 — AB (ref 2.0–3.0)

## 2023-03-31 NOTE — Patient Instructions (Addendum)
Description   Call wife at [phone]; Spoke with pt's wife on DPR, advised to have pt not take today's dose of warfarin then START taking Warfarin 1 tablet daily except 1/2 tablet on Sundays and Wednesdays. Recheck INR in 1 week (normally 2 weeks).   [facility] [phone] *Please discuss DOAC option with spouse during next INR check - note on file to discuss after insurance change*  Holding off for now.

## 2023-04-06 ENCOUNTER — Other Ambulatory Visit (HOSPITAL_COMMUNITY): Payer: Self-pay

## 2023-04-06 MED ORDER — COVID-19 MRNA VAC-TRIS(PFIZER) 30 MCG/0.3ML IM SUSY
0.3000 mL | PREFILLED_SYRINGE | Freq: Once | INTRAMUSCULAR | 0 refills | Status: AC
  Filled 2023-04-06: qty 0.3, 1d supply, fill #0

## 2023-04-07 ENCOUNTER — Ambulatory Visit (INDEPENDENT_AMBULATORY_CARE_PROVIDER_SITE_OTHER): Payer: 59 | Admitting: *Deleted

## 2023-04-07 DIAGNOSIS — I480 Paroxysmal atrial fibrillation: Secondary | ICD-10-CM | POA: Diagnosis not present

## 2023-04-07 DIAGNOSIS — Z5181 Encounter for therapeutic drug level monitoring: Secondary | ICD-10-CM | POA: Diagnosis not present

## 2023-04-07 LAB — POCT INR: INR: 2.6 (ref 2.0–3.0)

## 2023-04-07 NOTE — Patient Instructions (Signed)
Description   Call wife at [phone]; Spoke with pt's wife on DPR, advised to have pt not take today's dose of warfarin then START taking Warfarin 1 tablet daily except 1/2 tablet on Sundays and Wednesdays. Recheck INR in 2 weeks.    [facility] [phone] *Please discuss DOAC option with spouse during next INR check - note on file to discuss after insurance change*  Holding off for now.

## 2023-04-13 ENCOUNTER — Ambulatory Visit (HOSPITAL_BASED_OUTPATIENT_CLINIC_OR_DEPARTMENT_OTHER): Payer: 59 | Attending: Cardiovascular Disease | Admitting: Cardiovascular Disease

## 2023-04-16 ENCOUNTER — Ambulatory Visit (HOSPITAL_BASED_OUTPATIENT_CLINIC_OR_DEPARTMENT_OTHER): Payer: 59 | Attending: Cardiovascular Disease | Admitting: Cardiovascular Disease

## 2023-04-16 DIAGNOSIS — G4733 Obstructive sleep apnea (adult) (pediatric): Secondary | ICD-10-CM | POA: Diagnosis present

## 2023-04-16 DIAGNOSIS — I4891 Unspecified atrial fibrillation: Secondary | ICD-10-CM | POA: Diagnosis not present

## 2023-04-19 ENCOUNTER — Other Ambulatory Visit (HOSPITAL_COMMUNITY): Payer: Self-pay | Admitting: Physician Assistant

## 2023-04-19 ENCOUNTER — Other Ambulatory Visit: Payer: Self-pay | Admitting: Cardiology

## 2023-04-19 DIAGNOSIS — I480 Paroxysmal atrial fibrillation: Secondary | ICD-10-CM

## 2023-04-21 ENCOUNTER — Ambulatory Visit (INDEPENDENT_AMBULATORY_CARE_PROVIDER_SITE_OTHER): Payer: 59

## 2023-04-21 DIAGNOSIS — Z5181 Encounter for therapeutic drug level monitoring: Secondary | ICD-10-CM

## 2023-04-21 LAB — POCT INR: INR: 2 (ref 2.0–3.0)

## 2023-04-21 NOTE — Patient Instructions (Signed)
Description   Call wife at [phone]; Spoke with pt's wife on DPR, advised for pt to continue taking Warfarin 1 tablet daily except 1/2 tablet on Sundays and Wednesdays.  Recheck INR in 2 weeks.   [facility] [phone] *Please discuss DOAC option with spouse during next INR check - note on file to discuss after insurance change*  Holding off for now.

## 2023-04-28 ENCOUNTER — Encounter (HOSPITAL_BASED_OUTPATIENT_CLINIC_OR_DEPARTMENT_OTHER): Payer: Self-pay | Admitting: Cardiovascular Disease

## 2023-04-28 NOTE — Procedures (Signed)
     Patient Name: Daniel Grant, Daniel Grant Date: 04/17/2023 Gender: Male D.O.B: May 12, 1966 Age (years): 57 Referring Provider: Olga Millers Height (inches): 67 Interpreting Physician: Nicki Guadalajara MD, ABSM Weight (lbs): 268 RPSGT: [place] BMI: 42 MRN: [id number] Neck Size: <br>  CLINICAL INFORMATION Sleep Study Type: HST  Indication for sleep study: OSA, CPAP since 2009, and last device 2018; Atrial fibrillation  Epworth Sleepiness Score: 10  SLEEP STUDY TECHNIQUE A multi-channel overnight portable sleep study was performed. The channels recorded were: nasal airflow, thoracic respiratory movement, and oxygen saturation with a pulse oximetry. Snoring was also monitored.  MEDICATIONS benazepril (LOTENSIN) 40 MG tablet cetirizine (ZYRTEC) 10 MG tablet diltiazem (CARDIZEM CD) 240 MG 24 hr capsule diltiazem (CARDIZEM) 30 MG tablet dofetilide (TIKOSYN) 500 MCG capsule Dulaglutide (TRULICITY) 0.75 MG/0.5ML SOPN fluticasone (FLONASE) 50 MCG/ACT nasal spray MAGnesium-Oxide 400 (240 Mg) MG tablet metFORMIN (GLUCOPHAGE-XR) 500 MG 24 hr tablet metoprolol succinate (TOPROL-XL) 100 MG 24 hr tablet Multiple Vitamin (MULTIVITAMIN) tablet Omega-3 Fatty Acids (FISH OIL PO) pravastatin (PRAVACHOL) 40 MG tablet Probiotic Product (PROBIOTIC PO) sildenafil (VIAGRA) 100 MG tablet warfarin (COUMADIN) 10 MG tablet Patient self administered medications include: N/A.  SLEEP ARCHITECTURE Patient was studied for 384 minutes. The sleep efficiency was 100.0 % and the patient was supine for 0%. The arousal index was 0.0 per hour.  RESPIRATORY PARAMETERS The overall AHI was 23.8 per hour, with a central apnea index of 0 per hour. Sleep Apnea was severe with supine sleep (AHI44.1/h).  The oxygen nadir was 89% during sleep.  CARDIAC DATA Mean heart rate during sleep was 92.6 bpm.  IMPRESSIONS - Moderate obstructive sleep apnea overall (AHI 23.8/h) with severe sleep apnea with supine  sleep (AHI 44.1/h). - Mild oxygen desaturation was noted during this study to a nadir of 89%. - Patient snored 1% during the sleep.  DIAGNOSIS - Obstructive Sleep Apnea (G47.33)  RECOMMENDATIONS - Therapeutic CPAP with new ResMed 11 Autoset device with EPR of 3 at 8 - 20 cm of water.  - Effort should be made to optimize nasal and oropharyngeal patency. - Positional therapy avoiding supine position during sleep. - Avoid alcohol, sedatives and other CNS depressants that may worsen sleep apnea and disrupt normal sleep architecture. - Sleep hygiene should be reviewed to assess factors that may improve sleep quality. - Weight management and regular exercise should be initiated or continued. - Recommend a download and sleep clinic evaluation after 6 - 8 weeks of therapy.  [Electronically signed] 04/28/2023 04:19 PM  Nicki Guadalajara MD, Donneta Romberg Diplomate, American Board of Sleep Medicine NPI: [id number]  [facility] PH: [phone]   FX: [phone] ACCREDITED BY THE AMERICAN ACADEMY OF SLEEP MEDICINE

## 2023-04-29 ENCOUNTER — Telehealth: Payer: Self-pay

## 2023-04-29 NOTE — Telephone Encounter (Signed)
-----   Message from Nicki Guadalajara sent at 04/28/2023  4:26 PM EST ----- Eliseo Gum, please notify pt and set up with new ResMed 11Autoset

## 2023-04-29 NOTE — Addendum Note (Signed)
Addended by: Brunetta Genera on: 04/29/2023 09:56 AM   Modules accepted: Orders

## 2023-04-29 NOTE — Telephone Encounter (Signed)
Notified patient, via VM, per DPR. that Sleep study has been signed and order for new CPAP machine has been sent to [facility] 04/29/23

## 2023-05-05 ENCOUNTER — Telehealth: Payer: Self-pay | Admitting: *Deleted

## 2023-05-05 ENCOUNTER — Ambulatory Visit (INDEPENDENT_AMBULATORY_CARE_PROVIDER_SITE_OTHER): Payer: 59 | Admitting: Cardiovascular Disease

## 2023-05-05 ENCOUNTER — Encounter: Payer: Self-pay | Admitting: Cardiology

## 2023-05-05 DIAGNOSIS — I480 Paroxysmal atrial fibrillation: Secondary | ICD-10-CM | POA: Diagnosis not present

## 2023-05-05 DIAGNOSIS — Z5181 Encounter for therapeutic drug level monitoring: Secondary | ICD-10-CM

## 2023-05-05 LAB — POCT INR: INR: 2.3 (ref 2.0–3.0)

## 2023-05-05 NOTE — Telephone Encounter (Signed)
Called pt since INR is due; there was no answer so left a message to call back.

## 2023-05-11 ENCOUNTER — Ambulatory Visit: Payer: 59 | Admitting: Family Medicine

## 2023-05-18 ENCOUNTER — Ambulatory Visit (INDEPENDENT_AMBULATORY_CARE_PROVIDER_SITE_OTHER): Payer: 59 | Admitting: *Deleted

## 2023-05-18 DIAGNOSIS — I480 Paroxysmal atrial fibrillation: Secondary | ICD-10-CM

## 2023-05-18 DIAGNOSIS — Z5181 Encounter for therapeutic drug level monitoring: Secondary | ICD-10-CM

## 2023-05-18 LAB — POCT INR: INR: 2.5 (ref 2.0–3.0)

## 2023-05-18 NOTE — Patient Instructions (Signed)
 Description   Call wife at [phone]; Spoke with pt's wife on DPR, advised for pt to continue taking Warfarin 1 tablet daily except 1/2 tablet on Sundays and Wednesdays.  Recheck INR in 2 weeks.   [facility] [phone] *Please discuss DOAC option with spouse during next INR check - note on file to discuss after insurance change*  Holding off for now.

## 2023-06-02 ENCOUNTER — Ambulatory Visit (INDEPENDENT_AMBULATORY_CARE_PROVIDER_SITE_OTHER): Payer: 59 | Admitting: Internal Medicine

## 2023-06-02 ENCOUNTER — Telehealth: Payer: Self-pay | Admitting: *Deleted

## 2023-06-02 DIAGNOSIS — I480 Paroxysmal atrial fibrillation: Secondary | ICD-10-CM

## 2023-06-02 DIAGNOSIS — Z5181 Encounter for therapeutic drug level monitoring: Secondary | ICD-10-CM | POA: Diagnosis not present

## 2023-06-02 LAB — POCT INR: INR: 1.8 — AB (ref 2.0–3.0)

## 2023-06-02 NOTE — Telephone Encounter (Signed)
 Called pt since INR is due, there was no answer so left a message. Will follow up

## 2023-06-09 ENCOUNTER — Encounter: Payer: Self-pay | Admitting: Cardiology

## 2023-06-11 ENCOUNTER — Encounter: Payer: Self-pay | Admitting: Family Medicine

## 2023-06-11 ENCOUNTER — Ambulatory Visit: Payer: 59 | Admitting: Family Medicine

## 2023-06-11 VITALS — BP 142/92 | HR 63 | Temp 98.4°F | Ht 67.0 in | Wt 268.4 lb

## 2023-06-11 DIAGNOSIS — I10 Essential (primary) hypertension: Secondary | ICD-10-CM

## 2023-06-11 DIAGNOSIS — E119 Type 2 diabetes mellitus without complications: Secondary | ICD-10-CM

## 2023-06-11 DIAGNOSIS — Z7984 Long term (current) use of oral hypoglycemic drugs: Secondary | ICD-10-CM | POA: Diagnosis not present

## 2023-06-11 DIAGNOSIS — Z7985 Long-term (current) use of injectable non-insulin antidiabetic drugs: Secondary | ICD-10-CM | POA: Diagnosis not present

## 2023-06-11 LAB — BASIC METABOLIC PANEL
BUN: 24 mg/dL — ABNORMAL HIGH (ref 6–23)
CO2: 24 meq/L (ref 19–32)
Calcium: 9.5 mg/dL (ref 8.4–10.5)
Chloride: 107 meq/L (ref 96–112)
Creatinine, Ser: 0.96 mg/dL (ref 0.40–1.50)
GFR: 86.34 mL/min (ref >60.00)
Glucose, Bld: 106 mg/dL — ABNORMAL HIGH (ref 70–99)
Potassium: 4.2 meq/L (ref 3.5–5.1)
Sodium: 140 meq/L (ref 135–145)

## 2023-06-11 LAB — MICROALBUMIN / CREATININE URINE RATIO
Creatinine,U: 248.5 mg/dL
Microalb Creat Ratio: 4.9 mg/g (ref 0.0–30.0)
Microalb, Ur: 12.3 mg/dL — ABNORMAL HIGH (ref 0.0–1.9)

## 2023-06-11 LAB — HEMOGLOBIN A1C: Hgb A1c MFr Bld: 7 % — ABNORMAL HIGH (ref 4.6–6.5)

## 2023-06-11 MED ORDER — AMLODIPINE BESY-BENAZEPRIL HCL 5-40 MG PO CAPS: 1.0000 | ORAL_CAPSULE | Freq: Every day | ORAL | 3 refills | Status: DC

## 2023-06-11 MED ORDER — TRULICITY 0.75 MG/0.5ML ~~LOC~~ SOAJ
SUBCUTANEOUS | 1 refills | Status: DC
Start: 2023-06-11 — End: 2024-02-08

## 2023-06-11 NOTE — Assessment & Plan Note (Signed)
Chronic problem.  Currently on Trulicity 0.75mg  weekly and Metformin XR.  UTD on eye exam, foot exam.  Microalbumin ordered.  Check labs.  Adjust meds prn

## 2023-06-11 NOTE — Assessment & Plan Note (Signed)
Deteriorated.  Pt's BP has been in the 160-170s range and he had a severe HA on Tuesday.  He really doesn't want to take another pill as he feels he takes too many already.  Will combine the Benazepril and Amlodipine 5mg  daily so as not to increase his pill burden.  Check labs due to ACE use.  Will follow.

## 2023-06-11 NOTE — Patient Instructions (Signed)
Follow up in 1 month to recheck blood pressure We'll notify you of your lab results and make any changes if needed SWITCH the plain Benazepril to the Benazepril/Amlodipine combo med once daily Call with any questions or concerns Stay Safe!  Stay Healthy! Hang in there!!

## 2023-06-11 NOTE — Progress Notes (Signed)
   Subjective:    Patient ID: Daniel Grant, male    DOB: 10/23/1965, 58 y.o.   MRN: [id number]  HPI DM- chronic problem, on Trulicity 0.75mg  weekly and Metformin XR 1000mg  QAM and 500mg  QPM.  UTD on eye exam, foot exam.  Due for microalbumin.  Denies symptomatic lows.  No numbness/tingling of hands/feet.  HTN- chronic problem, on Benazepril 40mg , Cardizem 240mg  daily, Metoprolol 100mg  daily.  Pt reports BP has been elevated.  Had severe HA on Tuesday and stayed home from work.  At that point, BP was 170/96.  Yesterday was 160/89.  No CP, SOB, visual changes, edema.   Review of Systems For ROS see HPI     Objective:   Physical Exam Vitals reviewed.  Constitutional:      General: He is not in acute distress.    Appearance: Normal appearance. He is well-developed. He is obese. He is not ill-appearing.  HENT:     Head: Normocephalic and atraumatic.  Eyes:     Extraocular Movements: Extraocular movements intact.     Conjunctiva/sclera: Conjunctivae normal.     Pupils: Pupils are equal, round, and reactive to light.  Neck:     Thyroid: No thyromegaly.  Cardiovascular:     Rate and Rhythm: Normal rate and regular rhythm.     Pulses: Normal pulses.     Heart sounds: Normal heart sounds. No murmur heard. Pulmonary:     Effort: Pulmonary effort is normal. No respiratory distress.     Breath sounds: Normal breath sounds.  Abdominal:     General: Bowel sounds are normal. There is no distension.     Palpations: Abdomen is soft.  Musculoskeletal:     Cervical back: Normal range of motion and neck supple.     Right lower leg: No edema.     Left lower leg: No edema.  Lymphadenopathy:     Cervical: No cervical adenopathy.  Skin:    General: Skin is warm and dry.  Neurological:     General: No focal deficit present.     Mental Status: He is alert and oriented to person, place, and time.     Cranial Nerves: No cranial nerve deficit.  Psychiatric:        Mood and Affect: Mood normal.         Behavior: Behavior normal.           Assessment & Plan:

## 2023-06-12 ENCOUNTER — Telehealth: Payer: Self-pay

## 2023-06-12 NOTE — Telephone Encounter (Signed)
-----   Message from Neena Rhymes sent at 06/12/2023  7:36 AM EST ----- Labs are stable and look good.  No changes at this time

## 2023-06-12 NOTE — Telephone Encounter (Signed)
Pt has been called and informed no questions

## 2023-06-16 ENCOUNTER — Telehealth: Payer: Self-pay | Admitting: *Deleted

## 2023-06-16 ENCOUNTER — Ambulatory Visit (INDEPENDENT_AMBULATORY_CARE_PROVIDER_SITE_OTHER): Payer: 59 | Admitting: Cardiology

## 2023-06-16 DIAGNOSIS — Z5181 Encounter for therapeutic drug level monitoring: Secondary | ICD-10-CM | POA: Diagnosis not present

## 2023-06-16 DIAGNOSIS — I480 Paroxysmal atrial fibrillation: Secondary | ICD-10-CM | POA: Diagnosis not present

## 2023-06-16 LAB — POCT INR: INR: 4 — AB (ref 2.0–3.0)

## 2023-06-16 NOTE — Telephone Encounter (Signed)
Called pt since INR is due; there was no answer so left a message for pt/wife to call back regarding this.

## 2023-06-26 ENCOUNTER — Other Ambulatory Visit: Payer: Self-pay | Admitting: Cardiology

## 2023-06-30 ENCOUNTER — Ambulatory Visit (INDEPENDENT_AMBULATORY_CARE_PROVIDER_SITE_OTHER): Payer: 59 | Admitting: *Deleted

## 2023-06-30 DIAGNOSIS — Z5181 Encounter for therapeutic drug level monitoring: Secondary | ICD-10-CM | POA: Diagnosis not present

## 2023-06-30 DIAGNOSIS — I480 Paroxysmal atrial fibrillation: Secondary | ICD-10-CM | POA: Diagnosis not present

## 2023-06-30 LAB — POCT INR: INR: 6.4 — AB (ref 2.0–3.0)

## 2023-06-30 NOTE — Patient Instructions (Addendum)
 Description   Call wife at [phone]; Spoke with pt's wife on DPR, advised for pt to HOLD warfarin today, hold warfarin tomorrow, take 1/2 tablet of warfarin Thursday then START only then continue taking Warfarin 1 tablet daily except 1/2 tablet on Sundays, Wednesdays, and Fridays.  Recheck INR in 1 week (2 weeks)-wife aware if pt not eating leafy veggies may need to reduce again on this next check.  Coumadin  Clinic [phone] *Please discuss DOAC option with spouse during next INR check - note on file to discuss after insurance change*  Holding off for now.

## 2023-07-05 ENCOUNTER — Other Ambulatory Visit: Payer: Self-pay | Admitting: Physician Assistant

## 2023-07-05 DIAGNOSIS — I480 Paroxysmal atrial fibrillation: Secondary | ICD-10-CM

## 2023-07-06 ENCOUNTER — Ambulatory Visit: Payer: 59 | Attending: Cardiology | Admitting: Cardiology

## 2023-07-06 VITALS — BP 120/68 | HR 71 | Ht 70.5 in | Wt 265.0 lb

## 2023-07-06 DIAGNOSIS — I4819 Other persistent atrial fibrillation: Secondary | ICD-10-CM

## 2023-07-06 DIAGNOSIS — I480 Paroxysmal atrial fibrillation: Secondary | ICD-10-CM | POA: Diagnosis not present

## 2023-07-06 DIAGNOSIS — I10 Essential (primary) hypertension: Secondary | ICD-10-CM | POA: Diagnosis not present

## 2023-07-06 DIAGNOSIS — G4733 Obstructive sleep apnea (adult) (pediatric): Secondary | ICD-10-CM

## 2023-07-06 DIAGNOSIS — D6869 Other thrombophilia: Secondary | ICD-10-CM

## 2023-07-06 NOTE — Patient Instructions (Signed)
 Medication Instructions:  Your physician recommends that you continue on your current medications as directed. Please refer to the Current Medication list given to you today.  *If you need a refill on your cardiac medications before your next appointment, please call your pharmacy*   Lab Work: BMP, Mg  If you have labs (blood work) drawn today and your tests are completely normal, you will receive your results only by: MyChart Message (if you have MyChart) OR A paper copy in the mail If you have any lab test that is abnormal or we need to change your treatment, we will call you to review the results.   Testing/Procedures: NONE   Follow-Up: At [facility], you and your health needs are our priority.  As part of our continuing mission to provide you with exceptional heart care, we have created designated Provider Care Teams.  These Care Teams include your primary Cardiologist (physician) and Advanced Practice Providers (APPs -  Physician Assistants and Nurse Practitioners) who all work together to provide you with the care you need, when you need it.  We recommend signing up for the patient portal called "MyChart".  Sign up information is provided on this After Visit Summary.  MyChart is used to connect with patients for Virtual Visits (Telemedicine).  Patients are able to view lab/test results, encounter notes, upcoming appointments, etc.  Non-urgent messages can be sent to your provider as well.   To learn more about what you can do with MyChart, go to [email or web address].    Your next appointment:   4-6 month(s)  Provider:   Alexandria Angel, MD

## 2023-07-06 NOTE — Progress Notes (Signed)
 Cardiology Office Note:    Date:  07/06/2023   ID:  KEVINN TSENG, DOB [birth year], MRN [id number]  PCP:  Jess Morita, MD   [facility] Providers Cardiologist:  Alexandria Angel, MD Electrophysiologist:  Boyce Byes, MD {  Referring MD: Jess Morita, MD   Chief Complaint  Patient presents with   Follow-up   History of Present Illness:    Daniel Grant is a 58 y.o. male with a hx of DM, HTN, HLD, OSA, atrial fibrillation s/p AF ablation with recurrence (on Tikosyn ), and non-ischemic cardiomyopathy with normalization who presents for follow up.   Mr. Chinen has a long hx of complicated AF with multiple DCCVs and AF ablation 12/2021 with AF recurrence. He was placed on Tikosyn  beginning 08/2022 and has been followed closely with the AF clinic. He was last seen 01/2023 and was noted to be in NSR on Tikosyn  500mcg BID with stable QT. He was continued on Coumadin  and diltiazem  as well. He follows his INR with a home device and calls results to our Coumadin  clinic. His wife is an Charity fundraiser.   Today he is here alone and reports he has been doing really well since he was last seen. He follows his HR's on home kardia device which will sometimes note that he is in AF with rates in the low 100's. He continues to be asymptomatic. He denies chest pain, SOB, palpitations, LE edema, orthopnea, PND, dizziness, or syncope. Denies bleeding in stool or urine.    Past Medical History:  Diagnosis Date   DM II (diabetes mellitus, type II), controlled (HCC)    Erectile dysfunction    Hyperlipidemia    Hypertension    Obesity    OSA (obstructive sleep apnea)    Paroxysmal atrial fibrillation (HCC)    Past Surgical History:  Procedure Laterality Date   ATRIAL FIBRILLATION ABLATION N/A 01/02/2022   Procedure: ATRIAL FIBRILLATION ABLATION;  Surgeon: Boyce Byes, MD;  Location: [facility] INVASIVE CV LAB;  Service: Cardiovascular;  Laterality: N/A;   CARDIAC CATHETERIZATION      CARDIOVERSION N/A 09/10/2020   Procedure: CARDIOVERSION;  Surgeon: Lenise Quince, MD;  Location: [facility] ENDOSCOPY;  Service: Cardiovascular;  Laterality: N/A;   CARDIOVERSION N/A 07/04/2022   Procedure: CARDIOVERSION;  Surgeon: Sheryle Donning, MD;  Location: [facility] ENDOSCOPY;  Service: Cardiovascular;  Laterality: N/A;   FINGER SURGERY     left 1st finger   TEE WITHOUT CARDIOVERSION  01/02/2022   Procedure: TRANSESOPHAGEAL ECHOCARDIOGRAM (TEE);  Surgeon: Boyce Byes, MD;  Location: [facility] INVASIVE CV LAB;  Service: Cardiovascular;;    Current Medications: Current Meds  Medication Sig   amLODipine -benazepril  (LOTREL) 5-40 MG capsule Take 1 capsule by mouth daily.   cetirizine (ZYRTEC) 10 MG tablet Take 10 mg by mouth daily.   diltiazem  (CARDIZEM  CD) 240 MG 24 hr capsule TAKE 1 CAPSULE BY MOUTH DAILY   diltiazem  (CARDIZEM ) 30 MG tablet TAKE 1 TABLET BY MOUTH EVERY 4 HOURS AS NEEDED FOR AFIB HEART RATE GREATER THAN 100 AS LONG AS TOP BP GREATER THAN 100   dofetilide  (TIKOSYN ) 500 MCG capsule TAKE 1 CAPSULE BY MOUTH TWICE  DAILY   Dulaglutide  (TRULICITY ) 0.75 MG/0.5ML SOAJ INJECT 0.75 UNDER THE SKIN ONCE WEEKLY   fluticasone  (FLONASE ) 50 MCG/ACT nasal spray Place 2 sprays into both nostrils daily. (Patient taking differently: Place 2 sprays into both nostrils daily as needed for allergies.)   MAGnesium -Oxide 400 (240 Mg) MG tablet Take 1 tablet by mouth  once daily   metFORMIN  (GLUCOPHAGE -XR) 500 MG 24 hr tablet TAKE 2 TABLETS BY MOUTH BEFORE  BREAKFAST AND 1 TABLET BY MOUTH  WITH SUPPER   metoprolol  succinate (TOPROL -XL) 100 MG 24 hr tablet TAKE 1 TABLET BY MOUTH DAILY  WITH OR IMMEDIATELY FOLLOWING A  MEAL   Multiple Vitamin (MULTIVITAMIN) tablet Take 1 tablet by mouth daily.   Omega-3 Fatty Acids (FISH OIL  PO) Take 2 capsules by mouth 2 (two) times daily.   pravastatin  (PRAVACHOL ) 40 MG tablet TAKE 1 TABLET BY MOUTH IN THE  EVENING   Probiotic Product (PROBIOTIC PO) Take 1 capsule by mouth  daily.   sildenafil  (VIAGRA ) 100 MG tablet TAKE 1 TABLET BY MOUTH ONCE DAILY AS NEEDED FOR ERECTILE DYSFUNCTION   warfarin (COUMADIN ) 10 MG tablet TAKE 1/2 TO 1 TABLET BY MOUTH  DAILY AS DIRECTED BY COUMADIN   CLINIC     Allergies:   Patient has no known allergies.   Social History   Socioeconomic History   Marital status: Married    Spouse name: Not on file   Number of children: Not on file   Years of education: Not on file   Highest education level: Not on file  Occupational History   Not on file  Tobacco Use   Smoking status: Former    Current packs/day: 0.00    Types: Cigarettes    Quit date: 05/26/1994    Years since quitting: 29.1   Smokeless tobacco: Never   Tobacco comments:    Former smoker  quit in 1995 01/30/22  Vaping Use   Vaping status: Never Used  Substance and Sexual Activity   Alcohol use: Not Currently    Alcohol/week: 5.0 standard drinks of alcohol    Types: 5 Cans of beer per week   Drug use: No   Sexual activity: Yes  Other Topics Concern   Not on file  Social History Narrative   Not on file   Social Drivers of Health   Financial Resource Strain: Not on file  Food Insecurity: No Food Insecurity (09/11/2022)   Hunger Vital Sign    Worried About Running Out of Food in the Last Year: Never true    Ran Out of Food in the Last Year: Never true  Transportation Needs: No Transportation Needs (09/11/2022)   PRAPARE - Administrator, Civil Service (Medical): No    Lack of Transportation (Non-Medical): No  Physical Activity: Not on file  Stress: Not on file  Social Connections: Unknown (10/03/2021)   Received from [facility]   Social Network    Social Network: Not on file    Family History: The patient's family history includes Alcohol abuse in his father; Diabetes in his mother; Heart attack in his maternal grandmother and paternal uncle; Heart disease in his mother; Hypertension in his mother. There is no history of Cancer,  Early death, Hyperlipidemia, Kidney disease, or Stroke.  ROS:   Please see the history of present illness.     All other systems reviewed and are negative.  EKGs/Labs/Other Studies Reviewed:    The following studies were reviewed today:  EKG Interpretation Date/Time:  Monday July 06 2023 15:23:46 EST Ventricular Rate:  112 PR Interval:    QRS Duration:  88 QT Interval:  342 QTC Calculation: 466 R Axis:   44  Text Interpretation: Atrial fibrillation with rapid ventricular response T wave abnormality, consider inferior ischemia When compared with ECG of 02-Feb-2023 15:19, Atrial fibrillation has replaced Sinus  rhythm Vent. rate has increased BY  55 BPM T wave inversion more evident in Inferior leads Nonspecific T wave abnormality now evident in Anterolateral leads Confirmed by Drema Genta ([id number]) on 07/06/2023 5:38:18 PM    Recent Labs: 02/02/2023: Magnesium  1.8 02/09/2023: ALT 24; Hemoglobin 14.5; Platelets 225.0; TSH 0.91 06/11/2023: BUN 24; Creatinine, Ser 0.96; Potassium 4.2; Sodium 140  Recent Lipid Panel    Component Value Date/Time   CHOL 130 02/09/2023 1327   CHOL 128 07/27/2020 1015   TRIG 171.0 (H) 02/09/2023 1327   HDL 38.80 (L) 02/09/2023 1327   HDL 33 (L) 07/27/2020 1015   CHOLHDL 3 02/09/2023 1327   VLDL 34.2 02/09/2023 1327   LDLCALC 57 02/09/2023 1327   LDLCALC 64 06/07/2021 1426   LDLDIRECT 80.0 12/15/2018 1359   Risk Assessment/Calculations:    CHA2DS2-VASc Score = 3  The patient's score is based upon: CHF History: 1 HTN History: 1 Diabetes History: 1 Stroke History: 0 Vascular Disease History: 0 Age Score: 0 Gender Score: 0     Physical Exam:    VS:  BP 120/68 (BP Location: Right Arm)   Pulse 71   Ht 5' 10.5" (1.791 m)   Wt 265 lb (120.2 kg)   SpO2 97%   BMI 37.49 kg/m     Wt Readings from Last 3 Encounters:  07/06/23 265 lb (120.2 kg)  06/11/23 268 lb 6 oz (121.7 kg)  04/13/23 268 lb (121.6 kg)    General: Well developed, well  nourished, NAD Lungs:Clear to ausculation bilaterally. No wheezes, rales, or rhonchi. Breathing is unlabored. Cardiovascular: Irregularly irregular. No murmurs Extremities: No edema.  Neuro: Alert and oriented. No focal deficits. No facial asymmetry. MAE spontaneously. Psych: Responds to questions appropriately with normal affect.    ASSESSMENT/PLAN:    Persistent atrial fibrillaiton: s/p AF ablation 12/2021 with AF recurrence who is now treated with Tikosyn  500mcg BID, diltiazem  240mg  daily, Toprol  100mg  daily and Coumadin . EKG today with AF with HR at 112bpm. Reports home Kardia with intermittent tachycardia/AF. Previously deferred repeat ablation. Obtain BMET and Mg+ today. Discussed plan with AF Clinic. If AF is persistent on home monitoring, plan to keep AF clinic follow up 3/17. If paroxsymal and labs are okay, will push OV out further.   OSA on CPAP: Follows with Dr. Loetta Ringer. Reports CPAP compliance.   Obesity: BMI 37. Planning to concentrate on weight loss strategies.   HTN: Very well controlled today at 120/68. No changes needed at this time.   Cardiomyopathy: Previously noted with an LV function of 45% at the time of AF ablation with normalization to 60-65% on subsequent echocardiogram. Appears euvolemic today.    Medication Adjustments/Labs and Tests Ordered: Current medicines are reviewed at length with the patient today.  Concerns regarding medicines are outlined above.  Orders Placed This Encounter  Procedures   Basic metabolic panel   Magnesium    EKG 12-Lead   No orders of the defined types were placed in this encounter.   Patient Instructions  Medication Instructions:  Your physician recommends that you continue on your current medications as directed. Please refer to the Current Medication list given to you today.  *If you need a refill on your cardiac medications before your next appointment, please call your pharmacy*   Lab Work: BMP, Mg  If you have labs  (blood work) drawn today and your tests are completely normal, you will receive your results only by: MyChart Message (if you have MyChart) OR A paper copy in the  mail If you have any lab test that is abnormal or we need to change your treatment, we will call you to review the results.   Testing/Procedures: NONE   Follow-Up: At [facility], you and your health needs are our priority.  As part of our continuing mission to provide you with exceptional heart care, we have created designated Provider Care Teams.  These Care Teams include your primary Cardiologist (physician) and Advanced Practice Providers (APPs -  Physician Assistants and Nurse Practitioners) who all work together to provide you with the care you need, when you need it.  We recommend signing up for the patient portal called "MyChart".  Sign up information is provided on this After Visit Summary.  MyChart is used to connect with patients for Virtual Visits (Telemedicine).  Patients are able to view lab/test results, encounter notes, upcoming appointments, etc.  Non-urgent messages can be sent to your provider as well.   To learn more about what you can do with MyChart, go to [email or web address].    Your next appointment:   4-6 month(s)  Provider:   Alexandria Angel, MD           Signed, Drema Genta, NP  07/06/2023 5:41 PM    [facility]

## 2023-07-07 ENCOUNTER — Ambulatory Visit (INDEPENDENT_AMBULATORY_CARE_PROVIDER_SITE_OTHER): Payer: 59 | Admitting: Cardiovascular Disease

## 2023-07-07 DIAGNOSIS — Z5181 Encounter for therapeutic drug level monitoring: Secondary | ICD-10-CM

## 2023-07-07 DIAGNOSIS — I480 Paroxysmal atrial fibrillation: Secondary | ICD-10-CM

## 2023-07-07 LAB — BASIC METABOLIC PANEL
BUN/Creatinine Ratio: 24 — ABNORMAL HIGH (ref 9–20)
BUN: 22 mg/dL (ref 6–24)
CO2: 22 mmol/L (ref 20–29)
Calcium: 9.5 mg/dL (ref 8.7–10.2)
Chloride: 103 mmol/L (ref 96–106)
Creatinine, Ser: 0.91 mg/dL (ref 0.76–1.27)
Glucose: 153 mg/dL — ABNORMAL HIGH (ref 70–99)
Potassium: 4 mmol/L (ref 3.5–5.2)
Sodium: 141 mmol/L (ref 134–144)
eGFR: 97 mL/min/{1.73_m2} (ref >59)

## 2023-07-07 LAB — POCT INR: INR: 3.7 — AB (ref 2.0–3.0)

## 2023-07-07 LAB — MAGNESIUM: Magnesium: 1.8 mg/dL (ref 1.6–2.3)

## 2023-07-07 NOTE — Patient Instructions (Signed)
Description   Call wife at [phone]; Spoke with pt's wife on DPR, advised for pt to HOLD warfarin dose today then continue taking Warfarin 1 tablet daily except 1/2 tablet on Sundays, Wednesdays, and Fridays.  Recheck INR in 1 week (2 weeks)-wife aware if pt not eating leafy veggies may need to reduce again on this next check.  Coumadin Clinic [phone] *Please discuss DOAC option with spouse during next INR check - note on file to discuss after insurance change*  Holding off for now.

## 2023-07-10 ENCOUNTER — Ambulatory Visit: Payer: 59 | Admitting: Family Medicine

## 2023-07-10 ENCOUNTER — Encounter: Payer: Self-pay | Admitting: Family Medicine

## 2023-07-10 VITALS — BP 110/72 | HR 59 | Temp 98.9°F | Ht 70.5 in | Wt 268.1 lb

## 2023-07-10 DIAGNOSIS — I10 Essential (primary) hypertension: Secondary | ICD-10-CM | POA: Diagnosis not present

## 2023-07-10 MED ORDER — AMLODIPINE BESY-BENAZEPRIL HCL 5-40 MG PO CAPS: 1.0000 | ORAL_CAPSULE | Freq: Every day | ORAL | 1 refills | Status: DC

## 2023-07-10 NOTE — Progress Notes (Signed)
   Subjective:    Patient ID: Daniel Grant, male    DOB: Oct 03, 1965, 58 y.o.   MRN: [id number]  HPI HTN- chronic problem.  Last BP 142/92.  At last visit we added Amlodipine 5mg  daily.  Now on Amlodipine Benazepril 5/40mg  daily and his Dilt 240mg  daily.  BP is excellent today.  No CP, SOB, HA's, visual changes, edema.     Review of Systems For ROS see HPI     Objective:   Physical Exam Vitals reviewed.  Constitutional:      General: He is not in acute distress.    Appearance: Normal appearance. He is well-developed. He is obese. He is not ill-appearing.  HENT:     Head: Normocephalic and atraumatic.  Eyes:     Extraocular Movements: Extraocular movements intact.     Conjunctiva/sclera: Conjunctivae normal.     Pupils: Pupils are equal, round, and reactive to light.  Neck:     Thyroid: No thyromegaly.  Cardiovascular:     Rate and Rhythm: Normal rate and regular rhythm.     Pulses: Normal pulses.     Heart sounds: Normal heart sounds. No murmur heard. Pulmonary:     Effort: Pulmonary effort is normal. No respiratory distress.     Breath sounds: Normal breath sounds.  Abdominal:     General: Bowel sounds are normal. There is no distension.     Palpations: Abdomen is soft.  Musculoskeletal:     Cervical back: Normal range of motion and neck supple.     Right lower leg: No edema.     Left lower leg: No edema.  Lymphadenopathy:     Cervical: No cervical adenopathy.  Skin:    General: Skin is warm and dry.  Neurological:     General: No focal deficit present.     Mental Status: He is alert and oriented to person, place, and time.     Cranial Nerves: No cranial nerve deficit.  Psychiatric:        Mood and Affect: Mood normal.        Behavior: Behavior normal.           Assessment & Plan:

## 2023-07-10 NOTE — Assessment & Plan Note (Signed)
BP now excellently controlled w/ addition of Amlodipine 5mg  daily.  Currently asymptomatic.  No med changes at this time.  Continue Dilt 240mg  daily, Amlodipine Benazepril 5/40mg  daily.

## 2023-07-10 NOTE — Patient Instructions (Signed)
Follow up in 3 months to recheck sugar, BP, cholesterol No med changes at this time Keep up the good work!  You look great! Call with any questions or concerns Stay safe!  Stay healthy!

## 2023-07-14 ENCOUNTER — Ambulatory Visit (INDEPENDENT_AMBULATORY_CARE_PROVIDER_SITE_OTHER): Payer: 59 | Admitting: Cardiovascular Disease

## 2023-07-14 DIAGNOSIS — Z5181 Encounter for therapeutic drug level monitoring: Secondary | ICD-10-CM

## 2023-07-14 DIAGNOSIS — I480 Paroxysmal atrial fibrillation: Secondary | ICD-10-CM | POA: Diagnosis not present

## 2023-07-14 LAB — POCT INR: INR: 3.2 — AB (ref 2.0–3.0)

## 2023-07-20 ENCOUNTER — Other Ambulatory Visit: Payer: Self-pay | Admitting: Family Medicine

## 2023-07-20 DIAGNOSIS — N522 Drug-induced erectile dysfunction: Secondary | ICD-10-CM

## 2023-07-21 ENCOUNTER — Ambulatory Visit (INDEPENDENT_AMBULATORY_CARE_PROVIDER_SITE_OTHER): Payer: 59 | Admitting: Cardiology

## 2023-07-21 DIAGNOSIS — I480 Paroxysmal atrial fibrillation: Secondary | ICD-10-CM

## 2023-07-21 DIAGNOSIS — Z5181 Encounter for therapeutic drug level monitoring: Secondary | ICD-10-CM

## 2023-07-21 LAB — POCT INR: INR: 3.6 — AB (ref 2.0–3.0)

## 2023-07-28 ENCOUNTER — Telehealth: Payer: Self-pay

## 2023-07-28 ENCOUNTER — Ambulatory Visit (INDEPENDENT_AMBULATORY_CARE_PROVIDER_SITE_OTHER): Admitting: Cardiovascular Disease

## 2023-07-28 DIAGNOSIS — Z5181 Encounter for therapeutic drug level monitoring: Secondary | ICD-10-CM

## 2023-07-28 DIAGNOSIS — I480 Paroxysmal atrial fibrillation: Secondary | ICD-10-CM | POA: Diagnosis not present

## 2023-07-28 LAB — POCT INR: INR: 2.4 (ref 2.0–3.0)

## 2023-07-28 NOTE — Telephone Encounter (Signed)
 Lpmtcb with INR results.

## 2023-08-10 ENCOUNTER — Encounter (HOSPITAL_COMMUNITY): Payer: Self-pay | Admitting: Physician Assistant

## 2023-08-10 ENCOUNTER — Ambulatory Visit (HOSPITAL_COMMUNITY)
Admission: RE | Admit: 2023-08-10 | Discharge: 2023-08-10 | Disposition: A | Discharge status: Home / Self Care | Payer: 59 | Source: Ambulatory Visit | Attending: Physician Assistant | Admitting: Physician Assistant

## 2023-08-10 VITALS — BP 140/80 | HR 63 | Ht 70.5 in | Wt 266.2 lb

## 2023-08-10 DIAGNOSIS — I5022 Chronic systolic (congestive) heart failure: Secondary | ICD-10-CM | POA: Diagnosis not present

## 2023-08-10 DIAGNOSIS — Z7901 Long term (current) use of anticoagulants: Secondary | ICD-10-CM | POA: Insufficient documentation

## 2023-08-10 DIAGNOSIS — E669 Obesity, unspecified: Secondary | ICD-10-CM | POA: Insufficient documentation

## 2023-08-10 DIAGNOSIS — Z6837 Body mass index (BMI) 37.0-37.9, adult: Secondary | ICD-10-CM | POA: Diagnosis not present

## 2023-08-10 DIAGNOSIS — I4819 Other persistent atrial fibrillation: Secondary | ICD-10-CM | POA: Diagnosis present

## 2023-08-10 DIAGNOSIS — D6869 Other thrombophilia: Secondary | ICD-10-CM | POA: Diagnosis not present

## 2023-08-10 DIAGNOSIS — I110 Hypertensive heart disease with heart failure: Secondary | ICD-10-CM | POA: Insufficient documentation

## 2023-08-10 DIAGNOSIS — G4733 Obstructive sleep apnea (adult) (pediatric): Secondary | ICD-10-CM | POA: Insufficient documentation

## 2023-08-10 DIAGNOSIS — Z5181 Encounter for therapeutic drug level monitoring: Secondary | ICD-10-CM | POA: Insufficient documentation

## 2023-08-10 DIAGNOSIS — Z79899 Other long term (current) drug therapy: Secondary | ICD-10-CM | POA: Insufficient documentation

## 2023-08-10 NOTE — Progress Notes (Signed)
 Primary Care Physician: Sheliah Hatch, MD Primary Cardiologist: Dr Jens Som Primary Electrophysiologist: Dr Lalla Brothers Referring Physician: Dr Barbarann Ehlers is a 58 y.o. male with a history of DM, HTN, HLD, OSA, atrial fibrillation who presents for follow up in the [facility]rial [facility]. Patient is on warfarin for a CHADS2VASC score of 3. Patient is s/p afib ablation with Dr Lalla Brothers on 01/02/22. TEE at the time of ablation showed decreased systolic function with EF 40-45%.  Patient was seen at the ED with neck pain and found to be back in rapid afib. Neck pain was felt to be trap spasm. He was discharged from the ED in afib. Patient has converted back to SR before his follow up on 01/30/22 but went out of rhythm again and is s/p DCCV 07/04/22. He has a Nutritional therapist which showed he was back out of rhythm within 24 hours.   Patient is s/p dofetilide loading 4/16-4/19/24. He converted with the medication and did not require DCCV.   Patient returns for follow up for atrial fibrillation and dofetilide monitoring. Patient reports that he has done well since his last visit. He will have instances of afib lasting just a few minutes. He was in afib at his visit on 2/10 but converted to SR soon after. No bleeding issues on anticoagulation.   Today, he denies symptoms of palpitations, chest pain, shortness of breath, orthopnea, PND, lower extremity edema, dizziness, presyncope, syncope, bleeding, or neurologic sequela. The patient is tolerating medications without difficulties and is otherwise without complaint today.    Atrial Fibrillation Risk Factors:  he does have symptoms or diagnosis of sleep apnea. he does not have a history of rheumatic fever.   Atrial Fibrillation Management history:  Previous antiarrhythmic drugs: dofetilide  Previous cardioversions: 2022, 07/04/22 Previous ablations: 01/02/22 Anticoagulation history: warfarin    Past Medical  History:  Diagnosis Date   DM II (diabetes mellitus, type II), controlled (HCC)    Erectile dysfunction    Hyperlipidemia    Hypertension    Obesity    OSA (obstructive sleep apnea)    Paroxysmal atrial fibrillation (HCC)     Current Outpatient Medications  Medication Sig Dispense Refill   amLODipine-benazepril (LOTREL) 5-40 MG capsule Take 1 capsule by mouth daily. 90 capsule 1   cetirizine (ZYRTEC) 10 MG tablet Take 10 mg by mouth daily.     diltiazem (CARDIZEM CD) 240 MG 24 hr capsule TAKE 1 CAPSULE BY MOUTH DAILY 90 capsule 3   diltiazem (CARDIZEM) 30 MG tablet TAKE 1 TABLET BY MOUTH EVERY 4 HOURS AS NEEDED FOR AFIB HEART RATE GREATER THAN 100 AS LONG AS TOP BP GREATER THAN 100 30 tablet 1   dofetilide (TIKOSYN) 500 MCG capsule TAKE 1 CAPSULE BY MOUTH TWICE  DAILY 180 capsule 3   Dulaglutide (TRULICITY) 0.75 MG/0.5ML SOAJ INJECT 0.75 UNDER THE SKIN ONCE WEEKLY 6 mL 1   fluticasone (FLONASE) 50 MCG/ACT nasal spray Place 2 sprays into both nostrils daily. (Patient taking differently: Place 2 sprays into both nostrils daily as needed for allergies.) 16 g 5   MAGnesium-Oxide 400 (240 Mg) MG tablet Take 1 tablet by mouth once daily 90 tablet 3   metFORMIN (GLUCOPHAGE-XR) 500 MG 24 hr tablet TAKE 2 TABLETS BY MOUTH BEFORE  BREAKFAST AND 1 TABLET BY MOUTH  WITH SUPPER 270 tablet 3   metoprolol succinate (TOPROL-XL) 100 MG 24 hr tablet TAKE 1 TABLET BY MOUTH DAILY  WITH OR IMMEDIATELY  FOLLOWING A  MEAL 90 tablet 3   Multiple Vitamin (MULTIVITAMIN) tablet Take 1 tablet by mouth daily.     Omega-3 Fatty Acids (FISH OIL PO) Take 2 capsules by mouth 2 (two) times daily.     pravastatin (PRAVACHOL) 40 MG tablet TAKE 1 TABLET BY MOUTH IN THE  EVENING 90 tablet 1   Probiotic Product (PROBIOTIC PO) Take 1 capsule by mouth daily.     sildenafil (VIAGRA) 100 MG tablet TAKE 1 TABLET BY MOUTH ONCE DAILY AS NEEDED FOR ERECTILE DYSFUNCTION 10 tablet 0   warfarin (COUMADIN) 10 MG tablet TAKE 1/2 TO 1 TABLET  BY MOUTH  DAILY AS DIRECTED BY COUMADIN  CLINIC 90 tablet 3   No current facility-administered medications for this encounter.    ROS- All systems are reviewed and negative except as per the HPI above.  Physical Exam: Vitals:   08/10/23 1526  BP: (!) 140/80  Pulse: 63  Weight: 120.7 kg  Height: 5' 10.5" (1.791 m)    GEN: Well nourished, well developed in no acute distress CARDIAC: Regular rate and rhythm, no murmurs, rubs, gallops RESPIRATORY:  Clear to auscultation without rales, wheezing or rhonchi  ABDOMEN: Soft, non-tender, non-distended EXTREMITIES:  No edema; No deformity    Wt Readings from Last 3 Encounters:  08/10/23 120.7 kg  07/10/23 121.6 kg  07/06/23 120.2 kg    EKG today demonstrates  SR Vent. rate 63 BPM PR interval 166 ms QRS duration 88 ms QT/QTcB 416/425 ms   Echo 04/08/22 demonstrated   1. Left ventricular ejection fraction, by estimation, is 60 to 65%. The  left ventricle has normal function. The left ventricle has no regional  wall motion abnormalities. There is mild concentric left ventricular  hypertrophy. Left ventricular diastolic parameters were normal.   2. Right ventricular systolic function is normal. The right ventricular  size is normal. There is normal pulmonary artery systolic pressure.   3. Left atrial size was moderately dilated.   4. No evidence of mitral valve regurgitation.   5. Aortic valve regurgitation is not visualized.   6. The inferior vena cava is normal in size with greater than 50%  respiratory variability, suggesting right atrial pressure of 3 mmHg.   Comparison(s): No significant change from prior study.   Epic records are reviewed at length today  CHA2DS2-VASc Score = 3  The patient's score is based upon: CHF History: 1 HTN History: 1 Diabetes History: 1 Stroke History: 0 Vascular Disease History: 0 Age Score: 0 Gender Score: 0       ASSESSMENT AND PLAN: Persistent Atrial Fibrillation (ICD10:   I48.19) The patient's CHA2DS2-VASc score is 3, indicating a 3.2% annual risk of stroke.   S/p afib ablation 12/2021 S/p dofetilide admission 08/2022 Low burden of afib on dofetilide.  Continue dofetilide 500 mcg BID Continue warfarin Continue diltiazem 240 mg daily with 30 mg PRN q 4 hours for heart racing.  Continue Toprol 100 mg daily Kardia mobile for home monitoring.   Secondary Hypercoagulable State (ICD10:  D68.69) The patient is at significant risk for stroke/thromboembolism based upon his CHA2DS2-VASc Score of 3.  Continue Warfarin (Coumadin). No bleeding issues.   High Risk Medication Monitoring (ICD 10: Z79.899) QT interval on ECG acceptable for dofetilide monitoring. Recent bmet/mag reviewed.   Obesity Body mass index is 37.66 kg/m.  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    Jorja Loa PA-C Afib Clinic [facility] [address] [place], [place] [address] [phone] 08/10/2023 3:44 PM

## 2023-08-11 ENCOUNTER — Ambulatory Visit (INDEPENDENT_AMBULATORY_CARE_PROVIDER_SITE_OTHER): Admitting: Internal Medicine

## 2023-08-11 DIAGNOSIS — Z5181 Encounter for therapeutic drug level monitoring: Secondary | ICD-10-CM

## 2023-08-11 DIAGNOSIS — I480 Paroxysmal atrial fibrillation: Secondary | ICD-10-CM | POA: Diagnosis not present

## 2023-08-11 LAB — POCT INR: INR: 2.2 (ref 2.0–3.0)

## 2023-08-25 ENCOUNTER — Ambulatory Visit (INDEPENDENT_AMBULATORY_CARE_PROVIDER_SITE_OTHER): Admitting: *Deleted

## 2023-08-25 DIAGNOSIS — I480 Paroxysmal atrial fibrillation: Secondary | ICD-10-CM | POA: Diagnosis not present

## 2023-08-25 DIAGNOSIS — Z5181 Encounter for therapeutic drug level monitoring: Secondary | ICD-10-CM | POA: Diagnosis not present

## 2023-08-25 LAB — POCT INR: INR: 2.5 (ref 2.0–3.0)

## 2023-08-25 NOTE — Patient Instructions (Signed)
 Description   Call wife at [phone]; Spoke with pt's wife on DPR, advised for pt to continue taking warfarin 1/2 tablet Daily, except 1 tablet on Mondays, Wednesdays, and Fridays.  Recheck INR in 2 weeks (2 weeks)  Coumadin Clinic [phone] *Please discuss DOAC option with spouse during next INR check - note on file to discuss after insurance change*  Holding off for now.

## 2023-09-08 ENCOUNTER — Ambulatory Visit (INDEPENDENT_AMBULATORY_CARE_PROVIDER_SITE_OTHER): Admitting: Cardiology

## 2023-09-08 DIAGNOSIS — Z5181 Encounter for therapeutic drug level monitoring: Secondary | ICD-10-CM

## 2023-09-08 DIAGNOSIS — I480 Paroxysmal atrial fibrillation: Secondary | ICD-10-CM | POA: Diagnosis not present

## 2023-09-08 LAB — POCT INR: INR: 3 (ref 2.0–3.0)

## 2023-09-22 ENCOUNTER — Ambulatory Visit (INDEPENDENT_AMBULATORY_CARE_PROVIDER_SITE_OTHER): Payer: Self-pay | Admitting: Internal Medicine

## 2023-09-22 DIAGNOSIS — I480 Paroxysmal atrial fibrillation: Secondary | ICD-10-CM

## 2023-09-22 DIAGNOSIS — Z5181 Encounter for therapeutic drug level monitoring: Secondary | ICD-10-CM

## 2023-09-22 LAB — POCT INR: INR: 4.4 — AB (ref 2.0–3.0)

## 2023-09-29 ENCOUNTER — Ambulatory Visit (INDEPENDENT_AMBULATORY_CARE_PROVIDER_SITE_OTHER): Payer: Self-pay | Admitting: Cardiology

## 2023-09-29 DIAGNOSIS — Z5181 Encounter for therapeutic drug level monitoring: Secondary | ICD-10-CM | POA: Diagnosis not present

## 2023-09-29 DIAGNOSIS — I480 Paroxysmal atrial fibrillation: Secondary | ICD-10-CM

## 2023-09-29 LAB — POCT INR: INR: 2.4 (ref 2.0–3.0)

## 2023-10-01 ENCOUNTER — Other Ambulatory Visit: Payer: Self-pay | Admitting: Family Medicine

## 2023-10-01 ENCOUNTER — Other Ambulatory Visit: Payer: Self-pay | Admitting: Cardiology

## 2023-10-01 DIAGNOSIS — I480 Paroxysmal atrial fibrillation: Secondary | ICD-10-CM

## 2023-10-13 ENCOUNTER — Ambulatory Visit (INDEPENDENT_AMBULATORY_CARE_PROVIDER_SITE_OTHER): Admitting: *Deleted

## 2023-10-13 DIAGNOSIS — Z5181 Encounter for therapeutic drug level monitoring: Secondary | ICD-10-CM

## 2023-10-13 DIAGNOSIS — I480 Paroxysmal atrial fibrillation: Secondary | ICD-10-CM

## 2023-10-13 LAB — POCT INR: INR: 2.3 (ref 2.0–3.0)

## 2023-10-14 ENCOUNTER — Telehealth: Payer: Self-pay

## 2023-10-14 NOTE — Telephone Encounter (Signed)
 Incoming call from Dionne Pedigo about Rodneys experience today on the phone, apparently Waddell had a phone call today where he was trying to get scheduled for an appointment to get refills, he states the phone room was already unpleasant but the receptionist (male) at our office was rude. Mrs. Bekker notes she is a [employer] employee and feels this was unacceptable from a patient standpoint. I did have to ask several time for Mr Leray to stop raising his voice in the background of my call with Mrs. Arave b/c of inability to understand what was being verbalized. Ms Prestwood then asked that the office manager call Manjot back tomorrow to discuss this and that she had no further comment.  Please call him at [phone]

## 2023-10-20 NOTE — Telephone Encounter (Signed)
 I spoke with patient today to gather further information surrounding the encounter he had with Antonio. Patient stated that he called and asked the call center to transfer him to the office because he wanted to speak with someone here. When he was transferred, he got Antonio and states that he was rude and should not be talking to people like that on the phone. Patient stated that [facility] told him "You should let the agent do their job, they could have assisted you." Patient states that [facility] was talking very fast and rushing the call and patient felt like he was trying to rush him off the phone and he should not be telling patients what they should and shouldn't do. Patient stated he didn't want to speak with the call center he wanted to speak to someone in the office but was very upset how he was spoken to. I apologized for this encounter and assured patient that I had spoken to [facility] myself since he is currently in our office and I have also made his direct supervisor aware of the situation. Patient stated that he hopes he's not here next month when he comes in for his appt because he would let him know to his face that he shouldn't be talking to patients like that. I did advise patient to not approach Antonio in that manner as that could escalate another situation. Patient stated, he would refrain if Antonio was here and thanked me for my call and thanked me for letting him know that we have done our part to address this.

## 2023-10-27 ENCOUNTER — Ambulatory Visit (INDEPENDENT_AMBULATORY_CARE_PROVIDER_SITE_OTHER): Payer: Self-pay | Admitting: Cardiology

## 2023-10-27 ENCOUNTER — Telehealth: Payer: Self-pay

## 2023-10-27 DIAGNOSIS — I480 Paroxysmal atrial fibrillation: Secondary | ICD-10-CM | POA: Diagnosis not present

## 2023-10-27 DIAGNOSIS — Z5181 Encounter for therapeutic drug level monitoring: Secondary | ICD-10-CM | POA: Diagnosis not present

## 2023-10-27 LAB — POCT INR: INR: 1.7 — AB (ref 2.0–3.0)

## 2023-10-27 NOTE — Telephone Encounter (Signed)
 Lm to check INR

## 2023-10-28 ENCOUNTER — Ambulatory Visit: Admitting: Family Medicine

## 2023-10-29 ENCOUNTER — Ambulatory Visit: Admitting: Family Medicine

## 2023-10-29 ENCOUNTER — Encounter: Payer: Self-pay | Admitting: Family Medicine

## 2023-10-29 VITALS — BP 110/70 | HR 58 | Temp 98.0°F | Ht 70.5 in | Wt 260.0 lb

## 2023-10-29 DIAGNOSIS — I10 Essential (primary) hypertension: Secondary | ICD-10-CM

## 2023-10-29 DIAGNOSIS — Z7984 Long term (current) use of oral hypoglycemic drugs: Secondary | ICD-10-CM

## 2023-10-29 DIAGNOSIS — Z7985 Long-term (current) use of injectable non-insulin antidiabetic drugs: Secondary | ICD-10-CM

## 2023-10-29 DIAGNOSIS — E785 Hyperlipidemia, unspecified: Secondary | ICD-10-CM | POA: Diagnosis not present

## 2023-10-29 DIAGNOSIS — E1169 Type 2 diabetes mellitus with other specified complication: Secondary | ICD-10-CM

## 2023-10-29 DIAGNOSIS — E119 Type 2 diabetes mellitus without complications: Secondary | ICD-10-CM

## 2023-10-29 LAB — CBC WITH DIFFERENTIAL/PLATELET
Basophils Absolute: 0.1 K/uL (ref 0.0–0.1)
Basophils Relative: 0.7 % (ref 0.0–3.0)
Eosinophils Absolute: 0.3 K/uL (ref 0.0–0.7)
Eosinophils Relative: 3.8 % (ref 0.0–5.0)
HCT: 43.3 % (ref 39.0–52.0)
Hemoglobin: 14.8 g/dL (ref 13.0–17.0)
Lymphocytes Relative: 29 % (ref 12.0–46.0)
Lymphs Abs: 2.5 K/uL (ref 0.7–4.0)
MCHC: 34.3 g/dL (ref 30.0–36.0)
MCV: 83.4 fl (ref 78.0–100.0)
Monocytes Absolute: 0.7 K/uL (ref 0.1–1.0)
Monocytes Relative: 7.7 % (ref 3.0–12.0)
Neutro Abs: 5.1 K/uL (ref 1.4–7.7)
Neutrophils Relative %: 58.8 % (ref 43.0–77.0)
Platelets: 229 K/uL (ref 150.0–400.0)
RBC: 5.19 Mil/uL (ref 4.22–5.81)
RDW: 13.5 % (ref 11.5–15.5)
WBC: 8.6 K/uL (ref 4.0–10.5)

## 2023-10-29 LAB — TSH: TSH: 0.78 u[IU]/mL (ref 0.35–5.50)

## 2023-10-29 LAB — HEMOGLOBIN A1C: Hgb A1c MFr Bld: 6.8 % — ABNORMAL HIGH (ref 4.6–6.5)

## 2023-10-29 NOTE — Progress Notes (Signed)
   Subjective:    Patient ID: CASMER YEPIZ, male    DOB: [birth year], 58 y.o.   MRN: [id number]  HPI DM- chronic problem, on Trulicity  0.75mg , Metformin  XR 1000mg  QAM and 500mg  at bedtime.  Down 8 lbs since Feb.  Eye exam scheduled.  UTD on foot exam, microalbumin.  No numbness/tingling of hands/feet.  HTN- chronic problem, on Metoprolol  XL 100mg  daily, Amlodipine  Benazepril  5/40mg  daily w/ good control.  No CP, SOB, HA's, visual changes, edema.  Hyperlipidemia- chronic problem, on Pravastatin  40mg  daily.  No abd pain, N/V.   Review of Systems For ROS see HPI     Objective:   Physical Exam Vitals reviewed.  Constitutional:      General: He is not in acute distress.    Appearance: Normal appearance. He is well-developed.  HENT:     Head: Normocephalic and atraumatic.  Eyes:     Extraocular Movements: Extraocular movements intact.     Conjunctiva/sclera: Conjunctivae normal.     Pupils: Pupils are equal, round, and reactive to light.  Neck:     Thyroid : No thyromegaly.  Cardiovascular:     Rate and Rhythm: Normal rate and regular rhythm.     Pulses: Normal pulses.     Heart sounds: Normal heart sounds. No murmur heard. Pulmonary:     Effort: Pulmonary effort is normal. No respiratory distress.     Breath sounds: Normal breath sounds.  Abdominal:     General: Bowel sounds are normal. There is no distension.     Palpations: Abdomen is soft.  Musculoskeletal:     Cervical back: Normal range of motion and neck supple.     Right lower leg: No edema.     Left lower leg: No edema.  Lymphadenopathy:     Cervical: No cervical adenopathy.  Skin:    General: Skin is warm and dry.  Neurological:     General: No focal deficit present.     Mental Status: He is alert and oriented to person, place, and time.     Cranial Nerves: No cranial nerve deficit.  Psychiatric:        Mood and Affect: Mood normal.        Behavior: Behavior normal.           Assessment & Plan:

## 2023-10-29 NOTE — Patient Instructions (Signed)
 Follow up in 3-4 months to recheck sugar We'll notify you of your lab results and make any changes if needed Continue to work on healthy diet and regular exercise- you can do it!!! Call with any questions or concerns Stay Safe!  Stay Healthy! Have a great summer!!!

## 2023-10-29 NOTE — Assessment & Plan Note (Signed)
 Chronic problem.  On Metoprolol , Amlodipine /Benazepril  w/ good control.  Currently asymptomatic.  Check labs due to ACE use but no anticipated med changes.  Will continue to follow.

## 2023-10-29 NOTE — Assessment & Plan Note (Signed)
 Chronic problem.  Currently on Pravastatin 40mg  daily w/o difficulty.  Check labs.  Adjust meds prn

## 2023-10-29 NOTE — Assessment & Plan Note (Signed)
 Chronic problem.  Currently on Trulicity  0.75mg  weekly, Metformin  XR.  Eye exam is scheduled.  UTD on foot exam, microalbumin.  Pt reports he has not been eating well and is fearful A1C is elevated.  Check labs.  Adjust meds prn

## 2023-10-30 LAB — BASIC METABOLIC PANEL WITH GFR
BUN: 19 mg/dL (ref 6–23)
CO2: 20 meq/L (ref 19–32)
Calcium: 9.3 mg/dL (ref 8.4–10.5)
Chloride: 105 meq/L (ref 96–112)
Creatinine, Ser: 0.84 mg/dL (ref 0.40–1.50)
GFR: 95 mL/min (ref >60.00)
Glucose, Bld: 122 mg/dL — ABNORMAL HIGH (ref 70–99)
Potassium: 4.5 meq/L (ref 3.5–5.1)
Sodium: 139 meq/L (ref 135–145)

## 2023-10-30 LAB — LIPID PANEL
Cholesterol: 139 mg/dL (ref 0–200)
HDL: 39.3 mg/dL (ref >39.00)
LDL Cholesterol: 79 mg/dL (ref 0–99)
NonHDL: 99.81
Total CHOL/HDL Ratio: 4
Triglycerides: 103 mg/dL (ref 0.0–149.0)
VLDL: 20.6 mg/dL (ref 0.0–40.0)

## 2023-10-30 LAB — HEPATIC FUNCTION PANEL
ALT: 27 U/L (ref 0–53)
AST: 18 U/L (ref 0–37)
Albumin: 4.2 g/dL (ref 3.5–5.2)
Alkaline Phosphatase: 97 U/L (ref 39–117)
Bilirubin, Direct: 0.1 mg/dL (ref 0.0–0.3)
Total Bilirubin: 0.5 mg/dL (ref 0.2–1.2)
Total Protein: 6.9 g/dL (ref 6.0–8.3)

## 2023-11-01 ENCOUNTER — Ambulatory Visit: Payer: Self-pay | Admitting: Family Medicine

## 2023-11-03 NOTE — Progress Notes (Signed)
 Left vm to call office about results

## 2023-11-10 ENCOUNTER — Telehealth: Payer: Self-pay

## 2023-11-10 LAB — POCT INR: INR: 1.9 — AB (ref 2.0–3.0)

## 2023-11-10 NOTE — Telephone Encounter (Signed)
 Reminded patient's wife to check INR.  Patient was not home yet to check.

## 2023-11-11 ENCOUNTER — Ambulatory Visit (INDEPENDENT_AMBULATORY_CARE_PROVIDER_SITE_OTHER): Admitting: Internal Medicine

## 2023-11-11 DIAGNOSIS — I480 Paroxysmal atrial fibrillation: Secondary | ICD-10-CM

## 2023-11-11 DIAGNOSIS — Z5181 Encounter for therapeutic drug level monitoring: Secondary | ICD-10-CM

## 2023-11-17 ENCOUNTER — Other Ambulatory Visit (HOSPITAL_COMMUNITY): Payer: Self-pay | Admitting: Physician Assistant

## 2023-11-23 NOTE — Progress Notes (Signed)
 HPI: FU atrial fibrillation. Cardiac catheterization in January of 2001 showed normal LV function and normal coronary arteries. Nuclear study in June of 2011 showed soft tissue attenuation and no ischemia. Patient underwent atrial fibrillation ablation August 2023.  TEE prior to ablation showed ejection fraction 40 to 45%, mild RV dysfunction, moderate left atrial enlargement, mild to moderate right atrial enlargement, mild mitral regurgitation.  Last echocardiogram November 2023 showed normal LV function, mild left ventricular hypertrophy, moderate left atrial enlargement.  Since last seen, patient denies dyspnea, chest pain, bleeding or syncope.  Occasional fluttering in his chest.  Current Outpatient Medications  Medication Sig Dispense Refill   amLODipine -benazepril  (LOTREL) 5-40 MG capsule Take 1 capsule by mouth daily. 90 capsule 1   cetirizine (ZYRTEC) 10 MG tablet Take 10 mg by mouth daily.     diltiazem  (CARDIZEM  CD) 240 MG 24 hr capsule TAKE 1 CAPSULE BY MOUTH DAILY 90 capsule 3   diltiazem  (CARDIZEM ) 30 MG tablet TAKE 1 TABLET BY MOUTH EVERY 4 HOURS AS NEEDED FOR AFIB HEART RATE GREATER THAN 100 AS LONG AS TOP BLOOD PRESSURE GREATER THAN 100 30 tablet 0   dofetilide  (TIKOSYN ) 500 MCG capsule TAKE 1 CAPSULE BY MOUTH TWICE  DAILY 180 capsule 3   Dulaglutide  (TRULICITY ) 0.75 MG/0.5ML SOAJ INJECT 0.75 UNDER THE SKIN ONCE WEEKLY 6 mL 1   fluticasone  (FLONASE ) 50 MCG/ACT nasal spray Place 2 sprays into both nostrils daily. 16 g 5   MAGnesium -Oxide 400 (240 Mg) MG tablet Take 1 tablet by mouth once daily 90 tablet 3   metFORMIN  (GLUCOPHAGE -XR) 500 MG 24 hr tablet TAKE 2 TABLETS BY MOUTH BEFORE  BREAKFAST AND 1 TABLET BY MOUTH  WITH SUPPER 270 tablet 3   metoprolol  succinate (TOPROL -XL) 100 MG 24 hr tablet Take 1 tablet (100 mg total) by mouth daily. WITH OR IMMEDIATELY FOLLOWING A MEAL 90 tablet 2   Multiple Vitamin (MULTIVITAMIN) tablet Take 1 tablet by mouth daily.     Omega-3 Fatty  Acids (FISH OIL  PO) Take 2 capsules by mouth 2 (two) times daily.     pravastatin  (PRAVACHOL ) 40 MG tablet TAKE 1 TABLET BY MOUTH IN THE  EVENING 90 tablet 1   Probiotic Product (PROBIOTIC PO) Take 1 capsule by mouth daily.     sildenafil  (VIAGRA ) 100 MG tablet TAKE 1 TABLET BY MOUTH ONCE DAILY AS NEEDED FOR ERECTILE DYSFUNCTION 10 tablet 0   warfarin (COUMADIN ) 10 MG tablet TAKE 1/2 TO 1 TABLET BY MOUTH  DAILY AS DIRECTED BY COUMADIN   CLINIC 90 tablet 3   No current facility-administered medications for this visit.     Past Medical History:  Diagnosis Date   DM II (diabetes mellitus, type II), controlled (HCC)    Erectile dysfunction    Hyperlipidemia    Hypertension    Obesity    OSA (obstructive sleep apnea)    Paroxysmal atrial fibrillation (HCC)     Past Surgical History:  Procedure Laterality Date   ATRIAL FIBRILLATION ABLATION N/A 01/02/2022   Procedure: ATRIAL FIBRILLATION ABLATION;  Surgeon: Cindie Ole DASEN, MD;  Location: [facility] INVASIVE CV LAB;  Service: Cardiovascular;  Laterality: N/A;   CARDIAC CATHETERIZATION     CARDIOVERSION N/A 09/10/2020   Procedure: CARDIOVERSION;  Surgeon: Pietro Redell RAMAN, MD;  Location: [facility] ENDOSCOPY;  Service: Cardiovascular;  Laterality: N/A;   CARDIOVERSION N/A 07/04/2022   Procedure: CARDIOVERSION;  Surgeon: Lonni Slain, MD;  Location: [facility] ENDOSCOPY;  Service: Cardiovascular;  Laterality: N/A;   FINGER SURGERY  left 1st finger   TEE WITHOUT CARDIOVERSION  01/02/2022   Procedure: TRANSESOPHAGEAL ECHOCARDIOGRAM (TEE);  Surgeon: Cindie Ole DASEN, MD;  Location: [facility] INVASIVE CV LAB;  Service: Cardiovascular;;    Social History   Socioeconomic History   Marital status: Married    Spouse name: Not on file   Number of children: Not on file   Years of education: Not on file   Highest education level: Not on file  Occupational History   Not on file  Tobacco Use   Smoking status: Former    Current packs/day: 0.00    Types:  Cigarettes    Quit date: 05/26/1994    Years since quitting: 29.5   Smokeless tobacco: Never   Tobacco comments:    Former smoker  quit in 1995 01/30/22  Vaping Use   Vaping status: Never Used  Substance and Sexual Activity   Alcohol use: Not Currently    Alcohol/week: 5.0 standard drinks of alcohol    Types: 5 Cans of beer per week   Drug use: No   Sexual activity: Yes  Other Topics Concern   Not on file  Social History Narrative   Not on file   Social Drivers of Health   Financial Resource Strain: Not on file  Food Insecurity: No Food Insecurity (09/11/2022)   Hunger Vital Sign    Worried About Running Out of Food in the Last Year: Never true    Ran Out of Food in the Last Year: Never true  Transportation Needs: No Transportation Needs (09/11/2022)   PRAPARE - Administrator, Civil Service (Medical): No    Lack of Transportation (Non-Medical): No  Physical Activity: Not on file  Stress: Not on file  Social Connections: Unknown (10/03/2021)   [facility] from [facility]   Social Network    Social Network: Not on file  Intimate Partner Violence: Not At Risk (09/11/2022)   Humiliation, Afraid, Rape, and Kick questionnaire    Fear of Current or Ex-Partner: No    Emotionally Abused: No    Physically Abused: No    Sexually Abused: No    Family History  Problem Relation Age of Onset   Heart attack Paternal Uncle    Heart attack Maternal Grandmother    Heart disease Mother    Hypertension Mother    Diabetes Mother    Alcohol abuse Father    Cancer Neg Hx    Early death Neg Hx    Hyperlipidemia Neg Hx    Kidney disease Neg Hx    Stroke Neg Hx     ROS: no fevers or chills, productive cough, hemoptysis, dysphasia, odynophagia, melena, hematochezia, dysuria, hematuria, rash, seizure activity, orthopnea, PND, pedal edema, claudication. Remaining systems are negative.  Physical Exam: Well-developed well-nourished in no acute distress.  Skin is warm and dry.   HEENT is normal.  Neck is supple.  Chest is clear to auscultation with normal expansion.  Cardiovascular exam is regular rate and rhythm.  Abdominal exam nontender or distended. No masses palpated. Extremities show no edema. neuro grossly intact  EKG Interpretation Date/Time:  Tuesday December 01 2023 15:56:57 EDT Ventricular Rate:  56 PR Interval:  154 QRS Duration:  94 QT Interval:  448 QTC Calculation: 432 R Axis:   50  Text Interpretation: Sinus bradycardia with Premature supraventricular complexes Confirmed by Pietro Rogue ([id number]) on 12/01/2023 4:04:54 PM    A/P  1 paroxysmal atrial fibrillation-patient remains in sinus rhythm today.  He is status post  ablation but then had recurrence.  He is now on Tikosyn .  Will continue.  Continue Cardizem , Toprol  and Coumadin  with goal INR 2-3.  I have provided the name of apixaban.  He will see if it is now affordable (he did not take this in the past due to expense).  2 hypertension-patient's blood pressure is controlled.  Will continue present medications and follow.  3 history of LV dysfunction-LV function has improved on most recent echocardiogram.  Will repeat study.  4 obstructive sleep apnea-continue CPAP.  5 hyperlipidemia-continue statin.  Redell Shallow, MD

## 2023-11-24 ENCOUNTER — Ambulatory Visit (INDEPENDENT_AMBULATORY_CARE_PROVIDER_SITE_OTHER): Admitting: Cardiology

## 2023-11-24 DIAGNOSIS — Z5181 Encounter for therapeutic drug level monitoring: Secondary | ICD-10-CM | POA: Diagnosis not present

## 2023-11-24 DIAGNOSIS — I480 Paroxysmal atrial fibrillation: Secondary | ICD-10-CM

## 2023-11-24 LAB — HM DIABETES EYE EXAM

## 2023-11-24 LAB — POCT INR: INR: 2.7 (ref 2.0–3.0)

## 2023-11-24 NOTE — Progress Notes (Signed)
Please see anticoagulation encounter.

## 2023-12-01 ENCOUNTER — Ambulatory Visit: Attending: Cardiology | Admitting: Cardiology

## 2023-12-01 ENCOUNTER — Encounter: Payer: Self-pay | Admitting: Cardiology

## 2023-12-01 VITALS — BP 120/70 | HR 56 | Ht 70.0 in | Wt 260.0 lb

## 2023-12-01 DIAGNOSIS — I420 Dilated cardiomyopathy: Secondary | ICD-10-CM | POA: Diagnosis not present

## 2023-12-01 DIAGNOSIS — I480 Paroxysmal atrial fibrillation: Secondary | ICD-10-CM | POA: Diagnosis not present

## 2023-12-01 DIAGNOSIS — I10 Essential (primary) hypertension: Secondary | ICD-10-CM

## 2023-12-01 DIAGNOSIS — G4733 Obstructive sleep apnea (adult) (pediatric): Secondary | ICD-10-CM

## 2023-12-01 NOTE — Patient Instructions (Addendum)
 Your physician has requested that you have an echocardiogram. Echocardiography is a painless test that uses sound waves to create images of your heart. It provides your doctor with information about the size and shape of your heart and how well your heart's chambers and valves are working. This procedure takes approximately one hour. There are no restrictions for this procedure. Please do NOT wear cologne, perfume, aftershave, or lotions (deodorant is allowed). Please arrive 15 minutes prior to your appointment time.  Please note: We ask at that you not bring children with you during ultrasound (echo/ vascular) testing. Due to room size and safety concerns, children are not allowed in the ultrasound rooms during exams. Our front office staff cannot provide observation of children in our lobby area while testing is being conducted. An adult accompanying a patient to their appointment will only be allowed in the ultrasound room at the discretion of the ultrasound technician under special circumstances. We apologize for any inconvenience. [address]     Follow-Up: At [facility], you and your health needs are our priority.  As part of our continuing mission to provide you with exceptional heart care, our providers are all part of one team.  This team includes your primary Cardiologist (physician) and Advanced Practice Providers or APPs (Physician Assistants and Nurse Practitioners) who all work together to provide you with the care you need, when you need it.  Your next appointment:   6 month(s)  Provider:   Redell Shallow, MD

## 2023-12-08 ENCOUNTER — Ambulatory Visit (INDEPENDENT_AMBULATORY_CARE_PROVIDER_SITE_OTHER): Admitting: Cardiology

## 2023-12-08 DIAGNOSIS — Z5181 Encounter for therapeutic drug level monitoring: Secondary | ICD-10-CM | POA: Diagnosis not present

## 2023-12-08 DIAGNOSIS — I480 Paroxysmal atrial fibrillation: Secondary | ICD-10-CM | POA: Diagnosis not present

## 2023-12-08 LAB — POCT INR: INR: 2.7 (ref 2.0–3.0)

## 2023-12-08 NOTE — Progress Notes (Signed)
Please see anticoagulation encounter.

## 2023-12-22 ENCOUNTER — Telehealth: Payer: Self-pay | Admitting: *Deleted

## 2023-12-22 LAB — POCT INR: INR: 2.9 (ref 2.0–3.0)

## 2023-12-22 NOTE — Telephone Encounter (Signed)
 Patient's INR is due today, wife states he is working later today and she will take care of it once he gets home later today. Will follow up tomorrow.

## 2023-12-23 ENCOUNTER — Ambulatory Visit (INDEPENDENT_AMBULATORY_CARE_PROVIDER_SITE_OTHER)

## 2023-12-23 DIAGNOSIS — Z5181 Encounter for therapeutic drug level monitoring: Secondary | ICD-10-CM | POA: Diagnosis not present

## 2023-12-23 NOTE — Patient Instructions (Signed)
 Description   Call wife at [phone]; Spoke with pt's wife on DPR, advised for pt to continue taking warfarin 1/2 tablet Daily, except 1 tablet on Mondays, Wednesdays, and Fridays.  Recheck INR in 2 weeks Coumadin  Clinic [phone] *Please discuss DOAC option with spouse during next INR check - note on file to discuss after insurance change*  Holding off for now.

## 2023-12-23 NOTE — Progress Notes (Signed)
 INR 2.9  Please see anticoagulation encounter

## 2023-12-24 ENCOUNTER — Other Ambulatory Visit: Payer: Self-pay | Admitting: Family Medicine

## 2023-12-24 ENCOUNTER — Other Ambulatory Visit (HOSPITAL_COMMUNITY): Payer: Self-pay | Admitting: Physician Assistant

## 2023-12-24 ENCOUNTER — Other Ambulatory Visit: Payer: Self-pay | Admitting: Cardiology

## 2024-01-05 ENCOUNTER — Ambulatory Visit (INDEPENDENT_AMBULATORY_CARE_PROVIDER_SITE_OTHER): Admitting: Cardiology

## 2024-01-05 DIAGNOSIS — Z5181 Encounter for therapeutic drug level monitoring: Secondary | ICD-10-CM | POA: Diagnosis not present

## 2024-01-05 DIAGNOSIS — I480 Paroxysmal atrial fibrillation: Secondary | ICD-10-CM | POA: Diagnosis not present

## 2024-01-05 LAB — POCT INR: INR: 2.7 (ref 2.0–3.0)

## 2024-01-05 NOTE — Progress Notes (Signed)
 INR 2.7; Please see anticoagulation encounter

## 2024-01-11 ENCOUNTER — Ambulatory Visit (HOSPITAL_COMMUNITY)
Admission: RE | Admit: 2024-01-11 | Discharge: 2024-01-11 | Disposition: A | Discharge status: Home / Self Care | Source: Ambulatory Visit | Attending: Cardiology | Admitting: Cardiology

## 2024-01-11 ENCOUNTER — Ambulatory Visit: Payer: Self-pay | Admitting: Cardiology

## 2024-01-11 DIAGNOSIS — I420 Dilated cardiomyopathy: Secondary | ICD-10-CM | POA: Diagnosis present

## 2024-01-11 LAB — ECHOCARDIOGRAM COMPLETE
Area-P 1/2: 7.59 cm2
S' Lateral: 3.4 cm

## 2024-01-19 ENCOUNTER — Ambulatory Visit (INDEPENDENT_AMBULATORY_CARE_PROVIDER_SITE_OTHER): Admitting: Cardiology

## 2024-01-19 DIAGNOSIS — Z5181 Encounter for therapeutic drug level monitoring: Secondary | ICD-10-CM

## 2024-01-19 DIAGNOSIS — I480 Paroxysmal atrial fibrillation: Secondary | ICD-10-CM

## 2024-01-19 LAB — POCT INR: INR: 3.5 — AB (ref 2.0–3.0)

## 2024-02-01 ENCOUNTER — Ambulatory Visit: Admitting: Family Medicine

## 2024-02-01 ENCOUNTER — Encounter: Payer: Self-pay | Admitting: Family Medicine

## 2024-02-01 ENCOUNTER — Ambulatory Visit: Payer: Self-pay | Admitting: Family Medicine

## 2024-02-01 VITALS — BP 110/60 | HR 58 | Temp 98.2°F | Wt 261.4 lb

## 2024-02-01 DIAGNOSIS — E119 Type 2 diabetes mellitus without complications: Secondary | ICD-10-CM | POA: Diagnosis not present

## 2024-02-01 DIAGNOSIS — Z7984 Long term (current) use of oral hypoglycemic drugs: Secondary | ICD-10-CM

## 2024-02-01 DIAGNOSIS — Z23 Encounter for immunization: Secondary | ICD-10-CM | POA: Diagnosis not present

## 2024-02-01 LAB — BASIC METABOLIC PANEL WITH GFR
BUN: 17 mg/dL (ref 6–23)
CO2: 26 meq/L (ref 19–32)
Calcium: 9.1 mg/dL (ref 8.4–10.5)
Chloride: 105 meq/L (ref 96–112)
Creatinine, Ser: 0.81 mg/dL (ref 0.40–1.50)
GFR: 95.87 mL/min (ref >60.00)
Glucose, Bld: 113 mg/dL — ABNORMAL HIGH (ref 70–99)
Potassium: 4.2 meq/L (ref 3.5–5.1)
Sodium: 137 meq/L (ref 135–145)

## 2024-02-01 LAB — HEMOGLOBIN A1C: Hgb A1c MFr Bld: 7.2 % — ABNORMAL HIGH (ref 4.6–6.5)

## 2024-02-01 NOTE — Progress Notes (Signed)
   Subjective:    Patient ID: Daniel Grant, male    DOB: 03/15/1966, 58 y.o.   MRN: [id number]  HPI DM- chronic problem.  On Trulicity  0.75mg  weekly, Metformin  XR 1000mg  qAM and 500mg  qPM.  Last A1C 6.8%  UTD on eye exam, due for foot exam, microalbumin.  Pt reports he has not been following his diet and feels A1C will be high.  No CP, SOB, HA's, visual changes, abd pain, N/V.  No numbness/tingling of hands/feet.  No sores or lesions on feet.  Denies symptomatic lows.     Review of Systems For ROS see HPI     Objective:   Physical Exam Vitals reviewed.  Constitutional:      General: He is not in acute distress.    Appearance: Normal appearance. He is well-developed. He is obese. He is not ill-appearing.  HENT:     Head: Normocephalic and atraumatic.  Eyes:     Extraocular Movements: Extraocular movements intact.     Conjunctiva/sclera: Conjunctivae normal.     Pupils: Pupils are equal, round, and reactive to light.  Neck:     Thyroid : No thyromegaly.  Cardiovascular:     Rate and Rhythm: Normal rate and regular rhythm.     Pulses: Normal pulses.     Heart sounds: Normal heart sounds. No murmur heard. Pulmonary:     Effort: Pulmonary effort is normal. No respiratory distress.     Breath sounds: Normal breath sounds.  Abdominal:     General: Bowel sounds are normal. There is no distension.     Palpations: Abdomen is soft.  Musculoskeletal:     Cervical back: Normal range of motion and neck supple.     Right lower leg: No edema.     Left lower leg: No edema.  Lymphadenopathy:     Cervical: No cervical adenopathy.  Skin:    General: Skin is warm and dry.  Neurological:     General: No focal deficit present.     Mental Status: He is alert and oriented to person, place, and time.     Cranial Nerves: No cranial nerve deficit.  Psychiatric:        Mood and Affect: Mood normal.        Behavior: Behavior normal.           Assessment & Plan:

## 2024-02-01 NOTE — Progress Notes (Signed)
 Tried to call patient but got a busy tone. Unable to leave vm

## 2024-02-01 NOTE — Patient Instructions (Signed)
 Schedule your complete physical in 3-4 months We'll notify you of your lab results and make any changes if needed Continue to work on healthy diet and regular exercise- you can do it! Call with any questions or concerns Stay Safe!  Stay Healthy! Happy Fall!!!

## 2024-02-01 NOTE — Assessment & Plan Note (Signed)
 Chronic problem.  Currently on Trulicity  and Metformin .  UTD on eye exam.  Foot exam done today.  Admits he has not been following his diabetes diet and is fearful A1C will be high.  Check labs.  Adjust meds prn

## 2024-02-02 ENCOUNTER — Ambulatory Visit (INDEPENDENT_AMBULATORY_CARE_PROVIDER_SITE_OTHER): Admitting: *Deleted

## 2024-02-02 DIAGNOSIS — I480 Paroxysmal atrial fibrillation: Secondary | ICD-10-CM

## 2024-02-02 DIAGNOSIS — Z5181 Encounter for therapeutic drug level monitoring: Secondary | ICD-10-CM

## 2024-02-02 LAB — POCT INR: INR: 4.3 — AB (ref 2.0–3.0)

## 2024-02-02 NOTE — Patient Instructions (Addendum)
 Description   Call wife at [phone];  INR-4.3; Spoke with pt's wife on DPR, advised for pt not to take any warfarin today and no warfarin tomorrow then START taking warfarin 1/2 tablet daily, except 1 tablet on Mondays and Fridays. Recheck INR in 2 weeks.  Coumadin  Clinic [phone] *Please discuss DOAC option with spouse during next INR check - note on file to discuss after insurance change*  Holding off for now.

## 2024-02-02 NOTE — Progress Notes (Signed)
 Description   Call wife at [phone];  INR-4.3; Spoke with pt's wife on DPR, advised for pt not to take any warfarin today and no warfarin tomorrow then START taking warfarin 1/2 tablet daily, except 1 tablet on Mondays and Fridays. Recheck INR in 2 weeks.  Coumadin  Clinic [phone] *Please discuss DOAC option with spouse during next INR check - note on file to discuss after insurance change*  Holding off for now.

## 2024-02-02 NOTE — Progress Notes (Signed)
 Pt has reviewed labs via MyChart

## 2024-02-07 ENCOUNTER — Other Ambulatory Visit: Payer: Self-pay | Admitting: Family Medicine

## 2024-02-07 DIAGNOSIS — E119 Type 2 diabetes mellitus without complications: Secondary | ICD-10-CM

## 2024-02-15 ENCOUNTER — Ambulatory Visit (HOSPITAL_COMMUNITY)
Admission: RE | Admit: 2024-02-15 | Discharge: 2024-02-15 | Disposition: A | Discharge status: Home / Self Care | Source: Ambulatory Visit | Attending: Physician Assistant | Admitting: Physician Assistant

## 2024-02-15 VITALS — BP 140/80 | HR 65 | Ht 70.0 in | Wt 261.2 lb

## 2024-02-15 DIAGNOSIS — Z5181 Encounter for therapeutic drug level monitoring: Secondary | ICD-10-CM

## 2024-02-15 DIAGNOSIS — Z79899 Other long term (current) drug therapy: Secondary | ICD-10-CM | POA: Diagnosis not present

## 2024-02-15 DIAGNOSIS — D6869 Other thrombophilia: Secondary | ICD-10-CM

## 2024-02-15 DIAGNOSIS — I4891 Unspecified atrial fibrillation: Secondary | ICD-10-CM | POA: Diagnosis not present

## 2024-02-15 DIAGNOSIS — I4819 Other persistent atrial fibrillation: Secondary | ICD-10-CM

## 2024-02-15 NOTE — Progress Notes (Signed)
 Primary Care Physician: Mahlon Comer BRAVO, MD Primary Cardiologist: Dr Pietro Primary Electrophysiologist: Dr Cindie Referring Physician: Dr Cindie Daniel Grant is a 58 y.o. male with a history of DM, HTN, HLD, OSA, atrial fibrillation who presents for follow up in the [facility]. Patient is on warfarin for a CHADS2VASC score of 3. Patient is s/p afib ablation with Dr Cindie on 01/02/22. TEE at the time of ablation showed decreased systolic function with EF 40-45%.  Patient was seen at the ED with neck pain and found to be back in rapid afib. Neck pain was felt to be trap spasm. He was discharged from the ED in afib. Patient has converted back to SR before his follow up on 01/30/22 but went out of rhythm again and is s/p DCCV 07/04/22. He has a Nutritional therapist which showed he was back out of rhythm within 24 hours.   Patient is s/p dofetilide  loading 4/16-4/19/24. He converted with the medication and did not require DCCV.   Patient returns for follow up for atrial fibrillation and dofetilide  monitoring. He reports that he has done well since his last visit. He has periodic episodes of afib detected on his smart watch but is in SR the majority of the time. No bleeding issues on anticoagulation.   Today, he  denies symptoms of palpitations, chest pain, shortness of breath, orthopnea, PND, lower extremity edema, dizziness, presyncope, syncope, snoring, daytime somnolence, bleeding, or neurologic sequela. The patient is tolerating medications without difficulties and is otherwise without complaint today.    Atrial Fibrillation Risk Factors:  he does have symptoms or diagnosis of sleep apnea. he does not have a history of rheumatic fever.   Atrial Fibrillation Management history:  Previous antiarrhythmic drugs: dofetilide   Previous cardioversions: 2022, 07/04/22 Previous ablations: 01/02/22 Anticoagulation history: warfarin    Past Medical History:   Diagnosis Date   DM II (diabetes mellitus, type II), controlled (HCC)    Erectile dysfunction    Hyperlipidemia    Hypertension    Obesity    OSA (obstructive sleep apnea)    Paroxysmal atrial fibrillation (HCC)     Current Outpatient Medications  Medication Sig Dispense Refill   amLODipine -benazepril  (LOTREL) 5-40 MG capsule TAKE 1 CAPSULE BY MOUTH DAILY 90 capsule 3   cetirizine (ZYRTEC) 10 MG tablet Take 10 mg by mouth daily.     diltiazem  (CARDIZEM  CD) 240 MG 24 hr capsule TAKE 1 CAPSULE BY MOUTH DAILY 90 capsule 3   diltiazem  (CARDIZEM ) 30 MG tablet TAKE 1 TABLET BY MOUTH EVERY 4 HOURS AS NEEDED FOR AFIB HEART RATE GREATER THAN 100 AS LONG AS TOP BLOOD PRESSURE GREATER THAN 100 30 tablet 0   dofetilide  (TIKOSYN ) 500 MCG capsule TAKE 1 CAPSULE BY MOUTH TWICE  DAILY 180 capsule 3   Dulaglutide  (TRULICITY ) 0.75 MG/0.5ML SOAJ INJECT 0.75 MG  SUBCUTANEOUSLY ONCE A WEEK 12 mL 0   fluticasone  (FLONASE ) 50 MCG/ACT nasal spray Place 2 sprays into both nostrils daily. (Patient taking differently: Place 2 sprays into both nostrils as needed.) 16 g 5   MAGnesium -Oxide 400 (240 Mg) MG tablet Take 1 tablet by mouth once daily 90 tablet 3   metFORMIN  (GLUCOPHAGE -XR) 500 MG 24 hr tablet TAKE 2 TABLETS BY MOUTH BEFORE  BREAKFAST AND 1 TABLET BY MOUTH  WITH SUPPER 270 tablet 3   metoprolol  succinate (TOPROL -XL) 100 MG 24 hr tablet Take 1 tablet (100 mg total) by mouth daily. WITH OR IMMEDIATELY FOLLOWING  A MEAL 90 tablet 2   Multiple Vitamin (MULTIVITAMIN) tablet Take 1 tablet by mouth daily.     Omega-3 Fatty Acids (FISH OIL  PO) Take 2 capsules by mouth 2 (two) times daily.     pravastatin  (PRAVACHOL ) 40 MG tablet TAKE 1 TABLET BY MOUTH IN THE  EVENING 90 tablet 3   Probiotic Product (PROBIOTIC PO) Take 1 capsule by mouth daily.     sildenafil  (VIAGRA ) 100 MG tablet TAKE 1 TABLET BY MOUTH ONCE DAILY AS NEEDED FOR ERECTILE DYSFUNCTION 10 tablet 0   warfarin (COUMADIN ) 10 MG tablet TAKE 1/2 TO 1  TABLET BY MOUTH  DAILY AS DIRECTED BY COUMADIN   CLINIC 90 tablet 1   No current facility-administered medications for this encounter.    ROS- All systems are reviewed and negative except as per the HPI above.  Physical Exam: Vitals:   02/15/24 1546  BP: (!) 140/80  Pulse: 65  Weight: 118.5 kg  Height: 5' 10 (1.778 m)    GEN: Well nourished, well developed in no acute distress CARDIAC: Regular rate and rhythm, no murmurs, rubs, gallops RESPIRATORY:  Clear to auscultation without rales, wheezing or rhonchi  ABDOMEN: Soft, non-tender, non-distended EXTREMITIES:  No edema; No deformity    Wt Readings from Last 3 Encounters:  02/15/24 118.5 kg  02/01/24 118.6 kg  12/01/23 117.9 kg    EKG today demonstrates  SR Vent. rate 65 BPM PR interval 176 ms QRS duration 90 ms QT/QTcB 408/424 ms   Echo 04/08/22 demonstrated   1. Left ventricular ejection fraction, by estimation, is 60 to 65%. The  left ventricle has normal function. The left ventricle has no regional  wall motion abnormalities. There is mild concentric left ventricular  hypertrophy. Left ventricular diastolic parameters were normal.   2. Right ventricular systolic function is normal. The right ventricular  size is normal. There is normal pulmonary artery systolic pressure.   3. Left atrial size was moderately dilated.   4. No evidence of mitral valve regurgitation.   5. Aortic valve regurgitation is not visualized.   6. The inferior vena cava is normal in size with greater than 50%  respiratory variability, suggesting right atrial pressure of 3 mmHg.   Comparison(s): No significant change from prior study.   Epic records are reviewed at length today  CHA2DS2-VASc Score = 2  The patient's score is based upon: CHF History: 0 (EF normalized) HTN History: 1 Diabetes History: 1 Stroke History: 0 Vascular Disease History: 0 Age Score: 0 Gender Score: 0       ASSESSMENT AND PLAN: Persistent Atrial  Fibrillation (ICD10:  I48.19) The patient's CHA2DS2-VASc score is 2, indicating a 2.2% annual risk of stroke.   S/p afib ablation 12/2021 S/p dofetilide  loading 08/2022 Patient in SR today with brief episodes of afib. Continue dofetilide  500 mcg BID Continue diltiazem  240 mg daily with 30 mg PRN q 4 hours for heart racing.  Continue Toprol  100 mg daily Continue warfarin Apple Watch and Kardia mobile for home monitoring.   Secondary Hypercoagulable State (ICD10:  D68.69) The patient is at significant risk for stroke/thromboembolism based upon his CHA2DS2-VASc Score of 2.  Continue Warfarin (Coumadin ). No bleeding issues.   High Risk Medication Monitoring (ICD 10: J342684) Patient requires ongoing monitoring for anti-arrhythmic medication which has the potential to cause life threatening arrhythmias. QT interval on ECG acceptable for dofetilide  monitoring. Recent bmet reviewed. Check magnesium  today.   Obesity Body mass index is 37.48 kg/m.  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    Daril Kicks PA-C [facility] [address] [place], [place] [address] [phone] 02/15/2024 4:14 PM

## 2024-02-16 ENCOUNTER — Ambulatory Visit (HOSPITAL_COMMUNITY): Payer: Self-pay | Admitting: Physician Assistant

## 2024-02-16 ENCOUNTER — Ambulatory Visit (INDEPENDENT_AMBULATORY_CARE_PROVIDER_SITE_OTHER)

## 2024-02-16 DIAGNOSIS — Z5181 Encounter for therapeutic drug level monitoring: Secondary | ICD-10-CM | POA: Diagnosis not present

## 2024-02-16 DIAGNOSIS — I480 Paroxysmal atrial fibrillation: Secondary | ICD-10-CM | POA: Diagnosis not present

## 2024-02-16 LAB — MAGNESIUM: Magnesium: 1.9 mg/dL (ref 1.6–2.3)

## 2024-02-16 LAB — POCT INR: INR: 2.9 (ref 2.0–3.0)

## 2024-03-01 ENCOUNTER — Ambulatory Visit (INDEPENDENT_AMBULATORY_CARE_PROVIDER_SITE_OTHER): Admitting: Cardiology

## 2024-03-01 DIAGNOSIS — I480 Paroxysmal atrial fibrillation: Secondary | ICD-10-CM

## 2024-03-01 DIAGNOSIS — Z5181 Encounter for therapeutic drug level monitoring: Secondary | ICD-10-CM | POA: Diagnosis not present

## 2024-03-01 LAB — POCT INR: INR: 3.1 — AB (ref 2.0–3.0)

## 2024-03-12 ENCOUNTER — Other Ambulatory Visit: Payer: Self-pay | Admitting: Family Medicine

## 2024-03-12 ENCOUNTER — Other Ambulatory Visit (HOSPITAL_COMMUNITY): Payer: Self-pay | Admitting: Physician Assistant

## 2024-03-12 DIAGNOSIS — N522 Drug-induced erectile dysfunction: Secondary | ICD-10-CM

## 2024-03-15 ENCOUNTER — Ambulatory Visit (INDEPENDENT_AMBULATORY_CARE_PROVIDER_SITE_OTHER): Admitting: *Deleted

## 2024-03-15 DIAGNOSIS — I480 Paroxysmal atrial fibrillation: Secondary | ICD-10-CM

## 2024-03-15 DIAGNOSIS — Z5181 Encounter for therapeutic drug level monitoring: Secondary | ICD-10-CM

## 2024-03-15 LAB — POCT INR: INR: 3 (ref 2.0–3.0)

## 2024-03-15 NOTE — Progress Notes (Signed)
 Description   Call wife at [phone];  INR-3.0; Spoke with pt's wife on DPR, advised for pt to continue taking warfarin 1/2 tablet daily, except 1 tablet on Mondays and Fridays. Recheck INR in 2 weeks. Coumadin  Clinic [phone] *Please discuss DOAC option with spouse during next INR check - note on file to discuss after insurance change*  Holding off for now.

## 2024-03-15 NOTE — Patient Instructions (Signed)
 Description   Call wife at [phone];  INR-3.0; Spoke with pt's wife on DPR, advised for pt to continue taking warfarin 1/2 tablet daily, except 1 tablet on Mondays and Fridays. EAT GREENS TONIGHT;  Recheck INR in 2 weeks.  Coumadin  Clinic [phone] *Please discuss DOAC option with spouse during next INR check - note on file to discuss after insurance change*  Holding off for now.

## 2024-03-26 ENCOUNTER — Other Ambulatory Visit: Payer: Self-pay | Admitting: Physician Assistant

## 2024-03-29 ENCOUNTER — Ambulatory Visit (INDEPENDENT_AMBULATORY_CARE_PROVIDER_SITE_OTHER)

## 2024-03-29 DIAGNOSIS — Z5181 Encounter for therapeutic drug level monitoring: Secondary | ICD-10-CM

## 2024-03-29 DIAGNOSIS — I480 Paroxysmal atrial fibrillation: Secondary | ICD-10-CM | POA: Diagnosis not present

## 2024-03-29 LAB — POCT INR: INR: 1.8 — AB (ref 2.0–3.0)

## 2024-03-29 NOTE — Patient Instructions (Signed)
 Description   Call wife at [phone];  INR-1.8; Spoke with pt's wife on DPR, advised for pt to take 1 tablet today, then continue taking warfarin 1/2 tablet daily, except 1 tablet on Mondays and Fridays. Recheck INR in 2 weeks. Coumadin  Clinic [phone] *Please discuss DOAC option with spouse during next INR check - note on file to discuss after insurance change*  Holding off for now.

## 2024-03-29 NOTE — Progress Notes (Signed)
 Description   Call wife at [phone];  INR-1.8; Spoke with pt's wife on DPR, advised for pt to take 1 tablet today, then continue taking warfarin 1/2 tablet daily, except 1 tablet on Mondays and Fridays. Recheck INR in 2 weeks. Coumadin  Clinic [phone] *Please discuss DOAC option with spouse during next INR check - note on file to discuss after insurance change*  Holding off for now.

## 2024-04-12 ENCOUNTER — Ambulatory Visit (INDEPENDENT_AMBULATORY_CARE_PROVIDER_SITE_OTHER): Admitting: *Deleted

## 2024-04-12 DIAGNOSIS — I480 Paroxysmal atrial fibrillation: Secondary | ICD-10-CM | POA: Diagnosis not present

## 2024-04-12 DIAGNOSIS — Z5181 Encounter for therapeutic drug level monitoring: Secondary | ICD-10-CM

## 2024-04-12 LAB — POCT INR: INR: 2.2 (ref 2.0–3.0)

## 2024-04-12 NOTE — Patient Instructions (Signed)
 Description   Call wife at [phone];  INR-2.2; Spoke with pt's wife on DPR, advised for pt to continue taking warfarin 1/2 tablet daily, except 1 tablet on Mondays and Fridays. Recheck INR in 2 weeks. Coumadin  Clinic [phone] *Please discuss DOAC option with spouse during next INR check - note on file to discuss after insurance change*  Holding off for now.

## 2024-04-12 NOTE — Progress Notes (Signed)
 Description   Call wife at [phone];  INR-2.2; Spoke with pt's wife on DPR, advised for pt to continue taking warfarin 1/2 tablet daily, except 1 tablet on Mondays and Fridays. Recheck INR in 2 weeks. Coumadin  Clinic [phone] *Please discuss DOAC option with spouse during next INR check - note on file to discuss after insurance change*  Holding off for now.

## 2024-04-26 ENCOUNTER — Ambulatory Visit

## 2024-04-26 DIAGNOSIS — I480 Paroxysmal atrial fibrillation: Secondary | ICD-10-CM | POA: Diagnosis not present

## 2024-04-26 DIAGNOSIS — Z5181 Encounter for therapeutic drug level monitoring: Secondary | ICD-10-CM | POA: Diagnosis not present

## 2024-04-26 LAB — POCT INR: INR: 2 (ref 2.0–3.0)

## 2024-05-01 ENCOUNTER — Other Ambulatory Visit: Payer: Self-pay | Admitting: Family Medicine

## 2024-05-01 DIAGNOSIS — E119 Type 2 diabetes mellitus without complications: Secondary | ICD-10-CM

## 2024-05-10 ENCOUNTER — Ambulatory Visit: Admitting: *Deleted

## 2024-05-10 DIAGNOSIS — I480 Paroxysmal atrial fibrillation: Secondary | ICD-10-CM

## 2024-05-10 DIAGNOSIS — Z5181 Encounter for therapeutic drug level monitoring: Secondary | ICD-10-CM

## 2024-05-10 LAB — POCT INR: INR: 2.9 (ref 2.0–3.0)

## 2024-05-10 NOTE — Progress Notes (Signed)
 Description   Call wife at [phone] INR-2.9; Spoke with pt's wife on DPR, advised for patient to continue taking warfarin 1/2 tablet daily, except 1 tablet on Mondays and Fridays. Recheck INR in 2 weeks. Coumadin  Clinic [phone] *Please discuss DOAC option with spouse during next INR check - note on file to discuss after insurance change*  Holding off for now.

## 2024-05-10 NOTE — Patient Instructions (Signed)
 Description   Call wife at [phone] INR-2.9; Spoke with pt's wife on DPR, advised for patient to continue taking warfarin 1/2 tablet daily, except 1 tablet on Mondays and Fridays. Recheck INR in 2 weeks. Coumadin  Clinic [phone] *Please discuss DOAC option with spouse during next INR check - note on file to discuss after insurance change*  Holding off for now.

## 2024-05-15 ENCOUNTER — Other Ambulatory Visit: Payer: Self-pay | Admitting: Physician Assistant

## 2024-05-15 DIAGNOSIS — I480 Paroxysmal atrial fibrillation: Secondary | ICD-10-CM

## 2024-05-24 DIAGNOSIS — Z5181 Encounter for therapeutic drug level monitoring: Secondary | ICD-10-CM

## 2024-05-24 DIAGNOSIS — I480 Paroxysmal atrial fibrillation: Secondary | ICD-10-CM

## 2024-05-24 LAB — POCT INR: INR: 2.1 (ref 2.0–3.0)

## 2024-05-30 ENCOUNTER — Ambulatory Visit: Admitting: Family Medicine

## 2024-05-30 ENCOUNTER — Encounter: Payer: Self-pay | Admitting: Family Medicine

## 2024-05-30 VITALS — BP 115/70 | HR 59 | Wt 266.2 lb

## 2024-05-30 DIAGNOSIS — Z125 Encounter for screening for malignant neoplasm of prostate: Secondary | ICD-10-CM | POA: Diagnosis not present

## 2024-05-30 DIAGNOSIS — E119 Type 2 diabetes mellitus without complications: Secondary | ICD-10-CM

## 2024-05-30 DIAGNOSIS — Z0000 Encounter for general adult medical examination without abnormal findings: Secondary | ICD-10-CM | POA: Diagnosis not present

## 2024-05-30 DIAGNOSIS — Z7984 Long term (current) use of oral hypoglycemic drugs: Secondary | ICD-10-CM

## 2024-05-30 DIAGNOSIS — Z7985 Long-term (current) use of injectable non-insulin antidiabetic drugs: Secondary | ICD-10-CM | POA: Diagnosis not present

## 2024-05-30 DIAGNOSIS — Z1211 Encounter for screening for malignant neoplasm of colon: Secondary | ICD-10-CM

## 2024-05-30 LAB — MICROALBUMIN / CREATININE URINE RATIO
Creatinine,U: 304.8 mg/dL
Microalb Creat Ratio: 27.3 mg/g (ref 0.0–30.0)
Microalb, Ur: 8.3 mg/dL — ABNORMAL HIGH (ref 0.7–1.9)

## 2024-05-30 NOTE — Patient Instructions (Signed)
 Follow up in 3-4 months to recheck sugar We'll notify you of your lab results and make any changes if needed Continue to work on healthy diet and regular exercise- you can do it! Complete and return the cologuard as directed Call with any questions or concerns Stay Safe!  Stay Healthy! Happy New Year!!

## 2024-05-30 NOTE — Progress Notes (Signed)
 "  Subjective:    Patient ID: Daniel Grant, male    DOB: 1966-04-04, 59 y.o.   MRN: [id number]  HPI CPE- due for PNA, Tdap, colonoscopy (on coumadin ), microalbumin.  UTD on eye exam, foot exam, flu shot.  Health Maintenance  Topic Date Due   Diabetic kidney evaluation - Urine ACR  Never done   Pneumococcal Vaccine: 50+ Years (2 of 2 - PCV) 09/15/2013   DTaP/Tdap/Td (2 - Td or Tdap) 09/07/2021   Zoster Vaccines- Shingrix  (2 of 2) 11/29/2021   COVID-19 Vaccine (5 - 2025-26 season) 01/25/2024   Colonoscopy  05/05/2024   HEMOGLOBIN A1C  07/31/2024   OPHTHALMOLOGY EXAM  11/23/2024   Diabetic kidney evaluation - eGFR measurement  01/31/2025   FOOT EXAM  01/31/2025   Influenza Vaccine  Completed   Hepatitis C Screening  Completed   HIV Screening  Completed   Hepatitis B Vaccines 19-59 Average Risk  Aged Out   HPV VACCINES  Aged Out   Meningococcal B Vaccine  Aged Out     Patient Care Team    Relationship Specialty Notifications Start End  Mahlon Comer BRAVO, MD PCP - General Family Medicine  11/28/15   Cindie Ole DASEN, MD PCP - Electrophysiology Cardiology  03/29/21   Pietro Redell RAMAN, MD PCP - Cardiology Cardiology  02/03/22   Morris Debby BIRCH, MD  Cardiology  04/25/11   Sheldon Standing, MD Consulting Physician General Surgery  04/21/16   Pietro Redell RAMAN, MD Consulting Physician Cardiology  04/21/16   Dr Octavia Referring Physician Ophthalmology  10/08/22       Review of Systems Patient reports no vision/hearing changes, anorexia, fever ,adenopathy, persistant/recurrent hoarseness, swallowing issues, chest pain, edema, persistant/recurrent cough, hemoptysis, dyspnea (rest,exertional, paroxysmal nocturnal), gastrointestinal  bleeding (melena, rectal bleeding), abdominal pain, excessive heart burn, GU symptoms (dysuria, hematuria, voiding/incontinence issues) syncope, focal weakness, memory loss, numbness & tingling, skin/hair/nail changes, depression, anxiety, abnormal  bruising/bleeding, musculoskeletal symptoms/signs.   + palpitations    Objective:   Physical Exam General Appearance:    Alert, cooperative, no distress, appears stated age, obese  Head:    Normocephalic, without obvious abnormality, atraumatic  Eyes:    PERRL, conjunctiva/corneas clear, EOM's intact both eyes       Ears:    Normal TM's and external ear canals, both ears  Nose:   Nares normal, septum midline, mucosa normal, no drainage   or sinus tenderness  Throat:   Lips, mucosa, and tongue normal; teeth and gums normal  Neck:   Supple, symmetrical, trachea midline, no adenopathy;       thyroid :  No enlargement/tenderness/nodules  Back:     Symmetric, no curvature, ROM normal, no CVA tenderness  Lungs:     Clear to auscultation bilaterally, respirations unlabored  Chest wall:    No tenderness or deformity  Heart:    Irregular S1 and S2, no murmur, rub   or gallop  Abdomen:     Soft, non-tender, bowel sounds active all four quadrants,    no masses, no organomegaly  Genitalia:    Normal male without lesion, masses,discharge or tenderness  Rectal:    Deferred due to young age  Extremities:   Extremities normal, atraumatic, no cyanosis or edema  Pulses:   2+ and symmetric all extremities  Skin:   Skin color, texture, turgor normal, no rashes or lesions  Lymph nodes:   Cervical, supraclavicular, and axillary nodes normal  Neurologic:   CNII-XII intact. Normal strength, sensation and reflexes  throughout          Assessment & Plan:    "

## 2024-05-31 LAB — HEPATIC FUNCTION PANEL
ALT: 26 U/L (ref 3–53)
AST: 19 U/L (ref 5–37)
Albumin: 4.3 g/dL (ref 3.5–5.2)
Alkaline Phosphatase: 98 U/L (ref 39–117)
Bilirubin, Direct: 0.1 mg/dL (ref 0.1–0.3)
Total Bilirubin: 0.6 mg/dL (ref 0.2–1.2)
Total Protein: 6.7 g/dL (ref 6.0–8.3)

## 2024-05-31 LAB — CBC WITH DIFFERENTIAL/PLATELET
Basophils Absolute: 0.1 K/uL (ref 0.0–0.1)
Basophils Relative: 0.8 % (ref 0.0–3.0)
Eosinophils Absolute: 0.3 K/uL (ref 0.0–0.7)
Eosinophils Relative: 3.1 % (ref 0.0–5.0)
HCT: 43.1 % (ref 39.0–52.0)
Hemoglobin: 14.8 g/dL (ref 13.0–17.0)
Lymphocytes Relative: 25 % (ref 12.0–46.0)
Lymphs Abs: 2.7 K/uL (ref 0.7–4.0)
MCHC: 34.2 g/dL (ref 30.0–36.0)
MCV: 82.8 fl (ref 78.0–100.0)
Monocytes Absolute: 0.9 K/uL (ref 0.1–1.0)
Monocytes Relative: 8.8 % (ref 3.0–12.0)
Neutro Abs: 6.7 K/uL (ref 1.4–7.7)
Neutrophils Relative %: 62.3 % (ref 43.0–77.0)
Platelets: 244 K/uL (ref 150.0–400.0)
RBC: 5.2 Mil/uL (ref 4.22–5.81)
RDW: 13.6 % (ref 11.5–15.5)
WBC: 10.8 K/uL — ABNORMAL HIGH (ref 4.0–10.5)

## 2024-05-31 LAB — BASIC METABOLIC PANEL WITH GFR
BUN: 22 mg/dL (ref 6–23)
CO2: 28 meq/L (ref 19–32)
Calcium: 9.2 mg/dL (ref 8.4–10.5)
Chloride: 105 meq/L (ref 96–112)
Creatinine, Ser: 0.93 mg/dL (ref 0.40–1.50)
GFR: 89.08 mL/min
Glucose, Bld: 80 mg/dL (ref 70–99)
Potassium: 4.5 meq/L (ref 3.5–5.1)
Sodium: 141 meq/L (ref 135–145)

## 2024-05-31 LAB — LIPID PANEL
Cholesterol: 145 mg/dL (ref 28–200)
HDL: 42.1 mg/dL
LDL Cholesterol: 64 mg/dL (ref 10–99)
NonHDL: 102.64
Total CHOL/HDL Ratio: 3
Triglycerides: 194 mg/dL — ABNORMAL HIGH (ref 10.0–149.0)
VLDL: 38.8 mg/dL (ref 0.0–40.0)

## 2024-05-31 LAB — HEMOGLOBIN A1C: Hgb A1c MFr Bld: 6.8 % — ABNORMAL HIGH (ref 4.6–6.5)

## 2024-05-31 LAB — PSA: PSA: 1.05 ng/mL (ref 0.10–4.00)

## 2024-05-31 LAB — TSH: TSH: 0.85 u[IU]/mL (ref 0.35–5.50)

## 2024-06-02 ENCOUNTER — Ambulatory Visit: Payer: Self-pay | Admitting: Family Medicine

## 2024-06-07 ENCOUNTER — Ambulatory Visit

## 2024-06-07 ENCOUNTER — Telehealth: Payer: Self-pay | Admitting: *Deleted

## 2024-06-07 DIAGNOSIS — Z5181 Encounter for therapeutic drug level monitoring: Secondary | ICD-10-CM

## 2024-06-07 DIAGNOSIS — I480 Paroxysmal atrial fibrillation: Secondary | ICD-10-CM | POA: Diagnosis not present

## 2024-06-07 LAB — POCT INR: INR: 2.7 (ref 2.0–3.0)

## 2024-06-07 NOTE — Telephone Encounter (Signed)
 Spoke with wife and she states she will perform the patient's INR testing later. Will await and follow up.

## 2024-06-12 ENCOUNTER — Other Ambulatory Visit: Payer: Self-pay | Admitting: Cardiology

## 2024-06-12 DIAGNOSIS — I480 Paroxysmal atrial fibrillation: Secondary | ICD-10-CM

## 2024-06-15 LAB — COLOGUARD: COLOGUARD: NEGATIVE

## 2024-06-16 NOTE — Progress Notes (Signed)
 Lab results have been discussed.   Verbalized understanding? Yes  Are there any questions? No

## 2024-06-21 ENCOUNTER — Ambulatory Visit (INDEPENDENT_AMBULATORY_CARE_PROVIDER_SITE_OTHER): Admitting: *Deleted

## 2024-06-21 DIAGNOSIS — I480 Paroxysmal atrial fibrillation: Secondary | ICD-10-CM | POA: Diagnosis not present

## 2024-06-21 DIAGNOSIS — Z5181 Encounter for therapeutic drug level monitoring: Secondary | ICD-10-CM | POA: Diagnosis not present

## 2024-06-21 LAB — POCT INR: INR: 3.2 — AB (ref 2.0–3.0)

## 2024-06-21 NOTE — Patient Instructions (Addendum)
 Description   INR-3.2; Call wife at [phone] Spoke with pt's wife on DPR, advised for patient to not take any warfarin today then continue taking warfarin 1/2 tablet daily, except 1 tablet on Mondays and Fridays. Recheck INR in 2 weeks. Coumadin  Clinic [phone] *Please discuss DOAC option with spouse during next INR check - note on file to discuss after insurance change*  Holding off for now.

## 2024-06-21 NOTE — Progress Notes (Signed)
 Description   INR-3.2; Call wife at [phone] Spoke with pt's wife on DPR, advised for patient to not take any warfarin today then continue taking warfarin 1/2 tablet daily, except 1 tablet on Mondays and Fridays. Recheck INR in 2 weeks. Coumadin  Clinic [phone] *Please discuss DOAC option with spouse during next INR check - note on file to discuss after insurance change*  Holding off for now.

## 2024-06-25 NOTE — Assessment & Plan Note (Signed)
 Pt's PE WNL w/ exception of BMI and known Afib.  UTD on eye exam, foot exam, flu shot.  Tdap and PNA given.  Microalbumin ordered.  Colonoscopy postponed do to coumadin .  Check labs.  Anticipatory guidance provided.

## 2024-06-29 ENCOUNTER — Ambulatory Visit: Admitting: Emergency Medicine

## 2024-06-29 ENCOUNTER — Encounter: Payer: Self-pay | Admitting: Emergency Medicine

## 2024-06-29 VITALS — BP 126/76 | HR 93 | Wt 261.6 lb

## 2024-06-29 DIAGNOSIS — I10 Essential (primary) hypertension: Secondary | ICD-10-CM | POA: Diagnosis not present

## 2024-06-29 DIAGNOSIS — E785 Hyperlipidemia, unspecified: Secondary | ICD-10-CM | POA: Diagnosis not present

## 2024-06-29 DIAGNOSIS — G4733 Obstructive sleep apnea (adult) (pediatric): Secondary | ICD-10-CM | POA: Diagnosis not present

## 2024-06-29 DIAGNOSIS — I4819 Other persistent atrial fibrillation: Secondary | ICD-10-CM

## 2024-06-29 DIAGNOSIS — I420 Dilated cardiomyopathy: Secondary | ICD-10-CM | POA: Diagnosis not present

## 2024-06-29 NOTE — Patient Instructions (Signed)
 Medication Instructions:  No medication changes were made during today's visit.  *If you need a refill on your cardiac medications before your next appointment, please call your pharmacy*  Lab Work: No labs were ordered during today's visit.  If you have labs (blood work) drawn today and your tests are completely normal, you will receive your results only by: MyChart Message (if you have MyChart) OR A paper copy in the mail If you have any lab test that is abnormal or we need to change your treatment, we will call you to review the results.  Testing/Procedures: No procedures were ordered during today's visit.   Follow-Up: At [facility], you and your health needs are our priority.  As part of our continuing mission to provide you with exceptional heart care, our providers are all part of one team.  This team includes your primary Cardiologist (physician) and Advanced Practice Providers or APPs (Physician Assistants and Nurse Practitioners) who all work together to provide you with the care you need, when you need it.  Your next appointment:   6 month(s)  Provider:   Redell Shallow, MD    We recommend signing up for the patient portal called MyChart.  Sign up information is provided on this After Visit Summary.  MyChart is used to connect with patients for Virtual Visits (Telemedicine).  Patients are able to view lab/test results, encounter notes, upcoming appointments, etc.  Non-urgent messages can be sent to your provider as well.   To learn more about what you can do with MyChart, go to [email or web address].   Other Instructions

## 2024-06-29 NOTE — Progress Notes (Signed)
 " Cardiology Office Note:    Date:  06/29/2024  ID:  Daniel Grant, DOB [birth year], MRN [id number] PCP: Mahlon Comer BRAVO, MD  [facility] Providers Cardiologist:  Redell Shallow, MD Electrophysiologist:  OLE ONEIDA HOLTS, MD (Inactive)       Patient Profile:       Chief Complaint: 45-month follow-up History of Present Illness:  Daniel Grant is a 59 y.o. male with visit-pertinent history of atrial fibrillation, HFrecEF, hypertension, obstructive sleep apnea, obesity  Cardiac catheterization in January 2001 showed normal LV function and normal coronary arteries.  Nuclear study in June 2011 showed soft tissue attenuation and no ischemia.  Patient underwent atrial fibrillation ablation in August 2023.  He underwent TEE prior to ablation showing ejection fraction of 40 to 45%, mild RV dysfunction, moderate left atrial enlargement, mild to moderate right atrial enlargement and mild mitral regurgitation.  Echocardiogram in November 2023 showed normal LV function, mild LVH, moderate left atrial enlargement.  He underwent DCCV on 06/2022 and per his Kardia mobile device showed he was back out of rhythm within 24 hours. Patient is s/p dofetilide  loading 4/16-4/19/24. He converted with the medication and did not require DCCV.   Last seen by atrial fibrillation clinic on 02/15/2024.  Patient noted to be in sinus rhythm the majority of the time.  He was in sinus rhythm in office.  He was continued on dofetilide , diltiazem , and metoprolol .   Discussed the use of AI scribe software for clinical note transcription with the patient, who gave verbal consent to proceed.  History of Present Illness Daniel Grant is a 59 year old male with atrial fibrillation who presents for a 51-month follow-up and DOT evaluation.  He has paroxysmal atrial fibrillation and underwent an ablation in 2023. He still feels episodes of his rhythm going in and out and monitors his rhythm with a Kardia device.  He is  treated with Tikosyn , Cardizem , and metoprolol  and prefers continued medical management rather than a second ablation.  He uses CPAP nightly for 6 to 7 hours as required for DOT. He has no chest pain, shortness of breath, weight gain, or syncope. He takes Lotrel for blood pressure, weekly Trulicity  for diabetes, and Coumadin  with home INR monitoring using his own device.  He works as a naval architect and remains physically active through work and outdoor activities. He is trying to improve his weight.   Review of systems:  Please see the history of present illness. All other systems are reviewed and otherwise negative.      Studies Reviewed:        Echocardiogram 01/11/2024  1. Left ventricular ejection fraction, by estimation, is 60 to 65%. The  left ventricle has normal function. The left ventricle has no regional  wall motion abnormalities. There is mild concentric left ventricular  hypertrophy. Left ventricular diastolic  parameters are consistent with Grade I diastolic dysfunction (impaired  relaxation).   2. Right ventricular systolic function is normal. The right ventricular  size is normal.   3. The mitral valve is normal in structure. No evidence of mitral valve  regurgitation. No evidence of mitral stenosis.   4. The aortic valve is tricuspid. Aortic valve regurgitation is not  visualized. No aortic stenosis is present.   5. The inferior vena cava is normal in size with greater than 50%  respiratory variability, suggesting right atrial pressure of 3 mmHg.   Echocardiogram 04/08/2022 1. Left ventricular ejection fraction, by estimation, is 60 to 65%.  The  left ventricle has normal function. The left ventricle has no regional  wall motion abnormalities. There is mild concentric left ventricular  hypertrophy. Left ventricular diastolic  parameters were normal.   2. Right ventricular systolic function is normal. The right ventricular  size is normal. There is normal pulmonary  artery systolic pressure.   3. Left atrial size was moderately dilated.   4. No evidence of mitral valve regurgitation.   5. Aortic valve regurgitation is not visualized.   6. The inferior vena cava is normal in size with greater than 50%  respiratory variability, suggesting right atrial pressure of 3 mmHg.   TEE 01/02/2022 1. Left ventricular ejection fraction, by estimation, is 40 to 45%. The  left ventricle has mildly decreased function. The left ventricle has no  regional wall motion abnormalities.   2. Right ventricular systolic function is mildly reduced. The right  ventricular size is normal.   3. Left atrial size was moderately dilated. No left atrial/left atrial  appendage thrombus was detected.   4. Right atrial size was mild to moderately dilated.   5. The mitral valve is normal in structure. Mild mitral valve  regurgitation. No evidence of mitral stenosis.   6. The aortic valve is normal in structure. Aortic valve regurgitation is  not visualized. No aortic stenosis is present.   7. There is mild (Grade II) plaque involving the descending aorta.   8. The inferior vena cava is normal in size with greater than 50%  respiratory variability, suggesting right atrial pressure of 3 mmHg.  Risk Assessment/Calculations:    CHA2DS2-VASc Score = 2   This indicates a 2.2% annual risk of stroke. The patient's score is based upon: CHF History: 0 (EF normalized) HTN History: 1 Diabetes History: 1 Stroke History: 0 Vascular Disease History: 0 Age Score: 0 Gender Score: 0             Physical Exam:   VS:  BP 126/76   Pulse 93   Wt 261 lb 9.6 oz (118.7 kg)   SpO2 96%   BMI 37.54 kg/m    Wt Readings from Last 3 Encounters:  06/29/24 261 lb 9.6 oz (118.7 kg)  05/30/24 266 lb 3.2 oz (120.7 kg)  02/15/24 261 lb 3.2 oz (118.5 kg)    GEN: Well nourished, well developed in no acute distress NECK: No JVD; No carotid bruits CARDIAC: RRR, no murmurs, rubs, gallops RESPIRATORY:   Clear to auscultation without rales, wheezing or rhonchi  ABDOMEN: Soft, non-tender, non-distended EXTREMITIES:  No edema; No acute deformity      Assessment and Plan:  HFrecEF Previously noted with LV dysfunction of 45% at the time of A-fib ablation in 2023 with a normalization on subsequent echocardiogram Most recent echocardiogram 12/2023 showed LVEF 60 to 65%, no RWMA, mild LVH, grade 1 DD, RV function size normal - Today he appears euvolemic and well compensated on exam without dyspnea, orthopnea, PND, weight gain, LEE - No changes.  Continue current medication regimen  Persistent atrial fibrillation S/p A-fib ablation on 12/2021 S/p dofetilide  loading 08/2022  - Today patient appears to maintain sinus rhythm by auscultation - Low burden of A-fib on Tikosyn  - He brings in his Kardia mobile showing paroxysmal AF but has maintained sinus rhythm over the past week with low burden of A-fib - He previously deferred a repeat ablation  - He will follow back up with A-fib clinic on 07/2024 - Continue diltiazem  240 mg daily, Tikosyn  500 mcg twice daily,  and metoprolol  succinate 100 mg daily - Continue Coumadin  with home INR checks, no bleeding concerns  Hypertension Blood pressure today is well-controlled at 126/76 - Continue amlodipine -benazepril  5-40 mg daily, diltiazem  240 mg daily, and metoprolol  succinate 100 mg daily  Obstructive sleep apnea - Remains adherent to CPAP therapy  Hyperlipidemia LDL 64 in 05/2024 and well-controlled - Continue pravastatin  40 mg daily      Dispo:  No follow-ups on file.  Signed, Lum LITTIE Louis, NP  "

## 2024-08-08 ENCOUNTER — Ambulatory Visit (HOSPITAL_COMMUNITY): Admitting: Physician Assistant

## 2024-09-29 ENCOUNTER — Ambulatory Visit: Admitting: Family Medicine
